# Patient Record
Sex: Female | Born: 1940
Health system: Southern US, Community
[De-identification: ages and names within clinical notes are randomized; demographics above are authoritative.]

## PROBLEM LIST (undated history)

## (undated) DIAGNOSIS — C801 Malignant (primary) neoplasm, unspecified: Secondary | ICD-10-CM

## (undated) DIAGNOSIS — I8390 Asymptomatic varicose veins of unspecified lower extremity: Secondary | ICD-10-CM

## (undated) DIAGNOSIS — F419 Anxiety disorder, unspecified: Secondary | ICD-10-CM

## (undated) DIAGNOSIS — R519 Headache, unspecified: Secondary | ICD-10-CM

## (undated) DIAGNOSIS — D1720 Benign lipomatous neoplasm of skin and subcutaneous tissue of unspecified limb: Secondary | ICD-10-CM

## (undated) DIAGNOSIS — Z789 Other specified health status: Secondary | ICD-10-CM

## (undated) DIAGNOSIS — F32A Depression, unspecified: Secondary | ICD-10-CM

## (undated) HISTORY — PX: WISDOM TOOTH EXTRACTION: SHX21

## (undated) HISTORY — PX: GANGLION CYST EXCISION: SHX1691

## (undated) HISTORY — DX: Asymptomatic varicose veins of unspecified lower extremity: I83.90

## (undated) HISTORY — DX: Benign lipomatous neoplasm of skin and subcutaneous tissue of unspecified limb: D17.20

## (undated) HISTORY — PX: COLONOSCOPY: SHX174

---
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
## 1986-02-07 HISTORY — PX: CHOLECYSTECTOMY: SHX55

## 1997-11-14 ENCOUNTER — Ambulatory Visit (HOSPITAL_COMMUNITY): Admission: RE | Admit: 1997-11-14 | Discharge: 1997-11-14 | Payer: Self-pay | Admitting: Obstetrics & Gynecology

## 2003-08-07 ENCOUNTER — Other Ambulatory Visit: Admission: RE | Admit: 2003-08-07 | Discharge: 2003-08-07 | Payer: Self-pay | Admitting: Internal Medicine

## 2004-07-28 ENCOUNTER — Ambulatory Visit: Payer: Self-pay | Admitting: Internal Medicine

## 2004-07-30 ENCOUNTER — Ambulatory Visit (HOSPITAL_COMMUNITY): Admission: RE | Admit: 2004-07-30 | Discharge: 2004-07-30 | Payer: Self-pay | Admitting: Internal Medicine

## 2004-07-30 ENCOUNTER — Encounter: Payer: Self-pay | Admitting: Cardiology

## 2004-08-09 ENCOUNTER — Ambulatory Visit: Payer: Self-pay | Admitting: Internal Medicine

## 2004-08-12 ENCOUNTER — Ambulatory Visit (HOSPITAL_COMMUNITY): Admission: RE | Admit: 2004-08-12 | Discharge: 2004-08-12 | Payer: Self-pay | Admitting: Internal Medicine

## 2005-01-05 ENCOUNTER — Ambulatory Visit: Payer: Self-pay | Admitting: Internal Medicine

## 2005-08-15 ENCOUNTER — Ambulatory Visit: Payer: Self-pay | Admitting: Internal Medicine

## 2005-12-22 ENCOUNTER — Ambulatory Visit: Payer: Self-pay | Admitting: Internal Medicine

## 2006-03-07 ENCOUNTER — Ambulatory Visit: Payer: Self-pay | Admitting: Internal Medicine

## 2006-08-18 ENCOUNTER — Ambulatory Visit: Payer: Self-pay | Admitting: Internal Medicine

## 2006-08-18 LAB — CONVERTED CEMR LAB
Calcium, Total (PTH): 9.7 mg/dL (ref 8.4–10.5)
PTH: 31.7 pg/mL (ref 14.0–72.0)
Vit D, 1,25-Dihydroxy: 59 — ABNORMAL HIGH (ref 20–57)

## 2006-09-01 DIAGNOSIS — I868 Varicose veins of other specified sites: Secondary | ICD-10-CM | POA: Insufficient documentation

## 2006-09-01 DIAGNOSIS — M810 Age-related osteoporosis without current pathological fracture: Secondary | ICD-10-CM | POA: Insufficient documentation

## 2007-01-09 ENCOUNTER — Ambulatory Visit: Payer: Self-pay | Admitting: Internal Medicine

## 2007-02-14 ENCOUNTER — Ambulatory Visit: Payer: Self-pay | Admitting: Internal Medicine

## 2007-02-14 DIAGNOSIS — E559 Vitamin D deficiency, unspecified: Secondary | ICD-10-CM

## 2007-03-05 ENCOUNTER — Telehealth: Payer: Self-pay | Admitting: Internal Medicine

## 2007-06-21 ENCOUNTER — Encounter: Payer: Self-pay | Admitting: Internal Medicine

## 2007-08-24 ENCOUNTER — Ambulatory Visit: Payer: Self-pay | Admitting: Internal Medicine

## 2007-08-24 DIAGNOSIS — R634 Abnormal weight loss: Secondary | ICD-10-CM | POA: Insufficient documentation

## 2007-11-22 ENCOUNTER — Ambulatory Visit: Payer: Self-pay | Admitting: Internal Medicine

## 2007-11-22 DIAGNOSIS — J0100 Acute maxillary sinusitis, unspecified: Secondary | ICD-10-CM | POA: Insufficient documentation

## 2007-12-10 ENCOUNTER — Ambulatory Visit: Payer: Self-pay | Admitting: Internal Medicine

## 2008-06-12 ENCOUNTER — Ambulatory Visit: Payer: Self-pay | Admitting: Internal Medicine

## 2008-06-12 LAB — CONVERTED CEMR LAB
Basophils Absolute: 0 10*3/uL (ref 0.0–0.1)
Chloride: 105 meq/L (ref 96–112)
Eosinophils Absolute: 0.2 10*3/uL (ref 0.0–0.7)
Eosinophils Relative: 3 % (ref 0.0–5.0)
HCT: 42.2 % (ref 36.0–46.0)
MCHC: 34.3 g/dL (ref 30.0–36.0)
Monocytes Relative: 11.6 % (ref 3.0–12.0)
Neutro Abs: 3 10*3/uL (ref 1.4–7.7)
Neutrophils Relative %: 55.3 % (ref 43.0–77.0)
Platelets: 156 10*3/uL (ref 150.0–400.0)
Potassium: 4.2 meq/L (ref 3.5–5.1)
RDW: 12.2 % (ref 11.5–14.6)
WBC: 5.4 10*3/uL (ref 4.5–10.5)

## 2008-06-19 ENCOUNTER — Ambulatory Visit: Payer: Self-pay | Admitting: Internal Medicine

## 2008-06-27 ENCOUNTER — Encounter: Payer: Self-pay | Admitting: Internal Medicine

## 2008-11-05 ENCOUNTER — Ambulatory Visit: Payer: Self-pay | Admitting: Internal Medicine

## 2008-12-26 ENCOUNTER — Ambulatory Visit: Payer: Self-pay | Admitting: Internal Medicine

## 2008-12-26 DIAGNOSIS — R209 Unspecified disturbances of skin sensation: Secondary | ICD-10-CM | POA: Insufficient documentation

## 2009-01-05 ENCOUNTER — Telehealth: Payer: Self-pay | Admitting: Internal Medicine

## 2009-01-05 ENCOUNTER — Ambulatory Visit: Payer: Self-pay | Admitting: Internal Medicine

## 2009-01-05 ENCOUNTER — Encounter: Payer: Self-pay | Admitting: Internal Medicine

## 2009-01-21 ENCOUNTER — Ambulatory Visit: Payer: Self-pay | Admitting: Internal Medicine

## 2009-02-16 ENCOUNTER — Telehealth: Payer: Self-pay | Admitting: Internal Medicine

## 2009-04-30 ENCOUNTER — Ambulatory Visit: Payer: Self-pay | Admitting: Internal Medicine

## 2009-06-29 ENCOUNTER — Encounter: Payer: Self-pay | Admitting: Internal Medicine

## 2009-10-28 ENCOUNTER — Ambulatory Visit: Payer: Self-pay | Admitting: Internal Medicine

## 2009-10-28 LAB — CONVERTED CEMR LAB
BUN: 19 mg/dL (ref 6–23)
Bilirubin, Direct: 0.1 mg/dL (ref 0.0–0.3)
Calcium: 9.1 mg/dL (ref 8.4–10.5)
Creatinine, Ser: 0.7 mg/dL (ref 0.4–1.2)
Eosinophils Relative: 1.4 % (ref 0.0–5.0)
Glucose, Bld: 76 mg/dL (ref 70–99)
HDL: 55.4 mg/dL (ref >39.00)
Hemoglobin, Urine: NEGATIVE
Ketones, ur: NEGATIVE mg/dL
Lymphs Abs: 1.7 10*3/uL (ref 0.7–4.0)
MCHC: 34 g/dL (ref 30.0–36.0)
MCV: 94.1 fL (ref 78.0–100.0)
Monocytes Absolute: 0.6 10*3/uL (ref 0.1–1.0)
Neutrophils Relative %: 63 % (ref 43.0–77.0)
Nitrite: NEGATIVE
Platelets: 156 10*3/uL (ref 150.0–400.0)
Potassium: 4.1 meq/L (ref 3.5–5.1)
RBC: 4.35 M/uL (ref 3.87–5.11)
Specific Gravity, Urine: 1.02 (ref 1.000–1.030)
TSH: 1.69 microintl units/mL (ref 0.35–5.50)
Total CHOL/HDL Ratio: 4
Triglycerides: 58 mg/dL (ref 0.0–149.0)
Urine Glucose: NEGATIVE mg/dL
Urobilinogen, UA: 0.2 (ref 0.0–1.0)

## 2009-11-04 ENCOUNTER — Ambulatory Visit: Payer: Self-pay | Admitting: Internal Medicine

## 2009-11-04 DIAGNOSIS — D179 Benign lipomatous neoplasm, unspecified: Secondary | ICD-10-CM | POA: Insufficient documentation

## 2009-12-15 ENCOUNTER — Encounter: Payer: Self-pay | Admitting: Internal Medicine

## 2010-03-07 LAB — CONVERTED CEMR LAB
AST: 21 units/L (ref 0–37)
Alkaline Phosphatase: 52 units/L (ref 39–117)
Basophils Relative: 0.8 % (ref 0.0–3.0)
Bilirubin, Direct: 0.1 mg/dL (ref 0.0–0.3)
Chloride: 105 meq/L (ref 96–112)
Eosinophils Absolute: 0.1 10*3/uL (ref 0.0–0.7)
Eosinophils Relative: 0.9 % (ref 0.0–5.0)
GFR calc non Af Amer: 87.9 mL/min (ref >60)
Glucose, Bld: 84 mg/dL (ref 70–99)
HCT: 42.3 % (ref 36.0–46.0)
HDL: 58.5 mg/dL (ref >39.00)
Lymphs Abs: 1.9 10*3/uL (ref 0.7–4.0)
Monocytes Absolute: 0.6 10*3/uL (ref 0.1–1.0)
Monocytes Relative: 10 % (ref 3.0–12.0)
Neutrophils Relative %: 58.3 % (ref 43.0–77.0)
Nitrite: NEGATIVE
Sodium: 142 meq/L (ref 135–145)
Specific Gravity, Urine: 1.025 (ref 1.000–1.030)
Total Protein, Urine: NEGATIVE mg/dL
Total Protein: 7.3 g/dL (ref 6.0–8.3)
Urine Glucose: NEGATIVE mg/dL
Urobilinogen, UA: 0.2 (ref 0.0–1.0)
VLDL: 10.8 mg/dL (ref 0.0–40.0)

## 2010-03-11 NOTE — Progress Notes (Signed)
Summary: ACTONEL  Phone Note Call from Patient   Summary of Call: Patient is requesting to continue actonel 35mg  once wkly and then start actonel 150mg  once mthly.  Initial call taken by: Lamar Sprinkles, CMA,  February 16, 2009 11:50 AM  Follow-up for Phone Call        OK Follow-up by: Tresa Garter MD,  February 16, 2009 12:08 PM    New/Updated Medications: ACTONEL 35 MG TABS (RISEDRONATE SODIUM) 1 qwk x 2 wks then start actonel 150 Prescriptions: ACTONEL 35 MG TABS (RISEDRONATE SODIUM) 1 qwk x 2 wks then start actonel 150  #2 x 0   Entered by:   Lamar Sprinkles, CMA   Authorized by:   Tresa Garter MD   Signed by:   Lamar Sprinkles, CMA on 02/16/2009   Method used:   Electronically to        [facility]. [phone]* (retail)       [phone]       [place], [place]  [address]       Ph: [phone]       Fax: [phone]   RxID:   [id number] ACTONEL 150 MG TABS (RISEDRONATE SODIUM) 1 by mouth q 1 month for osteoporosis  #3 x 3   Entered by:   Lamar Sprinkles, CMA   Authorized by:   Tresa Garter MD   Signed by:   Lamar Sprinkles, CMA on 02/16/2009   Method used:   Electronically to        [facility]. [phone]* (retail)       [phone]       [place], [place]  [address]       Ph: [phone]       Fax: [phone]   RxID:   [id number]

## 2010-03-11 NOTE — Letter (Signed)
Summary: Consuela Mimes MD  Consuela Mimes MD   Imported By: Sherian Rein 01/25/2010 12:38:32  _____________________________________________________________________  External Attachment:    Type:   Image     Comment:   External Document

## 2010-03-11 NOTE — Assessment & Plan Note (Signed)
Summary: RS BUMP--PT RET FROM [facility] 04/23/09  D/T  STC   Vital Signs:  Patient profile:   70 year old female Weight:      114 pounds BMI:     19.64 Temp:     97 degrees F oral Pulse rate:   82 / minute BP sitting:   118 / 86  (left arm)  Vitals Entered By: Tora Perches (April 30, 2009 8:03 AM) CC: f/u Is Patient Diabetic? No   CC:  f/u.  History of Present Illness: F/u osteoporosis, Vit D def  Preventive Screening-Counseling & Management  Alcohol-Tobacco     Smoking Status: never  Current Medications (verified): 1)  Vitamin D3 1000 Unit  Tabs (Cholecalciferol) .... 2 By Mouth Daily 2)  Actonel 150 Mg Tabs (Risedronate Sodium) .Marland Kitchen.. 1 By Mouth Q 1 Month For Osteoporosis 3)  Prascion Ra 10-5 % Crea (Sulfacetamide-Sulfur-Sunscreen)  Allergies: 1)  ! Fosamax (Alendronate Sodium) 2)  ! Actonel (Risedronate Sodium)  Past History:  Past Medical History: Last updated: 12/26/2008 Osteoporosis Vit D def Varic. veins [facility] in [place] for colonoscopy  Social History: Last updated: 11/22/2007 Married Never Smoked Alcohol use-no Drug use-no Regular exercise-yes  Physical Exam  General:  Well-developed,well-nourished,in no acute distress; alert,appropriate and cooperative throughout examination Mouth:  Oral mucosa and oropharynx without lesions or exudates.  Teeth in good repair. Lungs:  Normal respiratory effort, chest expands symmetrically. Lungs are clear to auscultation, no crackles or wheezes. Heart:  Normal rate and regular rhythm. S1 and S2 normal without gallop, murmur, click, rub or other extra sounds. Extremities:  No clubbing, cyanosis, edema, or deformity noted with normal full range of motion of all joints.   Neurologic:  No cranial nerve deficits noted. Station and gait are normal. Plantar reflexes are down-going bilaterally. DTRs are symmetrical throughout. Sensory, motor and coordinative functions appear intact. Skin:  Intact without suspicious  lesions or rashes Psych:  Cognition and judgment appear intact. Alert and cooperative with normal attention span and concentration. No apparent delusions, illusions, hallucinations   Impression & Recommendations:  Problem # 1:  OSTEOPOROSIS (ICD-733.00) Assessment Comment Only  Her updated medication list for this problem includes:    Actonel 150 Mg Tabs (Risedronate sodium) .Marland Kitchen... 1 by mouth q 1 month for osteoporosis  Problem # 2:  VITAMIN D DEFICIENCY (ICD-268.9) Assessment: Comment Only On prescription therapy   Complete Medication List: 1)  Vitamin D3 1000 Unit Tabs (Cholecalciferol) .... 2 by mouth daily 2)  Actonel 150 Mg Tabs (Risedronate sodium) .Marland Kitchen.. 1 by mouth q 1 month for osteoporosis 3)  Prascion Ra 10-5 % Crea (Sulfacetamide-sulfur-sunscreen)  Patient Instructions: 1)  Please schedule a follow-up appointment in 6 months well w/labs. Prescriptions: ACTONEL 150 MG TABS (RISEDRONATE SODIUM) 1 by mouth q 1 month for osteoporosis  #1 x 12   Entered and Authorized by:   Tresa Garter MD   Signed by:   Tresa Garter MD on 04/30/2009   Method used:   Electronically to        [facility]. [phone]* (retail)       [phone]       [place], [place]  [address]       Ph: [phone]       Fax: [phone]   RxID:   [id number]

## 2010-03-11 NOTE — Assessment & Plan Note (Signed)
Summary: 6 MO ROV /NWS  #--PER PT FLU VAC ALSO--STC   Vital Signs:  Patient profile:   70 year old female Height:      64 inches Weight:      114 pounds BMI:     19.64 Temp:     98.4 degrees F oral Pulse rate:   84 / minute Pulse rhythm:   regular Resp:     16 per minute BP sitting:   110 / 62  (left arm) Cuff size:   regular  Vitals Entered By: Lanier Prude, CMA(AAMA) (November 04, 2009 9:01 AM) CC: 6 mo f/u Is Patient Diabetic? No   CC:  6 mo f/u.  History of Present Illness: The patient presents for a follow up of osteoporosis The patient presents for a preventive health examination  Patient past medical history, social history, and family history reviewed in detail no significant changes.  Patient is physically active. Depression is negative and mood is good. Hearing is normal, and able to perform activities of daily living. Risk of falling is negligible and home safety has been reviewed and is appropriate. Patient has normal height, weight, and visual acuity. Patient has been counseled on age-appropriate routine health concerns for screening and prevention. Education, counseling done.   Preventive Screening-Counseling & Management  Alcohol-Tobacco     Alcohol drinks/day: 0     Smoking Status: never     Smoking Cessation Counseling: no  Caffeine-Diet-Exercise     Caffeine Counseling: not indicated; caffeine use is not excessive or problematic     Does Patient Exercise: no  Hep-HIV-STD-Contraception     Hepatitis Risk: no risk noted     Sun Exposure-Excessive: no  Safety-Violence-Falls     Seat Belt Use: no     Fall Risk Counseling: not indicated; no significant falls noted      Sexual History:  currently monogamous.    Current Medications (verified): 1)  Vitamin D3 1000 Unit  Tabs (Cholecalciferol) .... 2 By Mouth Daily 2)  Actonel 150 Mg Tabs (Risedronate Sodium) .Marland Kitchen.. 1 By Mouth Q 1 Month For Osteoporosis 3)  Prascion Ra 10-5 % Crea  (Sulfacetamide-Sulfur-Sunscreen) 4)  Ocuvite-Lutein  Caps (Multiple Vitamins-Minerals) .Marland Kitchen.. 1 Every Morning 5)  Caltrate 600+d 600-400 Mg-Unit Tabs (Calcium Carbonate-Vitamin D) .Marland Kitchen.. 1 Once Daily  Allergies (verified): 1)  ! Fosamax (Alendronate Sodium) 2)  Actonel (Risedronate Sodium)  Past History:  Family History: Last updated: 11/04/2009 Varic veins M is 39  Social History: Last updated: 11/04/2009 Married Never Smoked Alcohol use-no Drug use-no Regular exercise-no  Past Medical History: Osteoporosis Vit D def Varic. veins [facility] in [place] for colonoscopy R arm lipoma  Past Surgical History: Reviewed history from 11/22/2007 and no changes required. Denies surgical history  Family History: Varic veins M is 95  Social History: Reviewed history from 11/22/2007 and no changes required. Married Never Smoked Alcohol use-no Drug use-no Regular exercise-no Does Patient Exercise:  no Hepatitis Risk:  no risk noted Sun Exposure-Excessive:  no Seat Belt Use:  no Sexual History:  currently monogamous  Review of Systems  The patient denies fever, chest pain, and dyspnea on exertion.    Physical Exam  Msk:  R mid arm lat 4 cm subcutaneouse mass   Impression & Recommendations:  Problem # 1:  PHYSICAL EXAMINATION (ICD-V70.0) Assessment New  The labs were reviewed with the patient.  Overall doing well, age appropriate education and counseling updated and referral for appropriate preventive services done unless declined, immunizations up to  date or declined, diet counseling done if overweight, urged to quit smoking if smokes, most recent labs reviewed and current ordered if appropriate, ecg reviewed or declined (interpretation per ECG scanned in the EMR if done); information regarding Medicare Preventation requirements given if appropriate.  Colon in [place] is pending  dT is up to date per pt GYN, Opht exam in [place] is pending   Orders: Medicare -1st Annual  Wellness Visit [phone])  Problem # 2:  OSTEOPOROSIS (ICD-733.00) Assessment: Unchanged  The following medications were removed from the medication list:    Actonel 150 Mg Tabs (Risedronate sodium) .Marland Kitchen... 1 by mouth q 1 month for osteoporosis Her updated medication list for this problem includes:    Atelvia 35 Mg Tbec (Risedronate sodium) .Marland Kitchen... 1 by mouth q1wk as dirr  Problem # 3:  LIPOMA (ICD-214.9) R arm Assessment: New Portable US done and c/w lipoma  Complete Medication List: 1)  Vitamin D3 1000 Unit Tabs (Cholecalciferol) .... 2 by mouth daily 2)  Prascion Ra 10-5 % Crea (Sulfacetamide-sulfur-sunscreen) 3)  Ocuvite-lutein Caps (Multiple vitamins-minerals) .Marland Kitchen.. 1 every morning 4)  Caltrate 600+d 600-400 Mg-unit Tabs (Calcium carbonate-vitamin d) .Marland Kitchen.. 1 once daily 5)  Atelvia 35 Mg Tbec (Risedronate sodium) .Marland Kitchen.. 1 by mouth q1wk as dirr  Other Orders: Flu Vaccine 20yrs + MEDICARE PATIENTS (U0454) Administration Flu vaccine - MCR (U9811)  Patient Instructions: 1)  Please schedule a follow-up appointment in 6 months  Prescriptions: ATELVIA 35 MG TBEC (RISEDRONATE SODIUM) 1 by mouth q1wk as dirr  #4 x 11   Entered and Authorized by:   Tresa Garter MD   Signed by:   Tresa Garter MD on 11/04/2009   Method used:   Print then Give to Patient   RxID:   [id number]  [email or web address]   Flu Vaccine Consent Questions     Do you have a history of severe allergic reactions to this vaccine? no    Any prior history of allergic reactions to egg and/or gelatin? no    Do you have a sensitivity to the preservative Thimersol? no    Do you have a past history of Guillan-Barre Syndrome? no    Do you currently have an acute febrile illness? no    Have you ever had a severe reaction to latex? no    Vaccine information given and explained to patient? yes    Are you currently pregnant? no    Lot Number:[id number]   Exp Date:08/07/2010   Site Given  Left Deltoid IM Lanier Prude,  [facility])  November 04, 2009 9:08 AM

## 2010-03-11 NOTE — Consult Note (Signed)
Summary: [facility]  [facility]   Imported By: Sherian Rein 07/09/2009 12:33:03  _____________________________________________________________________  External Attachment:    Type:   Image     Comment:   External Document

## 2010-03-11 NOTE — Letter (Signed)
Summary: Generic Letter  [facility]  [address] [place], [place] [address]   Phone: [phone]  Fax: [phone]    04/30/2009  RE: Tanya Walker [address] [place], [place]  [address]  Tanya Walker is healthy for weight lifting and other activities.           Sincerely,   Jacinta Shoe MD

## 2010-05-06 ENCOUNTER — Ambulatory Visit: Payer: Self-pay | Admitting: Internal Medicine

## 2010-06-25 NOTE — Assessment & Plan Note (Signed)
[facility]                             PRIMARY CARE OFFICE NOTE   Tanya Walker, Tanya Walker                         MRN:          [id number]  DATE:08/16/2005                            DOB:          [birth year]    CHIEF COMPLAINT:  The patient is a 70 year old female who presents for a  wellness examination.   PAST MEDICAL HISTORY/SOCIAL HISTORY/FAMILY HISTORY:  As per the August 07, 2003, note.   ALLERGIES:  No known drug allergies.   MEDICINES:  Reviewed.   REVIEW OF SYSTEMS:  No chest pain or shortness of breath.  Occasional  headaches, infrequent.  No blood in the stool.  The rest is negative.   PHYSICAL EXAMINATION:  VITAL SIGNS:  Blood pressure 117/70, pulse 71,  temperature 99.3 degrees, weight 116 pounds.  GENERAL:  Looks well.  HEENT:  Moist mucosa.  NECK:  Supple, no thyromegaly or bruit.  LUNGS:  Clear, no wheezes or rales.  HEART:  Normal sounds x2,  no murmur or gallop.  ABDOMEN:  Soft, nontender, no organomegaly, no mass.  EXTREMITIES:  Lower extremities without edema.  NEUROLOGIC:  She is alert, oriented and cooperative.  Denies being  depressed.  Head is without deformities.  No pulsating vessels.  SKIN:  No rash.   LABORATORY DATA:  Electrocardiogram today with a normal sinus rhythm.   A pelvic ultrasound was done in July 2006.   Colonoscopy done in 2004.   She had a chest x-ray done in 2006.   ASSESSMENT/PLAN:  1.  Normal wellness examination, Age/health related issues discussed,      healthy lifestyle  discussed:  I do not see any report of a      Papanicolaou's smear.  Will may need to obtain a pelvic ultrasound in      about one year.  The electrocardiogram looks fine.  Given Pneumovax.      Information about Zostavax provided.  She was advised to take calcium      and vitamin D.  She is not interested in more substantial therapy for      osteoporosis or a bone density scan.  2.  Headaches, likely migraines:  Obtain  blood work including sedimentation      rate.  She will use Advil or Excedrin p.r.n.                                   Sonda Primes, MD   AP/MedQ  DD:  08/16/2005  DT:  08/16/2005  Job #:  [id number]

## 2010-07-08 ENCOUNTER — Encounter: Payer: Self-pay | Admitting: Internal Medicine

## 2011-03-08 DIAGNOSIS — Z23 Encounter for immunization: Secondary | ICD-10-CM | POA: Diagnosis not present

## 2011-04-19 DIAGNOSIS — L719 Rosacea, unspecified: Secondary | ICD-10-CM | POA: Diagnosis not present

## 2011-04-19 DIAGNOSIS — L821 Other seborrheic keratosis: Secondary | ICD-10-CM | POA: Diagnosis not present

## 2011-04-19 DIAGNOSIS — D239 Other benign neoplasm of skin, unspecified: Secondary | ICD-10-CM | POA: Diagnosis not present

## 2011-04-19 DIAGNOSIS — I789 Disease of capillaries, unspecified: Secondary | ICD-10-CM | POA: Diagnosis not present

## 2011-04-19 DIAGNOSIS — Z85828 Personal history of other malignant neoplasm of skin: Secondary | ICD-10-CM | POA: Diagnosis not present

## 2011-04-19 DIAGNOSIS — L219 Seborrheic dermatitis, unspecified: Secondary | ICD-10-CM | POA: Diagnosis not present

## 2011-07-05 DIAGNOSIS — L259 Unspecified contact dermatitis, unspecified cause: Secondary | ICD-10-CM | POA: Diagnosis not present

## 2011-07-05 DIAGNOSIS — Z411 Encounter for cosmetic surgery: Secondary | ICD-10-CM | POA: Diagnosis not present

## 2011-07-08 DIAGNOSIS — Z1231 Encounter for screening mammogram for malignant neoplasm of breast: Secondary | ICD-10-CM | POA: Diagnosis not present

## 2011-11-16 DIAGNOSIS — Z23 Encounter for immunization: Secondary | ICD-10-CM | POA: Diagnosis not present

## 2011-11-23 ENCOUNTER — Ambulatory Visit: Payer: Self-pay

## 2011-12-30 DIAGNOSIS — H5200 Hypermetropia, unspecified eye: Secondary | ICD-10-CM | POA: Diagnosis not present

## 2011-12-30 DIAGNOSIS — H3550 Unspecified hereditary retinal dystrophy: Secondary | ICD-10-CM | POA: Diagnosis not present

## 2011-12-30 DIAGNOSIS — H2510 Age-related nuclear cataract, unspecified eye: Secondary | ICD-10-CM | POA: Diagnosis not present

## 2011-12-30 DIAGNOSIS — G43119 Migraine with aura, intractable, without status migrainosus: Secondary | ICD-10-CM | POA: Diagnosis not present

## 2012-03-08 ENCOUNTER — Ambulatory Visit (INDEPENDENT_AMBULATORY_CARE_PROVIDER_SITE_OTHER): Payer: Medicare Other | Admitting: Internal Medicine

## 2012-03-08 ENCOUNTER — Encounter: Payer: Self-pay | Admitting: Internal Medicine

## 2012-03-08 VITALS — BP 120/70 | HR 76 | Temp 99.0°F | Resp 16 | Wt 113.0 lb

## 2012-03-08 DIAGNOSIS — J069 Acute upper respiratory infection, unspecified: Secondary | ICD-10-CM | POA: Insufficient documentation

## 2012-03-08 MED ORDER — AMOXICILLIN 500 MG PO CAPS: 1000.0000 mg | ORAL_CAPSULE | Freq: Two times a day (BID) | ORAL | Status: DC

## 2012-03-08 MED ORDER — PROMETHAZINE-CODEINE 6.25-10 MG/5ML PO SYRP: 5.0000 mL | ORAL_SOLUTION | ORAL | Status: AC | PRN

## 2012-03-08 NOTE — Assessment & Plan Note (Signed)
1/14 sinusitis and bronchitis Amoxicillin 1g bid x 10d Prom-cod syr prn

## 2012-03-08 NOTE — Progress Notes (Signed)
  Subjective:    Patient ID: Tanya Walker, female    DOB: 1940/08/25, 72 y.o.   MRN: [id number]  HPI  C/o URI sx's x 1 mo; cough, nasal d/c  Review of Systems  Constitutional: Negative for chills.  HENT: Positive for congestion and rhinorrhea.   Eyes: Negative for itching.  Respiratory: Positive for cough.   Genitourinary: Negative for dysuria and flank pain.  Neurological: Negative for headaches.  Psychiatric/Behavioral: Negative for dysphoric mood.       Objective:   Physical Exam  Constitutional: She appears well-developed. No distress.  HENT:  Head: Normocephalic.  Right Ear: External ear normal.  Left Ear: External ear normal.  Nose: Nose normal.  Mouth/Throat: No oropharyngeal exudate.       eryth nasal lining  Eyes: Conjunctivae normal are normal. Pupils are equal, round, and reactive to light. Right eye exhibits no discharge. Left eye exhibits no discharge.  Neck: Normal range of motion. Neck supple. No JVD present. No tracheal deviation present. No thyromegaly present.  Cardiovascular: Normal rate, regular rhythm and normal heart sounds.   Pulmonary/Chest: No stridor. No respiratory distress. She has no wheezes.  Abdominal: Soft. Bowel sounds are normal. She exhibits no distension and no mass. There is no tenderness. There is no rebound and no guarding.  Musculoskeletal: She exhibits no edema and no tenderness.  Lymphadenopathy:    She has no cervical adenopathy.  Neurological: She displays normal reflexes. No cranial nerve deficit. She exhibits normal muscle tone. Coordination normal.  Skin: No rash noted. No erythema.  Psychiatric: She has a normal mood and affect. Her behavior is normal. Judgment and thought content normal.          Assessment & Plan:

## 2012-03-08 NOTE — Progress Notes (Signed)
Patient ID: Tanya Walker, female   DOB: 1940/08/04, 72 y.o.   MRN: [id number]

## 2012-04-19 DIAGNOSIS — L719 Rosacea, unspecified: Secondary | ICD-10-CM | POA: Diagnosis not present

## 2012-04-19 DIAGNOSIS — L570 Actinic keratosis: Secondary | ICD-10-CM | POA: Diagnosis not present

## 2012-07-09 DIAGNOSIS — Z1231 Encounter for screening mammogram for malignant neoplasm of breast: Secondary | ICD-10-CM | POA: Diagnosis not present

## 2012-09-19 DIAGNOSIS — H3530 Unspecified macular degeneration: Secondary | ICD-10-CM | POA: Diagnosis not present

## 2012-09-19 DIAGNOSIS — H5200 Hypermetropia, unspecified eye: Secondary | ICD-10-CM | POA: Diagnosis not present

## 2012-09-19 DIAGNOSIS — H40059 Ocular hypertension, unspecified eye: Secondary | ICD-10-CM | POA: Diagnosis not present

## 2012-09-19 DIAGNOSIS — H2510 Age-related nuclear cataract, unspecified eye: Secondary | ICD-10-CM | POA: Diagnosis not present

## 2012-10-04 DIAGNOSIS — D126 Benign neoplasm of colon, unspecified: Secondary | ICD-10-CM | POA: Diagnosis not present

## 2012-10-04 DIAGNOSIS — K5730 Diverticulosis of large intestine without perforation or abscess without bleeding: Secondary | ICD-10-CM | POA: Diagnosis not present

## 2012-10-04 DIAGNOSIS — Z1211 Encounter for screening for malignant neoplasm of colon: Secondary | ICD-10-CM | POA: Diagnosis not present

## 2012-10-30 DIAGNOSIS — Z23 Encounter for immunization: Secondary | ICD-10-CM | POA: Diagnosis not present

## 2012-12-24 ENCOUNTER — Ambulatory Visit (INDEPENDENT_AMBULATORY_CARE_PROVIDER_SITE_OTHER): Payer: Medicare Other

## 2012-12-24 DIAGNOSIS — Z23 Encounter for immunization: Secondary | ICD-10-CM

## 2013-01-08 DIAGNOSIS — Z124 Encounter for screening for malignant neoplasm of cervix: Secondary | ICD-10-CM | POA: Diagnosis not present

## 2013-01-08 DIAGNOSIS — N950 Postmenopausal bleeding: Secondary | ICD-10-CM | POA: Diagnosis not present

## 2013-01-11 DIAGNOSIS — N950 Postmenopausal bleeding: Secondary | ICD-10-CM | POA: Diagnosis not present

## 2013-01-14 DIAGNOSIS — N882 Stricture and stenosis of cervix uteri: Secondary | ICD-10-CM | POA: Diagnosis not present

## 2013-01-14 DIAGNOSIS — N950 Postmenopausal bleeding: Secondary | ICD-10-CM | POA: Diagnosis not present

## 2013-01-15 DIAGNOSIS — N950 Postmenopausal bleeding: Secondary | ICD-10-CM | POA: Diagnosis not present

## 2013-02-26 ENCOUNTER — Encounter: Payer: Self-pay | Admitting: Internal Medicine

## 2013-02-26 ENCOUNTER — Ambulatory Visit (INDEPENDENT_AMBULATORY_CARE_PROVIDER_SITE_OTHER): Payer: Medicare Other | Admitting: Internal Medicine

## 2013-02-26 VITALS — BP 120/72 | HR 72 | Temp 98.4°F | Resp 16 | Wt 113.0 lb

## 2013-02-26 DIAGNOSIS — M810 Age-related osteoporosis without current pathological fracture: Secondary | ICD-10-CM | POA: Diagnosis not present

## 2013-02-26 DIAGNOSIS — J069 Acute upper respiratory infection, unspecified: Secondary | ICD-10-CM | POA: Diagnosis not present

## 2013-02-26 MED ORDER — AZITHROMYCIN 250 MG PO TABS: ORAL_TABLET | ORAL | Status: DC

## 2013-02-26 NOTE — Assessment & Plan Note (Signed)
BDS Ca and Vit D

## 2013-02-26 NOTE — Progress Notes (Signed)
Pre visit review using our clinic review tool, if applicable. No additional management support is needed unless otherwise documented below in the visit note. 

## 2013-02-26 NOTE — Assessment & Plan Note (Signed)
Continue with current prescription therapy as reflected on the Med list.  

## 2013-02-26 NOTE — Progress Notes (Signed)
   Subjective:    HPI  C/o URI sx's x 10 d; cough, nasal d/c - better  Review of Systems  Constitutional: Negative for chills.  HENT: Positive for congestion and rhinorrhea.   Eyes: Negative for itching.  Respiratory: Positive for cough.   Genitourinary: Negative for dysuria and flank pain.  Neurological: Negative for headaches.  Psychiatric/Behavioral: Negative for dysphoric mood.       Objective:   Physical Exam  Constitutional: She appears well-developed. No distress.  HENT:  Head: Normocephalic.  Right Ear: External ear normal.  Left Ear: External ear normal.  Nose: Nose normal.  Mouth/Throat: No oropharyngeal exudate.  eryth nasal lining  Eyes: Conjunctivae are normal. Pupils are equal, round, and reactive to light. Right eye exhibits no discharge. Left eye exhibits no discharge.  Neck: Normal range of motion. Neck supple. No JVD present. No tracheal deviation present. No thyromegaly present.  Cardiovascular: Normal rate, regular rhythm and normal heart sounds.   Pulmonary/Chest: No stridor. No respiratory distress. She has no wheezes.  Abdominal: Soft. Bowel sounds are normal. She exhibits no distension and no mass. There is no tenderness. There is no rebound and no guarding.  Musculoskeletal: She exhibits no edema and no tenderness.  Lymphadenopathy:    She has no cervical adenopathy.  Neurological: She displays normal reflexes. No cranial nerve deficit. She exhibits normal muscle tone. Coordination normal.  Skin: No rash noted. No erythema.  Psychiatric: She has a normal mood and affect. Her behavior is normal. Judgment and thought content normal.          Assessment & Plan:

## 2013-03-01 DIAGNOSIS — H5200 Hypermetropia, unspecified eye: Secondary | ICD-10-CM | POA: Diagnosis not present

## 2013-03-01 DIAGNOSIS — H3550 Unspecified hereditary retinal dystrophy: Secondary | ICD-10-CM | POA: Diagnosis not present

## 2013-03-01 DIAGNOSIS — H2510 Age-related nuclear cataract, unspecified eye: Secondary | ICD-10-CM | POA: Diagnosis not present

## 2013-03-15 ENCOUNTER — Ambulatory Visit (INDEPENDENT_AMBULATORY_CARE_PROVIDER_SITE_OTHER)
Admission: RE | Admit: 2013-03-15 | Discharge: 2013-03-15 | Disposition: A | Discharge status: Home / Self Care | Payer: Medicare Other | Source: Ambulatory Visit | Attending: Internal Medicine | Admitting: Internal Medicine

## 2013-03-15 DIAGNOSIS — M810 Age-related osteoporosis without current pathological fracture: Secondary | ICD-10-CM

## 2013-04-01 ENCOUNTER — Telehealth: Payer: Self-pay | Admitting: *Deleted

## 2013-04-01 NOTE — Telephone Encounter (Signed)
Patient phoned stating she had received a dvd of recent bone density scan & needed to ask if she needs to schedule a f/u appt with PCP.  Please advise.  CB# [phone]

## 2013-04-03 NOTE — Telephone Encounter (Signed)
Yes pls Thx 

## 2013-04-05 NOTE — Telephone Encounter (Signed)
Notified patient of MD response and she is going to bring DVD to next appt which she is going to schedule.

## 2013-04-24 ENCOUNTER — Encounter: Payer: Self-pay | Admitting: Internal Medicine

## 2013-04-24 ENCOUNTER — Ambulatory Visit (INDEPENDENT_AMBULATORY_CARE_PROVIDER_SITE_OTHER): Payer: Medicare Other | Admitting: Internal Medicine

## 2013-04-24 VITALS — BP 118/80 | HR 80 | Temp 98.2°F | Resp 16 | Wt 109.0 lb

## 2013-04-24 DIAGNOSIS — M810 Age-related osteoporosis without current pathological fracture: Secondary | ICD-10-CM | POA: Diagnosis not present

## 2013-04-24 NOTE — Assessment & Plan Note (Signed)
Cont w/Vit D Options discussed - Prolia and Reclast

## 2013-04-24 NOTE — Progress Notes (Signed)
   Subjective:    HPI  F/u osteoporosis, discuss BDS  Review of Systems  Constitutional: Negative for chills.  HENT: Positive for congestion and rhinorrhea.   Eyes: Negative for itching.  Respiratory: Positive for cough.   Genitourinary: Negative for dysuria and flank pain.  Neurological: Negative for headaches.  Psychiatric/Behavioral: Negative for dysphoric mood.       Objective:   Physical Exam  Constitutional: She appears well-developed. No distress.  HENT:  Head: Normocephalic.  Right Ear: External ear normal.  Left Ear: External ear normal.  Nose: Nose normal.  Mouth/Throat: No oropharyngeal exudate.  eryth nasal lining  Eyes: Conjunctivae are normal. Pupils are equal, round, and reactive to light. Right eye exhibits no discharge. Left eye exhibits no discharge.  Neck: Normal range of motion. Neck supple. No JVD present. No tracheal deviation present. No thyromegaly present.  Cardiovascular: Normal rate, regular rhythm and normal heart sounds.   Pulmonary/Chest: No stridor. No respiratory distress. She has no wheezes.  Abdominal: Soft. Bowel sounds are normal. She exhibits no distension and no mass. There is no tenderness. There is no rebound and no guarding.  Musculoskeletal: She exhibits no edema and no tenderness.  Lymphadenopathy:    She has no cervical adenopathy.  Neurological: She displays normal reflexes. No cranial nerve deficit. She exhibits normal muscle tone. Coordination normal.  Skin: No rash noted. No erythema.  Psychiatric: She has a normal mood and affect. Her behavior is normal. Judgment and thought content normal.    BDS  >20 min discussion    Assessment & Plan:

## 2013-04-24 NOTE — Patient Instructions (Signed)
Yoga is good for you!

## 2013-04-24 NOTE — Progress Notes (Signed)
Pre visit review using our clinic review tool, if applicable. No additional management support is needed unless otherwise documented below in the visit note. 

## 2013-05-28 ENCOUNTER — Ambulatory Visit: Payer: Medicare Other

## 2013-06-10 ENCOUNTER — Encounter: Payer: Self-pay | Admitting: Internal Medicine

## 2013-06-10 ENCOUNTER — Ambulatory Visit (INDEPENDENT_AMBULATORY_CARE_PROVIDER_SITE_OTHER): Payer: Medicare Other | Admitting: Internal Medicine

## 2013-06-10 VITALS — BP 130/80 | HR 68 | Temp 99.2°F | Resp 16 | Wt 108.0 lb

## 2013-06-10 DIAGNOSIS — R21 Rash and other nonspecific skin eruption: Secondary | ICD-10-CM | POA: Diagnosis not present

## 2013-06-10 MED ORDER — TRIAMCINOLONE ACETONIDE 0.5 % EX CREA: 1.0000 "application " | TOPICAL_CREAM | Freq: Four times a day (QID) | CUTANEOUS | Status: DC

## 2013-06-10 NOTE — Progress Notes (Signed)
Pre visit review using our clinic review tool, if applicable. No additional management support is needed unless otherwise documented below in the visit note. 

## 2013-06-10 NOTE — Progress Notes (Signed)
   Subjective:    Patient ID: Tanya Walker, female    DOB: 1940-06-21, 73 y.o.   MRN: [id number]  HPI  C/o reaction due to H zoster shot last wk at the drug store  Review of Systems  Constitutional: Negative for fever, chills and diaphoresis.  Musculoskeletal: Negative for arthralgias.  Skin: Positive for rash.       Objective:   Physical Exam  Constitutional: She appears well-developed and well-nourished.  Cardiovascular: Exam reveals no gallop.   Pulmonary/Chest: She has no wheezes.  Skin: Rash noted. There is erythema.  Psychiatric: She has a normal mood and affect.     R dist post arm 4 cm eryth cool to touch papule     Assessment & Plan:

## 2013-06-10 NOTE — Assessment & Plan Note (Signed)
R dist post arm 4 cm eryth cool to touch papule Triamc cream tid rx Better

## 2013-06-11 ENCOUNTER — Encounter: Payer: Self-pay | Admitting: Internal Medicine

## 2013-06-17 DIAGNOSIS — N950 Postmenopausal bleeding: Secondary | ICD-10-CM | POA: Diagnosis not present

## 2013-06-17 DIAGNOSIS — N952 Postmenopausal atrophic vaginitis: Secondary | ICD-10-CM | POA: Diagnosis not present

## 2013-07-02 ENCOUNTER — Other Ambulatory Visit: Payer: Self-pay | Admitting: Obstetrics and Gynecology

## 2013-07-05 ENCOUNTER — Encounter (HOSPITAL_COMMUNITY): Payer: Self-pay | Admitting: Pharmacist

## 2013-07-11 DIAGNOSIS — Z1231 Encounter for screening mammogram for malignant neoplasm of breast: Secondary | ICD-10-CM | POA: Diagnosis not present

## 2013-07-12 ENCOUNTER — Other Ambulatory Visit: Payer: Self-pay

## 2013-07-12 ENCOUNTER — Encounter (HOSPITAL_COMMUNITY): Payer: Self-pay

## 2013-07-12 ENCOUNTER — Encounter (HOSPITAL_COMMUNITY)
Admission: RE | Admit: 2013-07-12 | Discharge: 2013-07-12 | Disposition: A | Discharge status: Home / Self Care | Payer: Medicare Other | Source: Ambulatory Visit | Attending: Obstetrics and Gynecology | Admitting: Obstetrics and Gynecology

## 2013-07-12 DIAGNOSIS — R9389 Abnormal findings on diagnostic imaging of other specified body structures: Secondary | ICD-10-CM | POA: Diagnosis not present

## 2013-07-12 DIAGNOSIS — N950 Postmenopausal bleeding: Secondary | ICD-10-CM | POA: Diagnosis not present

## 2013-07-12 HISTORY — DX: Other specified health status: Z78.9

## 2013-07-12 HISTORY — DX: Malignant (primary) neoplasm, unspecified: C80.1

## 2013-07-12 LAB — BASIC METABOLIC PANEL
BUN: 14 mg/dL (ref 6–23)
CHLORIDE: 101 meq/L (ref 96–112)
CO2: 30 meq/L (ref 19–32)
CREATININE: 0.75 mg/dL (ref 0.50–1.10)
Calcium: 9.4 mg/dL (ref 8.4–10.5)
GFR, EST NON AFRICAN AMERICAN: 81 mL/min — AB (ref >90)
GLUCOSE: 98 mg/dL (ref 70–99)
POTASSIUM: 4.1 meq/L (ref 3.7–5.3)
SODIUM: 140 meq/L (ref 137–147)

## 2013-07-12 LAB — CBC
HCT: 41.9 % (ref 36.0–46.0)
Hemoglobin: 14 g/dL (ref 12.0–15.0)
MCH: 31.3 pg (ref 26.0–34.0)
MCHC: 33.4 g/dL (ref 30.0–36.0)
MCV: 93.7 fL (ref 78.0–100.0)
Platelets: 154 10*3/uL (ref 150–400)
RBC: 4.47 MIL/uL (ref 3.87–5.11)
RDW: 13.2 % (ref 11.5–15.5)
WBC: 6.9 10*3/uL (ref 4.0–10.5)

## 2013-07-12 NOTE — Patient Instructions (Signed)
Your procedure is scheduled on:07/15/13  Enter through the Main Entrance at :0930 am Pick up desk phone and dial [phone] and inform us of your arrival.  Please call [phone] if you have any problems the morning of surgery.  Remember: Do not eat food or drink liquids, including water, after midnight:Sunday   You may brush your teeth the morning of surgery.  Take these meds the morning of surgery with a sip of water: none  DO NOT wear jewelry, eye make-up, lipstick,body lotion, or dark fingernail polish.  (Polished toes are ok) You may wear deodorant.   Patients discharged on the day of surgery will not be allowed to drive home. Wear loose fitting, comfortable clothes for your ride home.

## 2013-07-15 ENCOUNTER — Encounter (HOSPITAL_COMMUNITY): Payer: Self-pay | Admitting: *Deleted

## 2013-07-15 ENCOUNTER — Ambulatory Visit (HOSPITAL_COMMUNITY): Payer: Medicare Other | Admitting: Anesthesiology

## 2013-07-15 ENCOUNTER — Ambulatory Visit (HOSPITAL_COMMUNITY)
Admission: RE | Admit: 2013-07-15 | Discharge: 2013-07-15 | Disposition: A | Discharge status: Home / Self Care | Payer: Medicare Other | Source: Ambulatory Visit | Attending: Obstetrics and Gynecology | Admitting: Obstetrics and Gynecology

## 2013-07-15 ENCOUNTER — Encounter (HOSPITAL_COMMUNITY)
Admission: RE | Disposition: A | Discharge status: Home / Self Care | Payer: Self-pay | Source: Ambulatory Visit | Attending: Obstetrics and Gynecology

## 2013-07-15 DIAGNOSIS — N950 Postmenopausal bleeding: Secondary | ICD-10-CM | POA: Insufficient documentation

## 2013-07-15 DIAGNOSIS — R9389 Abnormal findings on diagnostic imaging of other specified body structures: Secondary | ICD-10-CM | POA: Diagnosis not present

## 2013-07-15 HISTORY — PX: HYSTEROSCOPY WITH D & C: SHX1775

## 2013-07-15 HISTORY — DX: Anxiety disorder, unspecified: F41.9

## 2013-07-15 SURGERY — DILATATION AND CURETTAGE /HYSTEROSCOPY
Anesthesia: General

## 2013-07-15 MED ORDER — ONDANSETRON HCL 4 MG/2ML IJ SOLN
INTRAMUSCULAR | Status: AC
  Filled 2013-07-15: qty 2

## 2013-07-15 MED ORDER — TRAMADOL HCL 50 MG PO TABS: 50.0000 mg | ORAL_TABLET | Freq: Four times a day (QID) | ORAL | Status: DC | PRN

## 2013-07-15 MED ORDER — PROPOFOL 10 MG/ML IV EMUL
INTRAVENOUS | Status: AC
  Filled 2013-07-15: qty 20

## 2013-07-15 MED ORDER — CEFAZOLIN SODIUM-DEXTROSE 2-3 GM-% IV SOLR
INTRAVENOUS | Status: AC
  Administered 2013-07-15: 2 g via INTRAVENOUS
  Filled 2013-07-15: qty 50

## 2013-07-15 MED ORDER — SODIUM CHLORIDE 0.9 % IJ SOLN
INTRAMUSCULAR | Status: DC | PRN
  Administered 2013-07-15: 50 mL

## 2013-07-15 MED ORDER — FENTANYL CITRATE 0.05 MG/ML IJ SOLN
INTRAMUSCULAR | Status: DC | PRN
  Administered 2013-07-15: 50 ug via INTRAVENOUS

## 2013-07-15 MED ORDER — PROPOFOL 10 MG/ML IV BOLUS
INTRAVENOUS | Status: DC | PRN
  Administered 2013-07-15: 60 mg via INTRAVENOUS
  Administered 2013-07-15: 140 mg via INTRAVENOUS

## 2013-07-15 MED ORDER — SODIUM CHLORIDE 0.9 % IJ SOLN
INTRAMUSCULAR | Status: AC
  Filled 2013-07-15: qty 50

## 2013-07-15 MED ORDER — LACTATED RINGERS IV SOLN
INTRAVENOUS | Status: DC
  Administered 2013-07-15 (×2): via INTRAVENOUS

## 2013-07-15 MED ORDER — GLYCINE 1.5 % IR SOLN
Status: DC | PRN
  Administered 2013-07-15: 3000 mL

## 2013-07-15 MED ORDER — LIDOCAINE HCL (CARDIAC) 20 MG/ML IV SOLN
INTRAVENOUS | Status: DC | PRN
  Administered 2013-07-15: 50 mg via INTRAVENOUS

## 2013-07-15 MED ORDER — ACETAMINOPHEN 160 MG/5ML PO SOLN
ORAL | Status: AC
  Filled 2013-07-15: qty 40.6

## 2013-07-15 MED ORDER — ONDANSETRON HCL 4 MG/2ML IJ SOLN
INTRAMUSCULAR | Status: DC | PRN
  Administered 2013-07-15: 4 mg via INTRAVENOUS

## 2013-07-15 MED ORDER — VASOPRESSIN 20 UNIT/ML IJ SOLN
INTRAMUSCULAR | Status: DC | PRN
  Administered 2013-07-15: 20 [IU]

## 2013-07-15 MED ORDER — FENTANYL CITRATE 0.05 MG/ML IJ SOLN
INTRAMUSCULAR | Status: AC
  Filled 2013-07-15: qty 5

## 2013-07-15 MED ORDER — VASOPRESSIN 20 UNIT/ML IJ SOLN
INTRAMUSCULAR | Status: AC
  Filled 2013-07-15: qty 1

## 2013-07-15 MED ORDER — BUPIVACAINE HCL (PF) 0.25 % IJ SOLN
INTRAMUSCULAR | Status: AC
  Filled 2013-07-15: qty 30

## 2013-07-15 MED ORDER — BUPIVACAINE HCL (PF) 0.25 % IJ SOLN
INTRAMUSCULAR | Status: DC | PRN
  Administered 2013-07-15: 20 mL

## 2013-07-15 MED ORDER — CEFAZOLIN SODIUM-DEXTROSE 2-3 GM-% IV SOLR: 2.0000 g | INTRAVENOUS | Status: DC

## 2013-07-15 MED ORDER — LIDOCAINE HCL (CARDIAC) 20 MG/ML IV SOLN
INTRAVENOUS | Status: AC
  Filled 2013-07-15: qty 5

## 2013-07-15 MED ORDER — ACETAMINOPHEN 160 MG/5ML PO SOLN
1000.0000 mg | Freq: Once | ORAL | Status: AC
  Administered 2013-07-15: 1000 mg via ORAL

## 2013-07-15 MED ORDER — PHENYLEPHRINE HCL 10 MG/ML IJ SOLN
INTRAMUSCULAR | Status: DC | PRN
  Administered 2013-07-15 (×2): 80 ug via INTRAVENOUS

## 2013-07-15 SURGICAL SUPPLY — 19 items
CANISTER SUCT 3000ML (MISCELLANEOUS) ×3 IMPLANT
CATH ROBINSON RED A/P 16FR (CATHETERS) ×3 IMPLANT
CLOTH BEACON ORANGE TIMEOUT ST (SAFETY) ×3 IMPLANT
CONTAINER PREFILL 10% NBF 60ML (FORM) ×6 IMPLANT
DRAPE HYSTEROSCOPY (DRAPE) ×3 IMPLANT
DRSG TELFA 3X8 NADH (GAUZE/BANDAGES/DRESSINGS) ×3 IMPLANT
GLOVE BIO SURGEON STRL SZ7.5 (GLOVE) ×3 IMPLANT
GOWN STRL REUS W/TWL LRG LVL3 (GOWN DISPOSABLE) ×9 IMPLANT
NDL SPNL 22GX3.5 QUINCKE BK (NEEDLE) ×1 IMPLANT
NEEDLE SPNL 22GX3.5 QUINCKE BK (NEEDLE) ×3 IMPLANT
PACK VAGINAL MINOR WOMEN LF (CUSTOM PROCEDURE TRAY) ×3 IMPLANT
PAD DRESSING TELFA 3X8 NADH (GAUZE/BANDAGES/DRESSINGS) ×1 IMPLANT
PAD OB MATERNITY 4.3X12.25 (PERSONAL CARE ITEMS) ×3 IMPLANT
SET TUBING HYSTEROSCOPY 2 NDL (TUBING) ×2 IMPLANT
SYR CONTROL 10ML LL (SYRINGE) ×3 IMPLANT
SYR TB 1ML 25GX5/8 (SYRINGE) ×3 IMPLANT
TOWEL OR 17X24 6PK STRL BLUE (TOWEL DISPOSABLE) ×6 IMPLANT
TUBE HYSTEROSCOPY W Y-CONNECT (TUBING) ×2 IMPLANT
WATER STERILE IRR 1000ML POUR (IV SOLUTION) ×3 IMPLANT

## 2013-07-15 NOTE — Anesthesia Postprocedure Evaluation (Signed)
  Anesthesia Post-op Note  Patient: Tanya Walker  Procedure(s) Performed: Procedure(s): DILATATION AND CURETTAGE /HYSTEROSCOPY (N/A) Last Vitals:  Filed Vitals:   07/15/13 0954  BP: 150/85  Pulse:   Temp:   Resp:     Patient is awake and responsive. Pain and nausea are reasonably well controlled. Vital signs are stable and clinically acceptable. Oxygen saturation is clinically acceptable. There are no apparent anesthetic complications at this time. Patient is ready for discharge.

## 2013-07-15 NOTE — H&P (Signed)
NAME:  KHRYSTYNE, ARPIN NO.:  MEDICAL RECORD NO.:  [id number]  LOCATION:                                 FACILITY:  PHYSICIAN:  Lovenia Kim, M.D.DATE OF BIRTH:  07/23/1940  DATE OF ADMISSION: DATE OF DISCHARGE:                             HISTORY & PHYSICAL   CHIEF COMPLAINT:  Postmenopausal bleeding with a thickened fluid-filled endometrium.  HISTORY OF PRESENT ILLNESS:  The patient is a 73 year old white female, G2, P2, who has a history of postmenopausal bleeding, had an endometrial biopsy in December 2014 for an initial episode, which revealed a hypo cellular sample with rare fragmented strips of benign surface of mucosa. She then proceeded with further postmenopausal bleeding.  She had a thickened endometrium of 6 mm with a fluid-filled endometrium.  Decision was made to proceed with definitive evaluation in the form of hysteroscopy with D and C.  ALLERGIES: Fosamax, and Actonel.  MEDICATIONS:  Include Estrace cream p.r.n., calcium and vitamin D.  FAMILY HISTORY:  Heart disease, chronic hypertension, prostate cancer.  PAST MEDICAL HISTORY:  She has a history of 2 vaginal deliveries.  PAST SURGICAL HISTORY:  Remarkable for cholecystectomy, appendectomy, umbilical pain treatment.  PHYSICAL EXAMINATION:  GENERAL:  The patient is a well-developed, well- nourished, white female, in no acute distress. HEENT:  Normal. NECK:  Supple.  Full range of motion. LUNGS:  Clear. HEART:  Regular rate and rhythm. ABDOMEN:  Soft, nontender. PELVIC:  Normal size uterus and no adnexal masses. EXTREMITIES:  There are no cords. NEUROLOGIC:  Nonfocal. SKIN:  Intact.  IMPRESSION:  Postmenopausal bleeding with a history of a normal endometrial biopsy followed by recurrent endometrial bleeding.  PLAN:  To proceed with diagnostic hysteroscopy, D and C.  Risks of anesthesia, infection, bleeding, injury to surrounding organs, possible need for repair was  discussed.  Delayed versus immediate complications to include bowel and bladder injury are noted.  The patient acknowledges and wishes to proceed.     Lovenia Kim, M.D.     RJT/MEDQ  D:  07/14/2013  T:  07/14/2013  Job:  [id number]

## 2013-07-15 NOTE — Progress Notes (Signed)
Patient ID: Tanya Walker, female   DOB: 1941-01-27, 73 y.o.   MRN: [id number] Patient seen and examined. Consent witnessed and signed. No changes noted. Update completed. CBC    Component Value Date/Time   WBC 6.9 07/12/2013 0920   RBC 4.47 07/12/2013 0920   HGB 14.0 07/12/2013 0920   HCT 41.9 07/12/2013 0920   PLT 154 07/12/2013 0920   MCV 93.7 07/12/2013 0920   MCH 31.3 07/12/2013 0920   MCHC 33.4 07/12/2013 0920   RDW 13.2 07/12/2013 0920   LYMPHSABS 1.7 10/28/2009 1043   MONOABS 0.6 10/28/2009 1043   EOSABS 0.1 10/28/2009 1043   BASOSABS 0.0 10/28/2009 1043

## 2013-07-15 NOTE — Discharge Instructions (Signed)
Dilation and Curettage or Vacuum Curettage, Care After  Refer to this sheet in the next few weeks. These instructions provide you with information on caring for yourself after your procedure. Your health care provider may also give you more specific instructions. Your treatment has been planned according to current medical practices, but problems sometimes occur. Call your health care provider if you have any problems or questions after your procedure.  WHAT TO EXPECT AFTER THE PROCEDURE After your procedure, it is typical to have light cramping and bleeding. This may last for 2 days to 2 weeks after the procedure.  HOME CARE INSTRUCTIONS   Do not drive for 24 hours.  Wait 1 week before returning to strenuous activities.  Take your temperature 2 times a day for 4 days and write it down. Provide these temperatures to your health care provider if you develop a fever.  Avoid long periods of standing.  Avoid heavy lifting, pushing, or pulling. Do not lift anything heavier than 10 pounds (4.5 kg).  Limit stair climbing to once or twice a day.  Take rest periods often.  You may resume your usual diet.  Drink enough fluids to keep your urine clear or pale yellow.  Your usual bowel function should return. If you have constipation, you may:  Take a mild laxative with permission from your health care provider.  Add fruit and bran to your diet.  Drink more fluids.  Take showers instead of baths until your health care provider gives you permission to take baths.  Do not go swimming or use a hot tub until your health care provider approves.  Try to have someone with you or available to you the first 24 48 hours, especially if you were given a general anesthetic.  Do not douche, use tampons, or have intercourse for 2 weeks after the procedure.  Only take over-the-counter or prescription medicines as directed by your health care provider. Do not take aspirin. It can cause  bleeding.  Follow up with your health care provider as directed.  SEEK MEDICAL CARE IF:   You have increasing cramps or pain that is not relieved with medicine.  You have abdominal pain that does not seem to be related to the same area of earlier cramping and pain.  You have bad smelling vaginal discharge.  You have a rash.  You are having problems with any medicine. SEEK IMMEDIATE MEDICAL CARE IF:   You have bleeding that is heavier than a normal menstrual period.  You have a fever.  You have chest pain.  You have shortness of breath.  You feel dizzy or feel like fainting.  You pass out.  You have pain in your shoulder strap area.  You have heavy vaginal bleeding with or without blood clots.  Document Released: 01/22/2000 Document Revised: 11/14/2012 Document Reviewed: 08/23/2012

## 2013-07-15 NOTE — Anesthesia Preprocedure Evaluation (Signed)
Anesthesia Evaluation  Patient identified by MRN, date of birth, ID band Patient awake    Reviewed: Allergy & Precautions, H&P , Patient's Chart, lab work & pertinent test results, reviewed documented beta blocker date and time   Airway Mallampati: II  TM Distance: >3 FB Neck ROM: full    Dental no notable dental hx.    Pulmonary  breath sounds clear to auscultation  Pulmonary exam normal       Cardiovascular Rhythm:regular Rate:Normal     Neuro/Psych    GI/Hepatic   Endo/Other    Renal/GU      Musculoskeletal   Abdominal   Peds  Hematology   Anesthesia Other Findings   Reproductive/Obstetrics                             Anesthesia Physical Anesthesia Plan  ASA: II  Anesthesia Plan:    Post-op Pain Management:    Induction: Intravenous  Airway Management Planned: LMA  Additional Equipment:   Intra-op Plan:   Post-operative Plan:   Informed Consent: I have reviewed the patients History and Physical, chart, labs and discussed the procedure including the risks, benefits and alternatives for the proposed anesthesia with the patient or authorized representative who has indicated his/her understanding and acceptance.   Dental Advisory Given and Dental advisory given  Plan Discussed with: CRNA and Surgeon  Anesthesia Plan Comments: (Discussed GA with LMA, possible sore throat, potential need to switch to ETT, N/V, pulmonary aspiration. Questions answered. )        Anesthesia Quick Evaluation  

## 2013-07-15 NOTE — Op Note (Signed)
07/15/2013  11:15 AM  PATIENT:  Tanya Walker  73 y.o. female  PRE-OPERATIVE DIAGNOSIS:  Postmenopausal Bleeding Thickened fluid filled endometrium  POST-OPERATIVE DIAGNOSIS:  Postmenopausal Bleeding Same and likely atrophic endometrium  PROCEDURE:  Procedure(s): DILATATION AND CURETTAGE /HYSTEROSCOPY  SURGEON:  Surgeon(s): Lovenia Kim, MD  ASSISTANTS: none   ANESTHESIA:   local and general  ESTIMATED BLOOD LOSS: minimal and Fluid deficit 54ml  DRAINS: none   LOCAL MEDICATIONS USED:  MARCAINE    and Amount: 20 ml  SPECIMEN:  Source of Specimen:  EMC  DISPOSITION OF SPECIMEN:  PATHOLOGY  COUNTS:  YES  DICTATION #: [id number]  PLAN OF CARE: dc home today  PATIENT DISPOSITION:  PACU - hemodynamically stable.

## 2013-07-15 NOTE — Transfer of Care (Signed)
Immediate Anesthesia Transfer of Care Note  Patient: Tanya Walker  Procedure(s) Performed: Procedure(s): DILATATION AND CURETTAGE /HYSTEROSCOPY (N/A)  Patient Location: PACU  Anesthesia Type:General  Level of Consciousness: awake, alert  and oriented  Airway & Oxygen Therapy: Patient Spontanous Breathing nasal canula  Post-op Assessment: Report given to PACU RN and Post -op Vital signs reviewed and stable  Post vital signs: Reviewed and stable  Complications: No apparent anesthesia complications

## 2013-07-16 ENCOUNTER — Encounter (HOSPITAL_COMMUNITY): Payer: Self-pay | Admitting: Obstetrics and Gynecology

## 2013-07-16 NOTE — Op Note (Signed)
NAME:  Tanya Walker, Tanya Walker NO.:  [id number]  MEDICAL RECORD NO.:  [id number]  LOCATION:  [facility]                          FACILITY:  [facility]  PHYSICIAN:  Lovenia Kim, M.D.DATE OF BIRTH:  07/06/1940  DATE OF PROCEDURE: DATE OF DISCHARGE:  07/15/2013                              OPERATIVE REPORT   PREOPERATIVE DIAGNOSIS:  Postmenopausal bleeding with thickened endometrium and fluid-filled endometrium by ultrasound.  POSTOPERATIVE DIAGNOSIS:  Likely atrophic endometrium.  PROCEDURE:  Diagnostic hysteroscopy with D and C.  SURGEON:  Lovenia Kim, M.D.  ASSISTANT:  None.  ANESTHESIA:  General and local.  ESTIMATED BLOOD LOSS:  Less than 50 mL.  FLUID DEFICIT:  35 mL.  COMPLICATIONS:  None.  DRAINS:  None.  COUNTS:  Correct.  DISPOSITION:  The patient to recovery in good condition.  BRIEF OPERATIVE NOTE:  After being apprised of risks of anesthesia, infection, bleeding, and injury to surrounding organs; possible need for repair;  delayed versus immediate complications to include bowel and bladder injury, possible need for repair.  The patient was brought to the operating room where she was administered general anesthetic without complications, prepped and draped in usual sterile fashion, catheterized until the bladder was empty.  Exam under anesthesia reveals a small anteflexed uterus with no adnexal masses appreciated.  At this time, a dilute Marcaine solution placed, standard paracervical block 20 mL total.  Dilute  Pitressin solution placed at 3 and 9 o'clock, 18 mL total.  Cervix was then easily dilated up to a #23 Pratt dilator. Hysteroscope placed.  Visualization reveals a thin atrophic endometrium with no evidence of masses or thickening.  At this time, bilateral normal tubal ostia are noted and no structural lesions were seen.  D and C was performed using sharp curettage in a 4-quadrant method.  Revisualization afterwards reveals an otherwise  normal endometrium and no evidence of uterine perforation.  The patient tolerated the procedure well, was awakened and transferred to recovery room in good condition.     Lovenia Kim, M.D.     RJT/MEDQ  D:  07/15/2013  T:  07/16/2013  Job:  [id number]

## 2013-08-26 ENCOUNTER — Encounter: Payer: Self-pay | Admitting: Internal Medicine

## 2013-10-11 DIAGNOSIS — L570 Actinic keratosis: Secondary | ICD-10-CM | POA: Diagnosis not present

## 2013-10-11 DIAGNOSIS — D485 Neoplasm of uncertain behavior of skin: Secondary | ICD-10-CM | POA: Diagnosis not present

## 2013-10-11 DIAGNOSIS — Z85828 Personal history of other malignant neoplasm of skin: Secondary | ICD-10-CM | POA: Diagnosis not present

## 2013-10-11 DIAGNOSIS — L608 Other nail disorders: Secondary | ICD-10-CM | POA: Diagnosis not present

## 2013-10-15 DIAGNOSIS — D2370 Other benign neoplasm of skin of unspecified lower limb, including hip: Secondary | ICD-10-CM | POA: Diagnosis not present

## 2013-11-29 DIAGNOSIS — Z23 Encounter for immunization: Secondary | ICD-10-CM | POA: Diagnosis not present

## 2013-12-30 ENCOUNTER — Telehealth: Payer: Self-pay | Admitting: Internal Medicine

## 2013-12-30 NOTE — Telephone Encounter (Signed)
Patient would like to have pneumonia vac. Please advise.

## 2013-12-31 NOTE — Telephone Encounter (Signed)
Spoke with patient.  She will call back to schedule.

## 2013-12-31 NOTE — Telephone Encounter (Signed)
Last pneumonia done back in 2007. Ok for pneumonia vac...Tanya Walker

## 2014-01-13 DIAGNOSIS — Z124 Encounter for screening for malignant neoplasm of cervix: Secondary | ICD-10-CM | POA: Diagnosis not present

## 2014-01-13 DIAGNOSIS — Z01419 Encounter for gynecological examination (general) (routine) without abnormal findings: Secondary | ICD-10-CM | POA: Diagnosis not present

## 2014-01-15 ENCOUNTER — Ambulatory Visit: Payer: Medicare Other

## 2014-02-11 DIAGNOSIS — Z86018 Personal history of other benign neoplasm: Secondary | ICD-10-CM | POA: Diagnosis not present

## 2014-02-11 DIAGNOSIS — Z85828 Personal history of other malignant neoplasm of skin: Secondary | ICD-10-CM | POA: Diagnosis not present

## 2014-02-11 DIAGNOSIS — D239 Other benign neoplasm of skin, unspecified: Secondary | ICD-10-CM | POA: Diagnosis not present

## 2014-02-11 DIAGNOSIS — L821 Other seborrheic keratosis: Secondary | ICD-10-CM | POA: Diagnosis not present

## 2014-02-11 DIAGNOSIS — D1800 Hemangioma unspecified site: Secondary | ICD-10-CM | POA: Diagnosis not present

## 2014-02-24 DIAGNOSIS — R310 Gross hematuria: Secondary | ICD-10-CM | POA: Diagnosis not present

## 2014-02-24 DIAGNOSIS — R351 Nocturia: Secondary | ICD-10-CM | POA: Diagnosis not present

## 2014-03-06 DIAGNOSIS — H2513 Age-related nuclear cataract, bilateral: Secondary | ICD-10-CM | POA: Diagnosis not present

## 2014-03-06 DIAGNOSIS — H3550 Unspecified hereditary retinal dystrophy: Secondary | ICD-10-CM | POA: Diagnosis not present

## 2014-03-06 DIAGNOSIS — H25012 Cortical age-related cataract, left eye: Secondary | ICD-10-CM | POA: Diagnosis not present

## 2014-05-29 ENCOUNTER — Ambulatory Visit (INDEPENDENT_AMBULATORY_CARE_PROVIDER_SITE_OTHER): Payer: Medicare Other | Admitting: Internal Medicine

## 2014-05-29 ENCOUNTER — Encounter: Payer: Self-pay | Admitting: Internal Medicine

## 2014-05-29 VITALS — BP 130/80 | HR 67 | Wt 109.0 lb

## 2014-05-29 DIAGNOSIS — J309 Allergic rhinitis, unspecified: Secondary | ICD-10-CM | POA: Insufficient documentation

## 2014-05-29 DIAGNOSIS — J069 Acute upper respiratory infection, unspecified: Secondary | ICD-10-CM

## 2014-05-29 MED ORDER — LORATADINE 10 MG PO TABS: 10.0000 mg | ORAL_TABLET | Freq: Every day | ORAL | Status: DC

## 2014-05-29 MED ORDER — FLUTICASONE PROPIONATE 50 MCG/ACT NA SUSP: 2.0000 | Freq: Every day | NASAL | Status: DC

## 2014-05-29 MED ORDER — AZITHROMYCIN 250 MG PO TABS: ORAL_TABLET | ORAL | Status: DC

## 2014-05-29 NOTE — Assessment & Plan Note (Signed)
Zpac 

## 2014-05-29 NOTE — Progress Notes (Signed)
   Subjective:    HPI  C/o allergies x weeks, nasal d/c, sneezing  Review of Systems  Constitutional: Negative for chills.  HENT: Positive for congestion and rhinorrhea.   Eyes: Negative for itching.  Respiratory: Positive for cough.   Genitourinary: Negative for dysuria and flank pain.  Neurological: Negative for headaches.  Psychiatric/Behavioral: Negative for dysphoric mood.       Objective:   Physical Exam  Constitutional: She appears well-developed. No distress.  HENT:  Head: Normocephalic.  Right Ear: External ear normal.  Left Ear: External ear normal.  Nose: Nose normal.  Mouth/Throat: No oropharyngeal exudate.  eryth nasal lining  Eyes: Conjunctivae are normal. Pupils are equal, round, and reactive to light. Right eye exhibits no discharge. Left eye exhibits no discharge.  Neck: Normal range of motion. Neck supple. No JVD present. No tracheal deviation present. No thyromegaly present.  Cardiovascular: Normal rate, regular rhythm and normal heart sounds.   Pulmonary/Chest: No stridor. No respiratory distress. She has no wheezes.  Abdominal: Soft. Bowel sounds are normal. She exhibits no distension and no mass. There is no tenderness. There is no rebound and no guarding.  Musculoskeletal: She exhibits no edema or tenderness.  Lymphadenopathy:    She has no cervical adenopathy.  Neurological: She displays normal reflexes. No cranial nerve deficit. She exhibits normal muscle tone. Coordination normal.  Skin: No rash noted. No erythema.  Psychiatric: She has a normal mood and affect. Her behavior is normal. Judgment and thought content normal.  Swollen nasal mucosa, blood streaks B        Assessment & Plan:

## 2014-05-29 NOTE — Assessment & Plan Note (Signed)
Flonase Loratidin Afrin prn

## 2014-05-29 NOTE — Progress Notes (Signed)
Pre visit review using our clinic review tool, if applicable. No additional management support is needed unless otherwise documented below in the visit note. 

## 2014-07-03 ENCOUNTER — Ambulatory Visit: Payer: Medicare Other | Admitting: Internal Medicine

## 2014-07-15 ENCOUNTER — Encounter: Payer: Self-pay | Admitting: Internal Medicine

## 2014-07-15 DIAGNOSIS — Z1231 Encounter for screening mammogram for malignant neoplasm of breast: Secondary | ICD-10-CM | POA: Diagnosis not present

## 2014-07-15 LAB — HM MAMMOGRAPHY

## 2014-07-17 ENCOUNTER — Ambulatory Visit (INDEPENDENT_AMBULATORY_CARE_PROVIDER_SITE_OTHER): Payer: Medicare Other | Admitting: Internal Medicine

## 2014-07-17 ENCOUNTER — Encounter: Payer: Self-pay | Admitting: Internal Medicine

## 2014-07-17 VITALS — BP 136/88 | HR 74 | Wt 109.0 lb

## 2014-07-17 DIAGNOSIS — Z23 Encounter for immunization: Secondary | ICD-10-CM | POA: Diagnosis not present

## 2014-07-17 DIAGNOSIS — J301 Allergic rhinitis due to pollen: Secondary | ICD-10-CM

## 2014-07-17 DIAGNOSIS — E559 Vitamin D deficiency, unspecified: Secondary | ICD-10-CM | POA: Diagnosis not present

## 2014-07-17 NOTE — Assessment & Plan Note (Signed)
On Vit D 

## 2014-07-17 NOTE — Progress Notes (Signed)
   Subjective:    HPI  F/u allergies x weeks, nasal d/c, sneezing - better. No cough  Review of Systems  Constitutional: Negative for chills.  HENT: Positive for congestion. Negative for rhinorrhea.   Eyes: Negative for itching.  Respiratory: Negative for cough.   Genitourinary: Negative for dysuria and flank pain.  Neurological: Negative for headaches.  Psychiatric/Behavioral: Negative for dysphoric mood.   Wt Readings from Last 3 Encounters:  07/17/14 109 lb (49.442 kg)  05/29/14 109 lb (49.442 kg)  07/15/13 110 lb (49.896 kg)   BP Readings from Last 3 Encounters:  07/17/14 136/88  05/29/14 130/80  07/15/13 127/61        Objective:   Physical Exam  Constitutional: She appears well-developed. No distress.  HENT:  Head: Normocephalic.  Right Ear: External ear normal.  Left Ear: External ear normal.  Nose: Nose normal.  Mouth/Throat: No oropharyngeal exudate.  No eryth nasal lining  Eyes: Conjunctivae are normal. Pupils are equal, round, and reactive to light. Right eye exhibits no discharge. Left eye exhibits no discharge.  Neck: Normal range of motion. Neck supple. No JVD present. No tracheal deviation present. No thyromegaly present.  Cardiovascular: Normal rate, regular rhythm and normal heart sounds.   Pulmonary/Chest: No stridor. No respiratory distress. She has no wheezes.  Abdominal: Soft. Bowel sounds are normal. She exhibits no distension and no mass. There is no tenderness. There is no rebound and no guarding.  Musculoskeletal: She exhibits no edema or tenderness.  Lymphadenopathy:    She has no cervical adenopathy.  Neurological: She displays normal reflexes. No cranial nerve deficit. She exhibits normal muscle tone. Coordination normal.  Skin: No rash noted. No erythema.  Psychiatric: She has a normal mood and affect. Her behavior is normal. Judgment and thought content normal.     Lab Results  Component Value Date   WBC 6.9 07/12/2013   HGB 14.0  07/12/2013   HCT 41.9 07/12/2013   PLT 154 07/12/2013   GLUCOSE 98 07/12/2013   CHOL 200 10/28/2009   TRIG 58.0 10/28/2009   HDL 55.40 10/28/2009   LDLDIRECT 135.4 12/26/2008   LDLCALC 133* 10/28/2009   ALT 15 10/28/2009   AST 17 10/28/2009   NA 140 07/12/2013   K 4.1 07/12/2013   CL 101 07/12/2013   CREATININE 0.75 07/12/2013   BUN 14 07/12/2013   CO2 30 07/12/2013   TSH 1.69 10/28/2009        Assessment & Plan:

## 2014-07-17 NOTE — Assessment & Plan Note (Signed)
Chronic On Claritin

## 2014-07-17 NOTE — Progress Notes (Signed)
Pre visit review using our clinic review tool, if applicable. No additional management support is needed unless otherwise documented below in the visit note. 

## 2014-08-13 DIAGNOSIS — D485 Neoplasm of uncertain behavior of skin: Secondary | ICD-10-CM | POA: Diagnosis not present

## 2014-08-13 DIAGNOSIS — D2371 Other benign neoplasm of skin of right lower limb, including hip: Secondary | ICD-10-CM | POA: Diagnosis not present

## 2014-08-26 DIAGNOSIS — Z4889 Encounter for other specified surgical aftercare: Secondary | ICD-10-CM | POA: Diagnosis not present

## 2014-12-02 DIAGNOSIS — Z23 Encounter for immunization: Secondary | ICD-10-CM | POA: Diagnosis not present

## 2015-01-05 DIAGNOSIS — Z7689 Persons encountering health services in other specified circumstances: Secondary | ICD-10-CM

## 2015-02-03 ENCOUNTER — Encounter: Payer: Self-pay | Admitting: Internal Medicine

## 2015-02-03 ENCOUNTER — Ambulatory Visit (INDEPENDENT_AMBULATORY_CARE_PROVIDER_SITE_OTHER): Payer: Medicare Other | Admitting: Internal Medicine

## 2015-02-03 VITALS — BP 138/82 | HR 75 | Temp 97.9°F | Resp 20 | Ht 64.0 in | Wt 114.0 lb

## 2015-02-03 DIAGNOSIS — F4323 Adjustment disorder with mixed anxiety and depressed mood: Secondary | ICD-10-CM | POA: Diagnosis not present

## 2015-02-03 DIAGNOSIS — E559 Vitamin D deficiency, unspecified: Secondary | ICD-10-CM | POA: Diagnosis not present

## 2015-02-03 MED ORDER — ESCITALOPRAM OXALATE 5 MG PO TABS: 5.0000 mg | ORAL_TABLET | Freq: Every day | ORAL | Status: DC

## 2015-02-03 NOTE — Progress Notes (Signed)
Pre visit review using our clinic review tool, if applicable. No additional management support is needed unless otherwise documented below in the visit note. 

## 2015-02-03 NOTE — Assessment & Plan Note (Signed)
Family stress 12/16 Lexapro

## 2015-02-03 NOTE — Assessment & Plan Note (Signed)
On Vit D 

## 2015-02-03 NOTE — Progress Notes (Signed)
Subjective:  Patient ID: Tanya Walker, female    DOB: March 12, 1940  Age: 74 y.o. MRN: [id number]  CC: No chief complaint on file.   HPI Tanya Walker presents for  Elevated BP at dentist's - normalized.  Pt is stressed w/her grandson  Outpatient Prescriptions Prior to Visit  Medication Sig Dispense Refill  . loratadine (CLARITIN) 10 MG tablet Take 1 tablet (10 mg total) by mouth daily. 100 tablet 3  . Multiple Vitamins-Minerals (PRESERVISION/LUTEIN PO) Take 1 tablet by mouth daily.    Marland Kitchen OVER THE COUNTER MEDICATION Solgar Calcium 600, Vit D 80IU, Mag 300 mg  Take 3 by mouth daily.    Marland Kitchen OVER THE COUNTER MEDICATION Nature's Bounty D3 2000 IU daily.    Marland Kitchen estradiol (ESTRACE) 0.1 MG/GM vaginal cream Place 0.5 Applicatorfuls vaginally at bedtime.    . fluticasone (FLONASE) 50 MCG/ACT nasal spray Place 2 sprays into both nostrils daily. (Patient not taking: Reported on 07/17/2014) 16 g 3   No facility-administered medications prior to visit.    ROS Review of Systems  Constitutional: Negative for chills, activity change, appetite change, fatigue and unexpected weight change.  HENT: Negative for congestion, mouth sores and sinus pressure.   Eyes: Negative for visual disturbance.  Respiratory: Negative for cough and chest tightness.   Gastrointestinal: Negative for nausea and abdominal pain.  Genitourinary: Negative for frequency, difficulty urinating and vaginal pain.  Musculoskeletal: Negative for back pain and gait problem.  Skin: Negative for pallor and rash.  Neurological: Negative for dizziness, tremors, weakness, numbness and headaches.  Psychiatric/Behavioral: Negative for suicidal ideas, confusion and sleep disturbance.    Objective:  BP 138/82 mmHg  Pulse 75  Temp(Src) 97.9 F (36.6 C) (Oral)  Resp 20  Ht 5\' 4"  (1.626 m)  Wt 114 lb (51.71 kg)  BMI 19.56 kg/m2  SpO2 99%  BP Readings from Last 3 Encounters:  02/03/15 138/82  07/17/14 136/88  05/29/14 130/80    Wt  Readings from Last 3 Encounters:  02/03/15 114 lb (51.71 kg)  07/17/14 109 lb (49.442 kg)  05/29/14 109 lb (49.442 kg)    Physical Exam  Constitutional: She appears well-developed. No distress.  HENT:  Head: Normocephalic.  Right Ear: External ear normal.  Left Ear: External ear normal.  Nose: Nose normal.  Mouth/Throat: Oropharynx is clear and moist.  Eyes: Conjunctivae are normal. Pupils are equal, round, and reactive to light. Right eye exhibits no discharge. Left eye exhibits no discharge.  Neck: Normal range of motion. Neck supple. No JVD present. No tracheal deviation present. No thyromegaly present.  Cardiovascular: Normal rate, regular rhythm and normal heart sounds.   Pulmonary/Chest: No stridor. No respiratory distress. She has no wheezes.  Abdominal: Soft. Bowel sounds are normal. She exhibits no distension and no mass. There is no tenderness. There is no rebound and no guarding.  Musculoskeletal: She exhibits no edema or tenderness.  Lymphadenopathy:    She has no cervical adenopathy.  Neurological: She displays normal reflexes. No cranial nerve deficit. She exhibits normal muscle tone. Coordination normal.  Skin: No rash noted. No erythema.  Psychiatric: Judgment and thought content normal.    Lab Results  Component Value Date   WBC 6.9 07/12/2013   HGB 14.0 07/12/2013   HCT 41.9 07/12/2013   PLT 154 07/12/2013   GLUCOSE 98 07/12/2013   CHOL 200 10/28/2009   TRIG 58.0 10/28/2009   HDL 55.40 10/28/2009   LDLDIRECT 135.4 12/26/2008   LDLCALC 133* 10/28/2009  ALT 15 10/28/2009   AST 17 10/28/2009   NA 140 07/12/2013   K 4.1 07/12/2013   CL 101 07/12/2013   CREATININE 0.75 07/12/2013   BUN 14 07/12/2013   CO2 30 07/12/2013   TSH 1.69 10/28/2009    No results found.  Assessment & Plan:   Diagnoses and all orders for this visit:  Adjustment disorder with mixed anxiety and depressed mood  Vitamin D deficiency  Other orders -     escitalopram  (LEXAPRO) 5 MG tablet; Take 1 tablet (5 mg total) by mouth daily.  I have discontinued Ms. Fry estradiol and fluticasone. I am also having her start on escitalopram. Additionally, I am having her maintain her Multiple Vitamins-Minerals (PRESERVISION/LUTEIN PO), OVER THE COUNTER MEDICATION, OVER THE COUNTER MEDICATION, and loratadine.  Meds ordered this encounter  Medications  . escitalopram (LEXAPRO) 5 MG tablet    Sig: Take 1 tablet (5 mg total) by mouth daily.    Dispense:  30 tablet    Refill:  5     Follow-up: Return in about 2 months (around 04/06/2015) for a follow-up visit.  Walker Kehr, MD

## 2015-02-13 DIAGNOSIS — Z85828 Personal history of other malignant neoplasm of skin: Secondary | ICD-10-CM | POA: Diagnosis not present

## 2015-02-13 DIAGNOSIS — Z86018 Personal history of other benign neoplasm: Secondary | ICD-10-CM | POA: Diagnosis not present

## 2015-02-13 DIAGNOSIS — Z23 Encounter for immunization: Secondary | ICD-10-CM | POA: Diagnosis not present

## 2015-02-13 DIAGNOSIS — D225 Melanocytic nevi of trunk: Secondary | ICD-10-CM | POA: Diagnosis not present

## 2015-02-13 DIAGNOSIS — D2271 Melanocytic nevi of right lower limb, including hip: Secondary | ICD-10-CM | POA: Diagnosis not present

## 2015-02-13 DIAGNOSIS — L821 Other seborrheic keratosis: Secondary | ICD-10-CM | POA: Diagnosis not present

## 2015-02-18 ENCOUNTER — Telehealth: Payer: Self-pay | Admitting: Internal Medicine

## 2015-02-18 NOTE — Telephone Encounter (Signed)
Patient picked up papers today. Copy sent to be scanned

## 2015-02-18 NOTE — Telephone Encounter (Signed)
Pt has returned your call regarding some paperwork that was finished for the [facility]. She wanted to let you know that she will pick those papers up later this week or next.

## 2015-03-12 DIAGNOSIS — H3550 Unspecified hereditary retinal dystrophy: Secondary | ICD-10-CM | POA: Diagnosis not present

## 2015-03-12 DIAGNOSIS — H2513 Age-related nuclear cataract, bilateral: Secondary | ICD-10-CM | POA: Diagnosis not present

## 2015-03-12 DIAGNOSIS — H25012 Cortical age-related cataract, left eye: Secondary | ICD-10-CM | POA: Diagnosis not present

## 2015-04-06 ENCOUNTER — Ambulatory Visit: Payer: Medicare Other | Admitting: Internal Medicine

## 2015-05-07 DIAGNOSIS — Z23 Encounter for immunization: Secondary | ICD-10-CM | POA: Diagnosis not present

## 2015-05-07 DIAGNOSIS — D485 Neoplasm of uncertain behavior of skin: Secondary | ICD-10-CM | POA: Diagnosis not present

## 2015-05-07 DIAGNOSIS — D0462 Carcinoma in situ of skin of left upper limb, including shoulder: Secondary | ICD-10-CM | POA: Diagnosis not present

## 2015-06-18 DIAGNOSIS — D0462 Carcinoma in situ of skin of left upper limb, including shoulder: Secondary | ICD-10-CM | POA: Diagnosis not present

## 2015-07-17 DIAGNOSIS — Z1231 Encounter for screening mammogram for malignant neoplasm of breast: Secondary | ICD-10-CM | POA: Diagnosis not present

## 2015-09-25 ENCOUNTER — Encounter: Payer: Self-pay | Admitting: Internal Medicine

## 2015-09-25 ENCOUNTER — Ambulatory Visit (INDEPENDENT_AMBULATORY_CARE_PROVIDER_SITE_OTHER): Payer: Medicare Other | Admitting: Internal Medicine

## 2015-09-25 VITALS — BP 138/70 | HR 72 | Temp 98.0°F | Ht 64.0 in | Wt 107.5 lb

## 2015-09-25 DIAGNOSIS — Z23 Encounter for immunization: Secondary | ICD-10-CM | POA: Diagnosis not present

## 2015-09-25 DIAGNOSIS — E559 Vitamin D deficiency, unspecified: Secondary | ICD-10-CM

## 2015-09-25 DIAGNOSIS — E785 Hyperlipidemia, unspecified: Secondary | ICD-10-CM | POA: Diagnosis not present

## 2015-09-25 DIAGNOSIS — M810 Age-related osteoporosis without current pathological fracture: Secondary | ICD-10-CM

## 2015-09-25 DIAGNOSIS — Z0000 Encounter for general adult medical examination without abnormal findings: Secondary | ICD-10-CM | POA: Insufficient documentation

## 2015-09-25 DIAGNOSIS — R634 Abnormal weight loss: Secondary | ICD-10-CM

## 2015-09-25 NOTE — Progress Notes (Signed)
Pre visit review using our clinic review tool, if applicable. No additional management support is needed unless otherwise documented below in the visit note. 

## 2015-09-25 NOTE — Assessment & Plan Note (Signed)
[facility] [employer] 

## 2015-09-25 NOTE — Patient Instructions (Signed)
Preventive Care for Adults, Female A healthy lifestyle and preventive care can promote health and wellness. Preventive health guidelines for women include the following key practices.  A routine yearly physical is a good way to check with your health care provider about your health and preventive screening. It is a chance to share any concerns and updates on your health and to receive a thorough exam.  Visit your dentist for a routine exam and preventive care every 6 months. Brush your teeth twice a day and floss once a day. Good oral hygiene prevents tooth decay and gum disease.  The frequency of eye exams is based on your age, health, family medical history, use of contact lenses, and other factors. Follow your health care provider's recommendations for frequency of eye exams.  Eat a healthy diet. Foods like vegetables, fruits, whole grains, low-fat dairy products, and lean protein foods contain the nutrients you need without too many calories. Decrease your intake of foods high in solid fats, added sugars, and salt. Eat the right amount of calories for you.Get information about a proper diet from your health care provider, if necessary.  Regular physical exercise is one of the most important things you can do for your health. Most adults should get at least 150 minutes of moderate-intensity exercise (any activity that increases your heart rate and causes you to sweat) each week. In addition, most adults need muscle-strengthening exercises on 2 or more days a week.  Maintain a healthy weight. The body mass index (BMI) is a screening tool to identify possible weight problems. It provides an estimate of body fat based on height and weight. Your health care provider can find your BMI and can help you achieve or maintain a healthy weight.For adults 20 years and older:  A BMI below 18.5 is considered underweight.  A BMI of 18.5 to 24.9 is normal.  A BMI of 25 to 29.9 is considered overweight.  A  BMI of 30 and above is considered obese.  Maintain normal blood lipids and cholesterol levels by exercising and minimizing your intake of saturated fat. Eat a balanced diet with plenty of fruit and vegetables. Blood tests for lipids and cholesterol should begin at age 45 and be repeated every 5 years. If your lipid or cholesterol levels are high, you are over 50, or you are at high risk for heart disease, you may need your cholesterol levels checked more frequently.Ongoing high lipid and cholesterol levels should be treated with medicines if diet and exercise are not working.  If you smoke, find out from your health care provider how to quit. If you do not use tobacco, do not start.  Lung cancer screening is recommended for adults aged 45-80 years who are at high risk for developing lung cancer because of a history of smoking. A yearly low-dose CT scan of the lungs is recommended for people who have at least a 30-pack-year history of smoking and are a current smoker or have quit within the past 15 years. A pack year of smoking is smoking an average of 1 pack of cigarettes a day for 1 year (for example: 1 pack a day for 30 years or 2 packs a day for 15 years). Yearly screening should continue until the smoker has stopped smoking for at least 15 years. Yearly screening should be stopped for people who develop a health problem that would prevent them from having lung cancer treatment.  If you are pregnant, do not drink alcohol. If you are  breastfeeding, be very cautious about drinking alcohol. If you are not pregnant and choose to drink alcohol, do not have more than 1 drink per day. One drink is considered to be 12 ounces (355 mL) of beer, 5 ounces (148 mL) of wine, or 1.5 ounces (44 mL) of liquor.  Avoid use of street drugs. Do not share needles with anyone. Ask for help if you need support or instructions about stopping the use of drugs.  High blood pressure causes heart disease and increases the risk  of stroke. Your blood pressure should be checked at least every 1 to 2 years. Ongoing high blood pressure should be treated with medicines if weight loss and exercise do not work.  If you are 55-79 years old, ask your health care provider if you should take aspirin to prevent strokes.  Diabetes screening is done by taking a blood sample to check your blood glucose level after you have not eaten for a certain period of time (fasting). If you are not overweight and you do not have risk factors for diabetes, you should be screened once every 3 years starting at age 45. If you are overweight or obese and you are 40-70 years of age, you should be screened for diabetes every year as part of your cardiovascular risk assessment.  Breast cancer screening is essential preventive care for women. You should practice "breast self-awareness." This means understanding the normal appearance and feel of your breasts and may include breast self-examination. Any changes detected, no matter how small, should be reported to a health care provider. Women in their 20s and 30s should have a clinical breast exam (CBE) by a health care provider as part of a regular health exam every 1 to 3 years. After age 40, women should have a CBE every year. Starting at age 40, women should consider having a mammogram (breast X-ray test) every year. Women who have a family history of breast cancer should talk to their health care provider about genetic screening. Women at a high risk of breast cancer should talk to their health care providers about having an MRI and a mammogram every year.  Breast cancer gene (BRCA)-related cancer risk assessment is recommended for women who have family members with BRCA-related cancers. BRCA-related cancers include breast, ovarian, tubal, and peritoneal cancers. Having family members with these cancers may be associated with an increased risk for harmful changes (mutations) in the breast cancer genes BRCA1 and  BRCA2. Results of the assessment will determine the need for genetic counseling and BRCA1 and BRCA2 testing.  Your health care provider may recommend that you be screened regularly for cancer of the pelvic organs (ovaries, uterus, and vagina). This screening involves a pelvic examination, including checking for microscopic changes to the surface of your cervix (Pap test). You may be encouraged to have this screening done every 3 years, beginning at age 21.  For women ages 30-65, health care providers may recommend pelvic exams and Pap testing every 3 years, or they may recommend the Pap and pelvic exam, combined with testing for human papilloma virus (HPV), every 5 years. Some types of HPV increase your risk of cervical cancer. Testing for HPV may also be done on women of any age with unclear Pap test results.  Other health care providers may not recommend any screening for nonpregnant women who are considered low risk for pelvic cancer and who do not have symptoms. Ask your health care provider if a screening pelvic exam is right for   you.  If you have had past treatment for cervical cancer or a condition that could lead to cancer, you need Pap tests and screening for cancer for at least 20 years after your treatment. If Pap tests have been discontinued, your risk factors (such as having a new sexual partner) need to be reassessed to determine if screening should resume. Some women have medical problems that increase the chance of getting cervical cancer. In these cases, your health care provider may recommend more frequent screening and Pap tests.  Colorectal cancer can be detected and often prevented. Most routine colorectal cancer screening begins at the age of 50 years and continues through age 75 years. However, your health care provider may recommend screening at an earlier age if you have risk factors for colon cancer. On a yearly basis, your health care provider may provide home test kits to check  for hidden blood in the stool. Use of a small camera at the end of a tube, to directly examine the colon (sigmoidoscopy or colonoscopy), can detect the earliest forms of colorectal cancer. Talk to your health care provider about this at age 50, when routine screening begins. Direct exam of the colon should be repeated every 5-10 years through age 75 years, unless early forms of precancerous polyps or small growths are found.  People who are at an increased risk for hepatitis B should be screened for this virus. You are considered at high risk for hepatitis B if:  You were born in a country where hepatitis B occurs often. Talk with your health care provider about which countries are considered high risk.  Your parents were born in a high-risk country and you have not received a shot to protect against hepatitis B (hepatitis B vaccine).  You have HIV or AIDS.  You use needles to inject street drugs.  You live with, or have sex with, someone who has hepatitis B.  You get hemodialysis treatment.  You take certain medicines for conditions like cancer, organ transplantation, and autoimmune conditions.  Hepatitis C blood testing is recommended for all people born from 1945 through 1965 and any individual with known risks for hepatitis C.  Practice safe sex. Use condoms and avoid high-risk sexual practices to reduce the spread of sexually transmitted infections (STIs). STIs include gonorrhea, chlamydia, syphilis, trichomonas, herpes, HPV, and human immunodeficiency virus (HIV). Herpes, HIV, and HPV are viral illnesses that have no cure. They can result in disability, cancer, and death.  You should be screened for sexually transmitted illnesses (STIs) including gonorrhea and chlamydia if:  You are sexually active and are younger than 24 years.  You are older than 24 years and your health care provider tells you that you are at risk for this type of infection.  Your sexual activity has changed  since you were last screened and you are at an increased risk for chlamydia or gonorrhea. Ask your health care provider if you are at risk.  If you are at risk of being infected with HIV, it is recommended that you take a prescription medicine daily to prevent HIV infection. This is called preexposure prophylaxis (PrEP). You are considered at risk if:  You are sexually active and do not regularly use condoms or know the HIV status of your partner(s).  You take drugs by injection.  You are sexually active with a partner who has HIV.  Talk with your health care provider about whether you are at high risk of being infected with HIV. If   you choose to begin PrEP, you should first be tested for HIV. You should then be tested every 3 months for as long as you are taking PrEP.  Osteoporosis is a disease in which the bones lose minerals and strength with aging. This can result in serious bone fractures or breaks. The risk of osteoporosis can be identified using a bone density scan. Women ages 67 years and over and women at risk for fractures or osteoporosis should discuss screening with their health care providers. Ask your health care provider whether you should take a calcium supplement or vitamin D to reduce the rate of osteoporosis.  Menopause can be associated with physical symptoms and risks. Hormone replacement therapy is available to decrease symptoms and risks. You should talk to your health care provider about whether hormone replacement therapy is right for you.  Use sunscreen. Apply sunscreen liberally and repeatedly throughout the day. You should seek shade when your shadow is shorter than you. Protect yourself by wearing long sleeves, pants, a wide-brimmed hat, and sunglasses year round, whenever you are outdoors.  Once a month, do a whole body skin exam, using a mirror to look at the skin on your back. Tell your health care provider of new moles, moles that have irregular borders, moles that  are larger than a pencil eraser, or moles that have changed in shape or color.  Stay current with required vaccines (immunizations).  Influenza vaccine. All adults should be immunized every year.  Tetanus, diphtheria, and acellular pertussis (Td, Tdap) vaccine. Pregnant women should receive 1 dose of Tdap vaccine during each pregnancy. The dose should be obtained regardless of the length of time since the last dose. Immunization is preferred during the 27th-36th week of gestation. An adult who has not previously received Tdap or who does not know her vaccine status should receive 1 dose of Tdap. This initial dose should be followed by tetanus and diphtheria toxoids (Td) booster doses every 10 years. Adults with an unknown or incomplete history of completing a 3-dose immunization series with Td-containing vaccines should begin or complete a primary immunization series including a Tdap dose. Adults should receive a Td booster every 10 years.  Varicella vaccine. An adult without evidence of immunity to varicella should receive 2 doses or a second dose if she has previously received 1 dose. Pregnant females who do not have evidence of immunity should receive the first dose after pregnancy. This first dose should be obtained before leaving the health care facility. The second dose should be obtained 4-8 weeks after the first dose.  Human papillomavirus (HPV) vaccine. Females aged 13-26 years who have not received the vaccine previously should obtain the 3-dose series. The vaccine is not recommended for use in pregnant females. However, pregnancy testing is not needed before receiving a dose. If a female is found to be pregnant after receiving a dose, no treatment is needed. In that case, the remaining doses should be delayed until after the pregnancy. Immunization is recommended for any person with an immunocompromised condition through the age of 61 years if she did not get any or all doses earlier. During the  3-dose series, the second dose should be obtained 4-8 weeks after the first dose. The third dose should be obtained 24 weeks after the first dose and 16 weeks after the second dose.  Zoster vaccine. One dose is recommended for adults aged 30 years or older unless certain conditions are present.  Measles, mumps, and rubella (MMR) vaccine. Adults born  before 1957 generally are considered immune to measles and mumps. Adults born in 1957 or later should have 1 or more doses of MMR vaccine unless there is a contraindication to the vaccine or there is laboratory evidence of immunity to each of the three diseases. A routine second dose of MMR vaccine should be obtained at least 28 days after the first dose for students attending postsecondary schools, health care workers, or international travelers. People who received inactivated measles vaccine or an unknown type of measles vaccine during 1963-1967 should receive 2 doses of MMR vaccine. People who received inactivated mumps vaccine or an unknown type of mumps vaccine before 1979 and are at high risk for mumps infection should consider immunization with 2 doses of MMR vaccine. For females of childbearing age, rubella immunity should be determined. If there is no evidence of immunity, females who are not pregnant should be vaccinated. If there is no evidence of immunity, females who are pregnant should delay immunization until after pregnancy. Unvaccinated health care workers born before 1957 who lack laboratory evidence of measles, mumps, or rubella immunity or laboratory confirmation of disease should consider measles and mumps immunization with 2 doses of MMR vaccine or rubella immunization with 1 dose of MMR vaccine.  Pneumococcal 13-valent conjugate (PCV13) vaccine. When indicated, a person who is uncertain of his immunization history and has no record of immunization should receive the PCV13 vaccine. All adults 65 years of age and older should receive this  vaccine. An adult aged 19 years or older who has certain medical conditions and has not been previously immunized should receive 1 dose of PCV13 vaccine. This PCV13 should be followed with a dose of pneumococcal polysaccharide (PPSV23) vaccine. Adults who are at high risk for pneumococcal disease should obtain the PPSV23 vaccine at least 8 weeks after the dose of PCV13 vaccine. Adults older than 77 years of age who have normal immune system function should obtain the PPSV23 vaccine dose at least 1 year after the dose of PCV13 vaccine.  Pneumococcal polysaccharide (PPSV23) vaccine. When PCV13 is also indicated, PCV13 should be obtained first. All adults aged 65 years and older should be immunized. An adult younger than age 65 years who has certain medical conditions should be immunized. Any person who resides in a nursing home or long-term care facility should be immunized. An adult smoker should be immunized. People with an immunocompromised condition and certain other conditions should receive both PCV13 and PPSV23 vaccines. People with human immunodeficiency virus (HIV) infection should be immunized as soon as possible after diagnosis. Immunization during chemotherapy or radiation therapy should be avoided. Routine use of PPSV23 vaccine is not recommended for American Indians, [place] Natives, or people younger than 65 years unless there are medical conditions that require PPSV23 vaccine. When indicated, people who have unknown immunization and have no record of immunization should receive PPSV23 vaccine. One-time revaccination 5 years after the first dose of PPSV23 is recommended for people aged 19-64 years who have chronic kidney failure, nephrotic syndrome, asplenia, or immunocompromised conditions. People who received 1-2 doses of PPSV23 before age 65 years should receive another dose of PPSV23 vaccine at age 65 years or later if at least 5 years have passed since the previous dose. Doses of PPSV23 are not  needed for people immunized with PPSV23 at or after age 65 years.  Meningococcal vaccine. Adults with asplenia or persistent complement component deficiencies should receive 2 doses of quadrivalent meningococcal conjugate (MenACWY-D) vaccine. The doses should be obtained   at least 2 months apart. Microbiologists working with certain meningococcal bacteria, [place] recruits, people at risk during an outbreak, and people who travel to or live in countries with a high rate of meningitis should be immunized. A first-year college student up through age 34 years who is living in a residence hall should receive a dose if she did not receive a dose on or after her 16th birthday. Adults who have certain high-risk conditions should receive one or more doses of vaccine.  Hepatitis A vaccine. Adults who wish to be protected from this disease, have certain high-risk conditions, work with hepatitis A-infected animals, work in hepatitis A research labs, or travel to or work in countries with a high rate of hepatitis A should be immunized. Adults who were previously unvaccinated and who anticipate close contact with an international adoptee during the first 60 days after arrival in the [place] from a country with a high rate of hepatitis A should be immunized.  Hepatitis B vaccine. Adults who wish to be protected from this disease, have certain high-risk conditions, may be exposed to blood or other infectious body fluids, are household contacts or sex partners of hepatitis B positive people, are clients or workers in certain care facilities, or travel to or work in countries with a high rate of hepatitis B should be immunized.  Haemophilus influenzae type b (Hib) vaccine. A previously unvaccinated person with asplenia or sickle cell disease or having a scheduled splenectomy should receive 1 dose of Hib vaccine. Regardless of previous immunization, a recipient of a hematopoietic stem cell transplant should receive a  3-dose series 6-12 months after her successful transplant. Hib vaccine is not recommended for adults with HIV infection. Preventive Services / Frequency Ages 35 to 4 years  Blood pressure check.** / Every 3-5 years.  Lipid and cholesterol check.** / Every 5 years beginning at age 60.  Clinical breast exam.** / Every 3 years for women in their 71s and 10s.  BRCA-related cancer risk assessment.** / For women who have family members with a BRCA-related cancer (breast, ovarian, tubal, or peritoneal cancers).  Pap test.** / Every 2 years from ages 76 through 26. Every 3 years starting at age 61 through age 76 or 93 with a history of 3 consecutive normal Pap tests.  HPV screening.** / Every 3 years from ages 37 through ages 60 to 51 with a history of 3 consecutive normal Pap tests.  Hepatitis C blood test.** / For any individual with known risks for hepatitis C.  Skin self-exam. / Monthly.  Influenza vaccine. / Every year.  Tetanus, diphtheria, and acellular pertussis (Tdap, Td) vaccine.** / Consult your health care provider. Pregnant women should receive 1 dose of Tdap vaccine during each pregnancy. 1 dose of Td every 10 years.  Varicella vaccine.** / Consult your health care provider. Pregnant females who do not have evidence of immunity should receive the first dose after pregnancy.  HPV vaccine. / 3 doses over 6 months, if 93 and younger. The vaccine is not recommended for use in pregnant females. However, pregnancy testing is not needed before receiving a dose.  Measles, mumps, rubella (MMR) vaccine.** / You need at least 1 dose of MMR if you were born in 1957 or later. You may also need a 2nd dose. For females of childbearing age, rubella immunity should be determined. If there is no evidence of immunity, females who are not pregnant should be vaccinated. If there is no evidence of immunity, females who are  pregnant should delay immunization until after pregnancy.  Pneumococcal  13-valent conjugate (PCV13) vaccine.** / Consult your health care provider.  Pneumococcal polysaccharide (PPSV23) vaccine.** / 1 to 2 doses if you smoke cigarettes or if you have certain conditions.  Meningococcal vaccine.** / 1 dose if you are age 68 to 8 years and a Market researcher living in a residence hall, or have one of several medical conditions, you need to get vaccinated against meningococcal disease. You may also need additional booster doses.  Hepatitis A vaccine.** / Consult your health care provider.  Hepatitis B vaccine.** / Consult your health care provider.  Haemophilus influenzae type b (Hib) vaccine.** / Consult your health care provider. Ages 7 to 53 years  Blood pressure check.** / Every year.  Lipid and cholesterol check.** / Every 5 years beginning at age 25 years.  Lung cancer screening. / Every year if you are aged 11-80 years and have a 30-pack-year history of smoking and currently smoke or have quit within the past 15 years. Yearly screening is stopped once you have quit smoking for at least 15 years or develop a health problem that would prevent you from having lung cancer treatment.  Clinical breast exam.** / Every year after age 48 years.  BRCA-related cancer risk assessment.** / For women who have family members with a BRCA-related cancer (breast, ovarian, tubal, or peritoneal cancers).  Mammogram.** / Every year beginning at age 41 years and continuing for as long as you are in good health. Consult with your health care provider.  Pap test.** / Every 3 years starting at age 65 years through age 37 or 70 years with a history of 3 consecutive normal Pap tests.  HPV screening.** / Every 3 years from ages 72 years through ages 60 to 40 years with a history of 3 consecutive normal Pap tests.  Fecal occult blood test (FOBT) of stool. / Every year beginning at age 21 years and continuing until age 5 years. You may not need to do this test if you get  a colonoscopy every 10 years.  Flexible sigmoidoscopy or colonoscopy.** / Every 5 years for a flexible sigmoidoscopy or every 10 years for a colonoscopy beginning at age 35 years and continuing until age 48 years.  Hepatitis C blood test.** / For all people born from [birth year] through 1965 and any individual with known risks for hepatitis C.  Skin self-exam. / Monthly.  Influenza vaccine. / Every year.  Tetanus, diphtheria, and acellular pertussis (Tdap/Td) vaccine.** / Consult your health care provider. Pregnant women should receive 1 dose of Tdap vaccine during each pregnancy. 1 dose of Td every 10 years.  Varicella vaccine.** / Consult your health care provider. Pregnant females who do not have evidence of immunity should receive the first dose after pregnancy.  Zoster vaccine.** / 1 dose for adults aged 30 years or older.  Measles, mumps, rubella (MMR) vaccine.** / You need at least 1 dose of MMR if you were born in 1957 or later. You may also need a second dose. For females of childbearing age, rubella immunity should be determined. If there is no evidence of immunity, females who are not pregnant should be vaccinated. If there is no evidence of immunity, females who are pregnant should delay immunization until after pregnancy.  Pneumococcal 13-valent conjugate (PCV13) vaccine.** / Consult your health care provider.  Pneumococcal polysaccharide (PPSV23) vaccine.** / 1 to 2 doses if you smoke cigarettes or if you have certain conditions.  Meningococcal vaccine.** /  Consult your health care provider.  Hepatitis A vaccine.** / Consult your health care provider.  Hepatitis B vaccine.** / Consult your health care provider.  Haemophilus influenzae type b (Hib) vaccine.** / Consult your health care provider. Ages 64 years and over  Blood pressure check.** / Every year.  Lipid and cholesterol check.** / Every 5 years beginning at age 23 years.  Lung cancer screening. / Every year if you  are aged 16-80 years and have a 30-pack-year history of smoking and currently smoke or have quit within the past 15 years. Yearly screening is stopped once you have quit smoking for at least 15 years or develop a health problem that would prevent you from having lung cancer treatment.  Clinical breast exam.** / Every year after age 74 years.  BRCA-related cancer risk assessment.** / For women who have family members with a BRCA-related cancer (breast, ovarian, tubal, or peritoneal cancers).  Mammogram.** / Every year beginning at age 44 years and continuing for as long as you are in good health. Consult with your health care provider.  Pap test.** / Every 3 years starting at age 58 years through age 22 or 39 years with 3 consecutive normal Pap tests. Testing can be stopped between 65 and 70 years with 3 consecutive normal Pap tests and no abnormal Pap or HPV tests in the past 10 years.  HPV screening.** / Every 3 years from ages 64 years through ages 70 or 61 years with a history of 3 consecutive normal Pap tests. Testing can be stopped between 65 and 70 years with 3 consecutive normal Pap tests and no abnormal Pap or HPV tests in the past 10 years.  Fecal occult blood test (FOBT) of stool. / Every year beginning at age 40 years and continuing until age 27 years. You may not need to do this test if you get a colonoscopy every 10 years.  Flexible sigmoidoscopy or colonoscopy.** / Every 5 years for a flexible sigmoidoscopy or every 10 years for a colonoscopy beginning at age 7 years and continuing until age 32 years.  Hepatitis C blood test.** / For all people born from [birth year] through 1967 and any individual with known risks for hepatitis C.  Osteoporosis screening.** / A one-time screening for women ages 30 years and over and women at risk for fractures or osteoporosis.  Skin self-exam. / Monthly.  Influenza vaccine. / Every year.  Tetanus, diphtheria, and acellular pertussis (Tdap/Td)  vaccine.** / 1 dose of Td every 10 years.  Varicella vaccine.** / Consult your health care provider.  Zoster vaccine.** / 1 dose for adults aged 35 years or older.  Pneumococcal 13-valent conjugate (PCV13) vaccine.** / Consult your health care provider.  Pneumococcal polysaccharide (PPSV23) vaccine.** / 1 dose for all adults aged 46 years and older.  Meningococcal vaccine.** / Consult your health care provider.  Hepatitis A vaccine.** / Consult your health care provider.  Hepatitis B vaccine.** / Consult your health care provider.  Haemophilus influenzae type b (Hib) vaccine.** / Consult your health care provider. ** Family history and personal history of risk and conditions may change your health care provider's recommendations.   This information is not intended to replace advice given to you by your health care provider. Make sure you discuss any questions you have with your health care provider.   Document Released: 03/22/2001 Document Revised: 02/14/2014 Document Reviewed: 06/21/2010 Elsevier Interactive Patient Education [employer].

## 2015-09-25 NOTE — Assessment & Plan Note (Signed)
Here for medicare wellness/physical  Diet: heart healthy  Physical activity: not sedentary  Depression/mood screen: negative  Hearing: intact to whispered voice  Visual acuity: grossly normal, performs annual eye exam  ADLs: capable  Fall risk: low to none  Home safety: good  Cognitive evaluation: intact to orientation, naming, recall and repetition  EOL planning: adv directives, full code/ I agree  I have personally reviewed and have noted  1. The patient's medical, surgical and social history  2. Their use of alcohol, tobacco or illicit drugs  3. Their current medications and supplements  4. The patient's functional ability including ADL's, fall risks, home safety risks and hearing or visual impairment.  5. Diet and physical activities  6. Evidence for depression or mood disorders 7. The roster of all physicians providing medical care to patient - is listed in the Snapshot section of the chart and reviewed today.    Today patient counseled on age appropriate routine health concerns for screening and prevention, each reviewed and up to date or declined. Immunizations reviewed and up to date or declined. Labs ordered and reviewed. Risk factors for depression reviewed and negative. Hearing function and visual acuity are intact. ADLs screened and addressed as needed. Functional ability and level of safety reviewed and appropriate. Education, counseling and referrals performed based on assessed risks today. Patient provided with a copy of personalized plan for preventive services.  GYN - Dr Ronita Hipps Mammo q 12 mo Colon up to date -- Dr Ivin Booty in [facility]

## 2015-09-25 NOTE — Progress Notes (Signed)
Subjective:  Patient ID: Tanya Walker, female    DOB: 02/16/1940  Age: 75 y.o. MRN: [id number]  CC: No chief complaint on file.   HPI Tanya Walker presents for a well exam  Outpatient Medications Prior to Visit  Medication Sig Dispense Refill  . loratadine (CLARITIN) 10 MG tablet Take 1 tablet (10 mg total) by mouth daily. 100 tablet 3  . Multiple Vitamins-Minerals (PRESERVISION/LUTEIN PO) Take 1 tablet by mouth daily.    Marland Kitchen OVER THE COUNTER MEDICATION Solgar Calcium 600, Vit D 80IU, Mag 300 mg  Take 3 by mouth daily.    Marland Kitchen OVER THE COUNTER MEDICATION Nature's Bounty D3 2000 IU daily.    Marland Kitchen escitalopram (LEXAPRO) 5 MG tablet Take 1 tablet (5 mg total) by mouth daily. 30 tablet 5   No facility-administered medications prior to visit.     ROS Review of Systems  Constitutional: Negative for activity change, appetite change, chills, fatigue and unexpected weight change.  HENT: Negative for congestion, mouth sores and sinus pressure.   Eyes: Negative for visual disturbance.  Respiratory: Negative for cough and chest tightness.   Gastrointestinal: Negative for abdominal pain and nausea.  Genitourinary: Negative for difficulty urinating, frequency and vaginal pain.  Musculoskeletal: Negative for back pain and gait problem.  Skin: Negative for pallor and rash.  Neurological: Negative for dizziness, tremors, weakness, numbness and headaches.  Psychiatric/Behavioral: Negative for confusion and sleep disturbance.    Objective:  BP 140/70 (BP Location: Right Arm, Patient Position: Sitting, Cuff Size: Normal)   Pulse 72   Temp 98 F (36.7 C) (Oral)   Ht 5\' 4"  (1.626 m)   Wt 107 lb 8 oz (48.8 kg)   SpO2 96%   BMI 18.45 kg/m   BP Readings from Last 3 Encounters:  09/25/15 140/70  02/03/15 138/82  07/17/14 136/88    Wt Readings from Last 3 Encounters:  09/25/15 107 lb 8 oz (48.8 kg)  02/03/15 114 lb (51.7 kg)  07/17/14 109 lb (49.4 kg)    Physical Exam  Constitutional: She  appears well-developed. No distress.  HENT:  Head: Normocephalic.  Right Ear: External ear normal.  Left Ear: External ear normal.  Nose: Nose normal.  Mouth/Throat: Oropharynx is clear and moist.  Eyes: Conjunctivae are normal. Pupils are equal, round, and reactive to light. Right eye exhibits no discharge. Left eye exhibits no discharge.  Neck: Normal range of motion. Neck supple. No JVD present. No tracheal deviation present. No thyromegaly present.  Cardiovascular: Normal rate, regular rhythm and normal heart sounds.   Pulmonary/Chest: No stridor. No respiratory distress. She has no wheezes.  Abdominal: Soft. Bowel sounds are normal. She exhibits no distension and no mass. There is no tenderness. There is no rebound and no guarding.  Musculoskeletal: She exhibits no edema or tenderness.  Lymphadenopathy:    She has no cervical adenopathy.  Neurological: She displays normal reflexes. No cranial nerve deficit. She exhibits normal muscle tone. Coordination normal.  Skin: No rash noted. No erythema.  Psychiatric: She has a normal mood and affect. Her behavior is normal. Judgment and thought content normal.    Lab Results  Component Value Date   WBC 6.9 07/12/2013   HGB 14.0 07/12/2013   HCT 41.9 07/12/2013   PLT 154 07/12/2013   GLUCOSE 98 07/12/2013   CHOL 200 10/28/2009   TRIG 58.0 10/28/2009   HDL 55.40 10/28/2009   LDLDIRECT 135.4 12/26/2008   LDLCALC 133 (H) 10/28/2009   ALT 15 10/28/2009  AST 17 10/28/2009   NA 140 07/12/2013   K 4.1 07/12/2013   CL 101 07/12/2013   CREATININE 0.75 07/12/2013   BUN 14 07/12/2013   CO2 30 07/12/2013   TSH 1.69 10/28/2009    No results found.  Assessment & Plan:   There are no diagnoses linked to this encounter. I have discontinued Ms. Baffa escitalopram. I am also having her maintain her Multiple Vitamins-Minerals (PRESERVISION/LUTEIN PO), OVER THE COUNTER MEDICATION, OVER THE COUNTER MEDICATION, and loratadine.  No orders of  the defined types were placed in this encounter.    Follow-up: No Follow-up on file.  Walker Kehr, MD

## 2015-09-30 DIAGNOSIS — I8312 Varicose veins of left lower extremity with inflammation: Secondary | ICD-10-CM | POA: Diagnosis not present

## 2015-09-30 DIAGNOSIS — I8311 Varicose veins of right lower extremity with inflammation: Secondary | ICD-10-CM | POA: Diagnosis not present

## 2015-10-01 ENCOUNTER — Other Ambulatory Visit (INDEPENDENT_AMBULATORY_CARE_PROVIDER_SITE_OTHER): Payer: Medicare Other

## 2015-10-01 ENCOUNTER — Ambulatory Visit (INDEPENDENT_AMBULATORY_CARE_PROVIDER_SITE_OTHER)
Admission: RE | Admit: 2015-10-01 | Discharge: 2015-10-01 | Disposition: A | Discharge status: Home / Self Care | Payer: Medicare Other | Source: Ambulatory Visit | Attending: Internal Medicine | Admitting: Internal Medicine

## 2015-10-01 DIAGNOSIS — Z0000 Encounter for general adult medical examination without abnormal findings: Secondary | ICD-10-CM

## 2015-10-01 DIAGNOSIS — E785 Hyperlipidemia, unspecified: Secondary | ICD-10-CM | POA: Diagnosis not present

## 2015-10-01 DIAGNOSIS — E559 Vitamin D deficiency, unspecified: Secondary | ICD-10-CM | POA: Diagnosis not present

## 2015-10-01 DIAGNOSIS — R634 Abnormal weight loss: Secondary | ICD-10-CM

## 2015-10-01 DIAGNOSIS — M810 Age-related osteoporosis without current pathological fracture: Secondary | ICD-10-CM

## 2015-10-01 LAB — URINALYSIS, ROUTINE W REFLEX MICROSCOPIC
Bilirubin Urine: NEGATIVE
Ketones, ur: NEGATIVE
Nitrite: NEGATIVE
SPECIFIC GRAVITY, URINE: 1.015 (ref 1.000–1.030)
Total Protein, Urine: NEGATIVE
Urine Glucose: NEGATIVE
Urobilinogen, UA: 0.2 (ref 0.0–1.0)
pH: 6 (ref 5.0–8.0)

## 2015-10-01 LAB — BASIC METABOLIC PANEL
BUN: 15 mg/dL (ref 6–23)
CHLORIDE: 106 meq/L (ref 96–112)
CO2: 30 meq/L (ref 19–32)
CREATININE: 0.73 mg/dL (ref 0.40–1.20)
Calcium: 8.9 mg/dL (ref 8.4–10.5)
GFR: 82.19 mL/min (ref >60.00)
Glucose, Bld: 84 mg/dL (ref 70–99)
Potassium: 4.2 mEq/L (ref 3.5–5.1)
Sodium: 142 mEq/L (ref 135–145)

## 2015-10-01 LAB — CBC WITH DIFFERENTIAL/PLATELET
BASOS PCT: 0.6 % (ref 0.0–3.0)
Basophils Absolute: 0 10*3/uL (ref 0.0–0.1)
Eosinophils Absolute: 0.1 10*3/uL (ref 0.0–0.7)
Eosinophils Relative: 1.6 % (ref 0.0–5.0)
HCT: 43 % (ref 36.0–46.0)
Hemoglobin: 14.4 g/dL (ref 12.0–15.0)
Lymphocytes Relative: 31.4 % (ref 12.0–46.0)
Lymphs Abs: 1.8 10*3/uL (ref 0.7–4.0)
MCHC: 33.5 g/dL (ref 30.0–36.0)
MCV: 91.9 fl (ref 78.0–100.0)
MONO ABS: 0.6 10*3/uL (ref 0.1–1.0)
Monocytes Relative: 9.4 % (ref 3.0–12.0)
NEUTROS ABS: 3.3 10*3/uL (ref 1.4–7.7)
Neutrophils Relative %: 57 % (ref 43.0–77.0)
PLATELETS: 161 10*3/uL (ref 150.0–400.0)
RBC: 4.68 Mil/uL (ref 3.87–5.11)
RDW: 13.3 % (ref 11.5–15.5)
WBC: 5.9 10*3/uL (ref 4.0–10.5)

## 2015-10-01 LAB — LIPID PANEL
CHOL/HDL RATIO: 3
Cholesterol: 186 mg/dL (ref 0–200)
HDL: 62.4 mg/dL (ref >39.00)
LDL Cholesterol: 112 mg/dL — ABNORMAL HIGH (ref 0–99)
NONHDL: 123.6
Triglycerides: 59 mg/dL (ref 0.0–149.0)
VLDL: 11.8 mg/dL (ref 0.0–40.0)

## 2015-10-01 LAB — HEPATIC FUNCTION PANEL
ALT: 11 U/L (ref 0–35)
AST: 16 U/L (ref 0–37)
Albumin: 4.1 g/dL (ref 3.5–5.2)
Alkaline Phosphatase: 50 U/L (ref 39–117)
Bilirubin, Direct: 0.1 mg/dL (ref 0.0–0.3)
Total Bilirubin: 0.6 mg/dL (ref 0.2–1.2)
Total Protein: 6.9 g/dL (ref 6.0–8.3)

## 2015-10-01 LAB — VITAMIN D 25 HYDROXY (VIT D DEFICIENCY, FRACTURES): VITD: 34.13 ng/mL (ref 30.00–100.00)

## 2015-10-01 LAB — TSH: TSH: 2.13 u[IU]/mL (ref 0.35–4.50)

## 2015-10-08 ENCOUNTER — Other Ambulatory Visit: Payer: Self-pay

## 2015-10-08 DIAGNOSIS — I8311 Varicose veins of right lower extremity with inflammation: Secondary | ICD-10-CM | POA: Diagnosis not present

## 2015-10-08 DIAGNOSIS — I8312 Varicose veins of left lower extremity with inflammation: Secondary | ICD-10-CM | POA: Diagnosis not present

## 2015-10-16 DIAGNOSIS — I8312 Varicose veins of left lower extremity with inflammation: Secondary | ICD-10-CM | POA: Diagnosis not present

## 2015-10-16 DIAGNOSIS — I8311 Varicose veins of right lower extremity with inflammation: Secondary | ICD-10-CM | POA: Diagnosis not present

## 2015-11-13 DIAGNOSIS — I8312 Varicose veins of left lower extremity with inflammation: Secondary | ICD-10-CM | POA: Diagnosis not present

## 2015-11-13 DIAGNOSIS — I8311 Varicose veins of right lower extremity with inflammation: Secondary | ICD-10-CM | POA: Diagnosis not present

## 2015-11-16 DIAGNOSIS — I8311 Varicose veins of right lower extremity with inflammation: Secondary | ICD-10-CM | POA: Diagnosis not present

## 2015-11-16 DIAGNOSIS — I8312 Varicose veins of left lower extremity with inflammation: Secondary | ICD-10-CM | POA: Diagnosis not present

## 2015-11-17 DIAGNOSIS — I8311 Varicose veins of right lower extremity with inflammation: Secondary | ICD-10-CM | POA: Diagnosis not present

## 2015-11-17 DIAGNOSIS — I8312 Varicose veins of left lower extremity with inflammation: Secondary | ICD-10-CM | POA: Diagnosis not present

## 2015-11-19 DIAGNOSIS — I8312 Varicose veins of left lower extremity with inflammation: Secondary | ICD-10-CM | POA: Diagnosis not present

## 2015-11-19 DIAGNOSIS — I8311 Varicose veins of right lower extremity with inflammation: Secondary | ICD-10-CM | POA: Diagnosis not present

## 2015-12-02 DIAGNOSIS — I8312 Varicose veins of left lower extremity with inflammation: Secondary | ICD-10-CM | POA: Diagnosis not present

## 2015-12-03 DIAGNOSIS — N898 Other specified noninflammatory disorders of vagina: Secondary | ICD-10-CM | POA: Diagnosis not present

## 2015-12-07 ENCOUNTER — Ambulatory Visit (INDEPENDENT_AMBULATORY_CARE_PROVIDER_SITE_OTHER): Payer: Medicare Other

## 2015-12-07 DIAGNOSIS — Z23 Encounter for immunization: Secondary | ICD-10-CM

## 2015-12-11 DIAGNOSIS — Z23 Encounter for immunization: Secondary | ICD-10-CM | POA: Diagnosis not present

## 2015-12-11 DIAGNOSIS — L821 Other seborrheic keratosis: Secondary | ICD-10-CM | POA: Diagnosis not present

## 2015-12-14 ENCOUNTER — Ambulatory Visit: Payer: Medicare Other

## 2015-12-17 ENCOUNTER — Ambulatory Visit: Payer: Medicare Other

## 2015-12-22 ENCOUNTER — Ambulatory Visit (INDEPENDENT_AMBULATORY_CARE_PROVIDER_SITE_OTHER): Payer: Medicare Other | Admitting: General Practice

## 2015-12-22 DIAGNOSIS — Z23 Encounter for immunization: Secondary | ICD-10-CM | POA: Diagnosis not present

## 2015-12-24 ENCOUNTER — Encounter: Payer: Self-pay | Admitting: General Practice

## 2015-12-24 LAB — TB SKIN TEST
Induration: 0 mm
TB Skin Test: NEGATIVE

## 2015-12-25 DIAGNOSIS — I8312 Varicose veins of left lower extremity with inflammation: Secondary | ICD-10-CM | POA: Diagnosis not present

## 2016-01-05 ENCOUNTER — Ambulatory Visit (INDEPENDENT_AMBULATORY_CARE_PROVIDER_SITE_OTHER): Payer: Medicare Other | Admitting: General Practice

## 2016-01-05 DIAGNOSIS — Z23 Encounter for immunization: Secondary | ICD-10-CM | POA: Diagnosis not present

## 2016-01-07 ENCOUNTER — Encounter: Payer: Self-pay | Admitting: General Practice

## 2016-01-07 LAB — TB SKIN TEST
Induration: 0 mm
TB Skin Test: NEGATIVE

## 2016-01-12 DIAGNOSIS — Z681 Body mass index (BMI) 19 or less, adult: Secondary | ICD-10-CM | POA: Diagnosis not present

## 2016-01-12 DIAGNOSIS — Z01419 Encounter for gynecological examination (general) (routine) without abnormal findings: Secondary | ICD-10-CM | POA: Diagnosis not present

## 2016-01-12 DIAGNOSIS — Z124 Encounter for screening for malignant neoplasm of cervix: Secondary | ICD-10-CM | POA: Diagnosis not present

## 2016-01-13 DIAGNOSIS — I8311 Varicose veins of right lower extremity with inflammation: Secondary | ICD-10-CM | POA: Diagnosis not present

## 2016-01-19 DIAGNOSIS — Z0289 Encounter for other administrative examinations: Secondary | ICD-10-CM

## 2016-02-10 DIAGNOSIS — I8311 Varicose veins of right lower extremity with inflammation: Secondary | ICD-10-CM | POA: Diagnosis not present

## 2016-02-19 DIAGNOSIS — L720 Epidermal cyst: Secondary | ICD-10-CM | POA: Diagnosis not present

## 2016-02-19 DIAGNOSIS — Z23 Encounter for immunization: Secondary | ICD-10-CM | POA: Diagnosis not present

## 2016-02-19 DIAGNOSIS — D225 Melanocytic nevi of trunk: Secondary | ICD-10-CM | POA: Diagnosis not present

## 2016-02-19 DIAGNOSIS — D2271 Melanocytic nevi of right lower limb, including hip: Secondary | ICD-10-CM | POA: Diagnosis not present

## 2016-02-19 DIAGNOSIS — Z86018 Personal history of other benign neoplasm: Secondary | ICD-10-CM | POA: Diagnosis not present

## 2016-02-19 DIAGNOSIS — L821 Other seborrheic keratosis: Secondary | ICD-10-CM | POA: Diagnosis not present

## 2016-02-19 DIAGNOSIS — Z85828 Personal history of other malignant neoplasm of skin: Secondary | ICD-10-CM | POA: Diagnosis not present

## 2016-03-31 ENCOUNTER — Encounter: Payer: Self-pay | Admitting: Internal Medicine

## 2016-03-31 ENCOUNTER — Ambulatory Visit (INDEPENDENT_AMBULATORY_CARE_PROVIDER_SITE_OTHER): Payer: Medicare Other | Admitting: Internal Medicine

## 2016-03-31 ENCOUNTER — Ambulatory Visit (INDEPENDENT_AMBULATORY_CARE_PROVIDER_SITE_OTHER)
Admission: RE | Admit: 2016-03-31 | Discharge: 2016-03-31 | Disposition: A | Discharge status: Home / Self Care | Payer: Medicare Other | Source: Ambulatory Visit | Attending: Internal Medicine | Admitting: Internal Medicine

## 2016-03-31 VITALS — BP 90/60 | HR 73 | Temp 98.5°F | Resp 16 | Ht 64.0 in | Wt 108.0 lb

## 2016-03-31 DIAGNOSIS — J3489 Other specified disorders of nose and nasal sinuses: Secondary | ICD-10-CM | POA: Diagnosis not present

## 2016-03-31 DIAGNOSIS — G8929 Other chronic pain: Secondary | ICD-10-CM | POA: Diagnosis not present

## 2016-03-31 DIAGNOSIS — M25572 Pain in left ankle and joints of left foot: Secondary | ICD-10-CM | POA: Insufficient documentation

## 2016-03-31 DIAGNOSIS — I739 Peripheral vascular disease, unspecified: Secondary | ICD-10-CM | POA: Diagnosis not present

## 2016-03-31 DIAGNOSIS — M7989 Other specified soft tissue disorders: Secondary | ICD-10-CM | POA: Diagnosis not present

## 2016-03-31 MED ORDER — MUPIROCIN 2 % EX OINT: TOPICAL_OINTMENT | CUTANEOUS | 0 refills | Status: DC

## 2016-03-31 NOTE — Progress Notes (Signed)
Pre-visit discussion using our clinic review tool. No additional management support is needed unless otherwise documented below in the visit note.  

## 2016-03-31 NOTE — Progress Notes (Signed)
Subjective:  Patient ID: Tanya Walker, female    DOB: May 26, 1940  Age: 76 y.o. MRN: [id number]  CC: Nose Problem (bleeding while blowing nose, has tried nasal spray saline doen't seem to be working) and Leg Swelling (left leg and ankle, numbness has been occurring for about a year )   HPI SWAY MANGAR presents for nose sores C/o leg swelling L; L ankle pain and numbness  Outpatient Medications Prior to Visit  Medication Sig Dispense Refill  . Multiple Vitamins-Minerals (PRESERVISION/LUTEIN PO) Take 1 tablet by mouth daily.    Marland Kitchen OVER THE COUNTER MEDICATION Solgar Calcium 600, Vit D 80IU, Mag 300 mg  Take 3 by mouth daily.    Marland Kitchen OVER THE COUNTER MEDICATION Nature's Bounty D3 2000 IU daily.    Marland Kitchen loratadine (CLARITIN) 10 MG tablet Take 1 tablet (10 mg total) by mouth daily. 100 tablet 3   No facility-administered medications prior to visit.     ROS Review of Systems  Constitutional: Negative for activity change, appetite change, chills, fatigue and unexpected weight change.  HENT: Positive for nosebleeds. Negative for congestion, mouth sores and sinus pressure.   Eyes: Negative for visual disturbance.  Respiratory: Negative for cough and chest tightness.   Cardiovascular: Positive for leg swelling. Negative for chest pain.  Gastrointestinal: Negative for abdominal pain and nausea.  Genitourinary: Negative for difficulty urinating, frequency and vaginal pain.  Musculoskeletal: Positive for arthralgias. Negative for back pain and gait problem.  Skin: Negative for pallor and rash.  Neurological: Positive for numbness. Negative for dizziness, tremors, weakness and headaches.  Psychiatric/Behavioral: Negative for confusion and sleep disturbance.    Objective:  BP 90/60   Pulse 73   Temp 98.5 F (36.9 C) (Oral)   Resp 16   Ht 5\' 4"  (1.626 m)   Wt 108 lb (49 kg)   SpO2 97%   BMI 18.54 kg/m   BP Readings from Last 3 Encounters:  03/31/16 90/60  09/25/15 138/70  02/03/15  138/82    Wt Readings from Last 3 Encounters:  03/31/16 108 lb (49 kg)  09/25/15 107 lb 8 oz (48.8 kg)  02/03/15 114 lb (51.7 kg)    Physical Exam  Constitutional: She appears well-developed. No distress.  HENT:  Head: Normocephalic.  Right Ear: External ear normal.  Left Ear: External ear normal.  Nose: Nose normal.  Mouth/Throat: Oropharynx is clear and moist.  Eyes: Conjunctivae are normal. Pupils are equal, round, and reactive to light. Right eye exhibits no discharge. Left eye exhibits no discharge.  Neck: Normal range of motion. Neck supple. No JVD present. No tracheal deviation present. No thyromegaly present.  Cardiovascular: Normal rate, regular rhythm and normal heart sounds.   Pulmonary/Chest: No stridor. No respiratory distress. She has no wheezes.  Abdominal: Soft. Bowel sounds are normal. She exhibits no distension and no mass. There is no tenderness. There is no rebound and no guarding.  Musculoskeletal: She exhibits edema and tenderness.  Lymphadenopathy:    She has no cervical adenopathy.  Neurological: She displays normal reflexes. No cranial nerve deficit. She exhibits normal muscle tone.  Skin: No rash noted. No erythema.  Psychiatric: She has a normal mood and affect. Her behavior is normal. Judgment and thought content normal.  L ankle slight edema Sore w/crust in the L nostril  Lab Results  Component Value Date   WBC 5.9 10/01/2015   HGB 14.4 10/01/2015   HCT 43.0 10/01/2015   PLT 161.0 10/01/2015   GLUCOSE 84  10/01/2015   CHOL 186 10/01/2015   TRIG 59.0 10/01/2015   HDL 62.40 10/01/2015   LDLDIRECT 135.4 12/26/2008   LDLCALC 112 (H) 10/01/2015   ALT 11 10/01/2015   AST 16 10/01/2015   NA 142 10/01/2015   K 4.2 10/01/2015   CL 106 10/01/2015   CREATININE 0.73 10/01/2015   BUN 15 10/01/2015   CO2 30 10/01/2015   TSH 2.13 10/01/2015    Dg Bone Density  Result Date: 10/03/2015 Date of study: 10/01/15 Exam: DUAL X-RAY ABSORPTIOMETRY (DXA) FOR  BONE MINERAL DENSITY (BMD) Instrument: [employer] Provider: PCP Indication: screening for osteoporosis Comparison: none (please note that it is not possible to compare data from different instruments) Clinical data: Pt is a 76 y.o. female without history of fracture. Results:  Lumbar spine (L1-L4) Femoral neck (FN) 33% distal radius T-score - 3.6 RFN: - 3.3 LFN: - 3.5 n/a Change in BMD from previous DXA test (%) Down 5.3% Down 7.9% n/a (*) statistically significant Assessment: Patient has OSTEOPOROSIS according to the [facility] classification for osteoporosis (see below). Fracture risk: high Comments: the technical quality of the study is good Evaluation for secondary causes should be considered if clinically indicated. Recommend optimizing calcium (1200 mg/day) and vitamin D (800 IU/day). Followup: Repeat BMD is appropriate after 2 years or after 1-2 years if starting treatment. WHO criteria for diagnosis of osteoporosis in postmenopausal women and in men 13 y/o or older: - normal: T-score -1.0 to + 1.0 - osteopenia/low bone density: T-score between -2.5 and -1.0 - osteoporosis: T-score below -2.5 - severe osteoporosis: T-score below -2.5 with history of fragility fracture Note: although not part of the WHO classification, the presence of a fragility fracture, regardless of the T-score, should be considered diagnostic of osteoporosis, provided other causes for the fracture have been excluded. Treatment: The National Osteoporosis Foundation recommends that treatment be considered in postmenopausal women and men age 61 or older with: 1. Hip or vertebral (clinical or morphometric) fracture 2. T-score of - 2.5 or lower at the spine or hip 3. 10-year fracture probability by FRAX of at least 20% for a major osteoporotic fracture and 3% for a hip fracture Loura Pardon MD    Assessment & Plan:   There are no diagnoses linked to this encounter. I have discontinued Ms. Moore loratadine. I am also having  her maintain her Multiple Vitamins-Minerals (PRESERVISION/LUTEIN PO), OVER THE COUNTER MEDICATION, OVER THE COUNTER MEDICATION, and Omega-3 Fatty Acids (OMEGA-3 FISH OIL PO).  Meds ordered this encounter  Medications  . Omega-3 Fatty Acids (OMEGA-3 FISH OIL PO)    Sig: Take 1 capsule by mouth 3 (three) times a week.     Follow-up: No Follow-up on file.  Walker Kehr, MD

## 2016-03-31 NOTE — Patient Instructions (Signed)
Afrin nasal spray 5 days Aleve daily 1 week Ankle brace

## 2016-03-31 NOTE — Assessment & Plan Note (Signed)
venous insuff and OA Aleve X ray Sleeve

## 2016-03-31 NOTE — Assessment & Plan Note (Signed)
Bactroban, Afrin

## 2016-04-03 ENCOUNTER — Encounter: Payer: Self-pay | Admitting: Internal Medicine

## 2016-04-03 DIAGNOSIS — I739 Peripheral vascular disease, unspecified: Secondary | ICD-10-CM | POA: Insufficient documentation

## 2016-04-03 NOTE — Assessment & Plan Note (Signed)
Calcifications  present on the L ankle X ray Art doppler

## 2016-04-04 ENCOUNTER — Telehealth: Payer: Self-pay

## 2016-04-04 DIAGNOSIS — M79605 Pain in left leg: Principal | ICD-10-CM

## 2016-04-04 DIAGNOSIS — M79604 Pain in right leg: Secondary | ICD-10-CM

## 2016-04-04 NOTE — Telephone Encounter (Signed)
Vein and vascular specialist called stated the [place] for Lower Extremity  Arterial Duplex needs an ABI ordered with it. Please send a new order for this and include the ABI. They wont be able to schedule until sometime late March 2018.... Please advise

## 2016-04-05 NOTE — Telephone Encounter (Signed)
Pt called back and she is aware that she needs an ultra sound. If you have anything else to add, please give her a call. She did state she wanted an earlier appt. I will see if I can get her an earlier appt elsewhere.

## 2016-04-06 NOTE — Addendum Note (Signed)
Addended by: Karle Barr on: 04/06/2016 12:21 PM   Modules accepted: Orders

## 2016-04-07 NOTE — Telephone Encounter (Signed)
Ordered U/S

## 2016-04-07 NOTE — Addendum Note (Signed)
Addended by: Cassandria Anger on: 04/07/2016 08:25 AM   Modules accepted: Orders

## 2016-04-15 DIAGNOSIS — R600 Localized edema: Secondary | ICD-10-CM | POA: Diagnosis not present

## 2016-04-15 DIAGNOSIS — I87323 Chronic venous hypertension (idiopathic) with inflammation of bilateral lower extremity: Secondary | ICD-10-CM | POA: Diagnosis not present

## 2016-04-20 ENCOUNTER — Encounter (HOSPITAL_COMMUNITY): Payer: Medicare Other

## 2016-04-22 ENCOUNTER — Ambulatory Visit: Payer: Medicare Other | Admitting: Internal Medicine

## 2016-04-22 ENCOUNTER — Ambulatory Visit (HOSPITAL_COMMUNITY)
Admission: RE | Admit: 2016-04-22 | Discharge: 2016-04-22 | Disposition: A | Discharge status: Home / Self Care | Payer: Medicare Other | Source: Ambulatory Visit | Attending: Cardiovascular Disease | Admitting: Cardiovascular Disease

## 2016-04-22 DIAGNOSIS — I739 Peripheral vascular disease, unspecified: Secondary | ICD-10-CM | POA: Insufficient documentation

## 2016-04-22 DIAGNOSIS — M25572 Pain in left ankle and joints of left foot: Secondary | ICD-10-CM | POA: Diagnosis not present

## 2016-04-22 DIAGNOSIS — G8929 Other chronic pain: Secondary | ICD-10-CM | POA: Insufficient documentation

## 2016-04-22 DIAGNOSIS — R200 Anesthesia of skin: Secondary | ICD-10-CM | POA: Diagnosis not present

## 2016-05-02 ENCOUNTER — Ambulatory Visit (INDEPENDENT_AMBULATORY_CARE_PROVIDER_SITE_OTHER): Payer: Medicare Other | Admitting: Internal Medicine

## 2016-05-02 ENCOUNTER — Encounter: Payer: Self-pay | Admitting: Internal Medicine

## 2016-05-02 DIAGNOSIS — F4323 Adjustment disorder with mixed anxiety and depressed mood: Secondary | ICD-10-CM

## 2016-05-02 DIAGNOSIS — I739 Peripheral vascular disease, unspecified: Secondary | ICD-10-CM

## 2016-05-02 NOTE — Assessment & Plan Note (Addendum)
doppler US was normal Age related arterial calciifications

## 2016-05-02 NOTE — Assessment & Plan Note (Signed)
Doing fair 

## 2016-05-02 NOTE — Progress Notes (Signed)
Pre-visit discussion using our clinic review tool. No additional management support is needed unless otherwise documented below in the visit note.  

## 2016-05-02 NOTE — Progress Notes (Signed)
Subjective:  Patient ID: Tanya Walker, female    DOB: May 31, 1940  Age: 76 y.o. MRN: [id number]  CC: Follow-up (nose sorness stable, no bleeding in nose )   HPI CLEDA IMEL presents for leg numbness- swelling and nose bleeding - resolved. F/u on test results  Outpatient Medications Prior to Visit  Medication Sig Dispense Refill  . Multiple Vitamins-Minerals (PRESERVISION/LUTEIN PO) Take 1 tablet by mouth daily.    . Omega-3 Fatty Acids (OMEGA-3 FISH OIL PO) Take 1 capsule by mouth 3 (three) times a week.    Marland Kitchen OVER THE COUNTER MEDICATION Solgar Calcium 600, Vit D 80IU, Mag 300 mg  Take 3 by mouth daily.    Marland Kitchen OVER THE COUNTER MEDICATION Nature's Bounty D3 2000 IU daily.    . mupirocin ointment (BACTROBAN) 2 % Use qid in nostrils x 7 days 22 g 0   No facility-administered medications prior to visit.     ROS Review of Systems  Constitutional: Negative for activity change, appetite change, chills, fatigue and unexpected weight change.  HENT: Negative for congestion, mouth sores and sinus pressure.   Eyes: Negative for visual disturbance.  Respiratory: Negative for cough and chest tightness.   Gastrointestinal: Negative for abdominal pain and nausea.  Genitourinary: Negative for difficulty urinating, frequency and vaginal pain.  Musculoskeletal: Negative for back pain and gait problem.  Skin: Negative for pallor and rash.  Neurological: Negative for dizziness, tremors, weakness, numbness and headaches.  Psychiatric/Behavioral: Negative for confusion, sleep disturbance and suicidal ideas. The patient is nervous/anxious.     Objective:  BP 128/70   Pulse 65   Temp 97.9 F (36.6 C) (Oral)   Resp 16   Ht 5\' 4"  (1.626 m)   Wt 112 lb (50.8 kg)   SpO2 98%   BMI 19.22 kg/m   BP Readings from Last 3 Encounters:  05/02/16 128/70  03/31/16 90/60  09/25/15 138/70    Wt Readings from Last 3 Encounters:  05/02/16 112 lb (50.8 kg)  03/31/16 108 lb (49 kg)  09/25/15 107 lb 8 oz  (48.8 kg)    Physical Exam  Constitutional: She appears well-developed. No distress.  HENT:  Head: Normocephalic.  Right Ear: External ear normal.  Left Ear: External ear normal.  Nose: Nose normal.  Mouth/Throat: Oropharynx is clear and moist.  Eyes: Conjunctivae are normal. Pupils are equal, round, and reactive to light. Right eye exhibits no discharge. Left eye exhibits no discharge.  Neck: Normal range of motion. Neck supple. No JVD present. No tracheal deviation present. No thyromegaly present.  Cardiovascular: Normal rate, regular rhythm and normal heart sounds.   Pulmonary/Chest: No stridor. No respiratory distress. She has no wheezes.  Abdominal: Soft. Bowel sounds are normal. She exhibits no distension and no mass. There is no tenderness. There is no rebound and no guarding.  Musculoskeletal: She exhibits no edema or tenderness.  Lymphadenopathy:    She has no cervical adenopathy.  Neurological: She displays normal reflexes. No cranial nerve deficit. She exhibits normal muscle tone. Coordination normal.  Skin: No rash noted. No erythema.  Psychiatric: She has a normal mood and affect. Her behavior is normal. Judgment and thought content normal.   FTF>20 min discussing age related arterial calciifications  Lab Results  Component Value Date   WBC 5.9 10/01/2015   HGB 14.4 10/01/2015   HCT 43.0 10/01/2015   PLT 161.0 10/01/2015   GLUCOSE 84 10/01/2015   CHOL 186 10/01/2015   TRIG 59.0 10/01/2015  HDL 62.40 10/01/2015   LDLDIRECT 135.4 12/26/2008   LDLCALC 112 (H) 10/01/2015   ALT 11 10/01/2015   AST 16 10/01/2015   NA 142 10/01/2015   K 4.2 10/01/2015   CL 106 10/01/2015   CREATININE 0.73 10/01/2015   BUN 15 10/01/2015   CO2 30 10/01/2015   TSH 2.13 10/01/2015    No results found.  Assessment & Plan:   There are no diagnoses linked to this encounter. I have discontinued Ms. Folmer mupirocin ointment. I am also having her maintain her Multiple  Vitamins-Minerals (PRESERVISION/LUTEIN PO), OVER THE COUNTER MEDICATION, OVER THE COUNTER MEDICATION, and Omega-3 Fatty Acids (OMEGA-3 FISH OIL PO).  No orders of the defined types were placed in this encounter.    Follow-up: No Follow-up on file.  Walker Kehr, MD

## 2016-06-30 ENCOUNTER — Ambulatory Visit (INDEPENDENT_AMBULATORY_CARE_PROVIDER_SITE_OTHER)
Admission: RE | Admit: 2016-06-30 | Discharge: 2016-06-30 | Disposition: A | Discharge status: Home / Self Care | Payer: Medicare Other | Source: Ambulatory Visit | Attending: Internal Medicine | Admitting: Internal Medicine

## 2016-06-30 ENCOUNTER — Ambulatory Visit (INDEPENDENT_AMBULATORY_CARE_PROVIDER_SITE_OTHER): Payer: Medicare Other | Admitting: Internal Medicine

## 2016-06-30 ENCOUNTER — Encounter: Payer: Self-pay | Admitting: Internal Medicine

## 2016-06-30 DIAGNOSIS — M25571 Pain in right ankle and joints of right foot: Secondary | ICD-10-CM | POA: Diagnosis not present

## 2016-06-30 DIAGNOSIS — S93401A Sprain of unspecified ligament of right ankle, initial encounter: Secondary | ICD-10-CM | POA: Diagnosis not present

## 2016-06-30 DIAGNOSIS — M8589 Other specified disorders of bone density and structure, multiple sites: Secondary | ICD-10-CM | POA: Diagnosis not present

## 2016-06-30 DIAGNOSIS — S93409A Sprain of unspecified ligament of unspecified ankle, initial encounter: Secondary | ICD-10-CM | POA: Insufficient documentation

## 2016-06-30 NOTE — Assessment & Plan Note (Signed)
X ray ACE wrap Ibuprofen prn

## 2016-06-30 NOTE — Progress Notes (Signed)
   Subjective:  Patient ID: Tanya Walker, female    DOB: January 03, 1941  Age: 76 y.o. MRN: [id number]  CC: No chief complaint on file.   HPI Tanya Walker presents for R ankle twist 10 d ago  Outpatient Medications Prior to Visit  Medication Sig Dispense Refill  . Multiple Vitamins-Minerals (PRESERVISION/LUTEIN PO) Take 1 tablet by mouth daily.    . Omega-3 Fatty Acids (OMEGA-3 FISH OIL PO) Take 1 capsule by mouth 3 (three) times a week.    Marland Kitchen OVER THE COUNTER MEDICATION Solgar Calcium 600, Vit D 80IU, Mag 300 mg  Take 3 by mouth daily.    Marland Kitchen OVER THE COUNTER MEDICATION Nature's Bounty D3 2000 IU daily.     No facility-administered medications prior to visit.     ROS Review of Systems  Constitutional: Negative for fatigue.  Musculoskeletal: Positive for gait problem and joint swelling.    Objective:  BP 130/78 (BP Location: Left Arm, Patient Position: Sitting, Cuff Size: Normal)   Pulse 65   Temp 98.1 F (36.7 C) (Oral)   Ht 5\' 4"  (1.626 m)   Wt 109 lb (49.4 kg)   SpO2 98%   BMI 18.71 kg/m   BP Readings from Last 3 Encounters:  06/30/16 130/78  05/02/16 128/70  03/31/16 90/60    Wt Readings from Last 3 Encounters:  06/30/16 109 lb (49.4 kg)  05/02/16 112 lb (50.8 kg)  03/31/16 108 lb (49 kg)    Physical Exam  Constitutional: No distress.  Cardiovascular: Normal rate and normal heart sounds.   Pulmonary/Chest: She has no rales.  Abdominal: There is no tenderness.  Musculoskeletal: She exhibits edema and tenderness.  Neurological: Coordination normal.   R ankle and prox foot is swollen and tender  Lab Results  Component Value Date   WBC 5.9 10/01/2015   HGB 14.4 10/01/2015   HCT 43.0 10/01/2015   PLT 161.0 10/01/2015   GLUCOSE 84 10/01/2015   CHOL 186 10/01/2015   TRIG 59.0 10/01/2015   HDL 62.40 10/01/2015   LDLDIRECT 135.4 12/26/2008   LDLCALC 112 (H) 10/01/2015   ALT 11 10/01/2015   AST 16 10/01/2015   NA 142 10/01/2015   K 4.2 10/01/2015   CL 106  10/01/2015   CREATININE 0.73 10/01/2015   BUN 15 10/01/2015   CO2 30 10/01/2015   TSH 2.13 10/01/2015    No results found.  Assessment & Plan:   There are no diagnoses linked to this encounter. I am having Ms. Goertzen maintain her Multiple Vitamins-Minerals (PRESERVISION/LUTEIN PO), OVER THE COUNTER MEDICATION, OVER THE COUNTER MEDICATION, and Omega-3 Fatty Acids (OMEGA-3 FISH OIL PO).  No orders of the defined types were placed in this encounter.    Follow-up: No Follow-up on file.  Walker Kehr, MD

## 2016-06-30 NOTE — Patient Instructions (Signed)
Ankle Sprain An ankle sprain is a stretch or tear in one of the tough tissues (ligaments) in your ankle. Follow these instructions at home:  Rest your ankle.  Take over-the-counter and prescription medicines only as told by your doctor.  For 2-3 days, keep your ankle higher than the level of your heart (elevated) as much as possible.  If directed, put ice on the area:  Put ice in a plastic bag.  Place a towel between your skin and the bag.  Leave the ice on for 20 minutes, 2-3 times a day.  If you were given a brace:  Wear it as told.  Take it off to shower or bathe.  Try not to move your ankle much, but wiggle your toes from time to time. This helps to prevent swelling.  If you were given an elastic bandage (dressing):  Take it off when you shower or bathe.  Try not to move your ankle much, but wiggle your toes from time to time. This helps to prevent swelling.  Adjust the bandage to make it more comfortable if it feels too tight.  Loosen the bandage if you lose feeling in your foot, your foot tingles, or your foot gets cold and blue.  If you have crutches, use them as told by your doctor. Continue to use them until you can walk without feeling pain in your ankle. Contact a doctor if:  Your bruises or swelling are quickly getting worse.  Your pain does not get better after you take medicine. Get help right away if:  You cannot feel your toes or foot.  Your toes or your foot looks blue.  You have very bad pain that gets worse. This information is not intended to replace advice given to you by your health care provider. Make sure you discuss any questions you have with your health care provider. Document Released: 07/13/2007 Document Revised: 07/02/2015 Document Reviewed: 08/26/2014 Elsevier Interactive Patient Education  2017 [employer].

## 2016-06-30 NOTE — Addendum Note (Signed)
Addended by: Cassandria Anger on: 06/30/2016 12:41 PM   Modules accepted: Orders

## 2016-07-04 ENCOUNTER — Other Ambulatory Visit: Payer: Self-pay | Admitting: Internal Medicine

## 2016-07-04 DIAGNOSIS — S82891D Other fracture of right lower leg, subsequent encounter for closed fracture with routine healing: Secondary | ICD-10-CM

## 2016-07-07 NOTE — Progress Notes (Signed)
[name] [place], [place] [address] Phone: [phone] Subjective:    I'm seeing this patient by the request  of:    CC: Right ankle pain and injury  [id number]  Tanya Walker is a 76 y.o. female coming in with complaint of right ankle Pain. This has been multiple weeks ago. Patient states that unfortunately she tripped and fallen. Significant bruising as well as swelling. Patient states that she was unable to walk for one day. Was able to then start bearing weight. Over the course of time and has improved a little bit. Patient did see another provider. Patient did have x-rays. X-rays showed a potential for an avulsion fracture. Patient states it is improving slowly   patient did have x-rays of the right ankle and Independently visualized by me. Right ankle showed what appeared to be an avulsion fracture possibly of the medial inferior aspect of the talus Left ankle x-rays have been unremarkable from February 2018.  Past Medical History:  Diagnosis Date  . Anxiety    no meds  . Cancer ([place])    skin  . Lipoma of arm    Right  . Medical history non-contributory   . Osteoporosis   . SVD (spontaneous vaginal delivery)    x 2  . Varicose vein   . Vitamin D deficiency    Past Surgical History:  Procedure Laterality Date  . APPENDECTOMY  1947  . CHOLECYSTECTOMY  1988  . GANGLION CYST EXCISION     x2 left and right arm  . HYSTEROSCOPY W/D&C N/A 07/15/2013   Procedure: DILATATION AND CURETTAGE /HYSTEROSCOPY;  Surgeon: Lovenia Kim, MD;  Location: [facility];  Service: Gynecology;  Laterality: N/A;  . [place]  . WISDOM TOOTH EXTRACTION     Social History   Social History  . Marital status: Married    Spouse name: N/A  . Number of children: N/A  . Years of education: N/A   Social History Main Topics  . Smoking status: Never Smoker  . Smokeless tobacco: Never Used  . Alcohol use No  . Drug use: No  . Sexual  activity: Yes    Birth control/ protection: Post-menopausal   Other Topics Concern  . None   Social History Narrative   GI in [place] for colonoscopy   Dr Ronita Hipps - GYN      Denies Surgical history      Family history of varicose veins   Mother is 58      Regular exercise - NO   Allergies  Allergen Reactions  . Alendronate Sodium Other (See Comments)    Leg cramps  . Risedronate Sodium     REACTION: ?? achy   No family history on file.  Past medical history, social, surgical and family history all reviewed in electronic medical record.  No pertanent information unless stated regarding to the chief complaint.   Review of Systems:Review of systems updated and as accurate as of 07/08/16  No headache, visual changes, nausea, vomiting, diarrhea, constipation, dizziness, abdominal pain, skin rash, fevers, chills, night sweats, weight loss, swollen lymph nodes, body aches, joint swelling, muscle aches, chest pain, shortness of breath, mood changes.   Objective  Blood pressure 126/80, pulse 66, height 5\' 4"  (1.626 m), weight 108 lb (49 kg), SpO2 97 %. Systems examined below as of 07/08/16   General: No apparent distress alert and oriented x3 mood and affect normal, dressed appropriately.  HEENT: Pupils  equal, extraocular movements intact  Respiratory: Patient's speak in full sentences and does not appear short of breath  Cardiovascular: No lower extremity edema, non tender, no erythema  Skin: Warm dry intact with no signs of infection or rash on extremities or on axial skeleton.  Abdomen: Soft nontender  Neuro: Cranial nerves II through XII are intact, neurovascularly intact in all extremities with 2+ DTRs and 2+ pulses.  Lymph: No lymphadenopathy of posterior or anterior cervical chain or axillae bilaterally.  Gait Mild antalgic gait MSK:  Non tender with full range of motion and good stability and symmetric strength and tone of shoulders, elbows, wrist, hip, knees bilaterally. Mild  arthritic changes of multiple joints Moderate pes planus  Ankle:right  No visible erythema or swelling. Range of motion is full in all directions. Strength is 5/5 in all directions. Stable lateral and medial ligaments; squeeze test and kleiger test unremarkable; Talar dome minimal tenderness; No pain at base of 5th MT; No tenderness over cuboid; No tenderness over N spot or navicular prominence No tenderness on posterior aspects of lateral and medial malleolus No sign of peroneal tendon subluxations or tenderness to palpation Negative tarsal tunnel tinel's Able to walk 4 steps.  MSK US performed of: Right ankle This study was ordered, performed, and interpreted by Charlann Boxer D.O.  Foot/Ankle:   Talar dome does have area of callus on the medial aspect. No full vision noted at this point. No joint effusion. Patient does have reactive posterior tibialis tendon sheath swelling Ankle mortise without effusion. Peroneus longus and brevis tendons unremarkable on long and transverse views without sheath effusions.  Impression: Healing fracture, talar dome reactive posterior tibialis tendinitis  Procedure note 27078; 15 minutes spent for Therapeutic exercises as stated in above notes.  This included exercises focusing on stretching, strengthening, with significant focus on eccentric aspects.  Ankle strengthening that included:  Basic range of motion exercises to allow proper full motion at ankle Stretching of the lower leg and hamstrings  Theraband exercises for the lower leg - inversion, eversion, dorsiflexion and plantarflexion each to be completed with a theraband Balance exercises to increase proprioception Weight bearing exercises to increase strength and balance  Proper technique shown and discussed handout in great detail with ATC.  All questions were discussed and answered.     Impression and Recommendations:     This case required medical decision making of moderate  complexity.      Note: This dictation was prepared with Dragon dictation along with smaller phrase technology. Any transcriptional errors that result from this process are unintentional.

## 2016-07-08 ENCOUNTER — Ambulatory Visit: Payer: Self-pay

## 2016-07-08 ENCOUNTER — Encounter: Payer: Self-pay | Admitting: Family Medicine

## 2016-07-08 ENCOUNTER — Ambulatory Visit (INDEPENDENT_AMBULATORY_CARE_PROVIDER_SITE_OTHER): Payer: Medicare Other | Admitting: Family Medicine

## 2016-07-08 VITALS — BP 126/80 | HR 66 | Ht 64.0 in | Wt 108.0 lb

## 2016-07-08 DIAGNOSIS — M25571 Pain in right ankle and joints of right foot: Secondary | ICD-10-CM | POA: Diagnosis not present

## 2016-07-08 DIAGNOSIS — M76821 Posterior tibial tendinitis, right leg: Secondary | ICD-10-CM | POA: Diagnosis not present

## 2016-07-08 NOTE — Assessment & Plan Note (Signed)
Patient does have what appears to be more of a posterior tibialis tendinitis. Think this reactive patient's previous injury and increasing activity. We discussed icing regimen and home exercises. We discussed objective recently do a which was to avoid. Patient was given a home exercise program. In addition of this patient was also given stability brace. Follow-up again in 4 weeks

## 2016-07-08 NOTE — Patient Instructions (Signed)
Good to see you  Ice 20 minutes 2 times daily. Usually after activity and before bed. Air cast daily for 2 weeks when out of the house Exercises 3 times a week.  pennsaid pinkie amount topically 2 times daily as needed.  Once weekly vitamin D for 12 weeks.  Stay active.  [employer] orthotics "total support" online would be great  Good shoes with rigid bottom.  Jalene Mullet, [place] or New balance greater then 700 or [place] - your daughter was right on the shoes ;) See me again in 4 weeks.

## 2016-07-11 ENCOUNTER — Telehealth: Payer: Self-pay | Admitting: Family Medicine

## 2016-07-11 NOTE — Telephone Encounter (Signed)
Spoke with patient. She isn't sure that she is putting the brace on correctly. She is going to come in on 6/5 at 9:00am for another fitting.

## 2016-07-11 NOTE — Telephone Encounter (Signed)
Patient was given an air cast last week.  Patient does not believe she is putting this on correctly.  Patient did have additional swelling over the weekend to cause her to believe this.  Patient would like to know if she can come in to be shown have to wear this again?

## 2016-07-12 ENCOUNTER — Other Ambulatory Visit: Payer: Self-pay

## 2016-07-12 MED ORDER — VITAMIN D (ERGOCALCIFEROL) 1.25 MG (50000 UNIT) PO CAPS: 50000.0000 [IU] | ORAL_CAPSULE | ORAL | 0 refills | Status: DC

## 2016-07-12 NOTE — Progress Notes (Unsigned)
Vitamin

## 2016-07-12 NOTE — Telephone Encounter (Signed)
Patient came into the office this morning to have her brace fitted. She was having some irritation on the superior aspect of brace. Recommended that she loosen the top proximal strap but keep the distal strap tighter. She said that the irritation goes away after she removes the brace. She also pointed out on her check out sheet that she was suppose to receive an rx for once weekly vitamin d so I sent it into her pharmacy per the last clinical note.

## 2016-08-04 ENCOUNTER — Ambulatory Visit: Payer: Medicare Other | Admitting: Family Medicine

## 2016-08-08 DIAGNOSIS — Z1231 Encounter for screening mammogram for malignant neoplasm of breast: Secondary | ICD-10-CM | POA: Diagnosis not present

## 2016-08-11 ENCOUNTER — Ambulatory Visit (INDEPENDENT_AMBULATORY_CARE_PROVIDER_SITE_OTHER): Payer: Medicare Other | Admitting: Family Medicine

## 2016-08-11 ENCOUNTER — Encounter: Payer: Self-pay | Admitting: Family Medicine

## 2016-08-11 DIAGNOSIS — M76821 Posterior tibial tendinitis, right leg: Secondary | ICD-10-CM

## 2016-08-11 NOTE — Assessment & Plan Note (Signed)
Results at this time. No significant changes in management. Discussed if worsening symptoms come back for further evaluation and treatment.

## 2016-08-11 NOTE — Progress Notes (Signed)
[facility] [place], [place] [address] Phone: [phone] Subjective:    I'm seeing this patient by the request  of:    CC: Right ankle pain and injury  [id number]  VERBLE STYRON is a 76 y.o. female coming in with complaint of right ankle Pain. This has been multiple weeks ago. Patient states that unfortunately she tripped and fallen. Significant bruising as well as swelling. Patient states that she was unable to walk for one day. Was able to then start bearing weight. Over the course of time and has improved a little bit. Patient did see another provider. Patient did have x-rays. X-rays showed a potential for an avulsion fracture.  Patient is doing much better. Has been doing the exercises. It is increasing activity slowly. Denies any numbness or tingling. Patient states that the left foot may be hurting more than the right foot. Patient states though that it'll do daily activities and this does not stop her.   patient did have x-rays of the right ankle and Independently visualized by me. Right ankle showed what appeared to be an avulsion fracture possibly of the medial inferior aspect of the talus Left ankle x-rays have been unremarkable from February 2018.  Past Medical History:  Diagnosis Date  . Anxiety    no meds  . Cancer ([place])    skin  . Lipoma of arm    Right  . Medical history non-contributory   . Osteoporosis   . SVD (spontaneous vaginal delivery)    x 2  . Varicose vein   . Vitamin D deficiency    Past Surgical History:  Procedure Laterality Date  . APPENDECTOMY  1947  . CHOLECYSTECTOMY  1988  . GANGLION CYST EXCISION     x2 left and right arm  . HYSTEROSCOPY W/D&C N/A 07/15/2013   Procedure: DILATATION AND CURETTAGE /HYSTEROSCOPY;  Surgeon: Lovenia Kim, MD;  Location: [facility];  Service: Gynecology;  Laterality: N/A;  . [place]  . WISDOM TOOTH EXTRACTION     Social History   Social History  .  Marital status: Married    Spouse name: N/A  . Number of children: N/A  . Years of education: N/A   Social History Main Topics  . Smoking status: Never Smoker  . Smokeless tobacco: Never Used  . Alcohol use No  . Drug use: No  . Sexual activity: Yes    Birth control/ protection: Post-menopausal   Other Topics Concern  . None   Social History Narrative   GI in [place] for colonoscopy   Dr Ronita Hipps - GYN      Denies Surgical history      Family history of varicose veins   Mother is 70      Regular exercise - NO   Allergies  Allergen Reactions  . Alendronate Sodium Other (See Comments)    Leg cramps  . Risedronate Sodium     REACTION: ?? achy   No family history on file.  Past medical history, social, surgical and family history all reviewed in electronic medical record.  No pertanent information unless stated regarding to the chief complaint.   Review of Systems: No headache, visual changes, nausea, vomiting, diarrhea, constipation, dizziness, abdominal pain, skin rash, fevers, chills, night sweats, weight loss, swollen lymph nodes, body aches, joint swelling, muscle aches, chest pain, shortness of breath, mood changes.    Objective  Blood pressure 114/78, pulse 70, height 5\' 4"  (1.626  m), weight 107 lb (48.5 kg), SpO2 97 %.   Systems examined below as of 08/11/16 General: NAD A&O x3 mood, affect normal  HEENT: Pupils equal, extraocular movements intact no nystagmus Respiratory: not short of breath at rest or with speaking Cardiovascular: No lower extremity edema, non tender Skin: Warm dry intact with no signs of infection or rash on extremities or on axial skeleton. Abdomen: Soft nontender, no masses Neuro: Cranial nerves  intact, neurovascularly intact in all extremities with 2+ DTRs and 2+ pulses. Lymph: No lymphadenopathy appreciated today  Gait normal with good balance and coordination.  MSK: Non tender with full range of motion and good stability and symmetric  strength and tone of shoulders, elbows, wrist,  knee hips and bilaterally.  Mild arthritic changes of multiple joints  Ankle:right  No visible erythema or swelling. Arthritic changes noted Range of motion is full in all directions. Strength is 5/5 in all directions. Stable lateral and medial ligaments; squeeze test and kleiger test unremarkable; Talar dome nontender; No pain at base of 5th MT; No tenderness over cuboid; No tenderness over N spot or navicular prominence No tenderness on posterior aspects of lateral and medial malleolus No sign of peroneal tendon subluxations or tenderness to palpation Negative tarsal tunnel tinel's Able to walk 4 steps. 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      Note: This dictation was prepared with Dragon dictation along with smaller phrase technology. Any transcriptional errors that result from this process are unintentional.

## 2016-08-11 NOTE — Patient Instructions (Signed)
Good to see you  I am sorry about your worser half.  HOKA shoes would be great  pennsaid pinkie amount topically 2 times daily as needed.   Stay active See me again when you need me.

## 2016-09-12 DIAGNOSIS — H2513 Age-related nuclear cataract, bilateral: Secondary | ICD-10-CM | POA: Diagnosis not present

## 2016-09-12 DIAGNOSIS — H5203 Hypermetropia, bilateral: Secondary | ICD-10-CM | POA: Diagnosis not present

## 2016-09-12 DIAGNOSIS — H353132 Nonexudative age-related macular degeneration, bilateral, intermediate dry stage: Secondary | ICD-10-CM | POA: Diagnosis not present

## 2016-09-28 NOTE — Progress Notes (Signed)
Pre visit review using our clinic review tool, if applicable. No additional management support is needed unless otherwise documented below in the visit note. 

## 2016-09-28 NOTE — Progress Notes (Signed)
Subjective:   Tanya Walker is a 76 y.o. female who presents for Medicare Annual (Subsequent) preventive examination.  Review of Systems:  No ROS.  Medicare Wellness Visit. Additional risk factors are reflected in the social history.  Cardiac Risk Factors include: advanced age (>72men, >55 women) Sleep patterns: feels rested on waking, gets up 1 times nightly to void and sleeps 6-7 hours nightly.    Home Safety/Smoke Alarms: Feels safe in home. Smoke alarms in place.  Living environment; residence and Firearm Safety: 1-story house/ trailer, no firearms. Lives at [facility], Lives with husbanc, no needs for DME, good support system Seat Belt Safety/Bike Helmet: Wears seat belt.     Objective:     Vitals: BP 128/64   Pulse 68   Resp 20   Ht 5\' 4"  (1.626 m)   Wt 106 lb (48.1 kg)   SpO2 97%   BMI 18.19 kg/m   Body mass index is 18.19 kg/m.   Tobacco History  Smoking Status  . Never Smoker  Smokeless Tobacco  . Never Used     Counseling given: Not Answered   Past Medical History:  Diagnosis Date  . Anxiety    no meds  . Cancer ([place])    skin  . Lipoma of arm    Right  . Medical history non-contributory   . Osteoporosis   . SVD (spontaneous vaginal delivery)    x 2  . Varicose vein   . Vitamin D deficiency    Past Surgical History:  Procedure Laterality Date  . APPENDECTOMY  1947  . CHOLECYSTECTOMY  1988  . GANGLION CYST EXCISION     x2 left and right arm  . HYSTEROSCOPY W/D&C N/A 07/15/2013   Procedure: DILATATION AND CURETTAGE /HYSTEROSCOPY;  Surgeon: Lovenia Kim, MD;  Location: [facility];  Service: Gynecology;  Laterality: N/A;  . [place]  . WISDOM TOOTH EXTRACTION     History reviewed. No pertinent family history. History  Sexual Activity  . Sexual activity: Yes  . Birth control/ protection: Post-menopausal    Outpatient Encounter Prescriptions as of 09/29/2016  Medication Sig  . OVER THE COUNTER MEDICATION Solgar Calcium 600,  Vit D 80IU, Mag 300 mg  Take 3 by mouth daily.  Marland Kitchen OVER THE COUNTER MEDICATION Nature's Bounty D3 2000 IU daily.  . Vitamin D, Ergocalciferol, (DRISDOL) 50000 units CAPS capsule Take 1 capsule (50,000 Units total) by mouth every 7 (seven) days.   No facility-administered encounter medications on file as of 09/29/2016.     Activities of Daily Living In your present state of health, do you have any difficulty performing the following activities: 09/29/2016  Hearing? N  Vision? N  Difficulty concentrating or making decisions? N  Walking or climbing stairs? N  Dressing or bathing? N  Doing errands, shopping? N  Preparing Food and eating ? N  Using the Toilet? N  In the past six months, have you accidently leaked urine? N  Do you have problems with loss of bowel control? N  Managing your Medications? N  Managing your Finances? N  Housekeeping or managing your Housekeeping? N  Some recent data might be hidden    Patient Care Team: Plotnikov, [name], MD as PCP - General    Assessment:    Physical assessment deferred to PCP.  Exercise Activities and Dietary recommendations Current Exercise Habits: Home exercise routine, Type of exercise: walking, Time (Minutes): 30, Frequency (Times/Week): 4, Weekly Exercise (Minutes/Week): 120, Intensity: Mild,  Exercise limited by: orthopedic condition(s)  Diet (meal preparation, eat out, water intake, caffeinated beverages, dairy products, fruits and vegetables): in general, a "healthy" diet  , well balanced eats a variety of fruits and vegetables daily, limits salt, fat/cholesterol, sugar, caffeine, drinks 2-3 glasses of water daily.  Encouraged patient to increase daily water intake.    Goals    . Increase exercise          Called the gym facility at [facility] to be shown proper technique and how to use equipment. Start to work out 2 times weekly.       Fall Risk Fall Risk  09/29/2016 10/08/2015 07/17/2014  Falls in the past year? No No  No  Comment - Emmi Telephone Survey: data to providers prior to load -   Depression Screen PHQ 2/9 Scores 09/29/2016 07/17/2014  PHQ - 2 Score 0 0  PHQ- 9 Score 1 -     Cognitive Function       Ad8 score reviewed for issues:  Issues making decisions: no  Less interest in hobbies / activities: no  Repeats questions, stories (family complaining): no  Trouble using ordinary gadgets (microwave, computer, phone):no  Forgets the month or year: no  Mismanaging finances: no  Remembering appts: no  Daily problems with thinking and/or memory: no Ad8 score is= 0    Immunization History  Administered Date(s) Administered  . DT 04/10/2002  . Influenza Split 03/08/2011, 11/17/2011  . Influenza Whole 01/09/2007, 11/05/2008, 11/04/2009  . Influenza, High Dose Seasonal PF 12/07/2015  . Influenza-Unspecified 10/30/2012, 11/29/2013  . PPD Test 12/22/2015, 01/05/2016  . Pneumococcal Conjugate-13 07/17/2014  . Pneumococcal Polysaccharide-23 08/15/2005, 09/25/2015  . Tetanus 12/24/2012   Screening Tests Health Maintenance  Topic Date Due  . INFLUENZA VACCINE  09/07/2016  . TETANUS/TDAP  12/25/2022  . DEXA SCAN  Completed  . PNA vac Low Risk Adult  Completed      Plan:     Continue doing brain stimulating activities (puzzles, reading, adult coloring books, staying active) to keep memory sharp.   Continue to eat heart healthy diet (full of fruits, vegetables, whole grains, lean protein, water--limit salt, fat, and sugar intake) and increase physical activity as tolerated.   I have personally reviewed and noted the following in the patient's chart:   . Medical and social history . Use of alcohol, tobacco or illicit drugs  . Current medications and supplements . Functional ability and status . Nutritional status . Physical activity . Advanced directives . List of other physicians . Vitals . Screenings to include cognitive, depression, and falls . Referrals and  appointments  In addition, I have reviewed and discussed with patient certain preventive protocols, quality metrics, and best practice recommendations. A written personalized care plan for preventive services as well as general preventive health recommendations were provided to patient.     Michiel Cowboy, RN  09/29/2016

## 2016-09-29 ENCOUNTER — Ambulatory Visit (INDEPENDENT_AMBULATORY_CARE_PROVIDER_SITE_OTHER): Payer: Medicare Other | Admitting: *Deleted

## 2016-09-29 VITALS — BP 128/64 | HR 68 | Resp 20 | Ht 64.0 in | Wt 106.0 lb

## 2016-09-29 DIAGNOSIS — Z23 Encounter for immunization: Secondary | ICD-10-CM

## 2016-09-29 DIAGNOSIS — Z0000 Encounter for general adult medical examination without abnormal findings: Secondary | ICD-10-CM

## 2016-09-29 NOTE — Patient Instructions (Addendum)
Continue doing brain stimulating activities (puzzles, reading, adult coloring books, staying active) to keep memory sharp.   Continue to eat heart healthy diet (full of fruits, vegetables, whole grains, lean protein, water--limit salt, fat, and sugar intake) and increase physical activity as tolerated.   Tanya Walker , Thank you for taking time to come for your Medicare Wellness Visit. I appreciate your ongoing commitment to your health goals. Please review the following plan we discussed and let me know if I can assist you in the future.   These are the goals we discussed: Goals    . Increase exercise          Called the gym facility at [facility] to be shown proper technique and how to use equipment. Start to work out 2 times weekly.        This is a list of the screening recommended for you and due dates:  Health Maintenance  Topic Date Due  . Flu Shot  09/07/2016  . Tetanus Vaccine  12/25/2022  . DEXA scan (bone density measurement)  Completed  . Pneumonia vaccines  Completed    Influenza Virus Vaccine (Flucelvax) What is this medicine? INFLUENZA VIRUS VACCINE (in floo EN zuh VAHY ruhs vak SEEN) helps to reduce the risk of getting influenza also known as the flu. The vaccine only helps protect you against some strains of the flu. This medicine may be used for other purposes; ask your health care provider or pharmacist if you have questions. COMMON BRAND NAME(S): FLUCELVAX What should I tell my health care provider before I take this medicine? They need to know if you have any of these conditions: -bleeding disorder like hemophilia -fever or infection -Guillain-Barre syndrome or other neurological problems -immune system problems -infection with the human immunodeficiency virus (HIV) or AIDS -low blood platelet counts -multiple sclerosis -an unusual or allergic reaction to influenza virus vaccine, other medicines, foods, dyes or preservatives -pregnant or trying to get  pregnant -breast-feeding How should I use this medicine? This vaccine is for injection into a muscle. It is given by a health care professional. A copy of Vaccine Information Statements will be given before each vaccination. Read this sheet carefully each time. The sheet may change frequently. Talk to your pediatrician regarding the use of this medicine in children. Special care may be needed. Overdosage: If you think you've taken too much of this medicine contact a poison control center or emergency room at once. Overdosage: If you think you have taken too much of this medicine contact a poison control center or emergency room at once. NOTE: This medicine is only for you. Do not share this medicine with others. What if I miss a dose? This does not apply. What may interact with this medicine? -chemotherapy or radiation therapy -medicines that lower your immune system like etanercept, anakinra, infliximab, and adalimumab -medicines that treat or prevent blood clots like warfarin -phenytoin -steroid medicines like prednisone or cortisone -theophylline -vaccines This list may not describe all possible interactions. Give your health care provider a list of all the medicines, herbs, non-prescription drugs, or dietary supplements you use. Also tell them if you smoke, drink alcohol, or use illegal drugs. Some items may interact with your medicine. What should I watch for while using this medicine? Report any side effects that do not go away within 3 days to your doctor or health care professional. Call your health care provider if any unusual symptoms occur within 6 weeks of receiving this vaccine. You  may still catch the flu, but the illness is not usually as bad. You cannot get the flu from the vaccine. The vaccine will not protect against colds or other illnesses that may cause fever. The vaccine is needed every year. What side effects may I notice from receiving this medicine? Side effects that  you should report to your doctor or health care professional as soon as possible: -allergic reactions like skin rash, itching or hives, swelling of the face, lips, or tongue Side effects that usually do not require medical attention (Report these to your doctor or health care professional if they continue or are bothersome.): -fever -headache -muscle aches and pains -pain, tenderness, redness, or swelling at the injection site -tiredness This list may not describe all possible side effects. Call your doctor for medical advice about side effects. You may report side effects to FDA at 1-800-FDA-1088. Where should I keep my medicine? The vaccine will be given by a health care professional in a clinic, pharmacy, doctor's office, or other health care setting. You will not be given vaccine doses to store at home. NOTE: This sheet is a summary. It may not cover all possible information. If you have questions about this medicine, talk to your doctor, pharmacist, or health care provider.  2018 Elsevier/Gold Standard (2011-01-05 14:06:47)

## 2016-11-04 ENCOUNTER — Ambulatory Visit: Payer: Medicare Other | Admitting: Internal Medicine

## 2016-12-05 ENCOUNTER — Ambulatory Visit (INDEPENDENT_AMBULATORY_CARE_PROVIDER_SITE_OTHER): Payer: Medicare Other | Admitting: Internal Medicine

## 2016-12-05 ENCOUNTER — Encounter: Payer: Self-pay | Admitting: Internal Medicine

## 2016-12-05 ENCOUNTER — Other Ambulatory Visit (INDEPENDENT_AMBULATORY_CARE_PROVIDER_SITE_OTHER): Payer: Medicare Other

## 2016-12-05 VITALS — BP 122/78 | HR 77 | Temp 97.7°F | Ht 64.0 in | Wt 107.0 lb

## 2016-12-05 DIAGNOSIS — I739 Peripheral vascular disease, unspecified: Secondary | ICD-10-CM

## 2016-12-05 DIAGNOSIS — Z0000 Encounter for general adult medical examination without abnormal findings: Secondary | ICD-10-CM | POA: Diagnosis not present

## 2016-12-05 DIAGNOSIS — E559 Vitamin D deficiency, unspecified: Secondary | ICD-10-CM

## 2016-12-05 DIAGNOSIS — M810 Age-related osteoporosis without current pathological fracture: Secondary | ICD-10-CM | POA: Diagnosis not present

## 2016-12-05 LAB — CBC WITH DIFFERENTIAL/PLATELET
Basophils Absolute: 0 10*3/uL (ref 0.0–0.1)
Basophils Relative: 0.3 % (ref 0.0–3.0)
Eosinophils Absolute: 0 10*3/uL (ref 0.0–0.7)
Eosinophils Relative: 0 % (ref 0.0–5.0)
HCT: 43.2 % (ref 36.0–46.0)
Hemoglobin: 14.3 g/dL (ref 12.0–15.0)
Lymphocytes Relative: 9.9 % — ABNORMAL LOW (ref 12.0–46.0)
Lymphs Abs: 0.8 10*3/uL (ref 0.7–4.0)
MCHC: 33 g/dL (ref 30.0–36.0)
MCV: 94 fl (ref 78.0–100.0)
Monocytes Absolute: 0.4 10*3/uL (ref 0.1–1.0)
Monocytes Relative: 5 % (ref 3.0–12.0)
Neutro Abs: 6.6 10*3/uL (ref 1.4–7.7)
Neutrophils Relative %: 84.8 % — ABNORMAL HIGH (ref 43.0–77.0)
Platelets: 159 10*3/uL (ref 150.0–400.0)
RBC: 4.59 Mil/uL (ref 3.87–5.11)
RDW: 13.3 % (ref 11.5–15.5)
WBC: 7.8 10*3/uL (ref 4.0–10.5)

## 2016-12-05 LAB — BASIC METABOLIC PANEL
BUN: 15 mg/dL (ref 6–23)
CO2: 27 mEq/L (ref 19–32)
Calcium: 9.5 mg/dL (ref 8.4–10.5)
Chloride: 101 mEq/L (ref 96–112)
Creatinine, Ser: 0.65 mg/dL (ref 0.40–1.20)
GFR: 93.68 mL/min (ref >60.00)
Glucose, Bld: 113 mg/dL — ABNORMAL HIGH (ref 70–99)
Potassium: 4.1 mEq/L (ref 3.5–5.1)
Sodium: 138 mEq/L (ref 135–145)

## 2016-12-05 LAB — LIPID PANEL
Cholesterol: 182 mg/dL (ref 0–200)
HDL: 63.5 mg/dL (ref >39.00)
LDL Cholesterol: 107 mg/dL — ABNORMAL HIGH (ref 0–99)
NonHDL: 118.12
Total CHOL/HDL Ratio: 3
Triglycerides: 56 mg/dL (ref 0.0–149.0)
VLDL: 11.2 mg/dL (ref 0.0–40.0)

## 2016-12-05 LAB — URINALYSIS
Bilirubin Urine: NEGATIVE
Ketones, ur: 15 — AB
Leukocytes, UA: NEGATIVE
Nitrite: NEGATIVE
Specific Gravity, Urine: >=1.03 — AB (ref 1.000–1.030)
Total Protein, Urine: NEGATIVE
Urine Glucose: NEGATIVE
Urobilinogen, UA: 0.2 (ref 0.0–1.0)
pH: 6 (ref 5.0–8.0)

## 2016-12-05 LAB — HEPATIC FUNCTION PANEL
ALT: 14 U/L (ref 0–35)
AST: 19 U/L (ref 0–37)
Albumin: 4.3 g/dL (ref 3.5–5.2)
Alkaline Phosphatase: 54 U/L (ref 39–117)
Bilirubin, Direct: 0.1 mg/dL (ref 0.0–0.3)
Total Bilirubin: 0.5 mg/dL (ref 0.2–1.2)
Total Protein: 7.3 g/dL (ref 6.0–8.3)

## 2016-12-05 LAB — TSH: TSH: 0.88 u[IU]/mL (ref 0.35–4.50)

## 2016-12-05 MED ORDER — VITAMIN D3 50 MCG (2000 UT) PO CAPS: 2000.0000 [IU] | ORAL_CAPSULE | Freq: Every day | ORAL | 3 refills | Status: AC

## 2016-12-05 NOTE — Assessment & Plan Note (Signed)
Doing well 

## 2016-12-05 NOTE — Progress Notes (Signed)
Subjective:  Patient ID: Tanya Walker, female    DOB: [birth year]  Age: 76 y.o. MRN: [id number]  CC: No chief complaint on file.   HPI Tanya Walker presents for a well exam. F/u tendonitis - better, Vit D def - took a Rx Vit D2 weekly and   Outpatient Medications Prior to Visit  Medication Sig Dispense Refill  . OVER THE COUNTER MEDICATION Solgar Calcium 600, Vit D 80IU, Mag 300 mg  Take 3 by mouth daily.    Marland Kitchen OVER THE COUNTER MEDICATION Nature's Bounty D3 2000 IU daily.    . Vitamin D, Ergocalciferol, (DRISDOL) 50000 units CAPS capsule Take 1 capsule (50,000 Units total) by mouth every 7 (seven) days. 12 capsule 0   No facility-administered medications prior to visit.     ROS Review of Systems  Constitutional: Negative for activity change, appetite change, chills, fatigue and unexpected weight change.  HENT: Negative for congestion, mouth sores and sinus pressure.   Eyes: Negative for visual disturbance.  Respiratory: Negative for cough and chest tightness.   Gastrointestinal: Negative for abdominal pain and nausea.  Genitourinary: Negative for difficulty urinating, frequency and vaginal pain.  Musculoskeletal: Negative for back pain and gait problem.  Skin: Negative for pallor and rash.  Neurological: Negative for dizziness, tremors, weakness, numbness and headaches.  Psychiatric/Behavioral: Negative for confusion and sleep disturbance.    Objective:  BP 122/78 (BP Location: Left Arm, Patient Position: Sitting, Cuff Size: Normal)   Pulse 77   Temp 97.7 F (36.5 C) (Oral)   Ht 5\' 4"  (1.626 m)   Wt 107 lb (48.5 kg)   SpO2 99%   BMI 18.37 kg/m   BP Readings from Last 3 Encounters:  12/05/16 122/78  09/29/16 128/64  08/11/16 114/78    Wt Readings from Last 3 Encounters:  12/05/16 107 lb (48.5 kg)  09/29/16 106 lb (48.1 kg)  08/11/16 107 lb (48.5 kg)    Physical Exam  Constitutional: She appears well-developed. No distress.  HENT:  Head: Normocephalic.    Right Ear: External ear normal.  Left Ear: External ear normal.  Nose: Nose normal.  Mouth/Throat: Oropharynx is clear and moist.  Eyes: Pupils are equal, round, and reactive to light. Conjunctivae are normal. Right eye exhibits no discharge. Left eye exhibits no discharge.  Neck: Normal range of motion. Neck supple. No JVD present. No tracheal deviation present. No thyromegaly present.  Cardiovascular: Normal rate, regular rhythm and normal heart sounds.   Pulmonary/Chest: No stridor. No respiratory distress. She has no wheezes.  Abdominal: Soft. Bowel sounds are normal. She exhibits no distension and no mass. There is no tenderness. There is no rebound and no guarding.  Musculoskeletal: She exhibits no edema or tenderness.  Lymphadenopathy:    She has no cervical adenopathy.  Neurological: She displays normal reflexes. No cranial nerve deficit. She exhibits normal muscle tone. Coordination normal.  Skin: No rash noted. No erythema.  Psychiatric: She has a normal mood and affect. Her behavior is normal. Judgment and thought content normal.    Lab Results  Component Value Date   WBC 5.9 10/01/2015   HGB 14.4 10/01/2015   HCT 43.0 10/01/2015   PLT 161.0 10/01/2015   GLUCOSE 84 10/01/2015   CHOL 186 10/01/2015   TRIG 59.0 10/01/2015   HDL 62.40 10/01/2015   LDLDIRECT 135.4 12/26/2008   LDLCALC 112 (H) 10/01/2015   ALT 11 10/01/2015   AST 16 10/01/2015   NA 142 10/01/2015   K 4.2  10/01/2015   CL 106 10/01/2015   CREATININE 0.73 10/01/2015   BUN 15 10/01/2015   CO2 30 10/01/2015   TSH 2.13 10/01/2015    Dg Ankle Complete Right  Result Date: 06/30/2016 CLINICAL DATA:  Twisting injury to the right ankle with swelling and pain EXAM: RIGHT ANKLE - COMPLETE 3+ VIEW COMPARISON:  None. FINDINGS: Vascular soft tissue calcifications. Possible cortical avulsion off the dorsal, anterior aspect of the talus. Moderate plantar calcaneal spur. Questionable small avulsion along the medial  aspect of the talus bone. Generalized soft tissue swelling. IMPRESSION: Possible small avulsion injury along the medial inferior aspect of talus, possibly from medial malleolus. Possible cortical avulsion off of the dorsal, anterior talus. Moderate plantar calcaneal spur. Electronically Signed   By: Donavan Foil M.D.   On: 06/30/2016 14:12    Assessment & Plan:   There are no diagnoses linked to this encounter. I am having Tanya Walker maintain her [place], OVER THE COUNTER MEDICATION, and Vitamin D (Ergocalciferol).  No orders of the defined types were placed in this encounter.    Follow-up: No Follow-up on file.  Walker Kehr, MD

## 2016-12-05 NOTE — Assessment & Plan Note (Signed)
On Vit D 

## 2016-12-05 NOTE — Assessment & Plan Note (Signed)
Vit D, Ca Start exercising

## 2016-12-05 NOTE — Assessment & Plan Note (Addendum)
Here for medicare wellness/physical  Diet: heart healthy  Physical activity: not sedentary  Depression/mood screen: negative  Hearing: intact to whispered voice  Visual acuity: grossly normal w/glasses, performs annual eye exam  ADLs: capable  Fall risk: low to none  Home safety: good  Cognitive evaluation: intact to orientation, naming, recall and repetition  EOL planning: adv directives, full code/ I agree  I have personally reviewed and have noted  1. The patient's medical, surgical and social history  2. Their use of alcohol, tobacco or illicit drugs  3. Their current medications and supplements  4. The patient's functional ability including ADL's, fall risks, home safety risks and hearing or visual impairment.  5. Diet and physical activities  6. Evidence for depression or mood disorders 7. The roster of all physicians providing medical care to patient - is listed in the Snapshot section of the chart and reviewed today.    Today patient counseled on age appropriate routine health concerns for screening and prevention, each reviewed and up to date or declined. Immunizations reviewed and up to date or declined. Labs ordered and reviewed. Risk factors for depression reviewed and negative. Hearing function and visual acuity are intact. ADLs screened and addressed as needed. Functional ability and level of safety reviewed and appropriate. Education, counseling and referrals performed based on assessed risks today. Patient provided with a copy of personalized plan for preventive services.   GYN - Dr Ronita Hipps Mammo q 12 mo Colon up to date -- Dr Ivin Booty in [place] shingrix discussed

## 2016-12-22 ENCOUNTER — Telehealth: Payer: Self-pay | Admitting: *Deleted

## 2016-12-22 MED ORDER — ZOSTER VAC RECOMB ADJUVANTED 50 MCG/0.5ML IM SUSR: 0.5000 mL | Freq: Once | INTRAMUSCULAR | 1 refills | Status: AC

## 2016-12-22 NOTE — Telephone Encounter (Signed)
Received a verbal order to send Shingrix vaccine prescription to patient's pharmacy.

## 2017-02-14 DIAGNOSIS — Z124 Encounter for screening for malignant neoplasm of cervix: Secondary | ICD-10-CM | POA: Diagnosis not present

## 2017-02-22 DIAGNOSIS — I8312 Varicose veins of left lower extremity with inflammation: Secondary | ICD-10-CM | POA: Diagnosis not present

## 2017-02-22 DIAGNOSIS — M79605 Pain in left leg: Secondary | ICD-10-CM | POA: Diagnosis not present

## 2017-02-28 DIAGNOSIS — I8312 Varicose veins of left lower extremity with inflammation: Secondary | ICD-10-CM | POA: Diagnosis not present

## 2017-03-09 DIAGNOSIS — L821 Other seborrheic keratosis: Secondary | ICD-10-CM | POA: Diagnosis not present

## 2017-03-09 DIAGNOSIS — Z23 Encounter for immunization: Secondary | ICD-10-CM | POA: Diagnosis not present

## 2017-03-09 DIAGNOSIS — D225 Melanocytic nevi of trunk: Secondary | ICD-10-CM | POA: Diagnosis not present

## 2017-03-09 DIAGNOSIS — Z85828 Personal history of other malignant neoplasm of skin: Secondary | ICD-10-CM | POA: Diagnosis not present

## 2017-03-09 DIAGNOSIS — D2271 Melanocytic nevi of right lower limb, including hip: Secondary | ICD-10-CM | POA: Diagnosis not present

## 2017-03-09 DIAGNOSIS — Z86018 Personal history of other benign neoplasm: Secondary | ICD-10-CM | POA: Diagnosis not present

## 2017-03-22 DIAGNOSIS — I8312 Varicose veins of left lower extremity with inflammation: Secondary | ICD-10-CM | POA: Diagnosis not present

## 2017-03-28 DIAGNOSIS — L820 Inflamed seborrheic keratosis: Secondary | ICD-10-CM | POA: Diagnosis not present

## 2017-04-28 DIAGNOSIS — I8312 Varicose veins of left lower extremity with inflammation: Secondary | ICD-10-CM | POA: Diagnosis not present

## 2017-06-27 DIAGNOSIS — M7981 Nontraumatic hematoma of soft tissue: Secondary | ICD-10-CM | POA: Diagnosis not present

## 2017-08-11 DIAGNOSIS — Z1231 Encounter for screening mammogram for malignant neoplasm of breast: Secondary | ICD-10-CM | POA: Diagnosis not present

## 2017-08-11 LAB — HM MAMMOGRAPHY

## 2017-08-17 ENCOUNTER — Encounter: Payer: Self-pay | Admitting: Internal Medicine

## 2017-09-29 NOTE — Progress Notes (Addendum)
Subjective:   Tanya Walker is a 77 y.o. female who presents for Medicare Annual (Subsequent) preventive examination.  Review of Systems:  No ROS.  Medicare Wellness Visit. Additional risk factors are reflected in the social history.  Cardiac Risk Factors include: advanced age (>79men, >30 women) Sleep patterns: gets up 1 times nightly to void and sleeps 7 hours nightly.    Home Safety/Smoke Alarms: Feels safe in home. Smoke alarms in place.  Living environment; residence and Firearm Safety: apartment, no firearms. Resides at [facility]. Lives with husband, no needs for DME, good support systemSeat Belt Safety/Bike Helmet: Wears seat belt.     Objective:     Vitals: BP 121/64   Pulse 77   Resp 18   Ht 5\' 4"  (1.626 m)   Wt 108 lb (49 kg)   SpO2 98%   BMI 18.54 kg/m   Body mass index is 18.54 kg/m.  Advanced Directives 10/02/2017 09/29/2016 07/12/2013  Does Patient Have a Medical Advance Directive? Yes Yes Patient would not like information  Type of Advance Directive [place];Living will [place];Living will -  Copy of [place] in Chart? No - copy requested No - copy requested -    Tobacco Social History   Tobacco Use  Smoking Status Never Smoker  Smokeless Tobacco Never Used     Counseling given: Not Answered  Past Medical History:  Diagnosis Date  . Anxiety    no meds  . Cancer ([place])    skin  . Lipoma of arm    Right  . Medical history non-contributory   . Osteoporosis   . SVD (spontaneous vaginal delivery)    x 2  . Varicose vein   . Vitamin D deficiency    Past Surgical History:  Procedure Laterality Date  . APPENDECTOMY  1947  . CHOLECYSTECTOMY  1988  . GANGLION CYST EXCISION     x2 left and right arm  . HYSTEROSCOPY W/D&C N/A 07/15/2013   Procedure: DILATATION AND CURETTAGE /HYSTEROSCOPY;  Surgeon: Lovenia Kim, MD;  Location: [facility];  Service: Gynecology;  Laterality: N/A;  . [place]  . WISDOM TOOTH EXTRACTION     History reviewed. No pertinent family history. Social History   Socioeconomic History  . Marital status: Married    Spouse name: Not on file  . Number of children: Not on file  . Years of education: Not on file  . Highest education level: Not on file  Occupational History  . Not on file  Social Needs  . Financial resource strain: Not hard at all  . Food insecurity:    Worry: Never true    Inability: Never true  . Transportation needs:    Medical: No    Non-medical: No  Tobacco Use  . Smoking status: Never Smoker  . Smokeless tobacco: Never Used  Substance and Sexual Activity  . Alcohol use: No  . Drug use: No  . Sexual activity: Yes    Birth control/protection: Post-menopausal  Lifestyle  . Physical activity:    Days per week: 3 days    Minutes per session: 40 min  . Stress: Only a little  Relationships  . Social connections:    Talks on phone: More than three times a week    Gets together: More than three times a week    Attends religious service: 1 to 4 times per year    Active member of club  or organization: Yes    Attends meetings of clubs or organizations: More than 4 times per year    Relationship status: Married  Other Topics Concern  . Not on file  Social History Narrative   GI in [place] for colonoscopy   Dr Ronita Hipps - GYN      Denies Surgical history      Family history of varicose veins   Mother is 20      Regular exercise - NO    Outpatient Encounter Medications as of 10/02/2017  Medication Sig  . Cholecalciferol (VITAMIN D3) 2000 units capsule Take 1 capsule (2,000 Units total) by mouth daily.  . Multiple Vitamins-Minerals (PRESERVISION AREDS PO) Take 1 tablet by mouth.  Marland Kitchen OVER THE COUNTER MEDICATION Solgar Calcium 600, Vit D 80IU, Mag 300 mg  Take 3 by mouth daily.  Marland Kitchen OVER THE COUNTER MEDICATION Nature's Bounty D3 2000 IU daily.  Marland Kitchen Zoster Vaccine Adjuvanted [facility]) injection Inject 0.5 mLs into the  muscle once for 1 dose.   No facility-administered encounter medications on file as of 10/02/2017.     Activities of Daily Living In your present state of health, do you have any difficulty performing the following activities: 10/02/2017  Hearing? N  Vision? N  Difficulty concentrating or making decisions? N  Walking or climbing stairs? N  Dressing or bathing? N  Doing errands, shopping? N  Preparing Food and eating ? N  Using the Toilet? N  In the past six months, have you accidently leaked urine? N  Do you have problems with loss of bowel control? N  Managing your Medications? N  Managing your Finances? N  Housekeeping or managing your Housekeeping? N  Some recent data might be hidden    Patient Care Team: Plotnikov, [name], MD as PCP - General    Assessment:   This is a routine wellness examination for Tanya Walker. Physical assessment deferred to PCP.   Exercise Activities and Dietary recommendations Current Exercise Habits: Home exercise routine, Type of exercise: walking;stretching, Time (Minutes): 40, Frequency (Times/Week): 3, Weekly Exercise (Minutes/Week): 120, Intensity: Mild, Exercise limited by: None identified  Diet (meal preparation, eat out, water intake, caffeinated beverages, dairy products, fruits and vegetables): in general, a "healthy" diet  , well balanced   Encouraged patient to increase daily water and healthy fluid intake.  Goals    . Increase exercise     Called the gym facility at [facility] to be shown proper technique and how to use equipment. Start to work out 2 times weekly.     . Patient Stated     Stay as healthy and as independent as possible. Start to use some light weights during my workouts.         Fall Risk Fall Risk  10/02/2017 09/29/2016 10/08/2015 07/17/2014  Falls in the past year? No No No No  Comment - - Emmi Telephone Survey: data to providers prior to load -    Depression Screen PHQ 2/9 Scores 10/02/2017 09/29/2016 07/17/2014    PHQ - 2 Score 1 0 0  PHQ- 9 Score 2 1 -     Cognitive Function MMSE - Mini Mental State Exam 10/02/2017  Not completed: Refused       Ad8 score reviewed for issues:  Issues making decisions: no  Less interest in hobbies / activities: no  Repeats questions, stories (family complaining): no  Trouble using ordinary gadgets (microwave, computer, phone):no  Forgets the month or year: no  Mismanaging finances: no  Remembering appts: no  Daily problems with thinking and/or memory: no Ad8 score is= 0  Immunization History  Administered Date(s) Administered  . DT 04/10/2002  . Influenza Split 03/08/2011, 11/17/2011  . Influenza Whole 01/09/2007, 11/05/2008, 11/04/2009  . Influenza, High Dose Seasonal PF 12/07/2015, 09/29/2016  . Influenza-Unspecified 10/30/2012, 11/29/2013  . PPD Test 12/22/2015, 01/05/2016  . Pneumococcal Conjugate-13 07/17/2014  . Pneumococcal Polysaccharide-23 08/15/2005, 09/25/2015  . Tetanus 12/24/2012  . Zoster 12/05/2013    Screening Tests Health Maintenance  Topic Date Due  . INFLUENZA VACCINE  09/07/2017  . TETANUS/TDAP  12/25/2022  . DEXA SCAN  Completed  . PNA vac Low Risk Adult  Completed      Plan:      Continue doing brain stimulating activities (puzzles, reading, adult coloring books, staying active) to keep memory sharp.   Continue to eat heart healthy diet (full of fruits, vegetables, whole grains, lean protein, water--limit salt, fat, and sugar intake) and increase physical activity as tolerated.  I have personally reviewed and noted the following in the patient's chart:   . Medical and social history . Use of alcohol, tobacco or illicit drugs  . Current medications and supplements . Functional ability and status . Nutritional status . Physical activity . Advanced directives . List of other physicians . Vitals . Screenings to include cognitive, depression, and falls . Referrals and appointments  In addition, I have  reviewed and discussed with patient certain preventive protocols, quality metrics, and best practice recommendations. A written personalized care plan for preventive services as well as general preventive health recommendations were provided to patient.     Michiel Cowboy, RN  10/02/2017   Medical screening examination/treatment/procedure(s) were performed by non-physician practitioner and as supervising physician I was immediately available for consultation/collaboration. I agree with above. Lew Dawes, MD

## 2017-10-02 ENCOUNTER — Ambulatory Visit (INDEPENDENT_AMBULATORY_CARE_PROVIDER_SITE_OTHER): Payer: Medicare Other | Admitting: *Deleted

## 2017-10-02 VITALS — BP 121/64 | HR 77 | Resp 18 | Ht 64.0 in | Wt 108.0 lb

## 2017-10-02 DIAGNOSIS — Z0000 Encounter for general adult medical examination without abnormal findings: Secondary | ICD-10-CM

## 2017-10-02 MED ORDER — ZOSTER VAC RECOMB ADJUVANTED 50 MCG/0.5ML IM SUSR: 0.5000 mL | Freq: Once | INTRAMUSCULAR | 1 refills | Status: AC

## 2017-10-02 NOTE — Patient Instructions (Addendum)
Continue doing brain stimulating activities (puzzles, reading, adult coloring books, staying active) to keep memory sharp.   Continue to eat heart healthy diet (full of fruits, vegetables, whole grains, lean protein, water--limit salt, fat, and sugar intake) and increase physical activity as tolerated.   Tanya Walker , Thank you for taking time to come for your Medicare Wellness Visit. I appreciate your ongoing commitment to your health goals. Please review the following plan we discussed and let me know if I can assist you in the future.   These are the goals we discussed: Goals    . Increase exercise     Called the gym facility at [facility] to be shown proper technique and how to use equipment. Start to work out 2 times weekly.     . Patient Stated     Stay as healthy and as independent as possible. Start to use some light weights during my workouts.         This is a list of the screening recommended for you and due dates:  Health Maintenance  Topic Date Due  . Flu Shot  09/07/2017  . Tetanus Vaccine  12/25/2022  . DEXA scan (bone density measurement)  Completed  . Pneumonia vaccines  Completed   Health Maintenance, Female Adopting a healthy lifestyle and getting preventive care can go a long way to promote health and wellness. Talk with your health care provider about what schedule of regular examinations is right for you. This is a good chance for you to check in with your provider about disease prevention and staying healthy. In between checkups, there are plenty of things you can do on your own. Experts have done a lot of research about which lifestyle changes and preventive measures are most likely to keep you healthy. Ask your health care provider for more information. Weight and diet Eat a healthy diet  Be sure to include plenty of vegetables, fruits, low-fat dairy products, and lean protein.  Do not eat a lot of foods high in solid fats, added sugars, or salt.  Get  regular exercise. This is one of the most important things you can do for your health. ? Most adults should exercise for at least 150 minutes each week. The exercise should increase your heart rate and make you sweat (moderate-intensity exercise). ? Most adults should also do strengthening exercises at least twice a week. This is in addition to the moderate-intensity exercise.  Maintain a healthy weight  Body mass index (BMI) is a measurement that can be used to identify possible weight problems. It estimates body fat based on height and weight. Your health care provider can help determine your BMI and help you achieve or maintain a healthy weight.  For females 33 years of age and older: ? A BMI below 18.5 is considered underweight. ? A BMI of 18.5 to 24.9 is normal. ? A BMI of 25 to 29.9 is considered overweight. ? A BMI of 30 and above is considered obese.  Watch levels of cholesterol and blood lipids  You should start having your blood tested for lipids and cholesterol at 79 years of age, then have this test every 5 years.  You may need to have your cholesterol levels checked more often if: ? Your lipid or cholesterol levels are high. ? You are older than 79 years of age. ? You are at high risk for heart disease.  Cancer screening Lung Cancer  Lung cancer screening is recommended for adults 55-80 years  old who are at high risk for lung cancer because of a history of smoking.  A yearly low-dose CT scan of the lungs is recommended for people who: ? Currently smoke. ? Have quit within the past 15 years. ? Have at least a 30-pack-year history of smoking. A pack year is smoking an average of one pack of cigarettes a day for 1 year.  Yearly screening should continue until it has been 15 years since you quit.  Yearly screening should stop if you develop a health problem that would prevent you from having lung cancer treatment.  Breast Cancer  Practice breast self-awareness. This  means understanding how your breasts normally appear and feel.  It also means doing regular breast self-exams. Let your health care provider know about any changes, no matter how small.  If you are in your 20s or 30s, you should have a clinical breast exam (CBE) by a health care provider every 1-3 years as part of a regular health exam.  If you are 4 or older, have a CBE every year. Also consider having a breast X-ray (mammogram) every year.  If you have a family history of breast cancer, talk to your health care provider about genetic screening.  If you are at high risk for breast cancer, talk to your health care provider about having an MRI and a mammogram every year.  Breast cancer gene (BRCA) assessment is recommended for women who have family members with BRCA-related cancers. BRCA-related cancers include: ? Breast. ? Ovarian. ? Tubal. ? Peritoneal cancers.  Results of the assessment will determine the need for genetic counseling and BRCA1 and BRCA2 testing.  Cervical Cancer Your health care provider may recommend that you be screened regularly for cancer of the pelvic organs (ovaries, uterus, and vagina). This screening involves a pelvic examination, including checking for microscopic changes to the surface of your cervix (Pap test). You may be encouraged to have this screening done every 3 years, beginning at age 66.  For women ages 67-65, health care providers may recommend pelvic exams and Pap testing every 3 years, or they may recommend the Pap and pelvic exam, combined with testing for human papilloma virus (HPV), every 5 years. Some types of HPV increase your risk of cervical cancer. Testing for HPV may also be done on women of any age with unclear Pap test results.  Other health care providers may not recommend any screening for nonpregnant women who are considered low risk for pelvic cancer and who do not have symptoms. Ask your health care provider if a screening pelvic exam  is right for you.  If you have had past treatment for cervical cancer or a condition that could lead to cancer, you need Pap tests and screening for cancer for at least 20 years after your treatment. If Pap tests have been discontinued, your risk factors (such as having a new sexual partner) need to be reassessed to determine if screening should resume. Some women have medical problems that increase the chance of getting cervical cancer. In these cases, your health care provider may recommend more frequent screening and Pap tests.  Colorectal Cancer  This type of cancer can be detected and often prevented.  Routine colorectal cancer screening usually begins at 79 years of age and continues through 79 years of age.  Your health care provider may recommend screening at an earlier age if you have risk factors for colon cancer.  Your health care provider may also recommend using home test  kits to check for hidden blood in the stool.  A small camera at the end of a tube can be used to examine your colon directly (sigmoidoscopy or colonoscopy). This is done to check for the earliest forms of colorectal cancer.  Routine screening usually begins at age 14.  Direct examination of the colon should be repeated every 5-10 years through 78 years of age. However, you may need to be screened more often if early forms of precancerous polyps or small growths are found.  Skin Cancer  Check your skin from head to toe regularly.  Tell your health care provider about any new moles or changes in moles, especially if there is a change in a mole's shape or color.  Also tell your health care provider if you have a mole that is larger than the size of a pencil eraser.  Always use sunscreen. Apply sunscreen liberally and repeatedly throughout the day.  Protect yourself by wearing long sleeves, pants, a wide-brimmed hat, and sunglasses whenever you are outside.  Heart disease, diabetes, and high blood  pressure  High blood pressure causes heart disease and increases the risk of stroke. High blood pressure is more likely to develop in: ? People who have blood pressure in the high end of the normal range (130-139/85-89 mm Hg). ? People who are overweight or obese. ? People who are African American.  If you are 67-88 years of age, have your blood pressure checked every 3-5 years. If you are 25 years of age or older, have your blood pressure checked every year. You should have your blood pressure measured twice-once when you are at a hospital or clinic, and once when you are not at a hospital or clinic. Record the average of the two measurements. To check your blood pressure when you are not at a hospital or clinic, you can use: ? An automated blood pressure machine at a pharmacy. ? A home blood pressure monitor.  If you are between 65 years and 22 years old, ask your health care provider if you should take aspirin to prevent strokes.  Have regular diabetes screenings. This involves taking a blood sample to check your fasting blood sugar level. ? If you are at a normal weight and have a low risk for diabetes, have this test once every three years after 79 years of age. ? If you are overweight and have a high risk for diabetes, consider being tested at a younger age or more often. Preventing infection Hepatitis B  If you have a higher risk for hepatitis B, you should be screened for this virus. You are considered at high risk for hepatitis B if: ? You were born in a country where hepatitis B is common. Ask your health care provider which countries are considered high risk. ? Your parents were born in a high-risk country, and you have not been immunized against hepatitis B (hepatitis B vaccine). ? You have HIV or AIDS. ? You use needles to inject street drugs. ? You live with someone who has hepatitis B. ? You have had sex with someone who has hepatitis B. ? You get hemodialysis  treatment. ? You take certain medicines for conditions, including cancer, organ transplantation, and autoimmune conditions.  Hepatitis C  Blood testing is recommended for: ? Everyone born from [birth year] through 1967. ? Anyone with known risk factors for hepatitis C.  Sexually transmitted infections (STIs)  You should be screened for sexually transmitted infections (STIs) including gonorrhea and chlamydia if: ?  You are sexually active and are younger than 79 years of age. ? You are older than 79 years of age and your health care provider tells you that you are at risk for this type of infection. ? Your sexual activity has changed since you were last screened and you are at an increased risk for chlamydia or gonorrhea. Ask your health care provider if you are at risk.  If you do not have HIV, but are at risk, it may be recommended that you take a prescription medicine daily to prevent HIV infection. This is called pre-exposure prophylaxis (PrEP). You are considered at risk if: ? You are sexually active and do not regularly use condoms or know the HIV status of your partner(s). ? You take drugs by injection. ? You are sexually active with a partner who has HIV.  Talk with your health care provider about whether you are at high risk of being infected with HIV. If you choose to begin PrEP, you should first be tested for HIV. You should then be tested every 3 months for as long as you are taking PrEP. Pregnancy  If you are premenopausal and you may become pregnant, ask your health care provider about preconception counseling.  If you may become pregnant, take 400 to 800 micrograms (mcg) of folic acid every day.  If you want to prevent pregnancy, talk to your health care provider about birth control (contraception). Osteoporosis and menopause  Osteoporosis is a disease in which the bones lose minerals and strength with aging. This can result in serious bone fractures. Your risk for osteoporosis  can be identified using a bone density scan.  If you are 68 years of age or older, or if you are at risk for osteoporosis and fractures, ask your health care provider if you should be screened.  Ask your health care provider whether you should take a calcium or vitamin D supplement to lower your risk for osteoporosis.  Menopause may have certain physical symptoms and risks.  Hormone replacement therapy may reduce some of these symptoms and risks. Talk to your health care provider about whether hormone replacement therapy is right for you. Follow these instructions at home:  Schedule regular health, dental, and eye exams.  Stay current with your immunizations.  Do not use any tobacco products including cigarettes, chewing tobacco, or electronic cigarettes.  If you are pregnant, do not drink alcohol.  If you are breastfeeding, limit how much and how often you drink alcohol.  Limit alcohol intake to no more than 1 drink per day for nonpregnant women. One drink equals 12 ounces of beer, 5 ounces of Harlyn Rathmann, or 1 ounces of hard liquor.  Do not use street drugs.  Do not share needles.  Ask your health care provider for help if you need support or information about quitting drugs.  Tell your health care provider if you often feel depressed.  Tell your health care provider if you have ever been abused or do not feel safe at home. This information is not intended to replace advice given to you by your health care provider. Make sure you discuss any questions you have with your health care provider. Document Released: 08/09/2010 Document Revised: 07/02/2015 Document Reviewed: 10/28/2014 Elsevier Interactive Patient Education  [employer].

## 2017-10-23 DIAGNOSIS — H5203 Hypermetropia, bilateral: Secondary | ICD-10-CM | POA: Diagnosis not present

## 2017-10-23 DIAGNOSIS — H353132 Nonexudative age-related macular degeneration, bilateral, intermediate dry stage: Secondary | ICD-10-CM | POA: Diagnosis not present

## 2017-10-23 DIAGNOSIS — H2513 Age-related nuclear cataract, bilateral: Secondary | ICD-10-CM | POA: Diagnosis not present

## 2017-10-25 DIAGNOSIS — L570 Actinic keratosis: Secondary | ICD-10-CM | POA: Diagnosis not present

## 2017-10-25 DIAGNOSIS — L719 Rosacea, unspecified: Secondary | ICD-10-CM | POA: Diagnosis not present

## 2017-11-01 DIAGNOSIS — Z86010 Personal history of colonic polyps: Secondary | ICD-10-CM | POA: Diagnosis not present

## 2017-11-01 DIAGNOSIS — D120 Benign neoplasm of cecum: Secondary | ICD-10-CM | POA: Diagnosis not present

## 2017-11-01 DIAGNOSIS — K5730 Diverticulosis of large intestine without perforation or abscess without bleeding: Secondary | ICD-10-CM | POA: Diagnosis not present

## 2017-11-01 DIAGNOSIS — K648 Other hemorrhoids: Secondary | ICD-10-CM | POA: Diagnosis not present

## 2017-11-01 DIAGNOSIS — D126 Benign neoplasm of colon, unspecified: Secondary | ICD-10-CM | POA: Diagnosis not present

## 2017-11-01 DIAGNOSIS — Z1211 Encounter for screening for malignant neoplasm of colon: Secondary | ICD-10-CM | POA: Diagnosis not present

## 2017-11-01 LAB — HM COLONOSCOPY

## 2017-11-09 ENCOUNTER — Ambulatory Visit (INDEPENDENT_AMBULATORY_CARE_PROVIDER_SITE_OTHER): Payer: Medicare Other

## 2017-11-09 DIAGNOSIS — Z23 Encounter for immunization: Secondary | ICD-10-CM | POA: Diagnosis not present

## 2017-11-28 DIAGNOSIS — L719 Rosacea, unspecified: Secondary | ICD-10-CM | POA: Diagnosis not present

## 2017-11-28 DIAGNOSIS — L309 Dermatitis, unspecified: Secondary | ICD-10-CM | POA: Diagnosis not present

## 2018-03-29 ENCOUNTER — Ambulatory Visit (INDEPENDENT_AMBULATORY_CARE_PROVIDER_SITE_OTHER): Payer: Medicare Other | Admitting: Family Medicine

## 2018-03-29 ENCOUNTER — Encounter: Payer: Self-pay | Admitting: Family Medicine

## 2018-03-29 VITALS — BP 108/78 | HR 65 | Temp 97.8°F | Ht 64.0 in | Wt 105.1 lb

## 2018-03-29 DIAGNOSIS — K5900 Constipation, unspecified: Secondary | ICD-10-CM | POA: Diagnosis not present

## 2018-03-29 NOTE — Patient Instructions (Addendum)
Increase water intake, start Miralax today, and increase fiber intake. If symptoms do not improve, worsen, or new symptoms develop, follow up for further evaluation.   Constipation, Adult Constipation is when a person:  Poops (has a bowel movement) fewer times in a week than normal.  Has a hard time pooping.  Has poop that is dry, hard, or bigger than normal. Follow these instructions at home: Eating and drinking   Eat foods that have a lot of fiber, such as: ? Fresh fruits and vegetables. ? Whole grains. ? Beans.  Eat less of foods that are high in fat, low in fiber, or overly processed, such as: ? [place] fries. ? Hamburgers. ? Cookies. ? Candy. ? Soda.  Drink enough fluid to keep your pee (urine) clear or pale yellow. General instructions  Exercise regularly or as told by your doctor.  Go to the restroom when you feel like you need to poop. Do not hold it in.  Take over-the-counter and prescription medicines only as told by your doctor. These include any fiber supplements.  Do pelvic floor retraining exercises, such as: ? Doing deep breathing while relaxing your lower belly (abdomen). ? Relaxing your pelvic floor while pooping.  Watch your condition for any changes.  Keep all follow-up visits as told by your doctor. This is important. Contact a doctor if:  You have pain that gets worse.  You have a fever.  You have not pooped for 4 days.  You throw up (vomit).  You are not hungry.  You lose weight.  You are bleeding from the anus.  You have thin, pencil-like poop (stool). Get help right away if:  You have a fever, and your symptoms suddenly get worse.  You leak poop or have blood in your poop.  Your belly feels hard or bigger than normal (is bloated).  You have very bad belly pain.  You feel dizzy or you faint. This information is not intended to replace advice given to you by your health care provider. Make sure you discuss any questions you  have with your health care provider. Document Released: 07/13/2007 Document Revised: 08/14/2015 Document Reviewed: 07/15/2015 Elsevier Interactive Patient Education  2019 [employer].

## 2018-03-29 NOTE — Progress Notes (Signed)
Subjective:    Patient ID: Tanya Walker, female    DOB: 1940-07-03, 78 y.o.   MRN: [id number]  HPI  [name] is a 78 year old female who presents today constipation that started about one week ago. She has been having "small BMs" that have been  Treatment with one day of Miralax did not provided benefit. She has tried colace sporadically that provided some benefit. She describes BM this morning as softer today. Abdominal Pain: No Loss of appetite: No Vomiting: No Diarrhea: No Rectal bleeding: No Fever: No Weight loss:No Hematochezia: No Melena: No Thin caliber stools:No Family History of colon cancer or IBS: No Stress/Depression: Recent increase in stress has been present. She reports that she does not living where she lives now due to poor food and history of bed bugs that were treated.  Last colonoscopy completed 11/02/2018. She states that she had a polyp removed that was benign with follow up screening in 5 years.     Review of Systems  Constitutional: Negative for chills, fatigue and fever.  Respiratory: Negative for cough, shortness of breath and wheezing.   Cardiovascular: Negative for chest pain and palpitations.  Gastrointestinal: Positive for constipation. Negative for abdominal distention, abdominal pain, blood in stool, diarrhea, nausea and vomiting.  Genitourinary: Negative for dysuria.  Skin: Negative for rash.  Psychiatric/Behavioral:       Increased stress, denies depressed mood   Past Medical History:  Diagnosis Date  . Anxiety    no meds  . Cancer ([place])    skin  . Lipoma of arm    Right  . Medical history non-contributory   . Osteoporosis   . SVD (spontaneous vaginal delivery)    x 2  . Varicose vein   . Vitamin D deficiency      Social History   Socioeconomic History  . Marital status: Married    Spouse name: Not on file  . Number of children: Not on file  . Years of education: Not on file  . Highest education level: Not on file    Occupational History  . Not on file  Social Needs  . Financial resource strain: Not hard at all  . Food insecurity:    Worry: Never true    Inability: Never true  . Transportation needs:    Medical: No    Non-medical: No  Tobacco Use  . Smoking status: Never Smoker  . Smokeless tobacco: Never Used  Substance and Sexual Activity  . Alcohol use: No  . Drug use: No  . Sexual activity: Yes    Birth control/protection: Post-menopausal  Lifestyle  . Physical activity:    Days per week: 3 days    Minutes per session: 40 min  . Stress: Only a little  Relationships  . Social connections:    Talks on phone: More than three times a week    Gets together: More than three times a week    Attends religious service: 1 to 4 times per year    Active member of club or organization: Yes    Attends meetings of clubs or organizations: More than 4 times per year    Relationship status: Married  . Intimate partner violence:    Fear of current or ex partner: No    Emotionally abused: No    Physically abused: No    Forced sexual activity: No  Other Topics Concern  . Not on file  Social History Narrative   GI in [place] for  colonoscopy   Dr Ronita Hipps - GYN      Denies Surgical history      Family history of varicose veins   Mother is 36      Regular exercise - NO    Past Surgical History:  Procedure Laterality Date  . APPENDECTOMY  1947  . CHOLECYSTECTOMY  1988  . GANGLION CYST EXCISION     x2 left and right arm  . HYSTEROSCOPY W/D&C N/A 07/15/2013   Procedure: DILATATION AND CURETTAGE /HYSTEROSCOPY;  Surgeon: Lovenia Kim, MD;  Location: [facility];  Service: Gynecology;  Laterality: N/A;  . [place]  . WISDOM TOOTH EXTRACTION      History reviewed. No pertinent family history.  Allergies  Allergen Reactions  . Keflex [Cephalexin]   . Alendronate Sodium Other (See Comments)    Leg cramps  . Risedronate Sodium     REACTION: ?? achy    Current Outpatient  Medications on File Prior to Visit  Medication Sig Dispense Refill  . Cholecalciferol (VITAMIN D3) 2000 units capsule Take 1 capsule (2,000 Units total) by mouth daily. 100 capsule 3  . metroNIDAZOLE (METROCREAM) 0.75 % cream APP EXT AA BID  3  . Multiple Vitamins-Minerals (PRESERVISION AREDS PO) Take 1 tablet by mouth.    Marland Kitchen OVER THE COUNTER MEDICATION Solgar Calcium 600, Vit D 80IU, Mag 300 mg  Take 3 by mouth daily.    Marland Kitchen OVER THE COUNTER MEDICATION Nature's Bounty D3 2000 IU daily.     No current facility-administered medications on file prior to visit.     BP 108/78 (BP Location: Left Arm, Patient Position: Sitting, Cuff Size: Normal)   Pulse 65   Temp 97.8 F (36.6 C) (Oral)   Ht 5\' 4"  (1.626 m)   Wt 105 lb 1.3 oz (47.7 kg)   SpO2 97%   BMI 18.04 kg/m       Objective:   Physical Exam Constitutional:      Appearance: Normal appearance.  HENT:     Mouth/Throat:     Mouth: Mucous membranes are moist.     Pharynx: Oropharynx is clear.  Eyes:     General: No scleral icterus.    Pupils: Pupils are equal, round, and reactive to light.  Neck:     Musculoskeletal: Neck supple.  Cardiovascular:     Rate and Rhythm: Normal rate and regular rhythm.     Pulses: Normal pulses.     Heart sounds: Normal heart sounds.  Pulmonary:     Effort: Pulmonary effort is normal.     Breath sounds: Normal breath sounds.  Abdominal:     General: Abdomen is flat. Bowel sounds are normal. There is no distension.     Palpations: Abdomen is soft.     Tenderness: There is no abdominal tenderness. There is no right CVA tenderness, left CVA tenderness, guarding or rebound.  Skin:    General: Skin is warm and dry.     Capillary Refill: Capillary refill takes less than 2 seconds.  Neurological:     Mental Status: She is alert.  Psychiatric:        Mood and Affect: Mood normal.        Behavior: Behavior normal.        Thought Content: Thought content normal.        Judgment: Judgment normal.         Assessment & Plan:  1. Constipation, unspecified constipation type BMs are occurring, smaller in volume, unremarkable  abdominal exam. Symptoms are most likely due to increased stress, lack of fiber and water in diet. Advised increasing water intake, Miralax daily, and she will address source of stress where she is living at this time as she and her husband are considering a move.   She will have colonoscopy results sent to this office.  She will follow up if no improvement.  Delano Metz, FNP-C

## 2018-04-05 ENCOUNTER — Encounter: Payer: Self-pay | Admitting: Internal Medicine

## 2018-04-05 NOTE — Progress Notes (Signed)
Outside notes received. Information abstracted. Notes sent to scan.  

## 2018-04-16 DIAGNOSIS — Z01419 Encounter for gynecological examination (general) (routine) without abnormal findings: Secondary | ICD-10-CM | POA: Diagnosis not present

## 2018-04-16 DIAGNOSIS — Z124 Encounter for screening for malignant neoplasm of cervix: Secondary | ICD-10-CM | POA: Diagnosis not present

## 2018-06-28 DIAGNOSIS — L821 Other seborrheic keratosis: Secondary | ICD-10-CM | POA: Diagnosis not present

## 2018-06-28 DIAGNOSIS — D225 Melanocytic nevi of trunk: Secondary | ICD-10-CM | POA: Diagnosis not present

## 2018-06-28 DIAGNOSIS — D2271 Melanocytic nevi of right lower limb, including hip: Secondary | ICD-10-CM | POA: Diagnosis not present

## 2018-06-28 DIAGNOSIS — Z85828 Personal history of other malignant neoplasm of skin: Secondary | ICD-10-CM | POA: Diagnosis not present

## 2018-06-28 DIAGNOSIS — Z86018 Personal history of other benign neoplasm: Secondary | ICD-10-CM | POA: Diagnosis not present

## 2018-06-28 DIAGNOSIS — L719 Rosacea, unspecified: Secondary | ICD-10-CM | POA: Diagnosis not present

## 2018-08-14 DIAGNOSIS — Z1231 Encounter for screening mammogram for malignant neoplasm of breast: Secondary | ICD-10-CM | POA: Diagnosis not present

## 2018-08-14 LAB — HM MAMMOGRAPHY

## 2018-08-17 ENCOUNTER — Encounter: Payer: Self-pay | Admitting: Internal Medicine

## 2018-10-02 DIAGNOSIS — Z23 Encounter for immunization: Secondary | ICD-10-CM | POA: Diagnosis not present

## 2018-10-05 NOTE — Progress Notes (Addendum)
Subjective:   Tanya Walker is a 78 y.o. female who presents for Medicare Annual (Subsequent) preventive examination. I connected with patient by a telephone and verified that I am speaking with the correct person using two identifiers. Patient stated full name and DOB. Patient gave permission to continue with telephonic visit. Patient's location was at home and Nurse's location was at [place] office.   Review of Systems:   Cardiac Risk Factors include: advanced age (>77men, >72 women) Sleep patterns: feels rested on waking and sleeps 6-7 hours nightly.    Home Safety/Smoke Alarms: Feels safe in home. Smoke alarms in place.  Living environment; residence and Firearm Safety: apartment. Resides at [facility] independent living. Lives with husband, no needs for DME, good support system Seat Belt Safety/Bike Helmet: Wears seat belt.     Objective:     Vitals: There were no vitals taken for this visit.  There is no height or weight on file to calculate BMI.  Advanced Directives 10/08/2018 10/02/2017 09/29/2016 07/12/2013  Does Patient Have a Medical Advance Directive? Yes Yes Yes Patient would not like information  Type of Advance Directive [place];Living will [place];Living will [place];Living will -  Copy of [place] in Chart? No - copy requested No - copy requested No - copy requested -    Tobacco Social History   Tobacco Use  Smoking Status Never Smoker  Smokeless Tobacco Never Used     Counseling given: Not Answered  Past Medical History:  Diagnosis Date  . Anxiety    no meds  . Cancer ([place])    skin  . Lipoma of arm    Right  . Medical history non-contributory   . Osteoporosis   . SVD (spontaneous vaginal delivery)    x 2  . Varicose vein   . Vitamin D deficiency    Past Surgical History:  Procedure Laterality Date  . APPENDECTOMY  1947  . CHOLECYSTECTOMY  1988  . GANGLION CYST  EXCISION     x2 left and right arm  . HYSTEROSCOPY W/D&C N/A 07/15/2013   Procedure: DILATATION AND CURETTAGE /HYSTEROSCOPY;  Surgeon: Lovenia Kim, MD;  Location: [facility];  Service: Gynecology;  Laterality: N/A;  . [place]  . WISDOM TOOTH EXTRACTION     History reviewed. No pertinent family history. Social History   Socioeconomic History  . Marital status: Married    Spouse name: Not on file  . Number of children: 1  . Years of education: Not on file  . Highest education level: Not on file  Occupational History  . Occupation: retired  Scientific laboratory technician  . Financial resource strain: Not hard at all  . Food insecurity    Worry: Never true    Inability: Never true  . Transportation needs    Medical: No    Non-medical: No  Tobacco Use  . Smoking status: Never Smoker  . Smokeless tobacco: Never Used  Substance and Sexual Activity  . Alcohol use: No  . Drug use: No  . Sexual activity: Yes    Birth control/protection: Post-menopausal  Lifestyle  . Physical activity    Days per week: 5 days    Minutes per session: 30 min  . Stress: Only a little  Relationships  . Social connections    Talks on phone: More than three times a week    Gets together: More than three times a week  Attends religious service: 1 to 4 times per year    Active member of club or organization: Yes    Attends meetings of clubs or organizations: More than 4 times per year    Relationship status: Married  Other Topics Concern  . Not on file  Social History Narrative   GI in [place] for colonoscopy   Dr Ronita Hipps - GYN      Denies Surgical history      Family history of varicose veins   Mother is 42      Regular exercise - NO    Outpatient Encounter Medications as of 10/08/2018  Medication Sig  . Cholecalciferol (VITAMIN D3) 2000 units capsule Take 1 capsule (2,000 Units total) by mouth daily.  . Cyanocobalamin (VITAMIN B12 PO) Take 500 mg by mouth daily.  . metroNIDAZOLE (METROCREAM)  0.75 % cream APP EXT AA BID  . Multiple Vitamins-Minerals (PRESERVISION AREDS PO) Take 1 tablet by mouth.  . Omega-3 Fatty Acids (FISH OIL OMEGA-3 PO) Take 1 tablet by mouth daily.  Marland Kitchen OVER THE COUNTER MEDICATION Solgar Calcium 600, Vit D 80IU, Mag 300 mg  Take 3 by mouth daily.  Marland Kitchen OVER THE COUNTER MEDICATION Nature's Bounty D3 2000 IU daily.   No facility-administered encounter medications on file as of 10/08/2018.     Activities of Daily Living In your present state of health, do you have any difficulty performing the following activities: 10/08/2018  Hearing? N  Vision? N  Difficulty concentrating or making decisions? N  Walking or climbing stairs? N  Dressing or bathing? N  Doing errands, shopping? N  Preparing Food and eating ? N  Using the Toilet? N  In the past six months, have you accidently leaked urine? N  Do you have problems with loss of bowel control? N  Managing your Medications? N  Managing your Finances? N  Housekeeping or managing your Housekeeping? N  Some recent data might be hidden    Patient Care Team: Plotnikov, [name], MD as PCP - General    Assessment:   This is a routine wellness examination for Tanya Walker. Physical assessment deferred to PCP.  Exercise Activities and Dietary recommendations Current Exercise Habits: Home exercise routine, Type of exercise: walking, Time (Minutes): 30, Frequency (Times/Week): 6, Weekly Exercise (Minutes/Week): 180, Intensity: Mild, Exercise limited by: None identified  Diet (meal preparation, eat out, water intake, caffeinated beverages, dairy products, fruits and vegetables): in general, a "healthy" diet  , well balanced   Reviewed heart healthy diet. Encouraged patient to increase daily water and healthy fluid intake.  Goals    . Increase exercise     Called the gym facility at [facility] to be shown proper technique and how to use equipment. Start to work out 2 times weekly.     . Patient Stated     Stay as  healthy and as independent as possible. Start to use some light weights during my workouts.      . Patient Stated     I want to increase the amount weights I use when I work out to help with my osteoporosis.       Fall Risk Fall Risk  10/08/2018 10/02/2017 09/29/2016 10/08/2015 07/17/2014  Falls in the past year? 0 No No No No  Comment - - - Emmi Telephone Survey: data to providers prior to load -  Number falls in past yr: 0 - - - -  Injury with Fall? 0 - - - -  Risk for  fall due to : Impaired balance/gait - - - -   Depression Screen PHQ 2/9 Scores 10/08/2018 10/02/2017 09/29/2016 07/17/2014  PHQ - 2 Score 1 1 0 0  PHQ- 9 Score 2 2 1  -     Cognitive Function MMSE - Mini Mental State Exam 10/02/2017  Not completed: Refused     6CIT Screen 10/08/2018  What Year? 0 points  What month? 0 points  What time? 0 points  Count back from 20 0 points  Months in reverse 0 points  Repeat phrase 0 points  Total Score 0    Immunization History  Administered Date(s) Administered  . DT 04/10/2002  . Influenza Split 03/08/2011, 11/17/2011  . Influenza Whole 01/09/2007, 11/05/2008, 11/04/2009  . Influenza, High Dose Seasonal PF 12/07/2015, 09/29/2016, 11/09/2017, 09/27/2018  . Influenza-Unspecified 10/30/2012, 11/29/2013  . PPD Test 12/22/2015, 01/05/2016  . Pneumococcal Conjugate-13 07/17/2014  . Pneumococcal Polysaccharide-23 08/15/2005, 09/25/2015  . Tetanus 12/24/2012  . Zoster 12/05/2013   Screening Tests Health Maintenance  Topic Date Due  . TETANUS/TDAP  12/25/2022  . INFLUENZA VACCINE  Completed  . DEXA SCAN  Completed  . PNA vac Low Risk Adult  Completed      Plan:     Reviewed health maintenance screenings with patient today and relevant education, vaccines, and/or referrals were provided.   Continue to eat heart healthy diet (full of fruits, vegetables, whole grains, lean protein, water--limit salt, fat, and sugar intake) and increase physical activity as tolerated.   Continue doing brain stimulating activities (puzzles, reading, adult coloring books, staying active) to keep memory sharp.   I have personally reviewed and noted the following in the patient's chart:   . Medical and social history . Use of alcohol, tobacco or illicit drugs  . Current medications and supplements . Functional ability and status . Nutritional status . Physical activity . Advanced directives . List of other physicians . Screenings to include cognitive, depression, and falls . Referrals and appointments  In addition, I have reviewed and discussed with patient certain preventive protocols, quality metrics, and best practice recommendations. A written personalized care plan for preventive services as well as general preventive health recommendations were provided to patient.     Michiel Cowboy, RN  10/08/2018   Medical screening examination/treatment/procedure(s) were performed by non-physician practitioner and as supervising physician I was immediately available for consultation/collaboration. I agree with above. Lew Dawes, MD

## 2018-10-08 ENCOUNTER — Ambulatory Visit (INDEPENDENT_AMBULATORY_CARE_PROVIDER_SITE_OTHER): Payer: Medicare Other | Admitting: *Deleted

## 2018-10-08 DIAGNOSIS — Z780 Asymptomatic menopausal state: Secondary | ICD-10-CM | POA: Diagnosis not present

## 2018-10-08 DIAGNOSIS — Z0000 Encounter for general adult medical examination without abnormal findings: Secondary | ICD-10-CM

## 2018-10-26 DIAGNOSIS — H01001 Unspecified blepharitis right upper eyelid: Secondary | ICD-10-CM | POA: Diagnosis not present

## 2018-10-26 DIAGNOSIS — H01004 Unspecified blepharitis left upper eyelid: Secondary | ICD-10-CM | POA: Diagnosis not present

## 2018-10-26 DIAGNOSIS — H524 Presbyopia: Secondary | ICD-10-CM | POA: Diagnosis not present

## 2018-10-26 DIAGNOSIS — H353132 Nonexudative age-related macular degeneration, bilateral, intermediate dry stage: Secondary | ICD-10-CM | POA: Diagnosis not present

## 2019-02-11 DIAGNOSIS — Z23 Encounter for immunization: Secondary | ICD-10-CM | POA: Diagnosis not present

## 2019-02-14 DIAGNOSIS — M720 Palmar fascial fibromatosis [Dupuytren]: Secondary | ICD-10-CM | POA: Insufficient documentation

## 2019-02-14 DIAGNOSIS — M79642 Pain in left hand: Secondary | ICD-10-CM | POA: Diagnosis not present

## 2019-02-25 ENCOUNTER — Ambulatory Visit (INDEPENDENT_AMBULATORY_CARE_PROVIDER_SITE_OTHER): Payer: Medicare Other

## 2019-02-25 ENCOUNTER — Encounter: Payer: Self-pay | Admitting: Family

## 2019-02-25 ENCOUNTER — Ambulatory Visit (INDEPENDENT_AMBULATORY_CARE_PROVIDER_SITE_OTHER): Payer: Medicare Other | Admitting: Family

## 2019-02-25 ENCOUNTER — Other Ambulatory Visit: Payer: Self-pay

## 2019-02-25 VITALS — BP 114/72 | HR 68 | Temp 97.9°F | Ht 64.0 in | Wt 105.6 lb

## 2019-02-25 DIAGNOSIS — R2231 Localized swelling, mass and lump, right upper limb: Secondary | ICD-10-CM | POA: Diagnosis not present

## 2019-02-25 DIAGNOSIS — M7989 Other specified soft tissue disorders: Secondary | ICD-10-CM | POA: Diagnosis not present

## 2019-02-25 MED ORDER — SULFAMETHOXAZOLE-TRIMETHOPRIM 800-160 MG PO TABS: 1.0000 | ORAL_TABLET | Freq: Two times a day (BID) | ORAL | 0 refills | Status: DC

## 2019-02-25 MED ORDER — PREDNISONE 20 MG PO TABS: 20.0000 mg | ORAL_TABLET | Freq: Every day | ORAL | 0 refills | Status: DC

## 2019-02-25 NOTE — Progress Notes (Signed)
Tanya Walker is a 79 y.o. female with the following history as recorded in EpicCare:  Patient Active Problem List   Diagnosis Date Noted  . Tibialis posterior tendinitis, right 07/08/2016  . Ankle sprain 06/30/2016  . Peripheral vascular disease ([place]) 04/03/2016  . Ankle pain, left 03/31/2016  . Sore in nose 03/31/2016  . Well adult exam 09/25/2015  . Adjustment disorder with mixed anxiety and depressed mood 02/03/2015  . Allergic rhinitis 05/29/2014  . Rash 06/10/2013  . URI (upper respiratory infection) 03/08/2012  . LIPOMA 11/04/2009  . PARESTHESIA 12/26/2008  . ACUTE MAXILLARY SINUSITIS 11/22/2007  . WEIGHT LOSS, ABNORMAL 08/24/2007  . Vitamin D deficiency 02/14/2007  . VARICOSE VEIN 09/01/2006  . Osteoporosis 09/01/2006    Current Outpatient Medications  Medication Sig Dispense Refill  . Cholecalciferol (VITAMIN D3) 2000 units capsule Take 1 capsule (2,000 Units total) by mouth daily. 100 capsule 3  . Cyanocobalamin (VITAMIN B12 PO) Take 500 mg by mouth daily.    Marland Kitchen FLUZONE HIGH-DOSE QUADRIVALENT 0.7 ML SUSY ADM 0.7ML IM UTD    . metroNIDAZOLE (METROCREAM) 0.75 % cream APP EXT AA BID  3  . Multiple Vitamins-Minerals (PRESERVISION AREDS PO) Take 1 tablet by mouth.    . Omega-3 Fatty Acids (FISH OIL OMEGA-3 PO) Take 1 tablet by mouth daily.    Marland Kitchen OVER THE COUNTER MEDICATION Solgar Calcium 600, Vit D 80IU, Mag 300 mg  Take 3 by mouth daily.    Marland Kitchen OVER THE COUNTER MEDICATION Nature's Bounty D3 2000 IU daily.    . predniSONE (DELTASONE) 20 MG tablet Take 1 tablet (20 mg total) by mouth daily with breakfast. 5 tablet 0  . sulfamethoxazole-trimethoprim (BACTRIM DS) 800-160 MG tablet Take 1 tablet by mouth 2 (two) times daily. 10 tablet 0   No current facility-administered medications for this visit.    Allergies: Keflex [cephalexin], Alendronate sodium, and Risedronate sodium  Past Medical History:  Diagnosis Date  . Anxiety    no meds  . Cancer ([place])    skin  . Lipoma of  arm    Right  . Medical history non-contributory   . Osteoporosis   . SVD (spontaneous vaginal delivery)    x 2  . Varicose vein   . Vitamin D deficiency     Past Surgical History:  Procedure Laterality Date  . APPENDECTOMY  1947  . CHOLECYSTECTOMY  1988  . GANGLION CYST EXCISION     x2 left and right arm  . HYSTEROSCOPY WITH D & C N/A 07/15/2013   Procedure: DILATATION AND CURETTAGE /HYSTEROSCOPY;  Surgeon: Lovenia Kim, MD;  Location: [facility];  Service: Gynecology;  Laterality: N/A;  . [place]  . WISDOM TOOTH EXTRACTION      History reviewed. No pertinent family history.  Social History   Tobacco Use  . Smoking status: Never Smoker  . Smokeless tobacco: Never Used  Substance Use Topics  . Alcohol use: No    Subjective:  Right index finger swollen x 1 week; no specific injury or trauma; complaining that her finger itches; patient feels that she is having an allergic reaction as her 2nd and 3rd finger are starting to exhibit similar symptoms of redness/ itching; has been using new wipes to treat her blepharitis and applies with her right hand but does not have any itching or swelling around her eyes; also notes that prior to onset of symptoms, she used her bare fingers to clean bird poop/ dirt off her windshield;  has no history of gout; has full range of motion in her fingers;   Objective:  Vitals:   02/25/19 1016  BP: 114/72  Pulse: 68  Temp: 97.9 F (36.6 C)  TempSrc: Oral  SpO2: 97%  Weight: 105 lb 9.6 oz (47.9 kg)  Height: 5\' 4"  (1.626 m)    General: Well developed, well nourished, in no acute distress  Skin : Warm and dry. Localized areas of erythema noted on 1st, 2nd and 3rd fingers- eczema type lesions noted;  Head: Normocephalic and atraumatic  Eyes: Sclera and conjunctiva clear; pupils round and reactive to light; extraocular movements intact  Ears: External normal; canals clear; tympanic membranes normal  Oropharynx: Pink, supple. No  suspicious lesions  Neck: Supple without thyromegaly, adenopathy  Lungs: Respirations unlabored;  Musculoskeletal: No deformities; no active joint inflammation; swelling is noted on tip of 1st finger right hand; FROM on active and passive motion Extremities: No edema, cyanosis, clubbing  Vessels: Symmetric bilaterally  Neurologic: Alert and oriented; speech intact; face symmetrical; moves all extremities well; CNII-XII intact without focal deficit   Assessment:  1. Localized swelling of finger of right hand     Plan:  Symptoms appear c/w allergy; she will hold the eye wipes for now but suspect this is not the source as her eyes are fine; will update X-ray and treat with 5 days of prednisone and Bactrim bid x 5 days; follow-up to be determined.   This visit occurred during the SARS-CoV-2 public health emergency.  Safety protocols were in place, including screening questions prior to the visit, additional usage of staff PPE, and extensive cleaning of exam room while observing appropriate contact time as indicated for disinfecting solutions.     No follow-ups on file.  Orders Placed This Encounter  Procedures  . DG Hand Complete Right    Order Specific Question:   Reason for Exam (SYMPTOM  OR DIAGNOSIS REQUIRED)    Answer:   right hand xray    Order Specific Question:   Preferred imaging location?    Answer:   [facility]    Order Specific Question:   Radiology Contrast Protocol - do NOT remove file path    Answer:   [email or web address]    Requested Prescriptions   Signed Prescriptions Disp Refills  . predniSONE (DELTASONE) 20 MG tablet 5 tablet 0    Sig: Take 1 tablet (20 mg total) by mouth daily with breakfast.  . sulfamethoxazole-trimethoprim (BACTRIM DS) 800-160 MG tablet 10 tablet 0    Sig: Take 1 tablet by mouth 2 (two) times daily.

## 2019-03-11 DIAGNOSIS — Z23 Encounter for immunization: Secondary | ICD-10-CM | POA: Diagnosis not present

## 2019-03-20 ENCOUNTER — Other Ambulatory Visit: Payer: Self-pay

## 2019-03-20 ENCOUNTER — Encounter: Payer: Self-pay | Admitting: Family

## 2019-03-20 ENCOUNTER — Ambulatory Visit (INDEPENDENT_AMBULATORY_CARE_PROVIDER_SITE_OTHER): Payer: Medicare Other | Admitting: Family

## 2019-03-20 VITALS — BP 114/80 | HR 71 | Temp 98.0°F | Ht 64.0 in | Wt 106.0 lb

## 2019-03-20 DIAGNOSIS — R21 Rash and other nonspecific skin eruption: Secondary | ICD-10-CM

## 2019-03-20 MED ORDER — NYSTATIN-TRIAMCINOLONE 100000-0.1 UNIT/GM-% EX CREA: 1.0000 "application " | TOPICAL_CREAM | Freq: Two times a day (BID) | CUTANEOUS | 0 refills | Status: DC

## 2019-03-20 NOTE — Progress Notes (Signed)
Tanya Walker is a 79 y.o. female with the following history as recorded in EpicCare:  Patient Active Problem List   Diagnosis Date Noted  . Tibialis posterior tendinitis, right 07/08/2016  . Ankle sprain 06/30/2016  . Peripheral vascular disease ([place]) 04/03/2016  . Ankle pain, left 03/31/2016  . Sore in nose 03/31/2016  . Well adult exam 09/25/2015  . Adjustment disorder with mixed anxiety and depressed mood 02/03/2015  . Allergic rhinitis 05/29/2014  . Rash 06/10/2013  . URI (upper respiratory infection) 03/08/2012  . LIPOMA 11/04/2009  . PARESTHESIA 12/26/2008  . ACUTE MAXILLARY SINUSITIS 11/22/2007  . WEIGHT LOSS, ABNORMAL 08/24/2007  . Vitamin D deficiency 02/14/2007  . VARICOSE VEIN 09/01/2006  . Osteoporosis 09/01/2006    Current Outpatient Medications  Medication Sig Dispense Refill  . Cholecalciferol (VITAMIN D3) 2000 units capsule Take 1 capsule (2,000 Units total) by mouth daily. 100 capsule 3  . Cyanocobalamin (VITAMIN B12 PO) Take 500 mg by mouth daily.    Marland Kitchen FLUZONE HIGH-DOSE QUADRIVALENT 0.7 ML SUSY ADM 0.7ML IM UTD    . metroNIDAZOLE (METROCREAM) 0.75 % cream APP EXT AA BID  3  . Multiple Vitamins-Minerals (PRESERVISION AREDS PO) Take 1 tablet by mouth.    . Omega-3 Fatty Acids (FISH OIL OMEGA-3 PO) Take 1 tablet by mouth daily.    Marland Kitchen OVER THE COUNTER MEDICATION Solgar Calcium 600, Vit D 80IU, Mag 300 mg  Take 3 by mouth daily.    Marland Kitchen OVER THE COUNTER MEDICATION Nature's Bounty D3 2000 IU daily.    . predniSONE (DELTASONE) 20 MG tablet Take 1 tablet (20 mg total) by mouth daily with breakfast. 5 tablet 0  . sulfamethoxazole-trimethoprim (BACTRIM DS) 800-160 MG tablet Take 1 tablet by mouth 2 (two) times daily. 10 tablet 0  . nystatin-triamcinolone (MYCOLOG II) cream Apply 1 application topically 2 (two) times daily. 30 g 0   No current facility-administered medications for this visit.    Allergies: Keflex [cephalexin], Alendronate sodium, and Risedronate sodium   Past Medical History:  Diagnosis Date  . Anxiety    no meds  . Cancer ([place])    skin  . Lipoma of arm    Right  . Medical history non-contributory   . Osteoporosis   . SVD (spontaneous vaginal delivery)    x 2  . Varicose vein   . Vitamin D deficiency     Past Surgical History:  Procedure Laterality Date  . APPENDECTOMY  1947  . CHOLECYSTECTOMY  1988  . GANGLION CYST EXCISION     x2 left and right arm  . HYSTEROSCOPY WITH D & C N/A 07/15/2013   Procedure: DILATATION AND CURETTAGE /HYSTEROSCOPY;  Surgeon: Lovenia Kim, MD;  Location: [facility];  Service: Gynecology;  Laterality: N/A;  . [place]  . WISDOM TOOTH EXTRACTION      History reviewed. No pertinent family history.  Social History   Tobacco Use  . Smoking status: Never Smoker  . Smokeless tobacco: Never Used  Substance Use Topics  . Alcohol use: No    Subjective:  Was seen last month with suspected allergic reaction on her right hand- index finger most affected area; notes that symptoms have improved but not cleared completely; still having redness of her right index finger; denies any new soaps, foods, detergents or medications. Does mention concern that area of concern seemed to develop after being exposed to bird droppings while cleaning her windshield; Xray done last month was normal;  Objective:  Vitals:   03/20/19 1008  BP: 114/80  Pulse: 71  Temp: 98 F (36.7 C)  TempSrc: Oral  SpO2: 99%  Weight: 106 lb (48.1 kg)  Height: 5\' 4"  (1.626 m)    General: Well developed, well nourished, in no acute distress  Skin : Warm and dry. Erythema of right index finger noted Head: Normocephalic and atraumatic  Lungs: Respirations unlabored;  Neurologic: Alert and oriented; speech intact; face symmetrical; moves all extremities well; CNII-XII intact without focal deficit   Assessment:  1. Rash     Plan:  Still suspect eczema type reaction; however will also cover for possible fungal  exposure; Rx for Mycolog II cream to apply to her finger; if symptoms persist, she will discuss further with her dermatologist at that appointment next week.  This visit occurred during the SARS-CoV-2 public health emergency.  Safety protocols were in place, including screening questions prior to the visit, additional usage of staff PPE, and extensive cleaning of exam room while observing appropriate contact time as indicated for disinfecting solutions.     No follow-ups on file.  No orders of the defined types were placed in this encounter.   Requested Prescriptions   Signed Prescriptions Disp Refills  . nystatin-triamcinolone (MYCOLOG II) cream 30 g 0    Sig: Apply 1 application topically 2 (two) times daily.

## 2019-03-25 DIAGNOSIS — Z23 Encounter for immunization: Secondary | ICD-10-CM | POA: Diagnosis not present

## 2019-03-25 DIAGNOSIS — L821 Other seborrheic keratosis: Secondary | ICD-10-CM | POA: Diagnosis not present

## 2019-03-25 DIAGNOSIS — L309 Dermatitis, unspecified: Secondary | ICD-10-CM | POA: Diagnosis not present

## 2019-04-11 DIAGNOSIS — M7989 Other specified soft tissue disorders: Secondary | ICD-10-CM | POA: Diagnosis not present

## 2019-05-27 DIAGNOSIS — N368 Other specified disorders of urethra: Secondary | ICD-10-CM | POA: Diagnosis not present

## 2019-05-27 DIAGNOSIS — Z124 Encounter for screening for malignant neoplasm of cervix: Secondary | ICD-10-CM | POA: Diagnosis not present

## 2019-06-28 DIAGNOSIS — L578 Other skin changes due to chronic exposure to nonionizing radiation: Secondary | ICD-10-CM | POA: Diagnosis not present

## 2019-06-28 DIAGNOSIS — Z85828 Personal history of other malignant neoplasm of skin: Secondary | ICD-10-CM | POA: Diagnosis not present

## 2019-06-28 DIAGNOSIS — D225 Melanocytic nevi of trunk: Secondary | ICD-10-CM | POA: Diagnosis not present

## 2019-06-28 DIAGNOSIS — B353 Tinea pedis: Secondary | ICD-10-CM | POA: Diagnosis not present

## 2019-06-28 DIAGNOSIS — D2271 Melanocytic nevi of right lower limb, including hip: Secondary | ICD-10-CM | POA: Diagnosis not present

## 2019-06-28 DIAGNOSIS — Z86018 Personal history of other benign neoplasm: Secondary | ICD-10-CM | POA: Diagnosis not present

## 2019-06-28 DIAGNOSIS — L821 Other seborrheic keratosis: Secondary | ICD-10-CM | POA: Diagnosis not present

## 2019-08-16 LAB — HM MAMMOGRAPHY

## 2019-08-22 ENCOUNTER — Encounter: Payer: Self-pay | Admitting: Internal Medicine

## 2019-10-10 ENCOUNTER — Ambulatory Visit (INDEPENDENT_AMBULATORY_CARE_PROVIDER_SITE_OTHER): Payer: Medicare Other

## 2019-10-10 ENCOUNTER — Other Ambulatory Visit: Payer: Self-pay

## 2019-10-10 VITALS — BP 120/60 | HR 72 | Temp 98.3°F | Resp 16 | Ht 64.0 in | Wt 102.0 lb

## 2019-10-10 DIAGNOSIS — Z0000 Encounter for general adult medical examination without abnormal findings: Secondary | ICD-10-CM

## 2019-10-10 NOTE — Patient Instructions (Signed)
Tanya Walker , Thank you for taking time to come for your Medicare Wellness Visit. I appreciate your ongoing commitment to your health goals. Please review the following plan we discussed and let me know if I can assist you in the future.   Screening recommendations/referrals: Colonoscopy: no repeat due to age 79: 08/16/2019 Bone Density: 10/01/2015; due every 2-3 years Recommended yearly ophthalmology/optometry visit for glaucoma screening and checkup Recommended yearly dental visit for hygiene and checkup  Vaccinations: Influenza vaccine: 10/02/2018 Pneumococcal vaccine: completed Tdap vaccine: 12/24/2012; due 2024 Shingles vaccine: completed Covid-19: completed  Advanced directives: Please bring a copy of your health care power of attorney and living will to the office at your convenience.  Conditions/risks identified: Yes. Reviewed health maintenance screenings with patient today and relevant education, vaccines, and/or referrals were provided. Continue doing brain stimulating activities (puzzles, reading, adult coloring books, staying active) to keep memory sharp. Continue to eat heart healthy diet (full of fruits, vegetables, whole grains, lean protein, water--limit salt, fat, and sugar intake) and increase physical activity as tolerated.  Next appointment: Please schedule your next Medicare Wellness Visit with your Nurse Health Advisor in 1 year.  Preventive Care 27 Years and Older, Female Preventive care refers to lifestyle choices and visits with your health care provider that can promote health and wellness. What does preventive care include?  A yearly physical exam. This is also called an annual well check.  Dental exams once or twice a year.  Routine eye exams. Ask your health care provider how often you should have your eyes checked.  Personal lifestyle choices, including:  Daily care of your teeth and gums.  Regular physical activity.  Eating a healthy  diet.  Avoiding tobacco and drug use.  Limiting alcohol use.  Practicing safe sex.  Taking low-dose aspirin every day.  Taking vitamin and mineral supplements as recommended by your health care provider. What happens during an annual well check? The services and screenings done by your health care provider during your annual well check will depend on your age, overall health, lifestyle risk factors, and family history of disease. Counseling  Your health care provider may ask you questions about your:  Alcohol use.  Tobacco use.  Drug use.  Emotional well-being.  Home and relationship well-being.  Sexual activity.  Eating habits.  History of falls.  Memory and ability to understand (cognition).  Work and work Statistician.  Reproductive health. Screening  You may have the following tests or measurements:  Height, weight, and BMI.  Blood pressure.  Lipid and cholesterol levels. These may be checked every 5 years, or more frequently if you are over 47 years old.  Skin check.  Lung cancer screening. You may have this screening every year starting at age 36 if you have a 30-pack-year history of smoking and currently smoke or have quit within the past 15 years.  Fecal occult blood test (FOBT) of the stool. You may have this test every year starting at age 71.  Flexible sigmoidoscopy or colonoscopy. You may have a sigmoidoscopy every 5 years or a colonoscopy every 10 years starting at age 17.  Hepatitis C blood test.  Hepatitis B blood test.  Sexually transmitted disease (STD) testing.  Diabetes screening. This is done by checking your blood sugar (glucose) after you have not eaten for a while (fasting). You may have this done every 1-3 years.  Bone density scan. This is done to screen for osteoporosis. You may have this done starting at age  65.  Mammogram. This may be done every 1-2 years. Talk to your health care provider about how often you should have  regular mammograms. Talk with your health care provider about your test results, treatment options, and if necessary, the need for more tests. Vaccines  Your health care provider may recommend certain vaccines, such as:  Influenza vaccine. This is recommended every year.  Tetanus, diphtheria, and acellular pertussis (Tdap, Td) vaccine. You may need a Td booster every 10 years.  Zoster vaccine. You may need this after age 14.  Pneumococcal 13-valent conjugate (PCV13) vaccine. One dose is recommended after age 34.  Pneumococcal polysaccharide (PPSV23) vaccine. One dose is recommended after age 49. Talk to your health care provider about which screenings and vaccines you need and how often you need them. This information is not intended to replace advice given to you by your health care provider. Make sure you discuss any questions you have with your health care provider. Document Released: 02/20/2015 Document Revised: 10/14/2015 Document Reviewed: 11/25/2014 Elsevier Interactive Patient Education  2017 [place] Prevention in the Home Falls can cause injuries. They can happen to people of all ages. There are many things you can do to make your home safe and to help prevent falls. What can I do on the outside of my home?  Regularly fix the edges of walkways and driveways and fix any cracks.  Remove anything that might make you trip as you walk through a door, such as a raised step or threshold.  Trim any bushes or trees on the path to your home.  Use bright outdoor lighting.  Clear any walking paths of anything that might make someone trip, such as rocks or tools.  Regularly check to see if handrails are loose or broken. Make sure that both sides of any steps have handrails.  Any raised decks and porches should have guardrails on the edges.  Have any leaves, snow, or ice cleared regularly.  Use sand or salt on walking paths during winter.  Clean up any spills in your  garage right away. This includes oil or grease spills. What can I do in the bathroom?  Use night lights.  Install grab bars by the toilet and in the tub and shower. Do not use towel bars as grab bars.  Use non-skid mats or decals in the tub or shower.  If you need to sit down in the shower, use a plastic, non-slip stool.  Keep the floor dry. Clean up any water that spills on the floor as soon as it happens.  Remove soap buildup in the tub or shower regularly.  Attach bath mats securely with double-sided non-slip rug tape.  Do not have throw rugs and other things on the floor that can make you trip. What can I do in the bedroom?  Use night lights.  Make sure that you have a light by your bed that is easy to reach.  Do not use any sheets or blankets that are too big for your bed. They should not hang down onto the floor.  Have a firm chair that has side arms. You can use this for support while you get dressed.  Do not have throw rugs and other things on the floor that can make you trip. What can I do in the kitchen?  Clean up any spills right away.  Avoid walking on wet floors.  Keep items that you use a lot in easy-to-reach places.  If you need  to reach something above you, use a strong step stool that has a grab bar.  Keep electrical cords out of the way.  Do not use floor polish or wax that makes floors slippery. If you must use wax, use non-skid floor wax.  Do not have throw rugs and other things on the floor that can make you trip. What can I do with my stairs?  Do not leave any items on the stairs.  Make sure that there are handrails on both sides of the stairs and use them. Fix handrails that are broken or loose. Make sure that handrails are as long as the stairways.  Check any carpeting to make sure that it is firmly attached to the stairs. Fix any carpet that is loose or worn.  Avoid having throw rugs at the top or bottom of the stairs. If you do have throw  rugs, attach them to the floor with carpet tape.  Make sure that you have a light switch at the top of the stairs and the bottom of the stairs. If you do not have them, ask someone to add them for you. What else can I do to help prevent falls?  Wear shoes that:  Do not have high heels.  Have rubber bottoms.  Are comfortable and fit you well.  Are closed at the toe. Do not wear sandals.  If you use a stepladder:  Make sure that it is fully opened. Do not climb a closed stepladder.  Make sure that both sides of the stepladder are locked into place.  Ask someone to hold it for you, if possible.  Clearly mark and make sure that you can see:  Any grab bars or handrails.  First and last steps.  Where the edge of each step is.  Use tools that help you move around (mobility aids) if they are needed. These include:  Canes.  Walkers.  Scooters.  Crutches.  Turn on the lights when you go into a dark area. Replace any light bulbs as soon as they burn out.  Set up your furniture so you have a clear path. Avoid moving your furniture around.  If any of your floors are uneven, fix them.  If there are any pets around you, be aware of where they are.  Review your medicines with your doctor. Some medicines can make you feel dizzy. This can increase your chance of falling. Ask your doctor what other things that you can do to help prevent falls. This information is not intended to replace advice given to you by your health care provider. Make sure you discuss any questions you have with your health care provider. Document Released: 11/20/2008 Document Revised: 07/02/2015 Document Reviewed: 02/28/2014 Elsevier Interactive Patient Education  2017 [employer].

## 2019-10-10 NOTE — Progress Notes (Signed)
Subjective:   Tanya Walker is a 79 y.o. female who presents for Medicare Annual (Subsequent) preventive examination.  Review of Systems    No ROS. Medicare Wellness Visit. Cardiac Risk Factors include: advanced age (>63men, >37 women)     Objective:    Today's Vitals   10/10/19 1238  BP: 120/60  Pulse: 72  Resp: 16  Temp: 98.3 F (36.8 C)  SpO2: 97%  Weight: 102 lb (46.3 kg)  Height: 5\' 4"  (1.626 m)  PainSc: 0-No pain   Body mass index is 17.51 kg/m.  Advanced Directives 10/10/2019 10/08/2018 10/02/2017 09/29/2016 07/12/2013  Does Patient Have a Medical Advance Directive? Yes Yes Yes Yes Patient would not like information  Type of Advance Directive [place];Living will [place];Living will [place];Living will [place];Living will -  Does patient want to make changes to medical advance directive? No - Patient declined - - - -  Copy of [place] in Chart? No - copy requested No - copy requested No - copy requested No - copy requested -    Current Medications (verified) Outpatient Encounter Medications as of 10/10/2019  Medication Sig  . Cholecalciferol (VITAMIN D3) 2000 units capsule Take 1 capsule (2,000 Units total) by mouth daily.  . Cyanocobalamin (VITAMIN B12 PO) Take 500 mg by mouth daily.  Marland Kitchen FLUZONE HIGH-DOSE QUADRIVALENT 0.7 ML SUSY ADM 0.7ML IM UTD  . metroNIDAZOLE (METROCREAM) 0.75 % cream APP EXT AA BID  . Multiple Vitamins-Minerals (PRESERVISION AREDS PO) Take 1 tablet by mouth.  . nystatin-triamcinolone (MYCOLOG II) cream Apply 1 application topically 2 (two) times daily.  . Omega-3 Fatty Acids (FISH OIL OMEGA-3 PO) Take 1 tablet by mouth daily.  Marland Kitchen OVER THE COUNTER MEDICATION Solgar Calcium 600, Vit D 80IU, Mag 300 mg  Take 3 by mouth daily.  Marland Kitchen OVER THE COUNTER MEDICATION Nature's Bounty D3 2000 IU daily.  . predniSONE (DELTASONE) 20 MG tablet Take 1 tablet (20 mg  total) by mouth daily with breakfast.  . sulfamethoxazole-trimethoprim (BACTRIM DS) 800-160 MG tablet Take 1 tablet by mouth 2 (two) times daily.   No facility-administered encounter medications on file as of 10/10/2019.    Allergies (verified) Keflex [cephalexin], Alendronate sodium, and Risedronate sodium   History: Past Medical History:  Diagnosis Date  . Anxiety    no meds  . Cancer ([place])    skin  . Lipoma of arm    Right  . Medical history non-contributory   . Osteoporosis   . SVD (spontaneous vaginal delivery)    x 2  . Varicose vein   . Vitamin D deficiency    Past Surgical History:  Procedure Laterality Date  . APPENDECTOMY  1947  . CHOLECYSTECTOMY  1988  . GANGLION CYST EXCISION     x2 left and right arm  . HYSTEROSCOPY WITH D & C N/A 07/15/2013   Procedure: DILATATION AND CURETTAGE /HYSTEROSCOPY;  Surgeon: Lovenia Kim, MD;  Location: [facility];  Service: Gynecology;  Laterality: N/A;  . [place]  . WISDOM TOOTH EXTRACTION     History reviewed. No pertinent family history. Social History   Socioeconomic History  . Marital status: Married    Spouse name: Not on file  . Number of children: 1  . Years of education: Not on file  . Highest education level: Not on file  Occupational History  . Occupation: retired  Tobacco Use  . Smoking  status: Never Smoker  . Smokeless tobacco: Never Used  Vaping Use  . Vaping Use: Never used  Substance and Sexual Activity  . Alcohol use: No  . Drug use: No  . Sexual activity: Yes    Birth control/protection: Post-menopausal  Other Topics Concern  . Not on file  Social History Narrative   GI in [place] for colonoscopy   Dr Ronita Hipps - GYN      Denies Surgical history      Family history of varicose veins   Mother is 24      Regular exercise - NO   Social Determinants of Health   Financial Resource Strain: Low Risk   . Difficulty of Paying Living Expenses: Not hard at all  Food Insecurity: No Food  Insecurity  . Worried About Charity fundraiser in the Last Year: Never true  . Ran Out of Food in the Last Year: Never true  Transportation Needs: No Transportation Needs  . Lack of Transportation (Medical): No  . Lack of Transportation (Non-Medical): No  Physical Activity: Sufficiently Active  . Days of Exercise per Week: 5 days  . Minutes of Exercise per Session: 30 min  Stress: Stress Concern Present  . Feeling of Stress : Rather much  Social Connections: Moderately Integrated  . Frequency of Communication with Friends and Family: More than three times a week  . Frequency of Social Gatherings with Friends and Family: Never  . Attends Religious Services: Never  . Active Member of Clubs or Organizations: Yes  . Attends Archivist Meetings: Never  . Marital Status: Married    Tobacco Counseling Counseling given: Not Answered   Clinical Intake:  Pre-visit preparation completed: Yes  Pain : No/denies pain Pain Score: 0-No pain     BMI - recorded: 17.51 Nutritional Status: BMI <19  Underweight Nutritional Risks: None Diabetes: No  How often do you need to have someone help you when you read instructions, pamphlets, or other written materials from your doctor or pharmacy?: 1 - Never What is the last grade level you completed in school?: 1.5 years of college  Diabetic? no  Interpreter Needed?: No  Information entered by :: Maribeth Jiles N. Keone Kamer, LPN   Activities of Daily Living In your present state of health, do you have any difficulty performing the following activities: 10/10/2019  Hearing? Y  Comment complains of hearing loss in right ear  Vision? N  Difficulty concentrating or making decisions? Y  Comment names  Walking or climbing stairs? N  Dressing or bathing? N  Doing errands, shopping? N  Preparing Food and eating ? N  Using the Toilet? N  In the past six months, have you accidently leaked urine? N  Do you have problems with loss of bowel  control? N  Managing your Medications? N  Managing your Finances? N  Housekeeping or managing your Housekeeping? N  Some recent data might be hidden    Patient Care Team: Plotnikov, [name], MD as PCP - General  Indicate any recent Medical Services you may have received from other than [facility] providers in the past year (date may be approximate).     Assessment:   This is a routine wellness examination for Tanya Walker.  Hearing/Vision screen No exam data present  Dietary issues and exercise activities discussed: Current Exercise Habits: Home exercise routine, Type of exercise: walking (walks in neighborhood and it depends on the weather), Time (Minutes): 30, Frequency (Times/Week): 5, Weekly Exercise (Minutes/Week): 150, Intensity: Mild, Exercise  limited by: psychological condition(s)  Goals    . Increase exercise     Called the gym facility at [facility] to be shown proper technique and how to use equipment. Start to work out 2 times weekly.     . Patient Stated     Stay as healthy and as independent as possible. Start to use some light weights during my workouts.      . Patient Stated     I want to increase the amount weights I use when I work out to help with my osteoporosis.      Depression Screen PHQ 2/9 Scores 10/10/2019 10/08/2018 10/02/2017 09/29/2016 07/17/2014  PHQ - 2 Score 1 1 1  0 0  PHQ- 9 Score - 2 2 1  -    Fall Risk Fall Risk  10/10/2019 10/08/2018 10/02/2017 09/29/2016 10/08/2015  Falls in the past year? 0 0 No No No  Comment - - - - Emmi Telephone Survey: data to providers prior to load  Number falls in past yr: 0 0 - - -  Injury with Fall? 0 0 - - -  Risk for fall due to : No Fall Risks - - - -  Follow up Falls evaluation completed - - - -    Any stairs in or around the home? Yes  If so, are there any without handrails? No  Home free of loose throw rugs in walkways, pet beds, electrical cords, etc? Yes  Adequate lighting in your home to reduce risk of falls? Yes    ASSISTIVE DEVICES UTILIZED TO PREVENT FALLS:  Life alert? Yes  Use of a cane, walker or w/c? No  Grab bars in the bathroom? Yes  Shower chair or bench in shower? Yes  Elevated toilet seat or a handicapped toilet? Yes   TIMED UP AND GO:  Was the test performed? No .  Length of time to ambulate 10 feet: 0 sec.   Gait steady and fast without use of assistive device  Cognitive Function: MMSE - Mini Mental State Exam 10/02/2017  Not completed: Refused     6CIT Screen 10/10/2019 10/08/2018  What Year? 0 points 0 points  What month? 0 points 0 points  What time? 3 points 0 points  Count back from 20 0 points 0 points  Months in reverse 0 points 0 points  Repeat phrase 0 points 0 points  Total Score 3 0    Immunizations Immunization History  Administered Date(s) Administered  . DT (Pediatric) 04/10/2002  . Influenza Split 03/08/2011, 11/17/2011  . Influenza Whole 01/09/2007, 11/05/2008, 11/04/2009  . Influenza, High Dose Seasonal PF 12/07/2015, 09/29/2016, 11/09/2017, 09/27/2018  . Influenza-Unspecified 10/30/2012, 11/29/2013  . PPD Test 12/22/2015, 01/05/2016  . Pneumococcal Conjugate-13 07/17/2014  . Pneumococcal Polysaccharide-23 08/15/2005, 09/25/2015  . Tetanus 12/24/2012  . Zoster 12/05/2013    TDAP status: Up to date Flu Vaccine status: Up to date Pneumococcal vaccine status: Up to date Covid-19 vaccine status: Completed vaccines  Qualifies for Shingles Vaccine? Yes   Zostavax completed Yes   Shingrix Completed?: No.    Education has been provided regarding the importance of this vaccine. Patient has been advised to call insurance company to determine out of pocket expense if they have not yet received this vaccine. Advised may also receive vaccine at local pharmacy or Health Dept. Verbalized acceptance and understanding.  Screening Tests Health Maintenance  Topic Date Due  . COVID-19 Vaccine (1) Never done  . INFLUENZA VACCINE  09/08/2019  . TETANUS/TDAP  12/25/2022  . DEXA SCAN  Completed  . PNA vac Low Risk Adult  Completed    Health Maintenance  Health Maintenance Due  Topic Date Due  . COVID-19 Vaccine (1) Never done  . INFLUENZA VACCINE  09/08/2019    Colorectal cancer screening: No longer required.  Mammogram status: Completed 08/16/2019. Repeat every year Bone Density status: Completed 10/01/2015. Results reflect: Bone density results: OSTEOPOROSIS. Repeat every 2-3 years.  Lung Cancer Screening: (Low Dose CT Chest recommended if Age 16-80 years, 30 pack-year currently smoking OR have quit w/in 15years.) does not qualify.   Lung Cancer Screening Referral: no  Additional Screening:  Hepatitis C Screening: does not qualify; Completed no  Vision Screening: Recommended annual ophthalmology exams for early detection of glaucoma and other disorders of the eye. Is the patient up to date with their annual eye exam?  Yes  Who is the provider or what is the name of the office in which the patient attends annual eye exams? Katy Apo, MD If pt is not established with a provider, would they like to be referred to a provider to establish care? No .   Dental Screening: Recommended annual dental exams for proper oral hygiene  Community Resource Referral / Chronic Care Management: CRR required this visit?  No   CCM required this visit?  No      Plan:     I have personally reviewed and noted the following in the patient's chart:   . Medical and social history . Use of alcohol, tobacco or illicit drugs  . Current medications and supplements . Functional ability and status . Nutritional status . Physical activity . Advanced directives . List of other physicians . Hospitalizations, surgeries, and ER visits in previous 12 months . Vitals . Screenings to include cognitive, depression, and falls . Referrals and appointments  In addition, I have reviewed and discussed with patient certain preventive protocols, quality metrics,  and best practice recommendations. A written personalized care plan for preventive services as well as general preventive health recommendations were provided to patient.     Sheral Flow, LPN   06/15/9772   Nurse Notes: n/a

## 2019-10-11 ENCOUNTER — Ambulatory Visit: Payer: Self-pay

## 2019-10-17 DIAGNOSIS — B353 Tinea pedis: Secondary | ICD-10-CM | POA: Diagnosis not present

## 2019-10-17 DIAGNOSIS — B351 Tinea unguium: Secondary | ICD-10-CM | POA: Diagnosis not present

## 2019-10-28 ENCOUNTER — Encounter: Payer: Self-pay | Admitting: Internal Medicine

## 2019-10-28 ENCOUNTER — Other Ambulatory Visit: Payer: Self-pay

## 2019-10-28 ENCOUNTER — Ambulatory Visit (INDEPENDENT_AMBULATORY_CARE_PROVIDER_SITE_OTHER): Payer: Medicare Other | Admitting: Internal Medicine

## 2019-10-28 VITALS — BP 142/78 | HR 75 | Temp 98.3°F | Ht 64.0 in | Wt 106.0 lb

## 2019-10-28 DIAGNOSIS — E559 Vitamin D deficiency, unspecified: Secondary | ICD-10-CM | POA: Diagnosis not present

## 2019-10-28 DIAGNOSIS — J3089 Other allergic rhinitis: Secondary | ICD-10-CM | POA: Diagnosis not present

## 2019-10-28 DIAGNOSIS — I83813 Varicose veins of bilateral lower extremities with pain: Secondary | ICD-10-CM

## 2019-10-28 DIAGNOSIS — Z23 Encounter for immunization: Secondary | ICD-10-CM | POA: Diagnosis not present

## 2019-10-28 DIAGNOSIS — E785 Hyperlipidemia, unspecified: Secondary | ICD-10-CM

## 2019-10-28 MED ORDER — CETIRIZINE HCL 10 MG PO TABS: 10.0000 mg | ORAL_TABLET | Freq: Every day | ORAL | 11 refills | Status: AC

## 2019-10-28 MED ORDER — FLUTICASONE PROPIONATE 50 MCG/ACT NA SUSP: 2.0000 | Freq: Every day | NASAL | 6 refills | Status: DC

## 2019-10-28 MED ORDER — MONTELUKAST SODIUM 10 MG PO TABS: 10.0000 mg | ORAL_TABLET | Freq: Every day | ORAL | 5 refills | Status: DC

## 2019-10-28 NOTE — Progress Notes (Signed)
Subjective:  Patient ID: Tanya Walker, female    DOB: 1941/01/14  Age: 79 y.o. MRN: [id number]  CC: No chief complaint on file.   HPI ARSHI DUARTE presents for allergies, sneezing, cough - starts after breakfast  C/o constipation - Miralax helps  C/o varic veins - B ankles - worse   Outpatient Medications Prior to Visit  Medication Sig Dispense Refill  . Cholecalciferol (VITAMIN D3) 2000 units capsule Take 1 capsule (2,000 Units total) by mouth daily. 100 capsule 3  . ciclopirox (PENLAC) 8 % solution Apply topically daily.    . Cyanocobalamin (VITAMIN B12 PO) Take 500 mg by mouth daily.    Marland Kitchen estradiol (ESTRACE) 0.1 MG/GM vaginal cream USE 1 GRAM VAGINALLY 2 TIMES A WEEK    . FLUZONE HIGH-DOSE QUADRIVALENT 0.7 ML SUSY ADM 0.7ML IM UTD    . ketoconazole (NIZORAL) 2 % cream Apply topically 2 (two) times daily.    . metroNIDAZOLE (METROCREAM) 0.75 % cream APP EXT AA BID  3  . Multiple Vitamins-Minerals (PRESERVISION AREDS PO) Take 1 tablet by mouth.    . nystatin-triamcinolone (MYCOLOG II) cream Apply 1 application topically 2 (two) times daily. 30 g 0  . Omega-3 Fatty Acids (FISH OIL OMEGA-3 PO) Take 1 tablet by mouth daily.    Marland Kitchen OVER THE COUNTER MEDICATION Solgar Calcium 600, Vit D 80IU, Mag 300 mg  Take 3 by mouth daily.    Marland Kitchen OVER THE COUNTER MEDICATION Nature's Bounty D3 2000 IU daily.    . predniSONE (DELTASONE) 20 MG tablet Take 1 tablet (20 mg total) by mouth daily with breakfast. 5 tablet 0  . SHINGRIX injection     . sulfamethoxazole-trimethoprim (BACTRIM DS) 800-160 MG tablet Take 1 tablet by mouth 2 (two) times daily. 10 tablet 0   No facility-administered medications prior to visit.    ROS: Review of Systems  Constitutional: Negative for activity change, appetite change, chills, fatigue and unexpected weight change.  HENT: Positive for congestion, rhinorrhea and sneezing. Negative for mouth sores, sinus pressure and sinus pain.   Eyes: Negative for visual  disturbance.  Respiratory: Negative for cough and chest tightness.   Gastrointestinal: Negative for abdominal pain and nausea.  Genitourinary: Negative for difficulty urinating, frequency and vaginal pain.  Musculoskeletal: Negative for back pain and gait problem.  Skin: Negative for pallor and rash.  Neurological: Negative for dizziness, tremors, weakness, numbness and headaches.  Psychiatric/Behavioral: Negative for confusion, sleep disturbance and suicidal ideas.    Objective:  BP (!) 142/78 (BP Location: Left Arm, Patient Position: Sitting, Cuff Size: Normal)   Pulse 75   Temp 98.3 F (36.8 C) (Oral)   Ht 5\' 4"  (1.626 m)   Wt 106 lb (48.1 kg)   SpO2 97%   BMI 18.19 kg/m   BP Readings from Last 3 Encounters:  10/28/19 (!) 142/78  10/10/19 120/60  03/20/19 114/80    Wt Readings from Last 3 Encounters:  10/28/19 106 lb (48.1 kg)  10/10/19 102 lb (46.3 kg)  03/20/19 106 lb (48.1 kg)    Physical Exam Constitutional:      General: She is not in acute distress.    Appearance: She is well-developed.  HENT:     Head: Normocephalic.     Right Ear: External ear normal.     Left Ear: External ear normal.     Nose: Nose normal.  Eyes:     General:        Right eye: No discharge.  Left eye: No discharge.     Conjunctiva/sclera: Conjunctivae normal.     Pupils: Pupils are equal, round, and reactive to light.  Neck:     Thyroid: No thyromegaly.     Vascular: No JVD.     Trachea: No tracheal deviation.  Cardiovascular:     Rate and Rhythm: Normal rate and regular rhythm.     Heart sounds: Normal heart sounds.  Pulmonary:     Effort: No respiratory distress.     Breath sounds: No stridor. No wheezing.  Abdominal:     General: Bowel sounds are normal. There is no distension.     Palpations: Abdomen is soft. There is no mass.     Tenderness: There is no abdominal tenderness. There is no guarding or rebound.  Musculoskeletal:        General: No tenderness.      Cervical back: Normal range of motion and neck supple.  Lymphadenopathy:     Cervical: No cervical adenopathy.  Skin:    Findings: No erythema or rash.  Neurological:     Cranial Nerves: No cranial nerve deficit.     Motor: No abnormal muscle tone.     Coordination: Coordination normal.     Deep Tendon Reflexes: Reflexes normal.  Psychiatric:        Behavior: Behavior normal.        Thought Content: Thought content normal.        Judgment: Judgment normal.      varic veins - B ankles  Lab Results  Component Value Date   WBC 7.8 12/05/2016   HGB 14.3 12/05/2016   HCT 43.2 12/05/2016   PLT 159.0 12/05/2016   GLUCOSE 113 (H) 12/05/2016   CHOL 182 12/05/2016   TRIG 56.0 12/05/2016   HDL 63.50 12/05/2016   LDLDIRECT 135.4 12/26/2008   LDLCALC 107 (H) 12/05/2016   ALT 14 12/05/2016   AST 19 12/05/2016   NA 138 12/05/2016   K 4.1 12/05/2016   CL 101 12/05/2016   CREATININE 0.65 12/05/2016   BUN 15 12/05/2016   CO2 27 12/05/2016   TSH 0.88 12/05/2016    DG Ankle Complete Right  Result Date: 06/30/2016 CLINICAL DATA:  Twisting injury to the right ankle with swelling and pain EXAM: RIGHT ANKLE - COMPLETE 3+ VIEW COMPARISON:  None. FINDINGS: Vascular soft tissue calcifications. Possible cortical avulsion off the dorsal, anterior aspect of the talus. Moderate plantar calcaneal spur. Questionable small avulsion along the medial aspect of the talus bone. Generalized soft tissue swelling. IMPRESSION: Possible small avulsion injury along the medial inferior aspect of talus, possibly from medial malleolus. Possible cortical avulsion off of the dorsal, anterior talus. Moderate plantar calcaneal spur. Electronically Signed   By: Donavan Foil M.D.   On: 06/30/2016 14:12    Assessment & Plan:    Walker Kehr, MD

## 2019-10-28 NOTE — Assessment & Plan Note (Signed)
?   etiology 

## 2019-10-28 NOTE — Assessment & Plan Note (Addendum)
Worse B ankles Ref to vasc clinic

## 2019-10-28 NOTE — Assessment & Plan Note (Signed)
Vit D 

## 2019-10-28 NOTE — Patient Instructions (Signed)
[facility] started vaccine booster sign up. Please call [facility] at [phone]. You can also call the venue where you had your initial COVID 19 vaccination.   

## 2019-10-31 DIAGNOSIS — H353132 Nonexudative age-related macular degeneration, bilateral, intermediate dry stage: Secondary | ICD-10-CM | POA: Diagnosis not present

## 2019-10-31 DIAGNOSIS — H5203 Hypermetropia, bilateral: Secondary | ICD-10-CM | POA: Diagnosis not present

## 2019-10-31 DIAGNOSIS — H2513 Age-related nuclear cataract, bilateral: Secondary | ICD-10-CM | POA: Diagnosis not present

## 2019-11-01 ENCOUNTER — Other Ambulatory Visit: Payer: Medicare Other

## 2019-11-01 DIAGNOSIS — I83813 Varicose veins of bilateral lower extremities with pain: Secondary | ICD-10-CM

## 2019-11-01 DIAGNOSIS — E785 Hyperlipidemia, unspecified: Secondary | ICD-10-CM

## 2019-11-01 LAB — COMPLETE METABOLIC PANEL WITH GFR
AG Ratio: 1.5 (calc) (ref 1.0–2.5)
ALT: 14 U/L (ref 6–29)
AST: 20 U/L (ref 10–35)
Albumin: 4.3 g/dL (ref 3.6–5.1)
Alkaline phosphatase (APISO): 61 U/L (ref 37–153)
BUN: 15 mg/dL (ref 7–25)
CO2: 26 mmol/L (ref 20–32)
Calcium: 9.5 mg/dL (ref 8.6–10.4)
Chloride: 101 mmol/L (ref 98–110)
Creat: 0.69 mg/dL (ref 0.60–0.88)
GFR, Est African American: 95 mL/min/{1.73_m2} (ref >=60)
GFR, Est Non African American: 82 mL/min/{1.73_m2} (ref >=60)
Globulin: 2.9 g/dL (calc) (ref 1.9–3.7)
Glucose, Bld: 84 mg/dL (ref 65–99)
Potassium: 3.9 mmol/L (ref 3.5–5.3)
Sodium: 140 mmol/L (ref 135–146)
Total Bilirubin: 0.5 mg/dL (ref 0.2–1.2)
Total Protein: 7.2 g/dL (ref 6.1–8.1)

## 2019-11-02 LAB — URINALYSIS
Bilirubin Urine: NEGATIVE
Glucose, UA: NEGATIVE
Hgb urine dipstick: NEGATIVE
Ketones, ur: NEGATIVE
Leukocytes,Ua: NEGATIVE
Nitrite: NEGATIVE
Protein, ur: NEGATIVE
Specific Gravity, Urine: 1.014 (ref 1.001–1.03)
pH: 6 (ref 5.0–8.0)

## 2019-11-02 LAB — CBC WITH DIFFERENTIAL/PLATELET
Absolute Monocytes: 692 cells/uL (ref 200–950)
Basophils Absolute: 53 cells/uL (ref 0–200)
Basophils Relative: 0.7 %
Eosinophils Absolute: 106 cells/uL (ref 15–500)
Eosinophils Relative: 1.4 %
HCT: 46.1 % — ABNORMAL HIGH (ref 35.0–45.0)
Hemoglobin: 14.9 g/dL (ref 11.7–15.5)
Lymphs Abs: 1718 cells/uL (ref 850–3900)
MCH: 30.1 pg (ref 27.0–33.0)
MCHC: 32.3 g/dL (ref 32.0–36.0)
MCV: 93.1 fL (ref 80.0–100.0)
MPV: 12.5 fL (ref 7.5–12.5)
Monocytes Relative: 9.1 %
Neutro Abs: 5031 cells/uL (ref 1500–7800)
Neutrophils Relative %: 66.2 %
Platelets: 172 10*3/uL (ref 140–400)
RBC: 4.95 10*6/uL (ref 3.80–5.10)
RDW: 12.2 % (ref 11.0–15.0)
Total Lymphocyte: 22.6 %
WBC: 7.6 10*3/uL (ref 3.8–10.8)

## 2019-11-02 LAB — LIPID PANEL
Cholesterol: 201 mg/dL — ABNORMAL HIGH (ref <200)
HDL: 69 mg/dL (ref >=50)
LDL Cholesterol (Calc): 118 mg/dL (calc) — ABNORMAL HIGH
Non-HDL Cholesterol (Calc): 132 mg/dL (calc) — ABNORMAL HIGH (ref <130)
Total CHOL/HDL Ratio: 2.9 (calc) (ref <5.0)
Triglycerides: 52 mg/dL (ref <150)

## 2019-11-02 LAB — TSH: TSH: 1.86 mIU/L (ref 0.40–4.50)

## 2019-12-03 ENCOUNTER — Other Ambulatory Visit: Payer: Self-pay | Admitting: *Deleted

## 2019-12-03 DIAGNOSIS — I83893 Varicose veins of bilateral lower extremities with other complications: Secondary | ICD-10-CM

## 2019-12-17 ENCOUNTER — Ambulatory Visit (HOSPITAL_COMMUNITY)
Admission: RE | Admit: 2019-12-17 | Discharge: 2019-12-17 | Disposition: A | Discharge status: Home / Self Care | Payer: Medicare Other | Source: Ambulatory Visit | Attending: Vascular Surgery | Admitting: Vascular Surgery

## 2019-12-17 ENCOUNTER — Ambulatory Visit (INDEPENDENT_AMBULATORY_CARE_PROVIDER_SITE_OTHER): Payer: Medicare Other | Admitting: Vascular Surgery

## 2019-12-17 ENCOUNTER — Other Ambulatory Visit: Payer: Self-pay

## 2019-12-17 ENCOUNTER — Encounter: Payer: Self-pay | Admitting: Vascular Surgery

## 2019-12-17 VITALS — BP 152/81 | HR 63 | Temp 97.7°F | Resp 14 | Ht 64.0 in | Wt 106.9 lb

## 2019-12-17 DIAGNOSIS — I83813 Varicose veins of bilateral lower extremities with pain: Secondary | ICD-10-CM | POA: Diagnosis not present

## 2019-12-17 DIAGNOSIS — I83893 Varicose veins of bilateral lower extremities with other complications: Secondary | ICD-10-CM | POA: Insufficient documentation

## 2019-12-17 DIAGNOSIS — Z23 Encounter for immunization: Secondary | ICD-10-CM | POA: Diagnosis not present

## 2019-12-17 NOTE — Progress Notes (Signed)
Patient name: Tanya Walker MRN: [id number] DOB: 02-19-1940 Sex: female  REASON FOR CONSULT: Evaluate bilateral lower extremity varicose veins  HPI: Tanya Walker is a 79 y.o. female, that presents for evaluation of bilateral lower extremity varicose veins.  Patient has a long history of lower extremity venous interventions dating back to 1969 when she had strippings done of what sounds like bilateral great saphenous veins.  She then went on to have multiple additional interventions including endovenous laser ablation and sclerotherapy of her lower extremities bilaterally on multiple occasions over the years at different locations.  She does not recall any of this being done at our practice.  Describes bilateral lower extremity leg heaviness.  No history of DVTs.  Not currently wearing compression.  Does not like the appearance of suspected varicose veins on her feet.  Past Medical History:  Diagnosis Date  . Anxiety    no meds  . Cancer ([place])    skin  . Lipoma of arm    Right  . Medical history non-contributory   . Osteoporosis   . SVD (spontaneous vaginal delivery)    x 2  . Varicose vein   . Vitamin D deficiency     Past Surgical History:  Procedure Laterality Date  . APPENDECTOMY  1947  . CHOLECYSTECTOMY  1988  . GANGLION CYST EXCISION     x2 left and right arm  . HYSTEROSCOPY WITH D & C N/A 07/15/2013   Procedure: DILATATION AND CURETTAGE /HYSTEROSCOPY;  Surgeon: Lovenia Kim, MD;  Location: [facility];  Service: Gynecology;  Laterality: N/A;  . [place]  . WISDOM TOOTH EXTRACTION      Family History  Problem Relation Age of Onset  . Hypertension Mother   . Heart disease Father   . Cancer Father     SOCIAL HISTORY: Social History   Socioeconomic History  . Marital status: Married    Spouse name: Not on file  . Number of children: 1  . Years of education: Not on file  . Highest education level: Not on file  Occupational History  . Occupation:  retired  Tobacco Use  . Smoking status: Never Smoker  . Smokeless tobacco: Never Used  Vaping Use  . Vaping Use: Never used  Substance and Sexual Activity  . Alcohol use: No  . Drug use: No  . Sexual activity: Yes    Birth control/protection: Post-menopausal  Other Topics Concern  . Not on file  Social History Narrative   GI in [place] for colonoscopy   Dr Ronita Hipps - GYN      Denies Surgical history      Family history of varicose veins   Mother is 61      Regular exercise - NO   Social Determinants of Health   Financial Resource Strain: Low Risk   . Difficulty of Paying Living Expenses: Not hard at all  Food Insecurity: No Food Insecurity  . Worried About Charity fundraiser in the Last Year: Never true  . Ran Out of Food in the Last Year: Never true  Transportation Needs: No Transportation Needs  . Lack of Transportation (Medical): No  . Lack of Transportation (Non-Medical): No  Physical Activity: Sufficiently Active  . Days of Exercise per Week: 5 days  . Minutes of Exercise per Session: 30 min  Stress: Stress Concern Present  . Feeling of Stress : Rather much  Social Connections: Moderately Integrated  . Frequency of Communication with  Friends and Family: More than three times a week  . Frequency of Social Gatherings with Friends and Family: Never  . Attends Religious Services: Never  . Active Member of Clubs or Organizations: Yes  . Attends Archivist Meetings: Never  . Marital Status: Married  Human resources officer Violence: Not At Risk  . Fear of Current or Ex-Partner: No  . Emotionally Abused: No  . Physically Abused: No  . Sexually Abused: No    Allergies  Allergen Reactions  . Keflex [Cephalexin]   . Alendronate Sodium Other (See Comments)    Leg cramps  . Alendronate Sodium Other (See Comments)    Leg cramps  . Risedronate Sodium     REACTION: ?? achy    Current Outpatient Medications  Medication Sig Dispense Refill  . Cholecalciferol  (VITAMIN D3) 2000 units capsule Take 1 capsule (2,000 Units total) by mouth daily. 100 capsule 3  . Cyanocobalamin (VITAMIN B12 PO) Take 500 mg by mouth daily.    Marland Kitchen estradiol (ESTRACE) 0.1 MG/GM vaginal cream USE 1 GRAM VAGINALLY 2 TIMES A WEEK    . Multiple Vitamins-Minerals (PRESERVISION AREDS PO) Take 1 tablet by mouth.    . Omega-3 Fatty Acids (FISH OIL OMEGA-3 PO) Take 1 tablet by mouth daily.    Marland Kitchen OVER THE COUNTER MEDICATION Solgar Calcium 600, Vit D 80IU, Mag 300 mg  Take 3 by mouth daily.    Marland Kitchen OVER THE COUNTER MEDICATION Nature's Bounty D3 2000 IU daily.    . cetirizine (ZYRTEC ALLERGY) 10 MG tablet Take 1 tablet (10 mg total) by mouth daily. (Patient not taking: Reported on 12/17/2019) 30 tablet 11  . ciclopirox (PENLAC) 8 % solution Apply topically daily. (Patient not taking: Reported on 12/17/2019)    . fluticasone (FLONASE) 50 MCG/ACT nasal spray Place 2 sprays into both nostrils daily. (Patient not taking: Reported on 12/17/2019) 16 g 6  . ketoconazole (NIZORAL) 2 % cream Apply topically 2 (two) times daily. (Patient not taking: Reported on 12/17/2019)    . metroNIDAZOLE (METROCREAM) 0.75 % cream APP EXT AA BID (Patient not taking: Reported on 12/17/2019)  3  . montelukast (SINGULAIR) 10 MG tablet Take 1 tablet (10 mg total) by mouth daily. (Patient not taking: Reported on 12/17/2019) 30 tablet 5  . nystatin-triamcinolone (MYCOLOG II) cream Apply 1 application topically 2 (two) times daily. (Patient not taking: Reported on 12/17/2019) 30 g 0   No current facility-administered medications for this visit.    REVIEW OF SYSTEMS:  [X]  denotes positive finding, [ ]  denotes negative finding Cardiac  Comments:  Chest pain or chest pressure:    Shortness of breath upon exertion:    Short of breath when lying flat:    Irregular heart rhythm:        Vascular    Pain in calf, thigh, or hip brought on by ambulation:    Pain in feet at night that wakes you up from your sleep:     Blood clot in  your veins:    Leg swelling:         Pulmonary    Oxygen at home:    Productive cough:     Wheezing:         Neurologic    Sudden weakness in arms or legs:     Sudden numbness in arms or legs:     Sudden onset of difficulty speaking or slurred speech:    Temporary loss of vision in one eye:  Problems with dizziness:         Gastrointestinal    Blood in stool:     Vomited blood:         Genitourinary    Burning when urinating:     Blood in urine:        Psychiatric    Major depression:         Hematologic    Bleeding problems:    Problems with blood clotting too easily:        Skin    Rashes or ulcers:        Constitutional    Fever or chills:      PHYSICAL EXAM: Vitals:   12/17/19 1143  BP: (!) 152/81  Pulse: 63  Resp: 14  Temp: 97.7 F (36.5 C)  TempSrc: Temporal  SpO2: 96%  Weight: 106 lb 14.4 oz (48.5 kg)  Height: 5\' 4"  (1.626 m)    GENERAL: The patient is a well-nourished female, in no acute distress. The vital signs are documented above. CARDIAC: There is a regular rate and rhythm.  VASCULAR:  Palpable femoral pulses bilaterally Palpable PT pulses bilaterally A number of spider veins as pictures below but not really overt large varicosities PULMONARY: No respiratory distress. ABDOMEN: Soft and non-tender. MUSCULOSKELETAL: There are no major deformities or cyanosis. NEUROLOGIC: No focal weakness or paresthesias are detected. SKIN: There are no ulcers or rashes noted. PSYCHIATRIC: The patient has a normal affect.      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  On the left abnormal reflux in the common femoral vein with again prior ablation/stripping of the great saphenous vein and chronic thrombus in the small saphenous  vein  Assessment/Plan:  79 year old female presents for evaluation of bilateral lower extremity varicose veins.  Turns out she has had bilateral great saphenous vein strippings and/or ablation in the past and all the identifiable reflux on her study today shows abnormal deep reflux bilaterally.  I have recommended getting her back in thigh-high compression with 20 to 30 mmHg to see if this helps some of the leg heaviness that she has at the end of the day.  On exam she also has a number of spider veins that she would like to be treated.  Discussed getting her set up for sclerotherapy here in our office and I introduced her to [place] our sclerotherapy nurse.  Discussed I would not recommend any intervention on the dorsal surface of her feet given significant nerve innervation etc.  She is going to call and schedule sclerotherapy after she looks at her calendar.   Marty Heck, MD [facility] Office: [phone]

## 2019-12-23 DIAGNOSIS — I8393 Asymptomatic varicose veins of bilateral lower extremities: Secondary | ICD-10-CM

## 2019-12-31 ENCOUNTER — Ambulatory Visit (INDEPENDENT_AMBULATORY_CARE_PROVIDER_SITE_OTHER): Payer: Medicare Other | Admitting: Internal Medicine

## 2019-12-31 ENCOUNTER — Encounter: Payer: Self-pay | Admitting: Internal Medicine

## 2019-12-31 ENCOUNTER — Other Ambulatory Visit: Payer: Self-pay

## 2019-12-31 DIAGNOSIS — M810 Age-related osteoporosis without current pathological fracture: Secondary | ICD-10-CM

## 2019-12-31 DIAGNOSIS — J3089 Other allergic rhinitis: Secondary | ICD-10-CM | POA: Diagnosis not present

## 2019-12-31 DIAGNOSIS — I83813 Varicose veins of bilateral lower extremities with pain: Secondary | ICD-10-CM | POA: Diagnosis not present

## 2019-12-31 DIAGNOSIS — E559 Vitamin D deficiency, unspecified: Secondary | ICD-10-CM

## 2019-12-31 NOTE — Progress Notes (Signed)
Subjective:  Patient ID: Tanya Walker, female    DOB: [birth year]  Age: 79 y.o. MRN: [id number]  CC: Follow-up (2 month f/u)   HPI ZOEYA GRAMAJO presents for allergies - better  Outpatient Medications Prior to Visit  Medication Sig Dispense Refill  . cetirizine (ZYRTEC ALLERGY) 10 MG tablet Take 1 tablet (10 mg total) by mouth daily. 30 tablet 11  . Cholecalciferol (VITAMIN D3) 2000 units capsule Take 1 capsule (2,000 Units total) by mouth daily. 100 capsule 3  . estradiol (ESTRACE) 0.1 MG/GM vaginal cream USE 1 GRAM VAGINALLY 2 TIMES A WEEK    . Multiple Vitamins-Minerals (PRESERVISION AREDS 2 PO) Take 1 tablet by mouth 2 (two) times daily.    . Multiple Vitamins-Minerals (PRESERVISION AREDS PO) Take 1 tablet by mouth.    . nystatin-triamcinolone (MYCOLOG II) cream Apply 1 application topically 2 (two) times daily. 30 g 0  . Omega-3 Fatty Acids (FISH OIL OMEGA-3 PO) Take 1 tablet by mouth daily.    Marland Kitchen OVER THE COUNTER MEDICATION Solgar Calcium 600, Vit D 80IU, Mag 300 mg  Take 3 by mouth daily.    . montelukast (SINGULAIR) 10 MG tablet Take 1 tablet (10 mg total) by mouth daily. (Patient not taking: Reported on 12/17/2019) 30 tablet 5  . ciclopirox (PENLAC) 8 % solution Apply topically daily. (Patient not taking: Reported on 12/17/2019)    . Cyanocobalamin (VITAMIN B12 PO) Take 500 mg by mouth daily. (Patient not taking: Reported on 12/31/2019)    . fluticasone (FLONASE) 50 MCG/ACT nasal spray Place 2 sprays into both nostrils daily. (Patient not taking: Reported on 12/17/2019) 16 g 6  . ketoconazole (NIZORAL) 2 % cream Apply topically 2 (two) times daily. (Patient not taking: Reported on 12/17/2019)    . metroNIDAZOLE (METROCREAM) 0.75 % cream APP EXT AA BID (Patient not taking: Reported on 12/17/2019)  3  . OVER THE COUNTER MEDICATION Nature's Bounty D3 2000 IU daily.     No facility-administered medications prior to visit.    ROS: Review of Systems  Objective:  BP 130/70 (BP  Location: Left Arm)   Pulse 62   Temp 98.1 F (36.7 C) (Oral)   Wt 106 lb (48.1 kg)   SpO2 96%   BMI 18.19 kg/m   BP Readings from Last 3 Encounters:  12/31/19 130/70  12/17/19 (!) 152/81  10/28/19 (!) 142/78    Wt Readings from Last 3 Encounters:  12/31/19 106 lb (48.1 kg)  12/17/19 106 lb 14.4 oz (48.5 kg)  10/28/19 106 lb (48.1 kg)    Physical Exam  Lab Results  Component Value Date   WBC 7.6 11/01/2019   HGB 14.9 11/01/2019   HCT 46.1 (H) 11/01/2019   PLT 172 11/01/2019   GLUCOSE 84 11/01/2019   CHOL 201 (H) 11/01/2019   TRIG 52 11/01/2019   HDL 69 11/01/2019   LDLDIRECT 135.4 12/26/2008   LDLCALC 118 (H) 11/01/2019   ALT 14 11/01/2019   AST 20 11/01/2019   NA 140 11/01/2019   K 3.9 11/01/2019   CL 101 11/01/2019   CREATININE 0.69 11/01/2019   BUN 15 11/01/2019   CO2 26 11/01/2019   TSH 1.86 11/01/2019    VAS [place] LOWER EXTREMITY VENOUS REFLUX  Result Date: 12/17/2019  Lower Venous Reflux Study Indications: Varicosities.  Risk Factors: Surgery Bialteral vein stripping in the 1960s. Performing Technologist: Ralene Cork RVT  Examination Guidelines: A complete evaluation includes B-mode imaging, spectral Doppler, color Doppler, and power Doppler as  needed of all accessible portions of each vessel. Bilateral testing is considered an integral part of a complete examination. Limited examinations for reoccurring indications may be performed as noted. The reflux portion of the exam is performed with the patient in reverse Trendelenburg. Significant venous reflux is defined as >500 ms in the superficial venous system, and >1 second in the deep venous system.  Venous Reflux Times +--------------+--------+------+----------+------------+-----------------------+ RIGHT         Reflux  Reflux  Reflux  Diameter cmsComments                              No       Yes     Time                                        +--------------+--------+------+----------+------------+-----------------------+ CFV           no                                                          +--------------+--------+------+----------+------------+-----------------------+ FV mid        no                                                          +--------------+--------+------+----------+------------+-----------------------+ Popliteal              yes  >1 second                                     +--------------+--------+------+----------+------------+-----------------------+ GSV at [facility]    no                         0.361                            +--------------+--------+------+----------+------------+-----------------------+ GSV prox thighno                         0.271                            +--------------+--------+------+----------+------------+-----------------------+ GSV mid thigh                                     prior                                                                     ablation/stripping      +--------------+--------+------+----------+------------+-----------------------+ GSV dist thigh  prior                                                                     ablation/stripping      +--------------+--------+------+----------+------------+-----------------------+ GSV at knee                                       prior                                                                     ablation/stripping      +--------------+--------+------+----------+------------+-----------------------+ GSV prox calf                                     prior                                                                     ablation/stripping      +--------------+--------+------+----------+------------+-----------------------+ SSV Pop Fossa                                     chronic thrombus         +--------------+--------+------+----------+------------+-----------------------+ SSV prox calf                                     chronic thrombus        +--------------+--------+------+----------+------------+-----------------------+ SSV mid calf                                      chronic thrombus        +--------------+--------+------+----------+------------+-----------------------+  +--------------+--------+------+----------+------------+-----------------------+ LEFT          Reflux  Reflux  Reflux  Diameter cmsComments                              No       Yes     Time                                       +--------------+--------+------+----------+------------+-----------------------+ CFV                    yes  >1 second                                     +--------------+--------+------+----------+------------+-----------------------+  FV mid        no                                                          +--------------+--------+------+----------+------------+-----------------------+ Popliteal     no                                                          +--------------+--------+------+----------+------------+-----------------------+ GSV at [facility]                                        prior                                                                     ablation/stripping      +--------------+--------+------+----------+------------+-----------------------+ GSV prox thigh                                    prior                                                                     ablation/stripping      +--------------+--------+------+----------+------------+-----------------------+ GSV mid thigh                                     prior                                                                     ablation/stripping      +--------------+--------+------+----------+------------+-----------------------+ GSV  dist thigh                                    prior                                                                     ablation/stripping      +--------------+--------+------+----------+------------+-----------------------+ GSV at knee  prior                                                                     ablation/stripping      +--------------+--------+------+----------+------------+-----------------------+ GSV prox calf                                     prior                                                                     ablation/stripping      +--------------+--------+------+----------+------------+-----------------------+ GSV mid calf                                      prior                                                                     ablation/stripping      +--------------+--------+------+----------+------------+-----------------------+ SSV Pop Fossa                                     NWV                     +--------------+--------+------+----------+------------+-----------------------+ SSV prox calf                                     NWV                     +--------------+--------+------+----------+------------+-----------------------+ SSV mid calf                                      chronic thrombus        +--------------+--------+------+----------+------------+-----------------------+   Summary: Right: - No evidence of deep vein thrombosis seen in the right lower extremity, from the common femoral through the popliteal veins. 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 - Deep vein reflux is noted in the right popliteal vein. - Prior stripping of the great saphenous vein.  Left: - No evidence of deep vein thrombosis seen in the left lower extremity, from the common femoral through the popliteal veins.  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 - Deep vein reflux  is noted in the right common femoral vein. - Prior stripping of the great saphenous vein.  *See table(s) above for measurements and observations. Electronically signed by Monica Martinez MD on 12/17/2019  at 1:14:44 PM.    Final     Assessment & Plan:   There are no diagnoses linked to this encounter.   No orders of the defined types were placed in this encounter.    Follow-up: No follow-ups on file.  Walker Kehr, MD

## 2019-12-31 NOTE — Assessment & Plan Note (Signed)
Vit D 

## 2019-12-31 NOTE — Assessment & Plan Note (Signed)
Pt declined other meds Vit D, Calcium

## 2019-12-31 NOTE — Assessment & Plan Note (Signed)
Zyrtec, Flonase

## 2019-12-31 NOTE — Assessment & Plan Note (Signed)
Pt saw Dr Carlis Abbott

## 2020-03-24 ENCOUNTER — Other Ambulatory Visit: Payer: Self-pay

## 2020-03-27 ENCOUNTER — Encounter: Payer: Self-pay | Admitting: Internal Medicine

## 2020-03-27 ENCOUNTER — Other Ambulatory Visit: Payer: Self-pay

## 2020-03-27 ENCOUNTER — Ambulatory Visit (INDEPENDENT_AMBULATORY_CARE_PROVIDER_SITE_OTHER): Payer: Medicare Other

## 2020-03-27 ENCOUNTER — Ambulatory Visit (INDEPENDENT_AMBULATORY_CARE_PROVIDER_SITE_OTHER): Payer: Medicare Other | Admitting: Internal Medicine

## 2020-03-27 VITALS — BP 110/64 | HR 74 | Temp 98.1°F | Ht 64.0 in | Wt 106.0 lb

## 2020-03-27 DIAGNOSIS — M545 Low back pain, unspecified: Secondary | ICD-10-CM

## 2020-03-27 MED ORDER — TRAMADOL HCL 50 MG PO TABS
50.0000 mg | ORAL_TABLET | Freq: Four times a day (QID) | ORAL | 0 refills | Status: DC | PRN
Start: 2020-03-27 — End: 2020-05-04

## 2020-03-27 MED ORDER — TIZANIDINE HCL 2 MG PO TABS: 2.0000 mg | ORAL_TABLET | Freq: Four times a day (QID) | ORAL | 1 refills | Status: DC | PRN

## 2020-03-27 NOTE — Assessment & Plan Note (Signed)
Mild to mod, c/w msk spasm most likely, no recent falls but will check films r/o other, but for tramadol prn, tizanidine prn, consider further imaging for worsening s/s

## 2020-03-27 NOTE — Progress Notes (Signed)
Patient ID: Tanya Walker, female   DOB: [birth year], 80 y.o.   MRN: [id number]        Chief Complaint: low back pain       HPI:  Tanya Walker is a 80 y.o. female here with c/o 1 wk onset acute onset chronic recurring low back pain, this time moderate, constant, right > left, started with a spasm like pain with simply twisting at the waist, heat pad and advil has helped, ok to walk but bending forward makes worse.  No worsening speicfic GI or GU symptoms.  Pt denies bowel or bladder change, fever, wt loss,  worsening LE pain/numbness/weakness, gait change or falls. .        Wt Readings from Last 3 Encounters:  03/27/20 106 lb (48.1 kg)  12/31/19 106 lb (48.1 kg)  12/17/19 106 lb 14.4 oz (48.5 kg)   BP Readings from Last 3 Encounters:  03/27/20 110/64  12/31/19 130/70  12/17/19 (!) 152/81         Past Medical History:  Diagnosis Date  . Anxiety    no meds  . Cancer ([place])    skin  . Lipoma of arm    Right  . Medical history non-contributory   . Osteoporosis   . SVD (spontaneous vaginal delivery)    x 2  . Varicose vein   . Vitamin D deficiency    Past Surgical History:  Procedure Laterality Date  . APPENDECTOMY  1947  . CHOLECYSTECTOMY  1988  . GANGLION CYST EXCISION     x2 left and right arm  . HYSTEROSCOPY WITH D & C N/A 07/15/2013   Procedure: DILATATION AND CURETTAGE /HYSTEROSCOPY;  Surgeon: Lovenia Kim, MD;  Location: [facility];  Service: Gynecology;  Laterality: N/A;  . [place]  . WISDOM TOOTH EXTRACTION      reports that she has never smoked. She has never used smokeless tobacco. She reports that she does not drink alcohol and does not use drugs. family history includes Cancer in her father; Heart disease in her father; Hypertension in her mother. Allergies  Allergen Reactions  . Keflex [Cephalexin]   . Alendronate Sodium Other (See Comments)    Leg cramps  . Alendronate Sodium Other (See Comments)    Leg cramps  . Risedronate Sodium      REACTION: ?? achy   Current Outpatient Medications on File Prior to Visit  Medication Sig Dispense Refill  . cetirizine (ZYRTEC ALLERGY) 10 MG tablet Take 1 tablet (10 mg total) by mouth daily. 30 tablet 11  . Cholecalciferol (VITAMIN D3) 2000 units capsule Take 1 capsule (2,000 Units total) by mouth daily. 100 capsule 3  . estradiol (ESTRACE) 0.1 MG/GM vaginal cream USE 1 GRAM VAGINALLY 2 TIMES A WEEK    . Multiple Vitamins-Minerals (PRESERVISION AREDS 2 PO) Take 1 tablet by mouth 2 (two) times daily.    . Multiple Vitamins-Minerals (PRESERVISION AREDS PO) Take 1 tablet by mouth.    . Omega-3 Fatty Acids (FISH OIL OMEGA-3 PO) Take 1 tablet by mouth daily.    Marland Kitchen OVER THE COUNTER MEDICATION Solgar Calcium 600, Vit D 80IU, Mag 300 mg  Take 3 by mouth daily.    . cholecalciferol (VITAMIN D) 25 MCG (1000 UNIT) tablet Vitamin D3    . Multiple Vitamins-Minerals (PRESERVISION AREDS 2+MULTI VIT PO) PreserVision AREDS    . nystatin-triamcinolone (MYCOLOG II) cream Apply 1 application topically 2 (two) times daily. (Patient not taking: Reported on 03/27/2020) 30  g 0  . Omega-3 Fatty Acids (SUPER OMEGA-3) 1000 MG CAPS Super Omega-3     No current facility-administered medications on file prior to visit.        ROS:  All others reviewed and negative.  Objective        PE:  BP 110/64   Pulse 74   Temp 98.1 F (36.7 C) (Oral)   Ht 5\' 4"  (1.626 m)   Wt 106 lb (48.1 kg)   SpO2 97%   BMI 18.19 kg/m                 Constitutional: Pt appears in NAD               HENT: Head: NCAT.                Right Ear: External ear normal.                 Left Ear: External ear normal.                Eyes: . Pupils are equal, round, and reactive to light. Conjunctivae and EOM are normal               Nose: without d/c or deformity               Neck: Neck supple. Gross normal ROM               Cardiovascular: Normal rate and regular rhythm.                 Pulmonary/Chest: Effort normal and breath sounds  without rales or wheezing.                Abd:  Soft, NT, ND, + BS, no organomegaly               Spine with mild scoliosis and right > left bilateral tender spasm low lumbar paravertebral areas               Neurological: Pt is alert. At baseline orientation, motor grossly intact               Skin: Skin is warm. No rashes, no other new lesions, LE edema - none               Psychiatric: Pt behavior is normal without agitation   Micro: none  Cardiac tracings I have personally interpreted today:  none  Pertinent Radiological findings (summarize): none   Lab Results  Component Value Date   WBC 7.6 11/01/2019   HGB 14.9 11/01/2019   HCT 46.1 (H) 11/01/2019   PLT 172 11/01/2019   GLUCOSE 84 11/01/2019   CHOL 201 (H) 11/01/2019   TRIG 52 11/01/2019   HDL 69 11/01/2019   LDLDIRECT 135.4 12/26/2008   LDLCALC 118 (H) 11/01/2019   ALT 14 11/01/2019   AST 20 11/01/2019   NA 140 11/01/2019   K 3.9 11/01/2019   CL 101 11/01/2019   CREATININE 0.69 11/01/2019   BUN 15 11/01/2019   CO2 26 11/01/2019   TSH 1.86 11/01/2019   Assessment/Plan:  Tanya Walker is a 80 y.o. White or Caucasian [1] female with  has a past medical history of Anxiety, Cancer ([place]), Lipoma of arm, Medical history non-contributory, Osteoporosis, SVD (spontaneous vaginal delivery), Varicose vein, and Vitamin D deficiency.  Low back pain Mild to mod, c/w msk spasm most likely, no recent falls but will check films r/o  other, but for tramadol prn, tizanidine prn, consider further imaging for worsening s/s  Followup: Return if symptoms worsen or fail to improve.  [name], MD 03/27/2020 9:49 PM [facility]

## 2020-03-27 NOTE — Patient Instructions (Signed)
Please take all new medication as prescribed - the pain medication and muscle relaxer as needed  Please continue all other medications as before, and refills have been done if requested.  Please have the pharmacy call with any other refills you may need.  Please keep your appointments with your specialists as you may have planned  Please go to the XRAY Department in the first floor for the x-ray testing  You will be contacted by phone if any changes need to be made immediately.  Otherwise, you will receive a letter about your results with an explanation, but please check with MyChart first.  Please remember to sign up for MyChart if you have not done so, as this will be important to you in the future with finding out test results, communicating by private email, and scheduling acute appointments online when needed.  Please consider follow up with Dr [name] in 1 week if not improved

## 2020-03-30 ENCOUNTER — Telehealth: Payer: Self-pay | Admitting: Internal Medicine

## 2020-03-30 NOTE — Telephone Encounter (Signed)
Patient states since she started taking the traMADol (ULTRAM) 50 MG tablet she has been having extreme dry mouth and she has a small rash on her arm that's been itchy. She took it yesterday but has not taken any today. She is wondering if she can get switched to a different medication. [phone]

## 2020-03-31 ENCOUNTER — Telehealth: Payer: Self-pay | Admitting: Internal Medicine

## 2020-03-31 ENCOUNTER — Encounter: Payer: Self-pay | Admitting: Internal Medicine

## 2020-03-31 DIAGNOSIS — S22080D Wedge compression fracture of T11-T12 vertebra, subsequent encounter for fracture with routine healing: Secondary | ICD-10-CM

## 2020-03-31 NOTE — Telephone Encounter (Signed)
Please schedule an appointment to see me or one of my partners.  It may or may not be the tramadol.  She can try to take a half or quarter of a tablet.  Use Tylenol.  Thanks

## 2020-03-31 NOTE — Telephone Encounter (Signed)
Notified pt w/MD response. Pt states she will do half and see how that works if sxs continue will f/u w/MD..Tanya Walker

## 2020-04-01 NOTE — Telephone Encounter (Signed)
Patient called and is requesting a call back in regards to recent x-ray results and why she is needing an MRI. Please advise. She can be reached at [phone]

## 2020-04-02 NOTE — Telephone Encounter (Signed)
Patient is requesting a call back at [phone]

## 2020-04-02 NOTE — Telephone Encounter (Signed)
Per chart pt saw Dr. [name] Reichmann who ordered an MRI. Called pt verified if ahe received results from xray which she said she have not. Gave her results from xray, and inform that's why the MRI was order to look further into when fracture happen. Pt states she would like to see if the MRI can be done sooner.inform her I will forward the msg to the [facility] who make specialist appt and have them to give her a call.Marland KitchenJohny Chess

## 2020-04-02 NOTE — Telephone Encounter (Signed)
Patient called and said she can not get her MRI until 3.12.22.

## 2020-04-07 ENCOUNTER — Other Ambulatory Visit: Payer: Self-pay

## 2020-04-07 ENCOUNTER — Ambulatory Visit
Admission: RE | Admit: 2020-04-07 | Discharge: 2020-04-07 | Disposition: A | Discharge status: Home / Self Care | Payer: Medicare Other | Source: Ambulatory Visit | Attending: Internal Medicine | Admitting: Internal Medicine

## 2020-04-07 DIAGNOSIS — S22080D Wedge compression fracture of T11-T12 vertebra, subsequent encounter for fracture with routine healing: Secondary | ICD-10-CM

## 2020-04-07 DIAGNOSIS — S22080A Wedge compression fracture of T11-T12 vertebra, initial encounter for closed fracture: Secondary | ICD-10-CM | POA: Diagnosis not present

## 2020-04-07 DIAGNOSIS — Z8739 Personal history of other diseases of the musculoskeletal system and connective tissue: Secondary | ICD-10-CM | POA: Diagnosis not present

## 2020-04-08 ENCOUNTER — Other Ambulatory Visit: Payer: Self-pay | Admitting: Internal Medicine

## 2020-04-08 ENCOUNTER — Encounter: Payer: Self-pay | Admitting: Internal Medicine

## 2020-04-08 DIAGNOSIS — S22080D Wedge compression fracture of T11-T12 vertebra, subsequent encounter for fracture with routine healing: Secondary | ICD-10-CM

## 2020-04-13 ENCOUNTER — Other Ambulatory Visit: Payer: Self-pay | Admitting: Neurosurgery

## 2020-04-13 DIAGNOSIS — S22080A Wedge compression fracture of T11-T12 vertebra, initial encounter for closed fracture: Secondary | ICD-10-CM | POA: Diagnosis not present

## 2020-04-13 DIAGNOSIS — R030 Elevated blood-pressure reading, without diagnosis of hypertension: Secondary | ICD-10-CM | POA: Diagnosis not present

## 2020-04-15 ENCOUNTER — Other Ambulatory Visit: Payer: Self-pay | Admitting: Neurosurgery

## 2020-04-16 ENCOUNTER — Ambulatory Visit
Admission: RE | Admit: 2020-04-16 | Discharge: 2020-04-16 | Disposition: A | Discharge status: Home / Self Care | Payer: Medicare Other | Source: Ambulatory Visit | Attending: Neurosurgery | Admitting: Neurosurgery

## 2020-04-16 DIAGNOSIS — J984 Other disorders of lung: Secondary | ICD-10-CM | POA: Diagnosis not present

## 2020-04-16 DIAGNOSIS — S22080A Wedge compression fracture of T11-T12 vertebra, initial encounter for closed fracture: Secondary | ICD-10-CM

## 2020-04-16 DIAGNOSIS — S22081A Stable burst fracture of T11-T12 vertebra, initial encounter for closed fracture: Secondary | ICD-10-CM | POA: Diagnosis not present

## 2020-04-16 DIAGNOSIS — M47814 Spondylosis without myelopathy or radiculopathy, thoracic region: Secondary | ICD-10-CM | POA: Diagnosis not present

## 2020-04-16 DIAGNOSIS — I700 Atherosclerosis of aorta: Secondary | ICD-10-CM | POA: Diagnosis not present

## 2020-04-18 ENCOUNTER — Other Ambulatory Visit: Payer: Medicare Other

## 2020-04-21 ENCOUNTER — Other Ambulatory Visit: Payer: Self-pay | Admitting: Internal Medicine

## 2020-04-30 ENCOUNTER — Other Ambulatory Visit (HOSPITAL_COMMUNITY)
Admission: RE | Admit: 2020-04-30 | Discharge: 2020-04-30 | Disposition: A | Discharge status: Home / Self Care | Payer: Medicare Other | Source: Ambulatory Visit | Attending: Neurosurgery | Admitting: Neurosurgery

## 2020-04-30 DIAGNOSIS — Z20822 Contact with and (suspected) exposure to covid-19: Secondary | ICD-10-CM | POA: Insufficient documentation

## 2020-04-30 DIAGNOSIS — Z01812 Encounter for preprocedural laboratory examination: Secondary | ICD-10-CM | POA: Insufficient documentation

## 2020-04-30 LAB — SARS CORONAVIRUS 2 (TAT 6-24 HRS): SARS Coronavirus 2: NEGATIVE

## 2020-05-01 ENCOUNTER — Encounter (HOSPITAL_COMMUNITY): Payer: Self-pay | Admitting: *Deleted

## 2020-05-01 NOTE — Progress Notes (Signed)
Spoke with pt for pre-op call. Pt denies cardiac history, HTN or Diabetes. Pt's PCP is Dr. [name].  Covid test done 04/30/20 and it's negative. Pt states she's been in quarantine since the test was done and understands that she stays in quarantine until she comes to the hospital on Monday.  Patient will be arriving via [facility] transportation. Friend of patient will pick her up and husband will be with her once she gets home

## 2020-05-03 NOTE — Anesthesia Preprocedure Evaluation (Addendum)
Anesthesia Evaluation  Patient identified by MRN, date of birth, ID band Patient awake    Reviewed: Allergy & Precautions, NPO status , Patient's Chart, lab work & pertinent test results  Airway Mallampati: II  TM Distance: >3 FB Neck ROM: Full    Dental  (+) Dental Advisory Given   Pulmonary neg pulmonary ROS,    breath sounds clear to auscultation       Cardiovascular negative cardio ROS   Rhythm:Regular Rate:Normal     Neuro/Psych  Headaches,    GI/Hepatic negative GI ROS, Neg liver ROS,   Endo/Other  negative endocrine ROS  Renal/GU negative Renal ROS     Musculoskeletal   Abdominal   Peds  Hematology negative hematology ROS (+)   Anesthesia Other Findings   Reproductive/Obstetrics                            Lab Results  Component Value Date   WBC 6.5 05/04/2020   HGB 12.9 05/04/2020   HCT 39.5 05/04/2020   MCV 94.5 05/04/2020   PLT 227 05/04/2020   Lab Results  Component Value Date   CREATININE 0.69 11/01/2019   BUN 15 11/01/2019   NA 140 11/01/2019   K 3.9 11/01/2019   CL 101 11/01/2019   CO2 26 11/01/2019    Anesthesia Physical Anesthesia Plan  ASA: II  Anesthesia Plan: General   Post-op Pain Management:    Induction: Intravenous  PONV Risk Score and Plan: 3 and Ondansetron, Dexamethasone, Treatment may vary due to age or medical condition and Propofol infusion  Airway Management Planned: Oral ETT  Additional Equipment:   Intra-op Plan:   Post-operative Plan: Extubation in OR  Informed Consent: I have reviewed the patients History and Physical, chart, labs and discussed the procedure including the risks, benefits and alternatives for the proposed anesthesia with the patient or authorized representative who has indicated his/her understanding and acceptance.     Dental advisory given  Plan Discussed with: CRNA  Anesthesia Plan Comments:         Anesthesia Quick Evaluation

## 2020-05-04 ENCOUNTER — Encounter (HOSPITAL_COMMUNITY)
Admission: RE | Disposition: A | Discharge status: Home / Self Care | Payer: Self-pay | Source: Home / Self Care | Attending: Neurosurgery

## 2020-05-04 ENCOUNTER — Ambulatory Visit (HOSPITAL_COMMUNITY): Payer: Medicare Other | Admitting: Anesthesiology

## 2020-05-04 ENCOUNTER — Ambulatory Visit (HOSPITAL_COMMUNITY): Payer: Medicare Other

## 2020-05-04 ENCOUNTER — Ambulatory Visit (HOSPITAL_COMMUNITY)
Admission: RE | Admit: 2020-05-04 | Discharge: 2020-05-04 | Disposition: A | Discharge status: Home / Self Care | Payer: Medicare Other | Attending: Neurosurgery | Admitting: Neurosurgery

## 2020-05-04 ENCOUNTER — Encounter (HOSPITAL_COMMUNITY): Payer: Self-pay

## 2020-05-04 ENCOUNTER — Other Ambulatory Visit: Payer: Self-pay

## 2020-05-04 DIAGNOSIS — E559 Vitamin D deficiency, unspecified: Secondary | ICD-10-CM | POA: Diagnosis not present

## 2020-05-04 DIAGNOSIS — M8088XA Other osteoporosis with current pathological fracture, vertebra(e), initial encounter for fracture: Secondary | ICD-10-CM | POA: Insufficient documentation

## 2020-05-04 DIAGNOSIS — Z888 Allergy status to other drugs, medicaments and biological substances status: Secondary | ICD-10-CM | POA: Diagnosis not present

## 2020-05-04 DIAGNOSIS — Z809 Family history of malignant neoplasm, unspecified: Secondary | ICD-10-CM | POA: Diagnosis not present

## 2020-05-04 DIAGNOSIS — Z8249 Family history of ischemic heart disease and other diseases of the circulatory system: Secondary | ICD-10-CM | POA: Insufficient documentation

## 2020-05-04 DIAGNOSIS — Z79899 Other long term (current) drug therapy: Secondary | ICD-10-CM | POA: Insufficient documentation

## 2020-05-04 DIAGNOSIS — Z85828 Personal history of other malignant neoplasm of skin: Secondary | ICD-10-CM | POA: Insufficient documentation

## 2020-05-04 DIAGNOSIS — Z881 Allergy status to other antibiotic agents status: Secondary | ICD-10-CM | POA: Diagnosis not present

## 2020-05-04 DIAGNOSIS — J309 Allergic rhinitis, unspecified: Secondary | ICD-10-CM | POA: Diagnosis not present

## 2020-05-04 DIAGNOSIS — M8008XA Age-related osteoporosis with current pathological fracture, vertebra(e), initial encounter for fracture: Secondary | ICD-10-CM | POA: Diagnosis not present

## 2020-05-04 DIAGNOSIS — F418 Other specified anxiety disorders: Secondary | ICD-10-CM | POA: Diagnosis not present

## 2020-05-04 DIAGNOSIS — M40204 Unspecified kyphosis, thoracic region: Secondary | ICD-10-CM | POA: Insufficient documentation

## 2020-05-04 DIAGNOSIS — S22089A Unspecified fracture of T11-T12 vertebra, initial encounter for closed fracture: Secondary | ICD-10-CM | POA: Diagnosis not present

## 2020-05-04 DIAGNOSIS — Z419 Encounter for procedure for purposes other than remedying health state, unspecified: Secondary | ICD-10-CM

## 2020-05-04 HISTORY — PX: KYPHOPLASTY: SHX5884

## 2020-05-04 HISTORY — DX: Depression, unspecified: F32.A

## 2020-05-04 HISTORY — DX: Headache, unspecified: R51.9

## 2020-05-04 LAB — CBC
HCT: 39.5 % (ref 36.0–46.0)
Hemoglobin: 12.9 g/dL (ref 12.0–15.0)
MCH: 30.9 pg (ref 26.0–34.0)
MCHC: 32.7 g/dL (ref 30.0–36.0)
MCV: 94.5 fL (ref 80.0–100.0)
Platelets: 227 10*3/uL (ref 150–400)
RBC: 4.18 MIL/uL (ref 3.87–5.11)
RDW: 13.7 % (ref 11.5–15.5)
WBC: 6.5 10*3/uL (ref 4.0–10.5)
nRBC: 0 % (ref 0.0–0.2)

## 2020-05-04 LAB — SURGICAL PCR SCREEN
MRSA, PCR: NEGATIVE
Staphylococcus aureus: NEGATIVE

## 2020-05-04 SURGERY — KYPHOPLASTY
Anesthesia: General | Site: Spine Thoracic | Laterality: Bilateral

## 2020-05-04 MED ORDER — LIDOCAINE 2% (20 MG/ML) 5 ML SYRINGE
INTRAMUSCULAR | Status: DC | PRN
  Administered 2020-05-04: 40 mg via INTRAVENOUS

## 2020-05-04 MED ORDER — CHLORHEXIDINE GLUCONATE CLOTH 2 % EX PADS: 6.0000 | MEDICATED_PAD | Freq: Once | CUTANEOUS | Status: DC

## 2020-05-04 MED ORDER — SUGAMMADEX SODIUM 200 MG/2ML IV SOLN
INTRAVENOUS | Status: DC | PRN
  Administered 2020-05-04: 100 mg via INTRAVENOUS

## 2020-05-04 MED ORDER — PROPOFOL 10 MG/ML IV BOLUS
INTRAVENOUS | Status: DC | PRN
  Administered 2020-05-04: 60 mg via INTRAVENOUS

## 2020-05-04 MED ORDER — ACETAMINOPHEN 500 MG PO TABS
1000.0000 mg | ORAL_TABLET | Freq: Once | ORAL | Status: AC
  Administered 2020-05-04: 1000 mg via ORAL
  Filled 2020-05-04: qty 2

## 2020-05-04 MED ORDER — TRAMADOL HCL 50 MG PO TABS
50.0000 mg | ORAL_TABLET | Freq: Four times a day (QID) | ORAL | 0 refills | Status: DC | PRN
Start: 2020-05-04 — End: 2020-05-21

## 2020-05-04 MED ORDER — FENTANYL CITRATE (PF) 250 MCG/5ML IJ SOLN
INTRAMUSCULAR | Status: AC
  Filled 2020-05-04: qty 5

## 2020-05-04 MED ORDER — PHENYLEPHRINE HCL-NACL 10-0.9 MG/250ML-% IV SOLN
INTRAVENOUS | Status: DC | PRN
  Administered 2020-05-04: 40 ug/min via INTRAVENOUS

## 2020-05-04 MED ORDER — FENTANYL CITRATE (PF) 250 MCG/5ML IJ SOLN
INTRAMUSCULAR | Status: DC | PRN
  Administered 2020-05-04: 50 ug via INTRAVENOUS

## 2020-05-04 MED ORDER — PHENYLEPHRINE 40 MCG/ML (10ML) SYRINGE FOR IV PUSH (FOR BLOOD PRESSURE SUPPORT)
PREFILLED_SYRINGE | INTRAVENOUS | Status: DC | PRN
  Administered 2020-05-04: 80 ug via INTRAVENOUS

## 2020-05-04 MED ORDER — ROCURONIUM BROMIDE 10 MG/ML (PF) SYRINGE
PREFILLED_SYRINGE | INTRAVENOUS | Status: DC | PRN
  Administered 2020-05-04: 30 mg via INTRAVENOUS
  Administered 2020-05-04 (×2): 10 mg via INTRAVENOUS

## 2020-05-04 MED ORDER — 0.9 % SODIUM CHLORIDE (POUR BTL) OPTIME
TOPICAL | Status: DC | PRN
  Administered 2020-05-04: 1000 mL

## 2020-05-04 MED ORDER — EPHEDRINE SULFATE-NACL 50-0.9 MG/10ML-% IV SOSY
PREFILLED_SYRINGE | INTRAVENOUS | Status: DC | PRN
  Administered 2020-05-04: 5 mg via INTRAVENOUS

## 2020-05-04 MED ORDER — PROPOFOL 10 MG/ML IV BOLUS
INTRAVENOUS | Status: AC
  Filled 2020-05-04: qty 20

## 2020-05-04 MED ORDER — LACTATED RINGERS IV SOLN: INTRAVENOUS | Status: DC

## 2020-05-04 MED ORDER — VANCOMYCIN HCL IN DEXTROSE 1-5 GM/200ML-% IV SOLN
1000.0000 mg | INTRAVENOUS | Status: AC
  Administered 2020-05-04: 1000 mg via INTRAVENOUS
  Filled 2020-05-04: qty 200

## 2020-05-04 MED ORDER — LIDOCAINE-EPINEPHRINE 1 %-1:100000 IJ SOLN
INTRAMUSCULAR | Status: DC | PRN
  Administered 2020-05-04: 6 mL

## 2020-05-04 MED ORDER — LIDOCAINE-EPINEPHRINE 1 %-1:100000 IJ SOLN
INTRAMUSCULAR | Status: AC
  Filled 2020-05-04: qty 1

## 2020-05-04 MED ORDER — DEXAMETHASONE SODIUM PHOSPHATE 10 MG/ML IJ SOLN
INTRAMUSCULAR | Status: DC | PRN
  Administered 2020-05-04: 10 mg via INTRAVENOUS

## 2020-05-04 MED ORDER — ORAL CARE MOUTH RINSE: 15.0000 mL | Freq: Once | OROMUCOSAL | Status: AC

## 2020-05-04 MED ORDER — BUPIVACAINE HCL (PF) 0.5 % IJ SOLN
INTRAMUSCULAR | Status: AC
  Filled 2020-05-04: qty 30

## 2020-05-04 MED ORDER — ONDANSETRON HCL 4 MG/2ML IJ SOLN
INTRAMUSCULAR | Status: DC | PRN
  Administered 2020-05-04: 4 mg via INTRAVENOUS

## 2020-05-04 MED ORDER — PHENYLEPHRINE 40 MCG/ML (10ML) SYRINGE FOR IV PUSH (FOR BLOOD PRESSURE SUPPORT)
PREFILLED_SYRINGE | INTRAVENOUS | Status: AC
  Filled 2020-05-04: qty 10

## 2020-05-04 MED ORDER — FENTANYL CITRATE (PF) 100 MCG/2ML IJ SOLN: 25.0000 ug | INTRAMUSCULAR | Status: DC | PRN

## 2020-05-04 MED ORDER — DEXAMETHASONE SODIUM PHOSPHATE 10 MG/ML IJ SOLN
INTRAMUSCULAR | Status: AC
  Filled 2020-05-04: qty 1

## 2020-05-04 MED ORDER — ONDANSETRON HCL 4 MG/2ML IJ SOLN
INTRAMUSCULAR | Status: AC
  Filled 2020-05-04: qty 2

## 2020-05-04 MED ORDER — LIDOCAINE 2% (20 MG/ML) 5 ML SYRINGE
INTRAMUSCULAR | Status: AC
  Filled 2020-05-04: qty 5

## 2020-05-04 MED ORDER — ROCURONIUM BROMIDE 10 MG/ML (PF) SYRINGE
PREFILLED_SYRINGE | INTRAVENOUS | Status: AC
  Filled 2020-05-04: qty 10

## 2020-05-04 MED ORDER — CHLORHEXIDINE GLUCONATE 0.12 % MT SOLN
15.0000 mL | Freq: Once | OROMUCOSAL | Status: AC
  Administered 2020-05-04: 15 mL via OROMUCOSAL
  Filled 2020-05-04: qty 15

## 2020-05-04 MED ORDER — AMISULPRIDE (ANTIEMETIC) 5 MG/2ML IV SOLN: 5.0000 mg | Freq: Once | INTRAVENOUS | Status: DC | PRN

## 2020-05-04 MED ORDER — EPHEDRINE 5 MG/ML INJ
INTRAVENOUS | Status: AC
  Filled 2020-05-04: qty 10

## 2020-05-04 SURGICAL SUPPLY — 37 items
ADH SKN CLS APL DERMABOND .7 (GAUZE/BANDAGES/DRESSINGS) ×1
CANNULA 10G ACCESS (KITS) ×2 IMPLANT
CEMENT HV W/AUTOPLEX MIXER (Cement) ×2 IMPLANT
CEMENT VERTAPLEX W/MIXER 20G (Cement) ×2 IMPLANT
DERMABOND ADVANCED (GAUZE/BANDAGES/DRESSINGS) ×2
DERMABOND ADVANCED .7 DNX12 (GAUZE/BANDAGES/DRESSINGS) IMPLANT
DRAPE C-ARM 42X72 X-RAY (DRAPES) ×3 IMPLANT
DRAPE LAPAROTOMY 100X72X124 (DRAPES) ×3 IMPLANT
DRAPE WARM FLUID 44X44 (DRAPES) ×1 IMPLANT
DRSG OPSITE POSTOP 3X4 (GAUZE/BANDAGES/DRESSINGS) ×2 IMPLANT
DURAPREP 26ML APPLICATOR (WOUND CARE) ×3 IMPLANT
GAUZE 4X4 16PLY RFD (DISPOSABLE) ×2 IMPLANT
GLOVE BIOGEL PI IND STRL 7.5 (GLOVE) ×1 IMPLANT
GLOVE BIOGEL PI INDICATOR 7.5 (GLOVE) ×4
GLOVE ECLIPSE 7.5 STRL STRAW (GLOVE) ×5 IMPLANT
GLOVE SS BIOGEL STRL SZ 7.5 (GLOVE) IMPLANT
GLOVE SUPERSENSE BIOGEL SZ 7.5 (GLOVE) ×4
GOWN STRL REUS W/ TWL LRG LVL3 (GOWN DISPOSABLE) IMPLANT
GOWN STRL REUS W/ TWL XL LVL3 (GOWN DISPOSABLE) ×1 IMPLANT
GOWN STRL REUS W/TWL 2XL LVL3 (GOWN DISPOSABLE) IMPLANT
GOWN STRL REUS W/TWL LRG LVL3 (GOWN DISPOSABLE) ×6
GOWN STRL REUS W/TWL XL LVL3 (GOWN DISPOSABLE) ×6
KIT BASIN OR (CUSTOM PROCEDURE TRAY) ×3 IMPLANT
KIT SPINEJACK CASE 5.0 (KITS) ×2 IMPLANT
KIT SPINEJACK EXPANSION 5.0 (KITS) ×1 IMPLANT
KIT SPINEJACK PREPARATION 5.0 (KITS) ×1 IMPLANT
KIT TURNOVER KIT B (KITS) ×3 IMPLANT
MARKER SKIN DUAL TIP RULER LAB (MISCELLANEOUS) ×3 IMPLANT
NEEDLE HYPO 22GX1.5 SAFETY (NEEDLE) ×3 IMPLANT
NS IRRIG 1000ML POUR BTL (IV SOLUTION) ×3 IMPLANT
PACK LAMINECTOMY NEURO (CUSTOM PROCEDURE TRAY) ×3 IMPLANT
PUSHER CEMENT 5.0-5.8 (MISCELLANEOUS) ×4 IMPLANT
SUT VICRYL RAPIDE 4/0 PS 2 (SUTURE) ×4 IMPLANT
TOWEL GREEN STERILE (TOWEL DISPOSABLE) ×3 IMPLANT
TOWEL GREEN STERILE FF (TOWEL DISPOSABLE) ×3 IMPLANT
WATER STERILE IRR 1000ML POUR (IV SOLUTION) ×3 IMPLANT
autoplex system without needles IMPLANT

## 2020-05-04 NOTE — Transfer of Care (Signed)
Immediate Anesthesia Transfer of Care Note  Patient: Tanya Walker  Procedure(s) Performed: KYPHOPLASTY THORACIC TWELVE (Bilateral Spine Thoracic)  Patient Location: PACU  Anesthesia Type:General  Level of Consciousness: awake and alert   Airway & Oxygen Therapy: Patient Spontanous Breathing and Patient connected to face mask oxygen  Post-op Assessment: Report given to RN, Post -op Vital signs reviewed and stable, Patient moving all extremities X 4 and Patient able to stick tongue midline  Post vital signs: Reviewed and stable  Last Vitals:  Vitals Value Taken Time  BP 168/78 05/04/20 0924  Temp    Pulse 81 05/04/20 0928  Resp 25 05/04/20 0928  SpO2 100 % 05/04/20 0928  Vitals shown include unvalidated device data.  Last Pain:  Vitals:   05/04/20 0701  TempSrc:   PainSc: 5       Patients Stated Pain Goal: 3 (15/04/13 6438)  Complications: No complications documented.

## 2020-05-04 NOTE — Op Note (Signed)
PREOP DIAGNOSIS: T12 compression fx   POSTOP DIAGNOSIS: T12 compression fx   PROCEDURE: 1. T12 bilateral vertebral body stenting with SpineJack and vertebroplasty  SURGEON: Dr. Duffy Rhody, MD  ASSISTANT: None  ANESTHESIA: General  EBL: Minimal  SPECIMENS: None  DRAINS: None  COMPLICATIONS: None immediate  CONDITION: Hemodynamically stable to PCAU  HISTORY: Tanya Walker is a 80 y.o. yo female with osteoporosis who developed a T12 compression fracture in early February.  Her mechanical back pain persisted despite maximal nonsurgical therapies and medical treatment.  As such, I discussed with her the option of vertebral augmentation.  Given the severe loss of height and kyphosis, height restoration would be best accomplished with vertebral body stenting.  I discussed with her risks, benefits, alternatives, and expected convalescence.  Risks discussed with her included, but were not limited to, bleeding, pain, infection, cement extravasation, embolism, adjacent body fractures, recurrent fracture, neurologic deficit, and death.  Informed consent was obtained.   PROCEDURE IN DETAIL: After informed consent was obtained and witnessed, the patient was brought to the operating room. After induction of anesthesia, the patient was positioned on the operative table in the prone position. All pressure points were meticulously padded. Fluoroscopy was used to mark out the projection of the T12 pedicles on the skin. Skin incision was then marked out and prepped and draped in the usual sterile fashion.  After time out was conducted, bilateral stab incisions were made and Jamshidi needles were introduced. Under AP and lateral fluoroscopic guidance, bilateral T12pedicles were canulated.  A stiff stylette was then placed through the Jamshidi cannula and the Jamshidi's were removed.  Cannula and reamer were then used to drill the bilateral pedicles and body to create a space for the stenting device.   The resulting cavity was then tamped.  Medium sized spine jack devices were then placed through the cannulas and were in appropriate place by AP and lateral projection.  They were 6 sequentially expanded in the craniocaudal direction under AP and lateral fluoroscopic control.  There was good height restoration noted.  When there appeared to be fairly successful height reduction, cement was then placed into the cavities created by the vertebral stenting, with intercalation through the body across the midline.  No extravasation was seen and the cement remained in the fractured vertebral body.  The cannulas were then removed.  Stab incisions were then closed with 3-0 vicryl suture and standard skin glue. The patient was then transferred to the stretcher and taken to the PACU in stable hemodynamic condition.  At the end of the case all sponge, needle, and instrument counts were correct.

## 2020-05-04 NOTE — H&P (Signed)
CC: back pain  HPI:     Patient is a 80 y.o. female with osteoporosis who developed acute severe mid back pain.  She was found to have a severe compression fracture at T12.  PT and pain meds did not significantly improve her pain, which remained persistent after 6 weeks.  She is here for elective vertebroplasty/vertebral stenting.    Patient Active Problem List   Diagnosis Date Noted  . Low back pain 03/27/2020  . 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 Pain of left hand 02/14/2019  . Tibialis posterior tendinitis, right 07/08/2016  . Ankle sprain 06/30/2016  . Peripheral vascular disease ([place]) 04/03/2016  . Ankle pain, left 03/31/2016  . Sore in nose 03/31/2016  . Well adult exam 09/25/2015  . Adjustment disorder with mixed anxiety and depressed mood 02/03/2015  . Allergic rhinitis 05/29/2014  . Rash 06/10/2013  . URI (upper respiratory infection) 03/08/2012  . Lipoma 11/04/2009  . PARESTHESIA 12/26/2008  . Acute maxillary sinusitis 11/22/2007  . Weight loss, abnormal 08/24/2007  . Vitamin D deficiency 02/14/2007  . 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 Osteoporosis 09/01/2006   Past Medical History:  Diagnosis Date  . Anxiety    no meds  . 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 Depression    years ago  . Headache    sinus headaches  . Lipoma of arm    Right  . Medical history non-contributory   . Osteoporosis   . SVD (spontaneous vaginal delivery)    x 2  . Varicose vein   . Vitamin D deficiency     Past Surgical History:  Procedure Laterality Date  . APPENDECTOMY  1947  . CHOLECYSTECTOMY  1988  . COLONOSCOPY    . GANGLION CYST EXCISION     x2 left and right arm  . HYSTEROSCOPY WITH D & C N/A 07/15/2013   Procedure: DILATATION AND CURETTAGE /HYSTEROSCOPY;  Surgeon: Lovenia Kim, MD;  Location: [facility];  Service: Gynecology;  Laterality: N/A;  . [place]  . WISDOM TOOTH EXTRACTION      Medications Prior to Admission  Medication Sig  Dispense Refill Last Dose  . acetaminophen (TYLENOL) 500 MG tablet Take 1,000 mg by mouth every 6 (six) hours as needed for moderate pain.   05/03/2020 at Unknown time  . Cholecalciferol (VITAMIN D3) 2000 units capsule Take 1 capsule (2,000 Units total) by mouth daily. (Patient taking differently: Take 2,000 Units by mouth in the morning and at bedtime.) 100 capsule 3 Past Week at Unknown time  . Multiple Vitamins-Minerals (PRESERVISION AREDS 2 PO) Take 1 tablet by mouth 2 (two) times daily.   Past Week at Unknown time  . Omega-3 Fatty Acids (SUPER OMEGA-3) 1000 MG CAPS Super Omega-3   Past Week at Unknown time  . tiZANidine (ZANAFLEX) 2 MG tablet TAKE 1 TABLET(2 MG) BY MOUTH EVERY 6 HOURS AS NEEDED FOR MUSCLE SPASMS (Patient taking differently: Take 2 mg by mouth every 6 (six) hours as needed for muscle spasms.) 40 tablet 1 05/03/2020 at Unknown time  . vitamin C (ASCORBIC ACID) 500 MG tablet Take 500 mg by mouth 2 (two) times daily.   Past Week at Unknown time  . cetirizine (ZYRTEC ALLERGY) 10 MG tablet Take 1 tablet (10 mg total) by mouth daily. (Patient not taking: No sig reported) 30 tablet 11 Not Taking at Unknown time  . Multiple Vitamins-Minerals (PRESERVISION AREDS 2+MULTI VIT PO) PreserVision AREDS (  Patient not taking: No sig reported)   Not Taking at Unknown time  . Multiple Vitamins-Minerals (PRESERVISION AREDS PO) Take 1 tablet by mouth. (Patient not taking: Reported on 04/23/2020)   Not Taking at Unknown time  . nystatin-triamcinolone (MYCOLOG II) cream Apply 1 application topically 2 (two) times daily. 30 g 0   . traMADol (ULTRAM) 50 MG tablet Take 1 tablet (50 mg total) by mouth every 6 (six) hours as needed. (Patient not taking: No sig reported) 30 tablet 0 Not Taking at Unknown time   Allergies  Allergen Reactions  . Keflex [Cephalexin]   . Alendronate Sodium Other (See Comments)    Leg cramps  . Alendronate Sodium Other (See Comments)    Leg cramps  . Risedronate Sodium      REACTION: ?? achy    Social History   Tobacco Use  . Smoking status: Never Smoker  . Smokeless tobacco: Never Used  Substance Use Topics  . Alcohol use: No    Family History  Problem Relation Age of Onset  . Hypertension Mother   . Heart disease Father   . Cancer Father      Review of Systems Pertinent items noted in HPI and remainder of comprehensive ROS otherwise negative.  Objective:   Patient Vitals for the past 8 hrs:  BP Temp Temp src Pulse Resp SpO2 Height Weight  05/04/20 0614 (!) 167/87 98.1 F (36.7 C) Oral 75 17 99 % 5\' 4"  (1.626 m) 46.3 kg   I/O last 3 completed shifts: In: 200 [IV Piggyback:200] Out: -  No intake/output data recorded.      General : Alert, cooperative, no distress, appears stated age   Head:  Normocephalic/atraumatic    Eyes: PERRL, conjunctiva/corneas clear, EOM's intact. Fundi could not be visualized Neck: Supple Chest:  Respirations unlabored Chest wall: no tenderness or deformity Heart: Regular rate and rhythm Abdomen: Soft, nontender and nondistended Extremities: warm and well-perfused Skin: normal turgor, color and texture Neurologic:  Alert, oriented x 3.  Eyes open spontaneously. PERRL, EOMI, VFC, no facial droop. V1-3 intact.  No dysarthria, tongue protrusion symmetric.  CNII-XII intact. Normal strength, sensation and reflexes throughout.  No pronator drift, full strength in legs       Data Review  Imaging shows a T12 compression fracture with severe loss of height, focal kyphosis, and mild stenosis,  Assessment:   T12 compression fracture  Plan:   - spineJack at T12 today

## 2020-05-04 NOTE — Anesthesia Postprocedure Evaluation (Signed)
Anesthesia Post Note  Patient: Tanya Walker  Procedure(s) Performed: KYPHOPLASTY THORACIC TWELVE (Bilateral Spine Thoracic)     Patient location during evaluation: PACU Anesthesia Type: General Level of consciousness: awake and alert Pain management: pain level controlled Vital Signs Assessment: post-procedure vital signs reviewed and stable Respiratory status: spontaneous breathing, nonlabored ventilation, respiratory function stable and patient connected to nasal cannula oxygen Cardiovascular status: blood pressure returned to baseline and stable Postop Assessment: no apparent nausea or vomiting Anesthetic complications: no   No complications documented.  Last Vitals:  Vitals:   05/04/20 0940 05/04/20 0952  BP: (!) 164/91 (!) 149/79  Pulse: 84 77  Resp: 20 17  Temp:    SpO2: 98% 97%    Last Pain:  Vitals:   05/04/20 0940  TempSrc:   PainSc: 0-No pain                 Tiajuana Amass

## 2020-05-04 NOTE — Anesthesia Procedure Notes (Addendum)
Procedure Name: Intubation Date/Time: 05/04/2020 7:57 AM Performed by: Reece Agar, CRNA Pre-anesthesia Checklist: Patient identified, Emergency Drugs available, Suction available and Patient being monitored Patient Re-evaluated:Patient Re-evaluated prior to induction Oxygen Delivery Method: Circle system utilized Preoxygenation: Pre-oxygenation with 100% oxygen Induction Type: IV induction Ventilation: Mask ventilation without difficulty Laryngoscope Size: Mac and 3 Grade View: Grade I Tube type: Oral Tube size: 7.0 mm Number of attempts: 1 Airway Equipment and Method: Stylet Placement Confirmation: ETT inserted through vocal cords under direct vision,  positive ETCO2 and breath sounds checked- equal and bilateral Secured at: 21 cm Tube secured with: Tape Dental Injury: Teeth and Oropharynx as per pre-operative assessment

## 2020-05-08 ENCOUNTER — Encounter (HOSPITAL_COMMUNITY): Payer: Self-pay | Admitting: Neurosurgery

## 2020-05-13 ENCOUNTER — Encounter (HOSPITAL_COMMUNITY): Payer: Self-pay | Admitting: Neurosurgery

## 2020-05-21 ENCOUNTER — Encounter: Payer: Self-pay | Admitting: Internal Medicine

## 2020-05-21 ENCOUNTER — Ambulatory Visit (INDEPENDENT_AMBULATORY_CARE_PROVIDER_SITE_OTHER): Payer: Medicare Other | Admitting: Internal Medicine

## 2020-05-21 ENCOUNTER — Other Ambulatory Visit: Payer: Self-pay

## 2020-05-21 VITALS — BP 140/72 | HR 82 | Temp 98.6°F | Ht 64.0 in | Wt 94.0 lb

## 2020-05-21 DIAGNOSIS — E559 Vitamin D deficiency, unspecified: Secondary | ICD-10-CM | POA: Diagnosis not present

## 2020-05-21 DIAGNOSIS — M545 Low back pain, unspecified: Secondary | ICD-10-CM | POA: Diagnosis not present

## 2020-05-21 DIAGNOSIS — R634 Abnormal weight loss: Secondary | ICD-10-CM | POA: Diagnosis not present

## 2020-05-21 DIAGNOSIS — R202 Paresthesia of skin: Secondary | ICD-10-CM | POA: Diagnosis not present

## 2020-05-21 LAB — VITAMIN B12: Vitamin B-12: 499 pg/mL (ref 211–911)

## 2020-05-21 LAB — COMPREHENSIVE METABOLIC PANEL
ALT: 20 U/L (ref 0–35)
AST: 20 U/L (ref 0–37)
Albumin: 4.1 g/dL (ref 3.5–5.2)
Alkaline Phosphatase: 135 U/L — ABNORMAL HIGH (ref 39–117)
BUN: 17 mg/dL (ref 6–23)
CO2: 29 mEq/L (ref 19–32)
Calcium: 10.2 mg/dL (ref 8.4–10.5)
Chloride: 99 mEq/L (ref 96–112)
Creatinine, Ser: 0.78 mg/dL (ref 0.40–1.20)
GFR: 71.19 mL/min (ref >60.00)
Glucose, Bld: 94 mg/dL (ref 70–99)
Potassium: 4.8 mEq/L (ref 3.5–5.1)
Sodium: 137 mEq/L (ref 135–145)
Total Bilirubin: 0.7 mg/dL (ref 0.2–1.2)
Total Protein: 7.9 g/dL (ref 6.0–8.3)

## 2020-05-21 LAB — TSH: TSH: 2.12 u[IU]/mL (ref 0.35–4.50)

## 2020-05-21 LAB — VITAMIN D 25 HYDROXY (VIT D DEFICIENCY, FRACTURES): VITD: 52.41 ng/mL (ref 30.00–100.00)

## 2020-05-21 NOTE — Patient Instructions (Addendum)
Take Tylenolo 500 mg 3 times a day, Tramadol 1/4 tab 3 times a day Use Voltaren gel 1 inch 3-4 times a day Colace or Miralax as needed Use ice or heat

## 2020-05-21 NOTE — Progress Notes (Signed)
   Subjective:  Patient ID: Tanya Walker, female    DOB: [birth year]  Age: 80 y.o. MRN: [id number]  CC: Follow-up (6 month f/u)   HPI ARLENIS BLAYDES presents for back pain since Feb 8th  S/p Bilateral KYPHOPLASTY T12 3/28. Pain is not better - on Diclofenac po now. Tramadol was causing constipation  Outpatient Medications Prior to Visit  Medication Sig Dispense Refill  . acetaminophen (TYLENOL) 500 MG tablet Take 1,000 mg by mouth every 6 (six) hours as needed for moderate pain.    . cetirizine (ZYRTEC ALLERGY) 10 MG tablet Take 1 tablet (10 mg total) by mouth daily. 30 tablet 11  . Cholecalciferol (VITAMIN D3) 2000 units capsule Take 1 capsule (2,000 Units total) by mouth daily. (Patient taking differently: Take 2,000 Units by mouth in the morning and at bedtime.) 100 capsule 3  . diclofenac (CATAFLAM) 50 MG tablet Take 50 mg by mouth 2 (two) times daily as needed. Take 1 by mouth once a day as needed for pain    . Multiple Vitamins-Minerals (PRESERVISION AREDS 2 PO) Take 1 tablet by mouth 2 (two) times daily.    . Multiple Vitamins-Minerals (PRESERVISION AREDS PO) Take 1 tablet by mouth.    . Omega-3 Fatty Acids (SUPER OMEGA-3) 1000 MG CAPS Super Omega-3    . Multiple Vitamins-Minerals (PRESERVISION AREDS 2+MULTI VIT PO) PreserVision AREDS (Patient not taking: No sig reported)    . nystatin-triamcinolone (MYCOLOG II) cream Apply 1 application topically 2 (two) times daily. (Patient not taking: Reported on 05/21/2020) 30 g 0  . tiZANidine (ZANAFLEX) 2 MG tablet TAKE 1 TABLET(2 MG) BY MOUTH EVERY 6 HOURS AS NEEDED FOR MUSCLE SPASMS (Patient not taking: Reported on 05/21/2020) 40 tablet 1  . traMADol (ULTRAM) 50 MG tablet Take 1 tablet (50 mg total) by mouth every 6 (six) hours as needed. (Patient not taking: Reported on 05/21/2020) 30 tablet 0  . vitamin C (ASCORBIC ACID) 500 MG tablet Take 500 mg by mouth 2 (two) times daily. (Patient not taking: Reported on 05/21/2020)     No  facility-administered medications prior to visit.    ROS: Review of Systems  Objective:  BP 140/72 (BP Location: Left Arm)   Pulse 82   Temp 98.6 F (37 C) (Oral)   Ht 5\' 4"  (1.626 m)   Wt 94 lb (42.6 kg)   SpO2 95%   BMI 16.14 kg/m   BP Readings from Last 3 Encounters:  05/21/20 140/72  05/04/20 (!) 149/79  03/27/20 110/64    Wt Readings from Last 3 Encounters:  05/21/20 94 lb (42.6 kg)  05/04/20 102 lb (46.3 kg)  03/27/20 106 lb (48.1 kg)    Physical Exam  Lab Results  Component Value Date   WBC 6.5 05/04/2020   HGB 12.9 05/04/2020   HCT 39.5 05/04/2020   PLT 227 05/04/2020   GLUCOSE 84 11/01/2019   CHOL 201 (H) 11/01/2019   TRIG 52 11/01/2019   HDL 69 11/01/2019   LDLDIRECT 135.4 12/26/2008   LDLCALC 118 (H) 11/01/2019   ALT 14 11/01/2019   AST 20 11/01/2019   NA 140 11/01/2019   K 3.9 11/01/2019   CL 101 11/01/2019   CREATININE 0.69 11/01/2019   BUN 15 11/01/2019   CO2 26 11/01/2019   TSH 1.86 11/01/2019    No results found.  Assessment & Plan:    Walker Kehr, MD

## 2020-05-21 NOTE — Assessment & Plan Note (Signed)
On Vit D 

## 2020-05-25 ENCOUNTER — Encounter: Payer: Self-pay | Admitting: Internal Medicine

## 2020-05-25 NOTE — Assessment & Plan Note (Addendum)
  Take Tylenolo 500 mg 3 times a day, Tramadol 1/4 tab 3 times a day Use Voltaren gel 1 inch 3-4 times a day Colace or Miralax as needed Use ice or heat

## 2020-06-02 DIAGNOSIS — M545 Low back pain, unspecified: Secondary | ICD-10-CM | POA: Diagnosis not present

## 2020-06-02 DIAGNOSIS — M5416 Radiculopathy, lumbar region: Secondary | ICD-10-CM | POA: Diagnosis not present

## 2020-06-05 DIAGNOSIS — S22080A Wedge compression fracture of T11-T12 vertebra, initial encounter for closed fracture: Secondary | ICD-10-CM | POA: Diagnosis not present

## 2020-06-05 DIAGNOSIS — S22080K Wedge compression fracture of T11-T12 vertebra, subsequent encounter for fracture with nonunion: Secondary | ICD-10-CM | POA: Diagnosis not present

## 2020-06-08 ENCOUNTER — Other Ambulatory Visit: Payer: Self-pay

## 2020-06-08 ENCOUNTER — Ambulatory Visit (INDEPENDENT_AMBULATORY_CARE_PROVIDER_SITE_OTHER): Payer: Medicare Other | Admitting: Internal Medicine

## 2020-06-08 ENCOUNTER — Encounter: Payer: Self-pay | Admitting: Internal Medicine

## 2020-06-08 ENCOUNTER — Telehealth: Payer: Self-pay | Admitting: Internal Medicine

## 2020-06-08 DIAGNOSIS — S22000A Wedge compression fracture of unspecified thoracic vertebra, initial encounter for closed fracture: Secondary | ICD-10-CM | POA: Insufficient documentation

## 2020-06-08 DIAGNOSIS — M810 Age-related osteoporosis without current pathological fracture: Secondary | ICD-10-CM

## 2020-06-08 DIAGNOSIS — R634 Abnormal weight loss: Secondary | ICD-10-CM | POA: Diagnosis not present

## 2020-06-08 DIAGNOSIS — M545 Low back pain, unspecified: Secondary | ICD-10-CM

## 2020-06-08 MED ORDER — GABAPENTIN 100 MG PO CAPS: 100.0000 mg | ORAL_CAPSULE | Freq: Three times a day (TID) | ORAL | 3 refills | Status: AC

## 2020-06-08 MED ORDER — EVENITY 105 MG/1.17ML ~~LOC~~ SOSY: 210.0000 mg | PREFILLED_SYRINGE | SUBCUTANEOUS | 11 refills | Status: DC

## 2020-06-08 NOTE — Telephone Encounter (Signed)
Provider would like to get patient started on Evenity J3111  Entered benefits into [employer] Portal on 5.2.22

## 2020-06-08 NOTE — Assessment & Plan Note (Addendum)
Worse F/u w/NS Start Entevo inj Try Gabapentin CBD gummies Intolerant of opioids Will start Evenity if improved in the view of her compression fractures.

## 2020-06-08 NOTE — Assessment & Plan Note (Addendum)
Try Gabapentin CBD gummies Intolerant of opioids I am not able to see her latest MRI report

## 2020-06-08 NOTE — Patient Instructions (Addendum)
Romosozumab injection What is this medicine? ROMOSOZUMAB (roe moe SOZ ue mab) increases bone formation. It is used to treat osteoporosis in women. This medicine may be used for other purposes; ask your health care provider or pharmacist if you have questions. COMMON BRAND NAME(S): EVENITY What should I tell my health care provider before I take this medicine? They need to know if you have any of these conditions:  dental disease or wear dentures  heart disease  history of stroke  kidney disease  low levels of calcium in the blood  an unusual or allergic reaction to romosozumab, other medicines, foods, dyes or preservatives  pregnant or trying to get pregnant  breast-feeding How should I use this medicine? This medicine is for injection under the skin. It is given by a healthcare professional in a hospital or clinic setting. Talk to your pediatrician about the use of this medicine in children. Special care may be needed. Overdosage: If you think you have taken too much of this medicine contact a poison control center or emergency room at once. NOTE: This medicine is only for you. Do not share this medicine with others. What if I miss a dose? Keep appointments for follow-up doses. It is important not to miss your dose. Call your doctor or healthcare professional if you are unable to keep an appointment. What may interact with this medicine? Interactions are not expected. This list may not describe all possible interactions. Give your health care provider a list of all the medicines, herbs, non-prescription drugs, or dietary supplements you use. Also tell them if you smoke, drink alcohol, or use illegal drugs. Some items may interact with your medicine. What should I watch for while using this medicine? Your condition will be monitored carefully while you are receiving this medicine. You may need blood work done while you are taking this medicine. Some people who take this medicine  have severe bone, joint, or muscle pain. This medicine may also increase your risk for jaw problems or a broken thigh bone. Tell your healthcare professional right away if you have severe pain in your jaw, bones, joints, or muscles. Tell your healthcare professional if you have any pain that does not go away or that gets worse. You should make sure you get enough calcium and vitamin D while you are taking this medicine. Discuss the foods you eat and the vitamins you take with your healthcare professional. Tell your dentist and dental surgeon that you are taking this medicine. You should not have major dental surgery while on this medicine. See your dentist to have a dental exam and fix any dental problems before starting this medicine. Take good care of your teeth while on this medicine. Make sure you see your dentist for regular follow-up appointments. What side effects may I notice from receiving this medicine? Side effects that you should report to your doctor or health care professional as soon as possible:  allergic reactions like skin rash, itching or hives, swelling of the face, lips, or tongue  bone pain  chest pain or chest tightness  jaw pain, especially after dental work  signs and symptoms of a stroke like changes in vision; confusion; trouble speaking or understanding; severe headaches; sudden numbness or weakness of the face, arm or leg; trouble walking; dizziness; loss of balance or coordination  signs and symptoms of low calcium like fast heartbeat; muscle cramps; muscle pain; pain, tingling, numbness in the hands or feet; seizures Side effects that usually do not require  medical attention (report these to your doctor or health care professional if they continue or are bothersome):  headache  joint pain  pain, redness, or irritation at site where injected  pain, tingling, numbness in the hands or feet  swelling of ankles, feet, hands  trouble sleeping This list may not  describe all possible side effects. Call your doctor for medical advice about side effects. You may report side effects to FDA at 1-800-FDA-1088. Where should I keep my medicine? This medicine is given in a hospital or clinic and will not be stored at home. NOTE: This sheet is a summary. It may not cover all possible information. If you have questions about this medicine, talk to your doctor, pharmacist, or health care provider.  2021 Elsevier/Gold Standard (2017-05-18 01:15:32)    Try CBD gummies for pain

## 2020-06-08 NOTE — Assessment & Plan Note (Addendum)
Wt Readings from Last 3 Encounters:  06/08/20 95 lb 9.6 oz (43.4 kg)  05/21/20 94 lb (42.6 kg)  05/04/20 102 lb (46.3 kg)  She was asked to eat better.

## 2020-06-08 NOTE — Assessment & Plan Note (Addendum)
Evenety vs Prolia discussed.  Will start Evenity if improved in the view of her compression fractures.

## 2020-06-09 ENCOUNTER — Encounter: Payer: Self-pay | Admitting: Internal Medicine

## 2020-06-09 NOTE — Progress Notes (Signed)
Subjective:  Patient ID: Tanya Walker, female    DOB: 05/13/1940  Age: 80 y.o. MRN: [id number]  CC: Follow-up (2 week f/u)   HPI Tanya Walker presents for a severe low back pain.  Apparently she had another MRI and was detected to have 2 more compression fractures below T12.  She has not been able to take tramadol due to side effects, mostly constipation.  Outpatient Medications Prior to Visit  Medication Sig Dispense Refill  . acetaminophen (TYLENOL) 500 MG tablet Take 1,000 mg by mouth every 6 (six) hours as needed for moderate pain.    . cetirizine (ZYRTEC ALLERGY) 10 MG tablet Take 1 tablet (10 mg total) by mouth daily. 30 tablet 11  . Cholecalciferol (VITAMIN D3) 2000 units capsule Take 1 capsule (2,000 Units total) by mouth daily. (Patient taking differently: Take 2,000 Units by mouth in the morning and at bedtime.) 100 capsule 3  . diclofenac (CATAFLAM) 50 MG tablet Take 50 mg by mouth 2 (two) times daily as needed. Take 1 by mouth once a day as needed for pain    . Multiple Vitamins-Minerals (PRESERVISION AREDS 2 PO) Take 1 tablet by mouth 2 (two) times daily.    . Omega-3 Fatty Acids (SUPER OMEGA-3) 1000 MG CAPS Super Omega-3    . Multiple Vitamins-Minerals (PRESERVISION AREDS PO) Take 1 tablet by mouth.     No facility-administered medications prior to visit.    ROS: Review of Systems  Constitutional: Negative for activity change, appetite change, chills, fatigue and unexpected weight change.  HENT: Negative for congestion, mouth sores and sinus pressure.   Eyes: Negative for visual disturbance.  Respiratory: Negative for cough and chest tightness.   Gastrointestinal: Positive for constipation. Negative for abdominal pain and nausea.  Genitourinary: Negative for difficulty urinating, frequency and vaginal pain.  Musculoskeletal: Positive for back pain. Negative for gait problem.  Skin: Negative for pallor and rash.  Neurological: Negative for dizziness, tremors, weakness,  numbness and headaches.  Psychiatric/Behavioral: Negative for confusion and sleep disturbance.    Objective:  BP 130/68 (BP Location: Left Arm)   Pulse 98   Temp 98.2 F (36.8 C) (Oral)   Wt 95 lb 9.6 oz (43.4 kg)   SpO2 95%   BMI 16.41 kg/m   BP Readings from Last 3 Encounters:  06/08/20 130/68  05/21/20 140/72  05/04/20 (!) 149/79    Wt Readings from Last 3 Encounters:  06/08/20 95 lb 9.6 oz (43.4 kg)  05/21/20 94 lb (42.6 kg)  05/04/20 102 lb (46.3 kg)    Physical Exam  Lab Results  Component Value Date   WBC 6.5 05/04/2020   HGB 12.9 05/04/2020   HCT 39.5 05/04/2020   PLT 227 05/04/2020   GLUCOSE 94 05/21/2020   CHOL 201 (H) 11/01/2019   TRIG 52 11/01/2019   HDL 69 11/01/2019   LDLDIRECT 135.4 12/26/2008   LDLCALC 118 (H) 11/01/2019   ALT 20 05/21/2020   AST 20 05/21/2020   NA 137 05/21/2020   K 4.8 05/21/2020   CL 99 05/21/2020   CREATININE 0.78 05/21/2020   BUN 17 05/21/2020   CO2 29 05/21/2020   TSH 2.12 05/21/2020    No results found.  Assessment & Plan:   Shenique was seen today for follow-up.  Diagnoses and all orders for this visit:  Age-related osteoporosis without current pathological fracture  Acute bilateral low back pain without sciatica  Compression fracture of body of thoracic vertebra (HCC)  Weight loss, abnormal  Other orders -     Romosozumab-aqqg (EVENITY) 105 MG/1.17ML SOSY injection; Inject 210 mg into the skin every 30 (thirty) days. -     gabapentin (NEURONTIN) 100 MG capsule; Take 1 capsule (100 mg total) by mouth 3 (three) times daily.     Meds ordered this encounter  Medications  . Romosozumab-aqqg (EVENITY) 105 MG/1.17ML SOSY injection    Sig: Inject 210 mg into the skin every 30 (thirty) days.    Dispense:  2.4 mL    Refill:  11  . gabapentin (NEURONTIN) 100 MG capsule    Sig: Take 1 capsule (100 mg total) by mouth 3 (three) times daily.    Dispense:  90 capsule    Refill:  3     Follow-up: Return in  about 2 weeks (around 06/22/2020) for a follow-up visit.  Walker Kehr, MD

## 2020-06-10 NOTE — Telephone Encounter (Signed)
LVM with the patient to schedule evenity [id number] No auth required No oop, has met deductible at this time Can schedule at anytime, then after every 30 days

## 2020-06-11 NOTE — Telephone Encounter (Signed)
appt 06/22/20 will get injection at that time.Marland KitchenJohny Walker

## 2020-06-15 ENCOUNTER — Telehealth: Payer: Self-pay | Admitting: Internal Medicine

## 2020-06-15 NOTE — Telephone Encounter (Signed)
Yes She needs to start Evenity inj Thx

## 2020-06-15 NOTE — Telephone Encounter (Signed)
Patient called and was wondering if recent MRI results have been received. She said they are from [facility] from 06-02-20. Please advise.

## 2020-06-22 ENCOUNTER — Ambulatory Visit: Payer: Medicare Other | Admitting: Internal Medicine

## 2020-06-24 ENCOUNTER — Telehealth: Payer: Self-pay | Admitting: Internal Medicine

## 2020-06-24 NOTE — Telephone Encounter (Signed)
Notified pt w/MD response.../lmb 

## 2020-06-24 NOTE — Telephone Encounter (Signed)
Patients husband called and said that she has been constipated for a few days. He said that she recently had back surgery and thinks that the pain medication is causing this. He was wondering what to do to help her. He can be reached at [phone]. Please advise

## 2020-06-24 NOTE — Telephone Encounter (Signed)
[facility] FYI  Caller states that she had back surgery on the 28th of march. She is still having pain, she stopped taking pain medication. She is having problems with constipation and feels bloated. Last bowel movement was this morning but not very much. Last full bowel movement was 2-3 days ago.  Advised to SEE PCP WITHIN 24 HOURS: * IF OFFICE WILL BE OPEN: You need to be examined within the next 24 hours. Call your doctor (or NP/PA) when the office opens and make an appointment. GENERAL CONSTIPATION INSTRUCTIONS: * Eat a high fiber diet. * Drink adequate liquids. OSMOTIC LAXATIVES: * MIRALAX (POLYETHYLENE GLYCOL 3350): Miralax is an 'osmotic' agent which means that it binds water and causes water to be retained within the stool. You can use this laxative to treat occasional constipation. Do not use for more than 2 weeks without approval from your doctor. Generally, Miralax produces a bowel movement in 1 to 3 days. Side effects include diarrhea (especially at higher doses). If you are pregnant, discuss with your doctor before using. Available in the [place]. CALL BACK IF: * Severe or increasing abdomen pain * Constant abdomen pain lasting over 2 hours * Vomiting occurs * You become worse CARE ADVICE given per Constipation (Adult) guideline.

## 2020-06-24 NOTE — Telephone Encounter (Signed)
Fleet laxative 2 tab bid - Bisacodyl Fleet enema in 4 h Thx

## 2020-06-24 NOTE — Telephone Encounter (Signed)
Patient called and was wondering if she could take miralax in the morning and at night. She said that she is not comfortable driving after back surgery and her husband does not drive she said. Please advise

## 2020-06-25 NOTE — Telephone Encounter (Signed)
Yes.  Use 1 or 2 scoops of MiraLAX twice a day.  Thanks

## 2020-06-26 NOTE — Telephone Encounter (Signed)
Notified pt w/MD response.../lmb 

## 2020-06-29 ENCOUNTER — Ambulatory Visit (INDEPENDENT_AMBULATORY_CARE_PROVIDER_SITE_OTHER): Payer: Medicare Other | Admitting: Internal Medicine

## 2020-06-29 ENCOUNTER — Ambulatory Visit: Payer: Medicare Other | Admitting: Internal Medicine

## 2020-06-29 ENCOUNTER — Other Ambulatory Visit: Payer: Self-pay

## 2020-06-29 ENCOUNTER — Encounter: Payer: Self-pay | Admitting: Internal Medicine

## 2020-06-29 DIAGNOSIS — S22000A Wedge compression fracture of unspecified thoracic vertebra, initial encounter for closed fracture: Secondary | ICD-10-CM | POA: Diagnosis not present

## 2020-06-29 DIAGNOSIS — E559 Vitamin D deficiency, unspecified: Secondary | ICD-10-CM

## 2020-06-29 DIAGNOSIS — F4323 Adjustment disorder with mixed anxiety and depressed mood: Secondary | ICD-10-CM

## 2020-06-29 DIAGNOSIS — M545 Low back pain, unspecified: Secondary | ICD-10-CM | POA: Diagnosis not present

## 2020-06-29 DIAGNOSIS — R634 Abnormal weight loss: Secondary | ICD-10-CM

## 2020-06-29 MED ORDER — KETOROLAC TROMETHAMINE 30 MG/ML IJ SOLN
30.0000 mg | Freq: Once | INTRAMUSCULAR | Status: AC
  Administered 2020-06-29: 30 mg via INTRAMUSCULAR

## 2020-06-29 MED ORDER — BUPRENORPHINE 5 MCG/HR TD PTWK
4.0000 | MEDICATED_PATCH | TRANSDERMAL | 0 refills | Status: DC
Start: 2020-06-29 — End: 2020-08-19

## 2020-06-29 MED ORDER — ROMOSOZUMAB-AQQG 105 MG/1.17ML ~~LOC~~ SOSY
210.0000 mg | PREFILLED_SYRINGE | Freq: Once | SUBCUTANEOUS | Status: AC
  Administered 2020-06-29: 210 mg via SUBCUTANEOUS

## 2020-06-29 NOTE — Assessment & Plan Note (Addendum)
Wt Readings from Last 3 Encounters:  06/29/20 90 lb 6.4 oz (41 kg)  06/08/20 95 lb 9.6 oz (43.4 kg)  05/21/20 94 lb (42.6 kg)  Treat pain Eat better, hydrate better

## 2020-06-29 NOTE — Assessment & Plan Note (Addendum)
Start Evenity: it is approved - start today Pt needs a pediatric back brace

## 2020-06-29 NOTE — Progress Notes (Addendum)
Subjective:  Patient ID: Tanya Walker, female    DOB: 1940-03-03  Age: 80 y.o. MRN: [id number]  CC: Follow-up (2 week f/u) and Weight Loss   HPI Tanya Walker presents for a thoracic back pain - it has been severe.  It is better when she would lay down and stay still.  However, she still complaining of insomnia due to pain.  She is saying that the pain is driving her crazy.  She was refusing to take pain meds due to constipation.  She tried Lidoderm patch without relief. Pt refused pain meds, NSAIDs again C/o wt loss.  She has not been eating much.  She is thinking she is getting dehydrated "I'm loosing my mind, not sleeping" She was offered a back brace.  She declined it because it was too big They live at [facility], their family is out of town.  John has not been good physical health himself C/o constipation   Outpatient Medications Prior to Visit  Medication Sig Dispense Refill  . Multiple Vitamins-Minerals (PRESERVISION AREDS 2 PO) Take 1 tablet by mouth 2 (two) times daily.    . Omega-3 Fatty Acids (SUPER OMEGA-3) 1000 MG CAPS Super Omega-3    . acetaminophen (TYLENOL) 500 MG tablet Take 1,000 mg by mouth every 6 (six) hours as needed for moderate pain. (Patient not taking: Reported on 06/29/2020)    . cetirizine (ZYRTEC ALLERGY) 10 MG tablet Take 1 tablet (10 mg total) by mouth daily. (Patient not taking: Reported on 06/29/2020) 30 tablet 11  . Cholecalciferol (VITAMIN D3) 2000 units capsule Take 1 capsule (2,000 Units total) by mouth daily. (Patient not taking: Reported on 06/29/2020) 100 capsule 3  . gabapentin (NEURONTIN) 100 MG capsule Take 1 capsule (100 mg total) by mouth 3 (three) times daily. (Patient not taking: Reported on 06/29/2020) 90 capsule 3  . Romosozumab-aqqg (EVENITY) 105 MG/1.17ML SOSY injection Inject 210 mg into the skin every 30 (thirty) days. (Patient not taking: Reported on 06/29/2020) 2.4 mL 11  . diclofenac (CATAFLAM) 50 MG tablet Take 50 mg by mouth 2 (two)  times daily as needed. Take 1 by mouth once a day as needed for pain (Patient not taking: Reported on 06/29/2020)     No facility-administered medications prior to visit.    ROS: Review of Systems  Constitutional: Positive for fatigue and unexpected weight change. Negative for activity change, appetite change and chills.  HENT: Negative for congestion, mouth sores and sinus pressure.   Eyes: Negative for visual disturbance.  Respiratory: Negative for cough and chest tightness.   Gastrointestinal: Negative for abdominal pain and nausea.  Genitourinary: Negative for difficulty urinating, frequency and vaginal pain.  Musculoskeletal: Positive for back pain. Negative for gait problem.  Skin: Negative for pallor and rash.  Neurological: Positive for weakness. Negative for dizziness, tremors, numbness and headaches.  Psychiatric/Behavioral: Positive for decreased concentration, dysphoric mood and sleep disturbance. Negative for behavioral problems, confusion and suicidal ideas. The patient is nervous/anxious.     Objective:  BP 120/84 (BP Location: Left Arm)   Pulse 96   Temp 98.5 F (36.9 C) (Oral)   Ht 5\' 4"  (1.626 m)   Wt 90 lb 6.4 oz (41 kg)   SpO2 96%   BMI 15.52 kg/m   BP Readings from Last 3 Encounters:  06/29/20 120/84  06/08/20 130/68  05/21/20 140/72    Wt Readings from Last 3 Encounters:  06/29/20 90 lb 6.4 oz (41 kg)  06/08/20 95 lb 9.6 oz (  43.4 kg)  05/21/20 94 lb (42.6 kg)    Physical Exam Constitutional:      General: She is not in acute distress.    Appearance: She is well-developed.  HENT:     Head: Normocephalic.     Right Ear: External ear normal.     Left Ear: External ear normal.     Nose: Nose normal.  Eyes:     General:        Right eye: No discharge.        Left eye: No discharge.     Conjunctiva/sclera: Conjunctivae normal.     Pupils: Pupils are equal, round, and reactive to light.  Neck:     Thyroid: No thyromegaly.     Vascular: No JVD.      Trachea: No tracheal deviation.  Cardiovascular:     Rate and Rhythm: Normal rate and regular rhythm.     Heart sounds: Normal heart sounds.  Pulmonary:     Effort: No respiratory distress.     Breath sounds: No stridor. No wheezing.  Abdominal:     General: Bowel sounds are normal. There is no distension.     Palpations: Abdomen is soft. There is no mass.     Tenderness: There is no abdominal tenderness. There is no guarding or rebound.  Musculoskeletal:        General: Tenderness present.     Cervical back: Normal range of motion and neck supple.  Lymphadenopathy:     Cervical: No cervical adenopathy.  Skin:    Findings: No erythema or rash.  Neurological:     Cranial Nerves: No cranial nerve deficit.     Motor: No abnormal muscle tone.     Coordination: Coordination normal.     Gait: Gait abnormal.     Deep Tendon Reflexes: Reflexes normal.  Psychiatric:        Behavior: Behavior normal.        Thought Content: Thought content normal.   anxious Thoracic back pain w/ROM, palpation HEENT - not dry    A total time of 51 minutes was spent preparing to see the patient, reviewing tests, x-rays, operative reports and outside records.  Also, obtaining history and performing comprehensive physical exam.  Additionally, counseling the patient regarding the above listed issues: LBP, compression fx.   Finally, documenting clinical information in the health records, coordination of care, educating the patient. It is a complex case.   Lab Results  Component Value Date   WBC 6.5 05/04/2020   HGB 12.9 05/04/2020   HCT 39.5 05/04/2020   PLT 227 05/04/2020   GLUCOSE 94 05/21/2020   CHOL 201 (H) 11/01/2019   TRIG 52 11/01/2019   HDL 69 11/01/2019   LDLDIRECT 135.4 12/26/2008   LDLCALC 118 (H) 11/01/2019   ALT 20 05/21/2020   AST 20 05/21/2020   NA 137 05/21/2020   K 4.8 05/21/2020   CL 99 05/21/2020   CREATININE 0.78 05/21/2020   BUN 17 05/21/2020   CO2 29 05/21/2020   TSH  2.12 05/21/2020    No results found.  Assessment & Plan:     Follow-up: No follow-ups on file.  Walker Kehr, MD

## 2020-06-29 NOTE — Assessment & Plan Note (Addendum)
Pt is seeing Dr Rolena Infante tomorrow Start Evenity: it is approved - pt declined then agreed [employer] Toradol 30 mg IM given LOC

## 2020-06-29 NOTE — Assessment & Plan Note (Signed)
Discussed in detail Treat pain Eat better, hydrate better

## 2020-06-29 NOTE — Patient Instructions (Addendum)
Pt needs a pediatric back brace Drink 60 oz of water a day Start Evenity: it is approved  See Dr Rolena Infante tomorrow Start Butrans patch for pain

## 2020-06-29 NOTE — Assessment & Plan Note (Signed)
On Vit D 

## 2020-06-30 DIAGNOSIS — M4856XA Collapsed vertebra, not elsewhere classified, lumbar region, initial encounter for fracture: Secondary | ICD-10-CM | POA: Diagnosis not present

## 2020-06-30 DIAGNOSIS — M545 Low back pain, unspecified: Secondary | ICD-10-CM | POA: Diagnosis not present

## 2020-06-30 DIAGNOSIS — M4854XA Collapsed vertebra, not elsewhere classified, thoracic region, initial encounter for fracture: Secondary | ICD-10-CM | POA: Diagnosis not present

## 2020-07-01 ENCOUNTER — Other Ambulatory Visit: Payer: Self-pay | Admitting: *Deleted

## 2020-07-01 NOTE — Telephone Encounter (Signed)
This is what I have prescribed-Butrans patch Thanks

## 2020-07-01 NOTE — Telephone Encounter (Signed)
BUTRANS DIS 5MCG/HR

## 2020-07-01 NOTE — Telephone Encounter (Signed)
Rec'd fax stating Buprenorphine 5 mcg.HR patches are not under the patient preferred plan. The preferred alternative is BUTRANS...Johny Chess

## 2020-07-02 ENCOUNTER — Ambulatory Visit: Payer: Medicare Other | Admitting: Internal Medicine

## 2020-07-07 ENCOUNTER — Ambulatory Visit: Payer: Medicare Other | Admitting: Internal Medicine

## 2020-07-09 ENCOUNTER — Other Ambulatory Visit: Payer: Self-pay

## 2020-07-09 ENCOUNTER — Emergency Department (HOSPITAL_COMMUNITY)
Admission: EM | Admit: 2020-07-09 | Discharge: 2020-07-09 | Disposition: A | Discharge status: Home / Self Care | Payer: Medicare Other | Attending: Emergency Medicine | Admitting: Emergency Medicine

## 2020-07-09 DIAGNOSIS — I10 Essential (primary) hypertension: Secondary | ICD-10-CM | POA: Diagnosis not present

## 2020-07-09 DIAGNOSIS — K5900 Constipation, unspecified: Secondary | ICD-10-CM | POA: Insufficient documentation

## 2020-07-09 DIAGNOSIS — R634 Abnormal weight loss: Secondary | ICD-10-CM | POA: Insufficient documentation

## 2020-07-09 DIAGNOSIS — M549 Dorsalgia, unspecified: Secondary | ICD-10-CM | POA: Diagnosis not present

## 2020-07-09 DIAGNOSIS — Z85828 Personal history of other malignant neoplasm of skin: Secondary | ICD-10-CM | POA: Insufficient documentation

## 2020-07-09 DIAGNOSIS — I959 Hypotension, unspecified: Secondary | ICD-10-CM | POA: Diagnosis not present

## 2020-07-09 DIAGNOSIS — G8929 Other chronic pain: Secondary | ICD-10-CM | POA: Diagnosis not present

## 2020-07-09 LAB — COMPREHENSIVE METABOLIC PANEL
ALT: 15 U/L (ref 0–44)
AST: 18 U/L (ref 15–41)
Albumin: 3.1 g/dL — ABNORMAL LOW (ref 3.5–5.0)
Alkaline Phosphatase: 73 U/L (ref 38–126)
Anion gap: 6 (ref 5–15)
BUN: 18 mg/dL (ref 8–23)
CO2: 24 mmol/L (ref 22–32)
Calcium: 7.5 mg/dL — ABNORMAL LOW (ref 8.9–10.3)
Chloride: 109 mmol/L (ref 98–111)
Creatinine, Ser: 0.54 mg/dL (ref 0.44–1.00)
GFR, Estimated: >60 mL/min (ref >60)
Glucose, Bld: 81 mg/dL (ref 70–99)
Potassium: 3.4 mmol/L — ABNORMAL LOW (ref 3.5–5.1)
Sodium: 139 mmol/L (ref 135–145)
Total Bilirubin: 0.7 mg/dL (ref 0.3–1.2)
Total Protein: 5.5 g/dL — ABNORMAL LOW (ref 6.5–8.1)

## 2020-07-09 LAB — URINALYSIS, ROUTINE W REFLEX MICROSCOPIC
Bilirubin Urine: NEGATIVE
Glucose, UA: NEGATIVE mg/dL
Hgb urine dipstick: NEGATIVE
Ketones, ur: 20 mg/dL — AB
Leukocytes,Ua: NEGATIVE
Nitrite: NEGATIVE
Protein, ur: NEGATIVE mg/dL
Specific Gravity, Urine: 1.008 (ref 1.005–1.030)
pH: 7 (ref 5.0–8.0)

## 2020-07-09 LAB — CBC WITH DIFFERENTIAL/PLATELET
Abs Immature Granulocytes: 0 10*3/uL (ref 0.00–0.07)
Basophils Absolute: 0 10*3/uL (ref 0.0–0.1)
Basophils Relative: 1 %
Eosinophils Absolute: 0.1 10*3/uL (ref 0.0–0.5)
Eosinophils Relative: 3 %
HCT: 39.9 % (ref 36.0–46.0)
Hemoglobin: 12.8 g/dL (ref 12.0–15.0)
Immature Granulocytes: 0 %
Lymphocytes Relative: 24 %
Lymphs Abs: 1.3 10*3/uL (ref 0.7–4.0)
MCH: 30.7 pg (ref 26.0–34.0)
MCHC: 32.1 g/dL (ref 30.0–36.0)
MCV: 95.7 fL (ref 80.0–100.0)
Monocytes Absolute: 0.6 10*3/uL (ref 0.1–1.0)
Monocytes Relative: 11 %
Neutro Abs: 3.2 10*3/uL (ref 1.7–7.7)
Neutrophils Relative %: 61 %
Platelets: 127 10*3/uL — ABNORMAL LOW (ref 150–400)
RBC: 4.17 MIL/uL (ref 3.87–5.11)
RDW: 13.4 % (ref 11.5–15.5)
WBC: 5.3 10*3/uL (ref 4.0–10.5)
nRBC: 0 % (ref 0.0–0.2)

## 2020-07-09 MED ORDER — LACTATED RINGERS IV BOLUS
1000.0000 mL | Freq: Once | INTRAVENOUS | Status: AC
  Administered 2020-07-09: 1000 mL via INTRAVENOUS

## 2020-07-09 NOTE — Discharge Instructions (Addendum)
Use for large dose MiraLAX, 4-8 doses at one time and a bottle of Gatorade, shake well drink within 1 hour.  You can also try an over-the-counter enema to help with chronic constipation.  Follow-up with your primary care providers and the providers at your facility.

## 2020-07-09 NOTE — ED Notes (Signed)
Called [facility] for transport.  

## 2020-07-09 NOTE — ED Provider Notes (Signed)
[facility] DEPT Provider Note   CSN: [id number] Arrival date & time: 07/09/20  1728     History No chief complaint on file.   Tanya Walker is a 80 y.o. female.  PT had back surgery several months ago.  Has dealt with severe pain that is not well controlled.  She is also dealing with constipation secondary to the tramadol.  Does not want to eat.  Family is concerned she is losing weight.  She says there are resources to help with skilled nursing rehabilitation assisted living at her community center but she does not want to go through that because she does not it is "fun."  Her family is concerned with her weight loss.  They deny fevers she denies numbness weakness tingling.  She is able to ambulate at home.  No new trauma.        Past Medical History:  Diagnosis Date  . Anxiety    no meds  . 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 Depression    years ago  . Headache    sinus headaches  . Lipoma of arm    Right  . Medical history non-contributory   . Osteoporosis   . SVD (spontaneous vaginal delivery)    x 2  . Varicose vein   . Vitamin D deficiency     Patient Active Problem List   Diagnosis Date Noted  . Compression fracture of body of thoracic vertebra ([place]) 06/08/2020  . Low back pain 03/27/2020  . 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 Pain of left hand 02/14/2019  . Tibialis posterior tendinitis, right 07/08/2016  . Ankle sprain 06/30/2016  . Peripheral vascular disease ([place]) 04/03/2016  . Ankle pain, left 03/31/2016  . Sore in nose 03/31/2016  . Well adult exam 09/25/2015  . Adjustment disorder with mixed anxiety and depressed mood 02/03/2015  . Allergic rhinitis 05/29/2014  . Rash 06/10/2013  . URI (upper respiratory infection) 03/08/2012  . Lipoma 11/04/2009  . PARESTHESIA 12/26/2008  . Acute maxillary sinusitis 11/22/2007  . Weight loss, abnormal 08/24/2007  . Vitamin D deficiency 02/14/2007  . Varicose vein  of leg 09/01/2006  . Osteoporosis 09/01/2006    Past Surgical History:  Procedure Laterality Date  . APPENDECTOMY  1947  . CHOLECYSTECTOMY  1988  . COLONOSCOPY    . GANGLION CYST EXCISION     x2 left and right arm  . HYSTEROSCOPY WITH D & C N/A 07/15/2013   Procedure: DILATATION AND CURETTAGE /HYSTEROSCOPY;  Surgeon: Lovenia Kim, MD;  Location: [facility];  Service: Gynecology;  Laterality: N/A;  . KYPHOPLASTY Bilateral 05/04/2020   Procedure: KYPHOPLASTY THORACIC TWELVE;  Surgeon: Vallarie Mare, MD;  Location: [facility];  Service: Neurosurgery;  Laterality: Bilateral;  . [place]  . WISDOM TOOTH EXTRACTION       OB History   No obstetric history on file.     Family History  Problem Relation Age of Onset  . Hypertension Mother   . Heart disease Father   . Cancer Father     Social History   Tobacco Use  . Smoking status: Never Smoker  . Smokeless tobacco: Never Used  Vaping Use  . Vaping Use: Never used  Substance Use Topics  . Alcohol use: No  . Drug use: No    Home Medications Prior to Admission medications   Medication Sig Start Date End Date Taking? Authorizing Provider  acetaminophen (  TYLENOL) 500 MG tablet Take 1,000 mg by mouth every 6 (six) hours as needed for moderate pain. Patient not taking: Reported on 06/29/2020    [provider]  buprenorphine (BUTRANS) 5 MCG/HR [place] 4 patches onto the skin once a week. 06/29/20   Plotnikov, [name], MD  cetirizine (ZYRTEC ALLERGY) 10 MG tablet Take 1 tablet (10 mg total) by mouth daily. Patient not taking: Reported on 06/29/2020 10/28/19 10/27/20  Plotnikov, [name], MD  Cholecalciferol (VITAMIN D3) 2000 units capsule Take 1 capsule (2,000 Units total) by mouth daily. Patient not taking: Reported on 06/29/2020 12/05/16   Plotnikov, [name], MD  gabapentin (NEURONTIN) 100 MG capsule Take 1 capsule (100 mg total) by mouth 3 (three) times daily. Patient not taking: Reported on 06/29/2020  06/08/20   Plotnikov, [name], MD  Multiple Vitamins-Minerals (PRESERVISION AREDS 2 PO) Take 1 tablet by mouth 2 (two) times daily.    [provider]  Omega-3 Fatty Acids (SUPER OMEGA-3) 1000 MG CAPS Super Omega-3    [provider]  Romosozumab-aqqg (EVENITY) 105 MG/1.17ML SOSY injection Inject 210 mg into the skin every 30 (thirty) days. Patient not taking: Reported on 06/29/2020 06/08/20   Plotnikov, [name], MD    Allergies    Keflex [cephalexin], Alendronate sodium, Alendronate sodium, Risedronate sodium, and Tramadol  Review of Systems   Review of Systems  Constitutional: Positive for unexpected weight change. Negative for chills and fever.  HENT: Negative for congestion and rhinorrhea.   Respiratory: Negative for cough and shortness of breath.   Cardiovascular: Negative for chest pain and palpitations.  Gastrointestinal: Positive for constipation. Negative for diarrhea, nausea and vomiting.  Genitourinary: Negative for difficulty urinating and dysuria.  Musculoskeletal: Positive for back pain. Negative for arthralgias.  Skin: Negative for rash and wound.  Neurological: Negative for light-headedness and headaches.    Physical Exam Updated Vital Signs BP (!) 182/91   Pulse 78   Temp 97.7 F (36.5 C) (Oral)   Resp 18   SpO2 98%   Physical Exam Vitals and nursing note reviewed. Exam conducted with a chaperone present.  Constitutional:      General: She is not in acute distress.    Appearance: Normal appearance.  HENT:     Head: Normocephalic and atraumatic.     Nose: No rhinorrhea.  Eyes:     General:        Right eye: No discharge.        Left eye: No discharge.     Conjunctiva/sclera: Conjunctivae normal.  Cardiovascular:     Rate and Rhythm: Normal rate and regular rhythm.  Pulmonary:     Effort: Pulmonary effort is normal. No respiratory distress.     Breath sounds: No stridor.  Abdominal:     General: Abdomen is flat. There is no  distension.     Palpations: Abdomen is soft.  Musculoskeletal:        General: No tenderness or signs of injury.  Skin:    General: Skin is warm and dry.  Neurological:     General: No focal deficit present.     Mental Status: She is alert. Mental status is at baseline.     Motor: No weakness.  Psychiatric:        Mood and Affect: Mood normal.        Behavior: Behavior normal.     ED Results / Procedures / Treatments   Labs (all labs ordered are listed, but only abnormal results  are displayed) Labs Reviewed  CBC WITH DIFFERENTIAL/PLATELET - Abnormal; Notable for the following components:      Result Value   Platelets 127 (*)    All other components within normal limits  COMPREHENSIVE METABOLIC PANEL - Abnormal; Notable for the following components:   Potassium 3.4 (*)    Calcium 7.5 (*)    Total Protein 5.5 (*)    Albumin 3.1 (*)    All other components within normal limits  URINALYSIS, ROUTINE W REFLEX MICROSCOPIC - Abnormal; Notable for the following components:   Color, Urine STRAW (*)    Ketones, ur 20 (*)    All other components within normal limits    EKG None  Radiology No results found.  Procedures Procedures   Medications Ordered in ED Medications  lactated ringers bolus 1,000 mL (0 mLs Intravenous Stopped 07/09/20 1948)    ED Course  I have reviewed the triage vital signs and the nursing notes.  Pertinent labs & imaging results that were available during my care of the patient were reviewed by me and considered in my medical decision making (see chart for details).    MDM Rules/Calculators/A&P                          She with chronic pain secondary to recent back surgery and chronic back pathology.  History of constipation in the setting of narcotics.  Family is concerned with weight loss however weight loss is likely coming from the patient not eating and not supplementing diet.  She has facility resources to get evaluated for possible placement  rehabilitation or assisted living but she does not want to do these.  I counseled her that she may have to given her current condition.  We will get screening labs give IV fluids and attempt to disimpact any rectal impaction.  Patient's laboratory studies are fairly unremarkable.  No signs of endorgan dysfunction significant electrolyte derangement or infection.  No signs of bloodline abnormalities after my review.  Patient has issues with chronic constipation and chronic pain.  She may need further assistance at her living facility.  They have resources to evaluate this.  She feels comfortable discharging and going to follow-up with those providers.  She is offered additional pain control she declines wanting it.  She is offered bowel cleanout regimen and she agrees to try this at home.  Return precautions discussed   Final Clinical Impression(s) / ED Diagnoses Final diagnoses:  Weight loss    Rx / DC Orders ED Discharge Orders    None       Breck Coons, MD 07/09/20 2145

## 2020-07-09 NOTE — ED Triage Notes (Signed)
BIB EMS from [facility] [address], had back sx March 28th w/ no back pain relief. Decreased app. since Tramadol, reports decreased weight. Family is concerned for decreased appetite and decline in status. Alert and oriented x4, ambulatory w/o assistance.

## 2020-07-15 DIAGNOSIS — R41841 Cognitive communication deficit: Secondary | ICD-10-CM | POA: Diagnosis not present

## 2020-07-16 ENCOUNTER — Telehealth: Payer: Self-pay | Admitting: Internal Medicine

## 2020-07-16 DIAGNOSIS — R41841 Cognitive communication deficit: Secondary | ICD-10-CM | POA: Diagnosis not present

## 2020-07-16 NOTE — Telephone Encounter (Signed)
   Patients daughter Tanya Walker calling to request anti depressant for patient, advised daughter to keep upcoming appointment to discuss

## 2020-07-17 DIAGNOSIS — R41841 Cognitive communication deficit: Secondary | ICD-10-CM | POA: Diagnosis not present

## 2020-07-20 DIAGNOSIS — R41841 Cognitive communication deficit: Secondary | ICD-10-CM | POA: Diagnosis not present

## 2020-07-20 NOTE — Telephone Encounter (Signed)
Pt has appt 6/21 will discuss at that time/../lmb

## 2020-07-21 DIAGNOSIS — R41841 Cognitive communication deficit: Secondary | ICD-10-CM | POA: Diagnosis not present

## 2020-07-24 DIAGNOSIS — R41841 Cognitive communication deficit: Secondary | ICD-10-CM | POA: Diagnosis not present

## 2020-07-27 DIAGNOSIS — R41841 Cognitive communication deficit: Secondary | ICD-10-CM | POA: Diagnosis not present

## 2020-07-28 ENCOUNTER — Other Ambulatory Visit: Payer: Self-pay

## 2020-07-28 ENCOUNTER — Encounter: Payer: Self-pay | Admitting: Internal Medicine

## 2020-07-28 ENCOUNTER — Ambulatory Visit (INDEPENDENT_AMBULATORY_CARE_PROVIDER_SITE_OTHER): Payer: Medicare Other | Admitting: Internal Medicine

## 2020-07-28 DIAGNOSIS — S22000A Wedge compression fracture of unspecified thoracic vertebra, initial encounter for closed fracture: Secondary | ICD-10-CM

## 2020-07-28 DIAGNOSIS — M810 Age-related osteoporosis without current pathological fracture: Secondary | ICD-10-CM

## 2020-07-28 DIAGNOSIS — F5101 Primary insomnia: Secondary | ICD-10-CM | POA: Diagnosis not present

## 2020-07-28 DIAGNOSIS — R300 Dysuria: Secondary | ICD-10-CM | POA: Diagnosis not present

## 2020-07-28 DIAGNOSIS — M545 Low back pain, unspecified: Secondary | ICD-10-CM | POA: Diagnosis not present

## 2020-07-28 DIAGNOSIS — F329 Major depressive disorder, single episode, unspecified: Secondary | ICD-10-CM | POA: Diagnosis not present

## 2020-07-28 DIAGNOSIS — E538 Deficiency of other specified B group vitamins: Secondary | ICD-10-CM | POA: Diagnosis not present

## 2020-07-28 MED ORDER — ROMOSOZUMAB-AQQG 105 MG/1.17ML ~~LOC~~ SOSY
210.0000 mg | PREFILLED_SYRINGE | Freq: Once | SUBCUTANEOUS | Status: AC
  Administered 2020-07-28: 210 mg via SUBCUTANEOUS

## 2020-07-28 NOTE — Progress Notes (Signed)
Subjective:  Patient ID: Tanya Walker, female    DOB: 04/11/1940  Age: 80 y.o. MRN: [id number]  CC: Follow-up [facility] f/u)   HPI Tanya Walker presents for osteoporosis, compression fx's, pain... On Evenity. Using Gabapentin.Pain is "moderate". No pain when supine.  Outpatient Medications Prior to Visit  Medication Sig Dispense Refill   acetaminophen (TYLENOL) 500 MG tablet Take 1,000 mg by mouth every 6 (six) hours as needed for moderate pain.     buprenorphine (BUTRANS) 5 MCG/HR [facility] Place 4 patches onto the skin once a week. 4 patch 0   cetirizine (ZYRTEC ALLERGY) 10 MG tablet Take 1 tablet (10 mg total) by mouth daily. 30 tablet 11   Cholecalciferol (VITAMIN D3) 2000 units capsule Take 1 capsule (2,000 Units total) by mouth daily. 100 capsule 3   gabapentin (NEURONTIN) 100 MG capsule Take 1 capsule (100 mg total) by mouth 3 (three) times daily. 90 capsule 3   lactose free nutrition (BOOST) LIQD Take 237 mLs by mouth 2 (two) times daily between meals.     Multiple Vitamins-Minerals (PRESERVISION AREDS 2 PO) Take 1 tablet by mouth 2 (two) times daily.     Omega-3 Fatty Acids (SUPER OMEGA-3) 1000 MG CAPS Super Omega-3     Romosozumab-aqqg (EVENITY) 105 MG/1.17ML SOSY injection Inject 210 mg into the skin every 30 (thirty) days. 2.4 mL 11   No facility-administered medications prior to visit.    ROS: Review of Systems  Constitutional:  Negative for activity change, appetite change, chills, fatigue and unexpected weight change.  HENT:  Negative for congestion, mouth sores and sinus pressure.   Eyes:  Negative for visual disturbance.  Respiratory:  Negative for cough and chest tightness.   Gastrointestinal:  Positive for constipation. Negative for abdominal pain and nausea.  Genitourinary:  Negative for difficulty urinating, frequency and vaginal pain.  Musculoskeletal:  Positive for back pain. Negative for gait problem.  Skin:  Negative for pallor and rash.  Neurological:  Negative  for dizziness, tremors, weakness, numbness and headaches.  Psychiatric/Behavioral:  Negative for confusion and sleep disturbance. The patient is nervous/anxious.    Objective:  BP 130/84 (BP Location: Left Arm)   Pulse (!) 102   Temp 98.2 F (36.8 C) (Oral)   Ht 5\' 4"  (1.626 m)   Wt 88 lb 9.6 oz (40.2 kg)   SpO2 96%   BMI 15.21 kg/m   BP Readings from Last 3 Encounters:  07/28/20 130/84  07/09/20 (!) 168/86  06/29/20 120/84    Wt Readings from Last 3 Encounters:  07/28/20 88 lb 9.6 oz (40.2 kg)  07/09/20 90 lb 6.4 oz (41 kg)  06/29/20 90 lb 6.4 oz (41 kg)    Physical Exam Constitutional:      General: She is not in acute distress.    Appearance: She is well-developed.  HENT:     Head: Normocephalic.     Right Ear: External ear normal.     Left Ear: External ear normal.     Nose: Nose normal.  Eyes:     General:        Right eye: No discharge.        Left eye: No discharge.     Conjunctiva/sclera: Conjunctivae normal.     Pupils: Pupils are equal, round, and reactive to light.  Neck:     Thyroid: No thyromegaly.     Vascular: No JVD.     Trachea: No tracheal deviation.  Cardiovascular:     Rate  and Rhythm: Normal rate and regular rhythm.     Heart sounds: Normal heart sounds.  Pulmonary:     Effort: No respiratory distress.     Breath sounds: No stridor. No wheezing.  Abdominal:     General: Bowel sounds are normal. There is no distension.     Palpations: Abdomen is soft. There is no mass.     Tenderness: There is no abdominal tenderness. There is no guarding or rebound.  Musculoskeletal:        General: Tenderness present.     Cervical back: Normal range of motion and neck supple.  Lymphadenopathy:     Cervical: No cervical adenopathy.  Skin:    Findings: No erythema or rash.  Neurological:     Cranial Nerves: No cranial nerve deficit.     Motor: No abnormal muscle tone.     Coordination: Coordination normal.     Deep Tendon Reflexes: Reflexes normal.   Psychiatric:        Behavior: Behavior normal.        Thought Content: Thought content normal.        Judgment: Judgment normal.    Lab Results  Component Value Date   WBC 5.3 07/09/2020   HGB 12.8 07/09/2020   HCT 39.9 07/09/2020   PLT 127 (L) 07/09/2020   GLUCOSE 81 07/09/2020   CHOL 201 (H) 11/01/2019   TRIG 52 11/01/2019   HDL 69 11/01/2019   LDLDIRECT 135.4 12/26/2008   LDLCALC 118 (H) 11/01/2019   ALT 15 07/09/2020   AST 18 07/09/2020   NA 139 07/09/2020   K 3.4 (L) 07/09/2020   CL 109 07/09/2020   CREATININE 0.54 07/09/2020   BUN 18 07/09/2020   CO2 24 07/09/2020   TSH 2.12 05/21/2020    No results found.  Assessment & Plan:   There are no diagnoses linked to this encounter.   No orders of the defined types were placed in this encounter.    Follow-up: No follow-ups on file.  Walker Kehr, MD

## 2020-07-28 NOTE — Patient Instructions (Signed)
Try SALONPAS patches for pain

## 2020-07-28 NOTE — Assessment & Plan Note (Addendum)
On Evenity. Using Gabapentin.Pain is "moderate". No pain when supine. Try SALONPAS patches for pain Not using BUTRANS patches

## 2020-07-28 NOTE — Assessment & Plan Note (Signed)
On Evenity. Using Gabapentin.Pain is "moderate". No pain when supine.

## 2020-07-28 NOTE — Addendum Note (Signed)
Addended by: Earnstine Regal on: 07/28/2020 02:39 PM   Modules accepted: Orders

## 2020-07-28 NOTE — Assessment & Plan Note (Signed)
On Evenity - will continue

## 2020-07-28 NOTE — Progress Notes (Signed)
Pls cosign for Evenity inj

## 2020-07-30 DIAGNOSIS — R41841 Cognitive communication deficit: Secondary | ICD-10-CM | POA: Diagnosis not present

## 2020-08-03 DIAGNOSIS — R41841 Cognitive communication deficit: Secondary | ICD-10-CM | POA: Diagnosis not present

## 2020-08-05 DIAGNOSIS — R41841 Cognitive communication deficit: Secondary | ICD-10-CM | POA: Diagnosis not present

## 2020-08-06 DIAGNOSIS — R41841 Cognitive communication deficit: Secondary | ICD-10-CM | POA: Diagnosis not present

## 2020-08-07 ENCOUNTER — Telehealth: Payer: Self-pay | Admitting: Internal Medicine

## 2020-08-07 NOTE — Telephone Encounter (Signed)
Type of form received: FL2 and admit order    Additional comments:   Received by: Mail   Form should be Faxed to: N/A  Form should be mailed to:  [facility], [place], [place], [address]  Is patient requesting call for pickup: N   Form placed in the Provider's box.  *Attach charge sheet.  Provider will determine charge.*  Was patient informed of  7-10 business day turn around (Y/N)? N

## 2020-08-09 DIAGNOSIS — R41841 Cognitive communication deficit: Secondary | ICD-10-CM | POA: Diagnosis not present

## 2020-08-10 DIAGNOSIS — R41841 Cognitive communication deficit: Secondary | ICD-10-CM | POA: Diagnosis not present

## 2020-08-11 DIAGNOSIS — K5900 Constipation, unspecified: Secondary | ICD-10-CM | POA: Diagnosis not present

## 2020-08-11 DIAGNOSIS — F329 Major depressive disorder, single episode, unspecified: Secondary | ICD-10-CM | POA: Diagnosis not present

## 2020-08-11 DIAGNOSIS — Z79899 Other long term (current) drug therapy: Secondary | ICD-10-CM | POA: Diagnosis not present

## 2020-08-11 DIAGNOSIS — R630 Anorexia: Secondary | ICD-10-CM | POA: Diagnosis not present

## 2020-08-11 DIAGNOSIS — F411 Generalized anxiety disorder: Secondary | ICD-10-CM | POA: Diagnosis not present

## 2020-08-11 DIAGNOSIS — M810 Age-related osteoporosis without current pathological fracture: Secondary | ICD-10-CM | POA: Diagnosis not present

## 2020-08-18 ENCOUNTER — Emergency Department (HOSPITAL_COMMUNITY): Payer: Medicare Other

## 2020-08-18 ENCOUNTER — Encounter (HOSPITAL_COMMUNITY): Payer: Self-pay

## 2020-08-18 ENCOUNTER — Other Ambulatory Visit: Payer: Self-pay

## 2020-08-18 ENCOUNTER — Inpatient Hospital Stay (HOSPITAL_COMMUNITY)
Admission: EM | Admit: 2020-08-18 | Discharge: 2020-08-20 | DRG: 056 | Disposition: A | Discharge status: Skilled Nursing Facility | Payer: Medicare Other | Source: Skilled Nursing Facility | Attending: Internal Medicine | Admitting: Internal Medicine

## 2020-08-18 DIAGNOSIS — E785 Hyperlipidemia, unspecified: Secondary | ICD-10-CM | POA: Diagnosis present

## 2020-08-18 DIAGNOSIS — Z9049 Acquired absence of other specified parts of digestive tract: Secondary | ICD-10-CM | POA: Diagnosis not present

## 2020-08-18 DIAGNOSIS — E860 Dehydration: Secondary | ICD-10-CM | POA: Diagnosis not present

## 2020-08-18 DIAGNOSIS — Z85828 Personal history of other malignant neoplasm of skin: Secondary | ICD-10-CM

## 2020-08-18 DIAGNOSIS — N3289 Other specified disorders of bladder: Secondary | ICD-10-CM | POA: Diagnosis not present

## 2020-08-18 DIAGNOSIS — Z681 Body mass index (BMI) 19 or less, adult: Secondary | ICD-10-CM | POA: Diagnosis not present

## 2020-08-18 DIAGNOSIS — R9431 Abnormal electrocardiogram [ECG] [EKG]: Secondary | ICD-10-CM | POA: Diagnosis present

## 2020-08-18 DIAGNOSIS — R5381 Other malaise: Secondary | ICD-10-CM

## 2020-08-18 DIAGNOSIS — E43 Unspecified severe protein-calorie malnutrition: Secondary | ICD-10-CM | POA: Diagnosis present

## 2020-08-18 DIAGNOSIS — R442 Other hallucinations: Secondary | ICD-10-CM | POA: Diagnosis not present

## 2020-08-18 DIAGNOSIS — S22000A Wedge compression fracture of unspecified thoracic vertebra, initial encounter for closed fracture: Secondary | ICD-10-CM | POA: Diagnosis not present

## 2020-08-18 DIAGNOSIS — Z20822 Contact with and (suspected) exposure to covid-19: Secondary | ICD-10-CM | POA: Diagnosis not present

## 2020-08-18 DIAGNOSIS — I10 Essential (primary) hypertension: Secondary | ICD-10-CM | POA: Diagnosis not present

## 2020-08-18 DIAGNOSIS — F32A Depression, unspecified: Secondary | ICD-10-CM | POA: Diagnosis present

## 2020-08-18 DIAGNOSIS — R627 Adult failure to thrive: Secondary | ICD-10-CM

## 2020-08-18 DIAGNOSIS — R636 Underweight: Secondary | ICD-10-CM

## 2020-08-18 DIAGNOSIS — Z885 Allergy status to narcotic agent status: Secondary | ICD-10-CM

## 2020-08-18 DIAGNOSIS — Z8249 Family history of ischemic heart disease and other diseases of the circulatory system: Secondary | ICD-10-CM | POA: Diagnosis not present

## 2020-08-18 DIAGNOSIS — Z79899 Other long term (current) drug therapy: Secondary | ICD-10-CM

## 2020-08-18 DIAGNOSIS — R404 Transient alteration of awareness: Secondary | ICD-10-CM | POA: Diagnosis not present

## 2020-08-18 DIAGNOSIS — F0280 Dementia in other diseases classified elsewhere without behavioral disturbance: Secondary | ICD-10-CM | POA: Diagnosis present

## 2020-08-18 DIAGNOSIS — G9340 Encephalopathy, unspecified: Secondary | ICD-10-CM | POA: Diagnosis not present

## 2020-08-18 DIAGNOSIS — M4856XA Collapsed vertebra, not elsewhere classified, lumbar region, initial encounter for fracture: Secondary | ICD-10-CM | POA: Diagnosis not present

## 2020-08-18 DIAGNOSIS — D696 Thrombocytopenia, unspecified: Secondary | ICD-10-CM | POA: Diagnosis present

## 2020-08-18 DIAGNOSIS — M4854XA Collapsed vertebra, not elsewhere classified, thoracic region, initial encounter for fracture: Secondary | ICD-10-CM | POA: Diagnosis present

## 2020-08-18 DIAGNOSIS — R4182 Altered mental status, unspecified: Secondary | ICD-10-CM | POA: Diagnosis present

## 2020-08-18 DIAGNOSIS — R410 Disorientation, unspecified: Secondary | ICD-10-CM

## 2020-08-18 DIAGNOSIS — Z888 Allergy status to other drugs, medicaments and biological substances status: Secondary | ICD-10-CM | POA: Diagnosis not present

## 2020-08-18 DIAGNOSIS — R531 Weakness: Secondary | ICD-10-CM | POA: Diagnosis not present

## 2020-08-18 DIAGNOSIS — I700 Atherosclerosis of aorta: Secondary | ICD-10-CM | POA: Diagnosis not present

## 2020-08-18 DIAGNOSIS — G309 Alzheimer's disease, unspecified: Principal | ICD-10-CM | POA: Diagnosis present

## 2020-08-18 DIAGNOSIS — Z7401 Bed confinement status: Secondary | ICD-10-CM | POA: Diagnosis not present

## 2020-08-18 LAB — COMPREHENSIVE METABOLIC PANEL
ALT: 18 U/L (ref 0–44)
AST: 25 U/L (ref 15–41)
Albumin: 3.7 g/dL (ref 3.5–5.0)
Alkaline Phosphatase: 91 U/L (ref 38–126)
Anion gap: 6 (ref 5–15)
BUN: 17 mg/dL (ref 8–23)
CO2: 28 mmol/L (ref 22–32)
Calcium: 8.8 mg/dL — ABNORMAL LOW (ref 8.9–10.3)
Chloride: 104 mmol/L (ref 98–111)
Creatinine, Ser: 0.5 mg/dL (ref 0.44–1.00)
GFR, Estimated: >60 mL/min (ref >60)
Glucose, Bld: 87 mg/dL (ref 70–99)
Potassium: 3.6 mmol/L (ref 3.5–5.1)
Sodium: 138 mmol/L (ref 135–145)
Total Bilirubin: 0.9 mg/dL (ref 0.3–1.2)
Total Protein: 6.3 g/dL — ABNORMAL LOW (ref 6.5–8.1)

## 2020-08-18 LAB — CBC WITH DIFFERENTIAL/PLATELET
Abs Immature Granulocytes: 0.01 10*3/uL (ref 0.00–0.07)
Basophils Absolute: 0 10*3/uL (ref 0.0–0.1)
Basophils Relative: 1 %
Eosinophils Absolute: 0.1 10*3/uL (ref 0.0–0.5)
Eosinophils Relative: 2 %
HCT: 40.8 % (ref 36.0–46.0)
Hemoglobin: 13.1 g/dL (ref 12.0–15.0)
Immature Granulocytes: 0 %
Lymphocytes Relative: 30 %
Lymphs Abs: 1.7 10*3/uL (ref 0.7–4.0)
MCH: 30.5 pg (ref 26.0–34.0)
MCHC: 32.1 g/dL (ref 30.0–36.0)
MCV: 94.9 fL (ref 80.0–100.0)
Monocytes Absolute: 0.7 10*3/uL (ref 0.1–1.0)
Monocytes Relative: 12 %
Neutro Abs: 3.1 10*3/uL (ref 1.7–7.7)
Neutrophils Relative %: 55 %
Platelets: 135 10*3/uL — ABNORMAL LOW (ref 150–400)
RBC: 4.3 MIL/uL (ref 3.87–5.11)
RDW: 13.1 % (ref 11.5–15.5)
WBC: 5.6 10*3/uL (ref 4.0–10.5)
nRBC: 0 % (ref 0.0–0.2)

## 2020-08-18 LAB — RESP PANEL BY RT-PCR (FLU A&B, COVID) ARPGX2
Influenza A by PCR: NEGATIVE
Influenza B by PCR: NEGATIVE
SARS Coronavirus 2 by RT PCR: NEGATIVE

## 2020-08-18 LAB — URINALYSIS, ROUTINE W REFLEX MICROSCOPIC
Bilirubin Urine: NEGATIVE
Glucose, UA: NEGATIVE mg/dL
Hgb urine dipstick: NEGATIVE
Ketones, ur: 5 mg/dL — AB
Leukocytes,Ua: NEGATIVE
Nitrite: NEGATIVE
Protein, ur: NEGATIVE mg/dL
Specific Gravity, Urine: 1.009 (ref 1.005–1.030)
pH: 7 (ref 5.0–8.0)

## 2020-08-18 LAB — RAPID URINE DRUG SCREEN, HOSP PERFORMED
Amphetamines: NOT DETECTED
Barbiturates: NOT DETECTED
Benzodiazepines: NOT DETECTED
Cocaine: NOT DETECTED
Opiates: NOT DETECTED
Tetrahydrocannabinol: NOT DETECTED

## 2020-08-18 LAB — LACTIC ACID, PLASMA: Lactic Acid, Venous: 0.8 mmol/L (ref 0.5–1.9)

## 2020-08-18 LAB — TROPONIN I (HIGH SENSITIVITY): Troponin I (High Sensitivity): 5 ng/L (ref <18)

## 2020-08-18 LAB — CBG MONITORING, ED: Glucose-Capillary: 72 mg/dL (ref 70–99)

## 2020-08-18 LAB — LIPASE, BLOOD: Lipase: 34 U/L (ref 11–51)

## 2020-08-18 MED ORDER — IOHEXOL 350 MG/ML SOLN
75.0000 mL | Freq: Once | INTRAVENOUS | Status: AC | PRN
  Administered 2020-08-18: 75 mL via INTRAVENOUS

## 2020-08-18 MED ORDER — ACETAMINOPHEN 325 MG PO TABS: 650.0000 mg | ORAL_TABLET | Freq: Four times a day (QID) | ORAL | Status: DC | PRN

## 2020-08-18 MED ORDER — SODIUM CHLORIDE 0.9 % IV SOLN: INTRAVENOUS | Status: DC

## 2020-08-18 MED ORDER — ENSURE ENLIVE PO LIQD
237.0000 mL | Freq: Two times a day (BID) | ORAL | Status: DC
  Administered 2020-08-19: 237 mL via ORAL
  Filled 2020-08-18: qty 237

## 2020-08-18 MED ORDER — SODIUM CHLORIDE (PF) 0.9 % IJ SOLN
INTRAMUSCULAR | Status: AC
  Filled 2020-08-18: qty 50

## 2020-08-18 NOTE — ED Notes (Signed)
Patient transported to CT 

## 2020-08-18 NOTE — ED Triage Notes (Signed)
Pt BIB [facility] from [facility] [place]. Pt has a hx of dementia but had a sudden onset of altered mental status and lethargy. Pt started taking mirtazapine 5 days ago and started to become more fatigue along with visual hallucinations such as seeing dead relatives. Pt slept the entire ride here.  Vitals were:  170/82 60-HR 16- RR 96% on RA 108-CBG

## 2020-08-18 NOTE — H&P (Signed)
History and Physical  Tanya Walker JEH:[id number] DOB: 05-27-1940 DOA: 08/18/2020  Referring physician: Arnaldo Natal, MD  PCP: Cassandria Anger, MD  Patient coming from: Assisted living facility ([facility] [place])  Chief Complaint: Altered mental status  HPI: Tanya Walker is a 80 y.o. female with medical history significant for dyslipidemia, compression fracture (T11-T12) s/p kyphoplasty, dementia recent weight loss due to decreased oral intake who presents to the emergency department via EMS from home [facility] due to sudden onset of altered mental status and lethargy.  Apparently, patient  started being more fatigued since she was started on mirtazapine about 5 days ago.  She now presents with visual hallucinations such as seeing dead relatives, so EMS was activated and patient was taken to the ED for further evaluation.  At bedside, patient states that she came to the ED to seek for help, when asked what her concern was,  she states that she was fine and has no complaints.  She was alert to person, place and time.  She denies chest pain, abdominal pain, fever, chills, shortness of breath.  ED Course:  In the emergency department, BP was elevated at 185/94, but other vital signs were within normal range.  Work-up in the ED showed normal CBC except for thrombocytopenia and normal BMP.  Influenza A, B, SARS coronavirus 2 was negative.  Urinalysis was unimpressive for UTI, lactic acid 0.8, lipase 34, troponin x1- 5. CT of head without contrast showed no acute intracranial pathology CT abdomen and pelvis with contrast showed T11 and L1 compression fractures which are new since the Threet/10/22 thoracic spine CT. Chest x-ray showed no acute abnormality IV hydration was provided.  Hospitalist was asked to admit patient for further evaluation and management.   Review of Systems: Constitutional: Positive for appetite change, decreased appetite and weight loss.  Negative for chills and  fever.  HENT: Negative for ear pain and sore throat.   Eyes: Negative for pain and visual disturbance.  Respiratory: Negative for cough, chest tightness and shortness of breath.   Cardiovascular: Negative for chest pain and palpitations.  Gastrointestinal: Negative for abdominal pain and vomiting.  Endocrine: Negative for polyphagia and polyuria.  Genitourinary: Negative for decreased urine volume, dysuria, enuresis Musculoskeletal: Negative for arthralgias and back pain.  Skin: Negative for color change and rash.  Allergic/Immunologic: Negative for immunocompromised state.  Neurological: Positive for weakness.  Negative for tremors, syncope, speech difficulty Hematological: Does not bruise/bleed easily.  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   Depression    years ago   Headache    sinus headaches   Lipoma of arm    Right   Medical history non-contributory    Osteoporosis    SVD (spontaneous vaginal delivery)    x 2   Varicose vein    Vitamin D deficiency    Past Surgical History:  Procedure Laterality Date   [place]     x2 left and right arm   HYSTEROSCOPY WITH D & C N/A 07/15/2013   Procedure: DILATATION AND CURETTAGE /HYSTEROSCOPY;  Surgeon: Lovenia Kim, MD;  Location: [facility];  Service: Gynecology;  Laterality: N/A;   KYPHOPLASTY Bilateral 05/04/2020   Procedure: KYPHOPLASTY THORACIC TWELVE;  Surgeon: Vallarie Mare, MD;  Location: [facility];  Service: Neurosurgery;  Laterality: Bilateral;   MANDIBLE SURGERY  1954   WISDOM TOOTH EXTRACTION      Social History:  reports that she has never smoked. She has never used smokeless tobacco. She reports that she does not drink alcohol and does not use drugs.   Allergies  Allergen Reactions   Keflex [Cephalexin] Other (See Comments)     "Allergic," per MAR   Actonel [Risedronate] Other (See Comments)    "Allergic," per MAR   Alendronate Sodium Other (See Comments)    Leg cramps and "Allergic," per MAR   Risedronate Sodium Other (See Comments)    Caused the patient to be achy and is "Allergic," per MAR   Tramadol Other (See Comments)    Caused the patient to feel badly     Family History  Problem Relation Age of Onset   Hypertension Mother    Heart disease Father    Cancer Father      Prior to Admission medications   Medication Sig Start Date End Date Taking? Authorizing Provider  acetaminophen (TYLENOL) 500 MG tablet Take 1,000 mg by mouth every 6 (six) hours as needed for moderate pain.    [provider]  buprenorphine (BUTRANS) 5 MCG/HR [place] 4 patches onto the skin once a week. 06/29/20   Plotnikov, [name], MD  cetirizine (ZYRTEC ALLERGY) 10 MG tablet Take 1 tablet (10 mg total) by mouth daily. 10/28/19 10/27/20  Plotnikov, [name], MD  Cholecalciferol (VITAMIN D3) 2000 units capsule Take 1 capsule (2,000 Units total) by mouth daily. 12/05/16   Plotnikov, [name], MD  gabapentin (NEURONTIN) 100 MG capsule Take 1 capsule (100 mg total) by mouth 3 (three) times daily. 06/08/20   Plotnikov, [name], MD  lactose free nutrition (BOOST) LIQD Take 237 mLs by mouth 2 (two) times daily between meals.    [provider]  Multiple Vitamins-Minerals (PRESERVISION AREDS 2 PO) Take 1 tablet by mouth 2 (two) times daily.    [provider]  Omega-3 Fatty Acids (SUPER OMEGA-3) 1000 MG CAPS Super Omega-3    [provider]  Romosozumab-aqqg (EVENITY) 105 MG/1.17ML SOSY injection Inject 210 mg into the skin every 30 (thirty) days. 06/08/20   Plotnikov, [name], MD    Physical Exam: BP (!) 165/92   Pulse 70   Temp 97.9 F (36.6 C) (Oral)   Resp 16   Ht 5\' 4"  (1.626 m)   Wt 40.2 kg   SpO2 96%   BMI 15.21 kg/m   General: 80 y.o. year-old female cachectic in no acute  distress.  Alert and oriented x3. HEENT: NCAT, EOMI Neck: Supple, trachea medial Cardiovascular: Regular rate and rhythm with no rubs or gallops.  No thyromegaly or JVD noted.  No lower extremity edema. 2/4 pulses in all 4 extremities. Respiratory: Clear to auscultation with no wheezes or rales. Good inspiratory effort. Abdomen: Soft, nontender,  nondistended with normal bowel sounds x4 quadrants. Muskuloskeletal: No cyanosis, clubbing or edema noted bilaterally Neuro: CN II-XII intact,  sensation, reflexes intact.  No focal neurologic deficit Skin: No ulcerative lesions noted or rashes Psychiatry: Mood is appropriate for condition and setting          Labs on Admission:  Basic Metabolic Panel: Recent Labs  Lab 08/18/20 1622  NA 138  K 3.6  CL 104  CO2 28  GLUCOSE 87  BUN 17  CREATININE 0.50  CALCIUM 8.8*  Liver Function Tests: Recent Labs  Lab 08/18/20 1622  AST 25  ALT 18  ALKPHOS 91  BILITOT 0.9  PROT 6.3*  ALBUMIN 3.7   Recent Labs  Lab 08/18/20 1622  LIPASE 34   No results for input(s): AMMONIA in the last 168 hours. CBC: Recent Labs  Lab 08/18/20 1622  WBC 5.6  NEUTROABS 3.1  HGB 13.1  HCT 40.8  MCV 94.9  PLT 135*   Cardiac Enzymes: No results for input(s): CKTOTAL, CKMB, CKMBINDEX, TROPONINI in the last 168 hours.  BNP (last 3 results) No results for input(s): BNP in the last 8760 hours.  ProBNP (last 3 results) No results for input(s): PROBNP in the last 8760 hours.  CBG: Recent Labs  Lab 08/18/20 1736  GLUCAP 72    Radiological Exams on Admission: CT Head Wo Contrast  Result Date: 08/18/2020 CLINICAL DATA:  80 year old female with altered mental status. EXAM: CT HEAD WITHOUT CONTRAST TECHNIQUE: Contiguous axial images were obtained from the base of the skull through the vertex without intravenous contrast. COMPARISON:  None. FINDINGS: Brain: Moderate age-related atrophy and chronic microvascular ischemic changes. Subcentimeter left  basal ganglia old lacunar infarct. There is no acute intracranial hemorrhage. No mass effect or midline shift. No extra-axial fluid collection. Vascular: No hyperdense vessel or unexpected calcification. Skull: Normal. Negative for fracture or focal lesion. Sinuses/Orbits: No acute finding. Other: None IMPRESSION: 1. No acute intracranial pathology. 2. Moderate age-related atrophy and chronic microvascular ischemic changes. Electronically Signed   By: Anner Crete M.D.   On: 08/18/2020 19:59   CT Abdomen Pelvis W Contrast  Result Date: 08/18/2020 CLINICAL DATA:  Altered mental status and lethargy. EXAM: CT ABDOMEN AND PELVIS WITH CONTRAST TECHNIQUE: Multidetector CT imaging of the abdomen and pelvis was performed using the standard protocol following bolus administration of intravenous contrast. CONTRAST:  75 mL OMNIPAQUE IOHEXOL 350 MG/ML SOLN COMPARISON:  CT thoracic spine 04/16/2020. FINDINGS: Lower chest: There is cardiomegaly. No pleural or pericardial effusion. Lung bases clear. Hepatobiliary: No focal liver abnormality is seen. Status post cholecystectomy. No biliary dilatation. Pancreas: Unremarkable. No pancreatic ductal dilatation or surrounding inflammatory changes. Spleen: Normal in size. No focal lesion. A few calcifications in the spleen are consistent with old granulomatous disease. Adrenals/Urinary Tract: Adrenal glands are unremarkable. Kidneys are normal, without renal calculi, focal lesion, or hydronephrosis. Bladder is distended but otherwise unremarkable. Stomach/Bowel: Stomach is within normal limits. Status post appendectomy. No evidence of bowel wall thickening, distention, or inflammatory changes. Vascular/Lymphatic: Aortic atherosclerosis. No enlarged abdominal or pelvic lymph nodes. Reproductive: Uterus and bilateral adnexa are unremarkable. Other: None. Musculoskeletal: The patient has T11, T12 and L1 compression fractures. The T11 and L1 fractures are new since the prior thoracic  spine CT. Vertebral body height loss at T11 is up to 80% anteriorly and there is approximately 60% vertebral body height loss at L1. Minimal retropulsion off the superior endplate of V37. Mild bony retropulsion is seen off the superior endplate of L1. Remote T12 compression fracture where the patient is status post vertebral augmentation noted. No other acute bony abnormality is seen. No lytic or sclerotic lesion. IMPRESSION: T11 and L1 compression fractures are new since the 04/16/2020 thoracic spine CT. Vertebral body height loss is worse at T11 where it is up to approximately 80%. There is mild bony retropulsion off the superior endplate of L1 which appears to cause mild central canal stenosis at T12-L1. Minimal retropulsion of seen off the superior endplate of T06 Status post vertebral augmentation at  L1 for compression fracture seen on the prior CT. Distended urinary bladder. Cause for this finding is not identified and may be incidental. Cardiomegaly. Aortic Atherosclerosis (ICD10-I70.0). Electronically Signed   By: Inge Rise M.D.   On: 08/18/2020 20:03   DG Chest Port 1 View  Result Date: 08/18/2020 CLINICAL DATA:  Diffuse weakness EXAM: PORTABLE CHEST 1 VIEW COMPARISON:  None. FINDINGS: Cardiac shadow is within normal limits. Lungs are well aerated bilaterally. No focal infiltrate or sizable effusion is noted. Aortic calcifications are seen. No bony abnormality is noted. IMPRESSION: No acute abnormality noted. Electronically Signed   By: Inez Catalina M.D.   On: 08/18/2020 18:07    EKG: I independently viewed the EKG done and my findings are as followed: Sinus rhythm at rate of 75 bpm with prolonged QTc (499 ms)  Assessment/Plan Present on Admission:  Altered mental status  Compression fracture of body of thoracic vertebra (HCC)  Principal Problem:   Altered mental status Active Problems:   Compression fracture of body of thoracic vertebra (HCC)   Prolonged QT interval    Thrombocytopenia (HCC)   Failure to thrive in adult   Physical deconditioning   Altered mental status (improving) Patient's mental status appeared to have improved since arrival to the ED, at bedside, she was alert to person, place and time at bedside No obvious sign of hallucination at bedside at this time Apparently she was recently started on Remeron; this will be held at this time Continue fall precaution and neurochecks  Prolonged QTc (499 ms) Avoid QT prolonging drugs Magnesium level will be checked Repeat EKG in the morning Remeron and Keppra will be held as indicated above  Failure to thrive in an adult Physical deconditioning Protein supplement to be provided Dietitian will be consulted and we shall await further recommendation  Thrombocytopenia possibly reactive Platelets 135; continue to monitor platelet levels  Compression fracture CT abdomen and pelvis with contrast showed T11 and L1 compression fractures which are new since the Threet/10/22 thoracic spine CT. Continue Tylenol 650 mg every 6 hours as needed  Continue gabapentin per home regimen Continue fall precautions and neurochecks Continue PT/OT eval and treat  Other the home meds: Vitamin D  DVT prophylaxis: Lovenox  Code Status: Full code  Family Communication: None at bedside  Disposition Plan:  Patient is from:                        home Anticipated DC to:                   SNF or family members home Anticipated DC date:               2-3 days Anticipated DC barriers:          Patient requires inpatient management due to altered mental status  Consults called: Dietitian  Admission status: Observation    Bernadette Hoit MD [facility]  08/18/2020, 10:23 PM

## 2020-08-18 NOTE — ED Notes (Signed)
Carren Blakley husband of patient would like an update on his wifes status [phone]

## 2020-08-18 NOTE — ED Provider Notes (Signed)
Tanya Walker   CSN: [id number] Arrival date & time: 08/18/20  1552     History Chief Complaint  Patient presents with   Altered Mental Status    Tanya Walker is a 80 y.o. female.  Tanya Walker portably was sent to the ED for altered mental status.  She has had rapid weight loss over the past several months.  She has also endorsed depression.  After a recent primary care visit, she was prescribed Remeron.  Since starting this medication, she has been increasingly fatigued.  Today, staff at her assisted living reported sudden decreased mental status and lethargy.  Tanya Walker is able to provide history, and she told me that she was sent here by accident.  When asked review of systems, she was positive for multiple review of systems, and further questioning about each of these complaints revealed some of them it had just started after I asked them.  Therefore, I am unsure how reliable her history is.  The history is provided by the patient and the EMS personnel.  Altered Mental Status Presenting symptoms: lethargy   Severity:  Severe Most recent episode:  Today Episode history:  Multiple Duration:  5 days Timing:  Intermittent Progression:  Worsening Chronicity:  New Context comment:  Recently started remeron for appetite Associated symptoms: abdominal pain, decreased appetite, depression, hallucinations, headaches (left side; just started) and weakness   Associated symptoms: no bladder incontinence, no difficulty breathing, no fever, no nausea, no palpitations, no rash, no seizures, no slurred speech and no vomiting       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   Depression    years ago   Headache    sinus headaches   Lipoma of arm    Right   Medical history non-contributory    Osteoporosis    SVD (spontaneous vaginal delivery)    x 2   Varicose vein    Vitamin  D deficiency     Patient Active Problem List   Diagnosis Date Noted   Compression fracture of body of thoracic vertebra ([place]) 06/08/2020   Low back pain 03/27/2020   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   Pain of left hand 02/14/2019   Tibialis posterior tendinitis, right 07/08/2016   Ankle sprain 06/30/2016   Peripheral vascular disease ([place]) 04/03/2016   Ankle pain, left 03/31/2016   Sore in nose 03/31/2016   Well adult exam 09/25/2015   Adjustment disorder with mixed anxiety and depressed mood 02/03/2015   Allergic rhinitis 05/29/2014   Rash 06/10/2013   URI (upper respiratory infection) 03/08/2012   Lipoma 11/04/2009   PARESTHESIA 12/26/2008   Acute maxillary sinusitis 11/22/2007   Weight loss, abnormal 08/24/2007   Vitamin D deficiency 02/14/2007   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     Family History  Problem Relation Age of Onset   Hypertension Mother    Heart disease Father    Cancer Father     Social History   Tobacco Use   Smoking status: Never   Smokeless tobacco: Never  Vaping Use   Vaping Use: Never used  Substance Use Topics   Alcohol use: No   Drug use: No    Home Medications Prior to Admission medications   Medication Sig Start Date End Date Taking? Authorizing Provider  acetaminophen (TYLENOL)  500 MG tablet Take 1,000 mg by mouth every 6 (six) hours as needed for moderate pain.    [provider]  buprenorphine (BUTRANS) 5 MCG/HR [place] 4 patches onto the skin once a week. 06/29/20   Plotnikov, [name], MD  cetirizine (ZYRTEC ALLERGY) 10 MG tablet Take 1 tablet (10 mg total) by mouth daily. 10/28/19 10/27/20  Plotnikov, [name], MD  Cholecalciferol (VITAMIN D3) 2000 units capsule Take 1 capsule (2,000 Units total) by mouth daily. 12/05/16   Plotnikov, [name], MD  gabapentin (NEURONTIN) 100 MG capsule Take 1 capsule (100 mg total) by mouth 3 (three) times daily. 06/08/20   Plotnikov, [name], MD  lactose free nutrition (BOOST) LIQD Take 237 mLs by mouth 2 (two) times daily between meals.    [provider]  Multiple Vitamins-Minerals (PRESERVISION AREDS 2 PO) Take 1 tablet by mouth 2 (two) times daily.    [provider]  Omega-3 Fatty Acids (SUPER OMEGA-3) 1000 MG CAPS Super Omega-3    [provider]  Romosozumab-aqqg (EVENITY) 105 MG/1.17ML SOSY injection Inject 210 mg into the skin every 30 (thirty) days. 06/08/20   Plotnikov, [name], MD    Allergies    Keflex [cephalexin], Alendronate sodium, Alendronate sodium, Risedronate sodium, and Tramadol  Review of Systems   Review of Systems  Constitutional:  Positive for appetite change, decreased appetite and unexpected weight change. Negative for chills and fever.  HENT:  Positive for trouble swallowing. Negative for ear pain and sore throat.        Dysphagia for solids > liquids  Eyes:  Negative for pain and visual disturbance.  Respiratory:  Negative for cough and shortness of breath.   Cardiovascular:  Positive for chest pain. Negative for palpitations.  Gastrointestinal:  Positive for abdominal pain. Negative for nausea and vomiting.  Genitourinary:  Negative for bladder incontinence, dysuria and hematuria.  Musculoskeletal:  Negative for arthralgias and back pain.  Skin:  Negative  for color change and rash.  Neurological:  Positive for weakness and headaches (left side; just started). Negative for seizures and syncope.       Ambulates at baseline with rolling walker  Psychiatric/Behavioral:  Positive for dysphoric mood and hallucinations.   All other systems reviewed and are negative.  Physical Exam Updated Vital Signs BP (!) 185/94 (BP Location: Right Arm)   Pulse 66   Temp 97.9 F (36.6 C) (Oral)   Ht 5\' 4"  (1.626 m)   Wt 40.2 kg   SpO2 98%   BMI 15.21 kg/m   Physical Exam Vitals and nursing Walker reviewed.  Constitutional:      General: She is not in acute distress.    Appearance: She is cachectic.  HENT:     Head: Normocephalic and atraumatic.  Eyes:  Extraocular Movements:     Right eye: Normal extraocular motion.     Left eye: Normal extraocular motion.     Pupils: Pupils are equal, round, and reactive to light.  Cardiovascular:     Rate and Rhythm: Normal rate and regular rhythm.     Heart sounds: Normal heart sounds.  Pulmonary:     Effort: Pulmonary effort is normal. No tachypnea.     Breath sounds: Normal breath sounds.  Abdominal:     Palpations: Abdomen is soft.     Tenderness: There is abdominal tenderness in the right lower quadrant and suprapubic area.  Musculoskeletal:     Right lower leg: No edema.     Left lower leg: No edema.  Skin:    General: Skin is warm and dry.  Neurological:     General: No focal deficit present.     Mental Status: She is oriented to person, place, and time. She is lethargic.     Cranial Nerves: Cranial nerves are intact.     Sensory: Sensation is intact.     Motor: Weakness present.     Coordination: Coordination is intact.     Comments: I had to encourage her to participate in a neurologic exam.  She was trying to close her eyes during finger-nose testing.  However, when encouraged, she did open her eyes and was able to perform the exam.  She seemed to have moderate, diffuse weakness but could sit  while holding the bed rails.  She seemed to have very notable left, greater than right weakness for upper and lower extremities, but this was drift only.  She was able to sustain 10 and 5-second holds respectively.  Psychiatric:        Mood and Affect: Mood is depressed. Affect is flat.        Behavior: Behavior normal.        Thought Content: Thought content normal.        Cognition and Memory: Cognition normal.    ED Results / Procedures / Treatments   Labs (all labs ordered are listed, but only abnormal results are displayed) Labs Reviewed  CBC WITH DIFFERENTIAL/PLATELET - Abnormal; Notable for the following components:      Result Value   Platelets 135 (*)    All other components within normal limits  COMPREHENSIVE METABOLIC PANEL - Abnormal; Notable for the following components:   Calcium 8.8 (*)    Total Protein 6.3 (*)    All other components within normal limits  URINALYSIS, ROUTINE W REFLEX MICROSCOPIC - Abnormal; Notable for the following components:   Color, Urine STRAW (*)    Ketones, ur 5 (*)    All other components within normal limits  RESP PANEL BY RT-PCR (FLU A&B, COVID) [id number]  LACTIC ACID, PLASMA  LIPASE, BLOOD  LACTIC ACID, PLASMA  RAPID URINE DRUG SCREEN, HOSP PERFORMED  CBG MONITORING, ED  TROPONIN I (HIGH SENSITIVITY)  TROPONIN I (HIGH SENSITIVITY)    EKG EKG Interpretation  Date/Time:  Tuesday August 18 2020 17:24:34 EDT Ventricular Rate:  75 PR Interval:  142 QRS Duration: 99 QT Interval:  446 QTC Calculation: 499 R Axis:   81 Text Interpretation: Sinus rhythm Borderline right axis deviation Borderline prolonged QT interval No acute ischemia QT prolonged since prior from 2015 Confirmed by Tanya Walker ([id number]) on 08/18/2020 5:34:55 PM  Radiology DG Chest Port 1 View  Result Date: 08/18/2020 CLINICAL DATA:  Diffuse weakness EXAM: PORTABLE CHEST 1 VIEW COMPARISON:  None. FINDINGS:  Cardiac shadow is within normal limits. Lungs are well aerated  bilaterally. No focal infiltrate or sizable effusion is noted. Aortic calcifications are seen. No bony abnormality is noted. IMPRESSION: No acute abnormality noted. Electronically Signed   By: Inez Catalina M.D.   On: 08/18/2020 18:07    Procedures Procedures   Medications Ordered in ED Medications  0.9 %  sodium chloride infusion (has no administration in time range)    ED Course  I have reviewed the triage vital signs and the nursing notes.  Pertinent labs & imaging results that were available during my care of the patient were reviewed by me and considered in my medical decision making (see chart for details).  Clinical Course as of 08/18/20 2036  Tue Aug 18, 2020  2035 I spoke with [facility] who will admit the patient. [AW]    Clinical Course User Index [AW] Arnaldo Natal, MD   MDM Rules/Calculators/A&P                          Tanya Walker is with altered mental status.  She has a history of mild dementia and some depression, but she has never experienced hallucinations.  She has been sleepier and less responsive since starting mirtazapine.  However, hallucinations are a new feature.  She has had unexplained weight loss for over a month, and this appears to correlate to her lack of appetite.  Here in the ED she was noted to be hypertensive but otherwise had normal vital signs.  Exam was consistent with a very thin patient with some mild abdominal tenderness.  ED work-up was significant for a T11 compression fracture but no other significant or acute abnormalities.  I think this could be secondary to her new medication.  Psychiatric disturbance is a possibility.  Increased dementia is also another possibility.  However, it does not appear that she has had an acute stroke, metabolic abnormality, UTI, pneumonia. Final Clinical Impression(s) / ED Diagnoses Final diagnoses:  Delirium  Low body weight due to inadequate caloric intake  Compression fracture of body of thoracic vertebra [facility]     Rx / DC Orders ED Discharge Orders     None        Arnaldo Natal, MD 08/18/20 2039

## 2020-08-18 NOTE — ED Triage Notes (Signed)
Pt has 20g in left AC.

## 2020-08-19 DIAGNOSIS — Z85828 Personal history of other malignant neoplasm of skin: Secondary | ICD-10-CM | POA: Diagnosis not present

## 2020-08-19 DIAGNOSIS — Z20822 Contact with and (suspected) exposure to covid-19: Secondary | ICD-10-CM | POA: Diagnosis not present

## 2020-08-19 DIAGNOSIS — Z9049 Acquired absence of other specified parts of digestive tract: Secondary | ICD-10-CM | POA: Diagnosis not present

## 2020-08-19 DIAGNOSIS — M4856XA Collapsed vertebra, not elsewhere classified, lumbar region, initial encounter for fracture: Secondary | ICD-10-CM | POA: Diagnosis not present

## 2020-08-19 DIAGNOSIS — G309 Alzheimer's disease, unspecified: Secondary | ICD-10-CM | POA: Diagnosis not present

## 2020-08-19 DIAGNOSIS — I10 Essential (primary) hypertension: Secondary | ICD-10-CM | POA: Diagnosis not present

## 2020-08-19 DIAGNOSIS — Z7401 Bed confinement status: Secondary | ICD-10-CM | POA: Diagnosis not present

## 2020-08-19 DIAGNOSIS — E785 Hyperlipidemia, unspecified: Secondary | ICD-10-CM | POA: Diagnosis present

## 2020-08-19 DIAGNOSIS — R5381 Other malaise: Secondary | ICD-10-CM | POA: Diagnosis not present

## 2020-08-19 DIAGNOSIS — Z79899 Other long term (current) drug therapy: Secondary | ICD-10-CM | POA: Diagnosis not present

## 2020-08-19 DIAGNOSIS — R4182 Altered mental status, unspecified: Secondary | ICD-10-CM

## 2020-08-19 DIAGNOSIS — G9340 Encephalopathy, unspecified: Secondary | ICD-10-CM | POA: Diagnosis not present

## 2020-08-19 DIAGNOSIS — M4854XA Collapsed vertebra, not elsewhere classified, thoracic region, initial encounter for fracture: Secondary | ICD-10-CM | POA: Diagnosis not present

## 2020-08-19 DIAGNOSIS — E43 Unspecified severe protein-calorie malnutrition: Secondary | ICD-10-CM | POA: Diagnosis not present

## 2020-08-19 DIAGNOSIS — D696 Thrombocytopenia, unspecified: Secondary | ICD-10-CM | POA: Diagnosis not present

## 2020-08-19 DIAGNOSIS — Z8249 Family history of ischemic heart disease and other diseases of the circulatory system: Secondary | ICD-10-CM | POA: Diagnosis not present

## 2020-08-19 DIAGNOSIS — Z888 Allergy status to other drugs, medicaments and biological substances status: Secondary | ICD-10-CM | POA: Diagnosis not present

## 2020-08-19 DIAGNOSIS — R404 Transient alteration of awareness: Secondary | ICD-10-CM | POA: Diagnosis not present

## 2020-08-19 DIAGNOSIS — F0280 Dementia in other diseases classified elsewhere without behavioral disturbance: Secondary | ICD-10-CM | POA: Diagnosis not present

## 2020-08-19 DIAGNOSIS — R627 Adult failure to thrive: Secondary | ICD-10-CM

## 2020-08-19 DIAGNOSIS — E860 Dehydration: Secondary | ICD-10-CM | POA: Diagnosis not present

## 2020-08-19 DIAGNOSIS — Z885 Allergy status to narcotic agent status: Secondary | ICD-10-CM | POA: Diagnosis not present

## 2020-08-19 DIAGNOSIS — Z681 Body mass index (BMI) 19 or less, adult: Secondary | ICD-10-CM | POA: Diagnosis not present

## 2020-08-19 DIAGNOSIS — F32A Depression, unspecified: Secondary | ICD-10-CM | POA: Diagnosis not present

## 2020-08-19 DIAGNOSIS — R410 Disorientation, unspecified: Secondary | ICD-10-CM | POA: Diagnosis present

## 2020-08-19 DIAGNOSIS — R9431 Abnormal electrocardiogram [ECG] [EKG]: Secondary | ICD-10-CM | POA: Diagnosis present

## 2020-08-19 LAB — CBC
HCT: 43.1 % (ref 36.0–46.0)
Hemoglobin: 14 g/dL (ref 12.0–15.0)
MCH: 31 pg (ref 26.0–34.0)
MCHC: 32.5 g/dL (ref 30.0–36.0)
MCV: 95.4 fL (ref 80.0–100.0)
Platelets: 147 10*3/uL — ABNORMAL LOW (ref 150–400)
RBC: 4.52 MIL/uL (ref 3.87–5.11)
RDW: 12.9 % (ref 11.5–15.5)
WBC: 6.2 10*3/uL (ref 4.0–10.5)
nRBC: 0 % (ref 0.0–0.2)

## 2020-08-19 LAB — COMPREHENSIVE METABOLIC PANEL
ALT: 18 U/L (ref 0–44)
AST: 22 U/L (ref 15–41)
Albumin: 3.9 g/dL (ref 3.5–5.0)
Alkaline Phosphatase: 86 U/L (ref 38–126)
Anion gap: 9 (ref 5–15)
BUN: 10 mg/dL (ref 8–23)
CO2: 24 mmol/L (ref 22–32)
Calcium: 8.6 mg/dL — ABNORMAL LOW (ref 8.9–10.3)
Chloride: 104 mmol/L (ref 98–111)
Creatinine, Ser: <0.3 mg/dL — ABNORMAL LOW (ref 0.44–1.00)
Glucose, Bld: 80 mg/dL (ref 70–99)
Potassium: 3.6 mmol/L (ref 3.5–5.1)
Sodium: 137 mmol/L (ref 135–145)
Total Bilirubin: 1 mg/dL (ref 0.3–1.2)
Total Protein: 6.4 g/dL — ABNORMAL LOW (ref 6.5–8.1)

## 2020-08-19 LAB — MAGNESIUM
Magnesium: 2.1 mg/dL (ref 1.7–2.4)
Magnesium: 2.2 mg/dL (ref 1.7–2.4)

## 2020-08-19 LAB — PHOSPHORUS: Phosphorus: 3.1 mg/dL (ref 2.5–4.6)

## 2020-08-19 LAB — PROTIME-INR
INR: 1 (ref 0.8–1.2)
Prothrombin Time: 13.3 seconds (ref 11.4–15.2)

## 2020-08-19 LAB — LACTIC ACID, PLASMA: Lactic Acid, Venous: 0.6 mmol/L (ref 0.5–1.9)

## 2020-08-19 LAB — APTT: aPTT: 28 seconds (ref 24–36)

## 2020-08-19 LAB — TROPONIN I (HIGH SENSITIVITY): Troponin I (High Sensitivity): 9 ng/L (ref <18)

## 2020-08-19 MED ORDER — MELATONIN 5 MG PO TABS
10.0000 mg | ORAL_TABLET | Freq: Every day | ORAL | Status: DC
  Administered 2020-08-19: 10 mg via ORAL
  Filled 2020-08-19: qty 2

## 2020-08-19 MED ORDER — DOCUSATE SODIUM 100 MG PO CAPS
100.0000 mg | ORAL_CAPSULE | Freq: Every day | ORAL | Status: DC
  Administered 2020-08-19: 100 mg via ORAL
  Filled 2020-08-19: qty 1

## 2020-08-19 MED ORDER — GABAPENTIN 100 MG PO CAPS
100.0000 mg | ORAL_CAPSULE | Freq: Three times a day (TID) | ORAL | Status: DC
  Administered 2020-08-19 – 2020-08-20 (×4): 100 mg via ORAL
  Filled 2020-08-19 (×4): qty 1

## 2020-08-19 MED ORDER — ENOXAPARIN SODIUM 30 MG/0.3ML IJ SOSY
30.0000 mg | PREFILLED_SYRINGE | INTRAMUSCULAR | Status: DC
  Administered 2020-08-20: 30 mg via SUBCUTANEOUS
  Filled 2020-08-19 (×3): qty 0.3

## 2020-08-19 MED ORDER — MELATONIN 10 MG PO TABS: 10.0000 mg | ORAL_TABLET | Freq: Every day | ORAL | Status: DC

## 2020-08-19 MED ORDER — CAMPHOR-MENTHOL-METHYL SAL 3.1-6-10 % EX PTCH: 1.0000 | MEDICATED_PATCH | Freq: Every day | CUTANEOUS | Status: DC

## 2020-08-19 MED ORDER — VITAMIN B12-FOLIC ACID 500-400 MCG PO TABS: ORAL_TABLET | Freq: Every morning | ORAL | Status: DC

## 2020-08-19 MED ORDER — BOOST PLUS PO LIQD
237.0000 mL | Freq: Two times a day (BID) | ORAL | Status: DC
  Administered 2020-08-19 – 2020-08-20 (×2): 237 mL via ORAL
  Filled 2020-08-19 (×3): qty 237

## 2020-08-19 MED ORDER — ESCITALOPRAM OXALATE 10 MG PO TABS: 10.0000 mg | ORAL_TABLET | Freq: Every evening | ORAL | Status: DC

## 2020-08-19 MED ORDER — ADULT MULTIVITAMIN W/MINERALS CH
1.0000 | ORAL_TABLET | Freq: Every day | ORAL | Status: DC
  Administered 2020-08-20: 1 via ORAL
  Filled 2020-08-19: qty 1

## 2020-08-19 MED ORDER — VITAMIN D 25 MCG (1000 UNIT) PO TABS
2000.0000 [IU] | ORAL_TABLET | Freq: Every day | ORAL | Status: DC
  Administered 2020-08-19 – 2020-08-20 (×2): 2000 [IU] via ORAL
  Filled 2020-08-19 (×2): qty 2

## 2020-08-19 MED ORDER — VITAMIN B-12 1000 MCG PO TABS
500.0000 ug | ORAL_TABLET | Freq: Every day | ORAL | Status: DC
  Administered 2020-08-20: 500 ug via ORAL
  Filled 2020-08-19: qty 1

## 2020-08-19 MED ORDER — FOLIC ACID 1 MG PO TABS
0.5000 mg | ORAL_TABLET | Freq: Every day | ORAL | Status: DC
  Administered 2020-08-20: 0.5 mg via ORAL
  Filled 2020-08-19: qty 1

## 2020-08-19 NOTE — Evaluation (Signed)
Physical Therapy Evaluation Patient Details Name: Tanya Walker MRN: [id number] DOB: 08-06-1940 Today's Date: 08/19/2020   History of Present Illness  Patient is an 80 year old female admitted from ALF for AMS, lethargy. PMH includes dyslipidemia, compression fracture (T11-T12) s/p kyphoplasty, dementia recent weight loss due to decreased oral intake  Clinical Impression  Pt admitted with above diagnosis.  Pt in recliner on arrival to room and chair linen and gown saturated with urine.  Pt requested to return to bed and then once sitting on bed and gown changed, pt wished to return to recliner.  Pt declined ambulating multiple times.  Pt poor historian and hx of dementia so uncertain how much ALF assist provided but pt currently needing at least min assist for mobility due to weakness and poor balance.  Pt currently with functional limitations due to the deficits listed below (see PT Problem List). Pt will benefit from skilled PT to increase their independence and safety with mobility to allow discharge to the venue listed below.  If ALF cannot provide assist currently required, pt may need to d/c to SNF.     Follow Up Recommendations SNF    Equipment Recommendations  Rolling walker with 5" wheels    Recommendations for Other Services       Precautions / Restrictions Precautions Precautions: Fall Restrictions Weight Bearing Restrictions: No      Mobility  Bed Mobility Overal bed mobility: Needs Assistance Bed Mobility: Rolling;Sidelying to Sit Rolling: Supervision Sidelying to sit: Min guard       General bed mobility comments: cue patient in log roll technique, once seated up right keeps trying to lay herself back onto the bed needing cues to redirect    Transfers Overall transfer level: Needs assistance Equipment used: 1 person hand held assist Transfers: Sit to/from Stand Sit to Stand: Min assist         General transfer comment: pt cued to use UEs to self assist,  assist to rise and steady  Ambulation/Gait Ambulation/Gait assistance: Min assist Gait Distance (Feet): 3 Feet (x2) Assistive device: 1 person hand held assist Gait Pattern/deviations: Step-through pattern;Decreased stride length;Narrow base of support     General Gait Details: reliant on UE support however would not use RW so provided HHA, pt declined to ambulate multiple times so assisted over to bed to change linen in chair and gown (chair soaked with urine).  Pt refused washing off but did allow therapist to change her gown ("oh, it's cold") and nurse tech notified.  Pt wished to return to recliner so cleaned chair and provided new linen and then pt assisted witha few steps back over to recliner  Stairs            Wheelchair Mobility    Modified Rankin (Stroke Patients Only)       Balance Overall balance assessment: Needs assistance Sitting-balance support: Feet supported Sitting balance-Leahy Scale: Fair     Standing balance support: Single extremity supported Standing balance-Leahy Scale: Poor Standing balance comment: reliant on UE support/min A                             Pertinent Vitals/Pain Pain Assessment: No/denies pain    Home Living Family/patient expects to be discharged to:: Assisted living               Home Equipment: None      Prior Function Level of Independence: Independent  Comments: patient questionable historian, unable to recall what she does during the day at home     Hand Dominance   Dominant Hand: Right    Extremity/Trunk Assessment   Upper Extremity Assessment Upper Extremity Assessment: Generalized weakness    Lower Extremity Assessment Lower Extremity Assessment: Generalized weakness    Cervical / Trunk Assessment Cervical / Trunk Assessment: Kyphotic;Other exceptions Cervical / Trunk Exceptions: curvature of spine observed  Communication   Communication: No difficulties  Cognition  Arousal/Alertness: Awake/alert Behavior During Therapy: Flat affect Overall Cognitive Status: No family/caregiver present to determine baseline cognitive functioning                                 General Comments: hx dementia, orientated to [employer], decreased understanding of situation, stating opposites when asked questions (wanted to get back to bed and not change gown and then stated the opposite way)      General Comments      Exercises     Assessment/Plan    PT Assessment Patient needs continued PT services  PT Problem List Decreased strength;Decreased mobility;Decreased activity tolerance;Decreased knowledge of use of DME;Decreased balance       PT Treatment Interventions DME instruction;Gait training;Balance training;Therapeutic exercise;Functional mobility training;Therapeutic activities;Patient/family education    PT Goals (Current goals can be found in the Care Plan section)  Acute Rehab PT Goals Patient Stated Goal: "I'm cold" PT Goal Formulation: Patient unable to participate in goal setting Time For Goal Achievement: 09/02/20 Potential to Achieve Goals: Fair    Frequency Min 2X/week   Barriers to discharge        Co-evaluation               AM-PAC PT "6 Clicks" Mobility  Outcome Measure Help needed turning from your back to your side while in a flat bed without using bedrails?: A Little Help needed moving from lying on your back to sitting on the side of a flat bed without using bedrails?: A Little Help needed moving to and from a bed to a chair (including a wheelchair)?: A Little Help needed standing up from a chair using your arms (e.g., wheelchair or bedside chair)?: A Little Help needed to walk in hospital room?: A Little Help needed climbing 3-5 steps with a railing? : A Lot 6 Click Score: 17    End of Session Equipment Utilized During Treatment: Gait belt Activity Tolerance: Patient tolerated treatment well Patient left:  in chair;with call bell/phone within reach;with chair alarm set Nurse Communication: Mobility status PT Visit Diagnosis: Difficulty in walking, not elsewhere classified (R26.2)    Time: 0347-4259 PT Time Calculation (min) (ACUTE ONLY): 20 min   Charges:   PT Evaluation $PT Eval Low Complexity: 1 Low     Kati PT, DPT Acute Rehabilitation Services Pager: [phone] Office: [place] 08/19/2020, 12:00 PM

## 2020-08-19 NOTE — Progress Notes (Signed)
In chair PROGRESS NOTE    CHAKARA BOGNAR  KGU:[id number] DOB: Mar 06, 1940 DOA: 08/18/2020 PCP: Cassandria Anger, MD    Brief Narrative:  Patient is 80 year old female with history of hyperlipidemia, back problems, and recent weight loss decreased oral intake brought to the emergency room from assisted living facility due to sudden onset of altered mental status and lethargy.  Patient is poor historian.  She is just focused on her back.  Apparently, for her poor appetite she was started on mirtazapine about 5 days ago.  In the emergency room blood pressure 185/94.  Otherwise stable.  Most of the serological test negative.  CT head normal.  CT scan abdomen pelvis was normal except showing a stable T11 and L1 compression fractures.  Chest x-ray was normal.  COVID-19 and influenza swab was negative.  For unknown diagnosis, she was advised to be observed in the hospital.  Given IV fluids overnight.   Assessment & Plan:   Principal Problem:   Altered mental status Active Problems:   Compression fracture of body of thoracic vertebra (HCC)   Prolonged QT interval   Thrombocytopenia (HCC)   Failure to thrive in adult   Physical deconditioning   Protein-calorie malnutrition, severe  Altered mental status: Unable to find any acute causes for altered mentation.  Mental status seems to be improving after admission to the hospital.  Recently started on Remeron that we will discontinue. 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 Symptomatic treatment, continue Tylenol, gabapentin, melatonin at night.  Start citalopram that she takes at home. All-time fall precautions.  Delirium precautions. Work with PT OT.  Will need to reevaluate whether she is safe to be taken care at assisted living facility.  Vertebral compression fractures: Multilevel compression fractures.  PT OT and pain relief with Tylenol.  Failure to thrive/physical  deconditioning/mild clinical dehydration: Nutrition to see and evaluate.  Supplemental nutrition as possible. Work with PT OT. Palliative care consultation for education, counseling and goal of care.   DVT prophylaxis: enoxaparin (LOVENOX) injection 30 mg Start: 08/19/20 1000 SCDs Start: 08/19/20 0322   Code Status: Full code Family Communication: Called patient's husband on the number provided, unable to talk. Disposition Plan: Status is: Observation  The patient will require care spanning > 2 midnights and should be moved to inpatient because: Unsafe d/c plan  Dispo: The patient is from: ALF              Anticipated d/c is to: SNF              Patient currently is medically stable to d/c.   Difficult to place patient No         Consultants:  None  Procedures:  None  Antimicrobials:  None   Subjective: Patient was seen and examined.  She was sitting in chair and looks comfortable.  When I started interview, she got irritable and she is focused on her back surgery.  She thinks she is here in the hospital because of complication of her back surgery, no evidence of recent surgical interventions. Overall poor historian. She knows she is from assisted living facility, she does not know she is in the hospital. I tried to call her husband on the number to know about her baseline mental status and level of dementia, however unable to find any family members.  Objective: Vitals:   08/19/20 0200 08/19/20 0317 08/19/20 0729 08/19/20 1029  BP: 117/71 (!) 152/85 (!) 165/83 115/72  Pulse: 63 69 67 88  Resp: 14 16 18 18   Temp:  97.8 F (36.6 C)    TempSrc:  Oral    SpO2: 98% 98% 98% 91%  Weight:      Height:        Intake/Output Summary (Last 24 hours) at 08/19/2020 1310 Last data filed at 08/19/2020 1000 Gross per 24 hour  Intake 1315.2 ml  Output 350 ml  Net 965.2 ml   Filed Weights   08/18/20 1607  Weight: 40.2 kg    Examination:  General exam: Appears calm  and comfortable  Alert and awake and oriented x1-2.  Slightly irritable on interview. Respiratory system: Clear to auscultation. Respiratory effort normal. Cardiovascular system: S1 & S2 heard, RRR. No JVD, murmurs, rubs, gallops or clicks. No pedal edema. Gastrointestinal system: Abdomen is nondistended, soft and nontender. No organomegaly or masses felt. Normal bowel sounds heard. Central nervous system: No focal neurological deficits.    Data Reviewed: I have personally reviewed following labs and imaging studies  CBC: Recent Labs  Lab 08/18/20 1622 08/19/20 0429  WBC 5.6 6.2  NEUTROABS 3.1  --   HGB 13.1 14.0  HCT 40.8 43.1  MCV 94.9 95.4  PLT 135* 161*   Basic Metabolic Panel: Recent Labs  Lab 08/18/20 1622 08/18/20 2330 08/19/20 0429  NA 138  --  137  K 3.6  --  3.6  CL 104  --  104  CO2 28  --  24  GLUCOSE 87  --  80  BUN 17  --  10  CREATININE 0.50  --  <0.30*  CALCIUM 8.8*  --  8.6*  MG  --  2.2 2.1  PHOS  --   --  3.1   GFR: CrCl cannot be calculated (This lab value cannot be used to calculate CrCl because it is not a number: <0.30). Liver Function Tests: Recent Labs  Lab 08/18/20 1622 08/19/20 0429  AST 25 22  ALT 18 18  ALKPHOS 91 86  BILITOT 0.9 1.0  PROT 6.3* 6.4*  ALBUMIN 3.7 3.9   Recent Labs  Lab 08/18/20 1622  LIPASE 34   No results for input(s): AMMONIA in the last 168 hours. Coagulation Profile: Recent Labs  Lab 08/19/20 0429  INR 1.0   Cardiac Enzymes: No results for input(s): CKTOTAL, CKMB, CKMBINDEX, TROPONINI in the last 168 hours. BNP (last 3 results) No results for input(s): PROBNP in the last 8760 hours. HbA1C: No results for input(s): HGBA1C in the last 72 hours. CBG: Recent Labs  Lab 08/18/20 1736  GLUCAP 72   Lipid Profile: No results for input(s): CHOL, HDL, LDLCALC, TRIG, CHOLHDL, LDLDIRECT in the last 72 hours. Thyroid Function Tests: No results for input(s): TSH, T4TOTAL, FREET4, T3FREE, THYROIDAB in  the last 72 hours. Anemia Panel: No results for input(s): VITAMINB12, FOLATE, FERRITIN, TIBC, IRON, RETICCTPCT in the last 72 hours. Sepsis Labs: Recent Labs  Lab 08/18/20 1624 08/18/20 2330  LATICACIDVEN 0.8 0.6    Recent Results (from the past 240 hour(s))  Resp Panel by RT-PCR (Flu A&B, Covid) Nasopharyngeal Swab     Status: None   Collection Time: 08/18/20  4:24 PM   Specimen: Nasopharyngeal Swab; Nasopharyngeal(NP) swabs in vial transport medium  Result Value Ref Range Status   SARS Coronavirus 2 by RT PCR NEGATIVE NEGATIVE Final    Comment: (NOTE) SARS-CoV-2 target nucleic acids are NOT DETECTED.  The SARS-CoV-2 RNA is generally detectable in upper respiratory specimens during the acute phase  of infection. The lowest concentration of SARS-CoV-2 viral copies this assay can detect is 138 copies/mL. A negative result does not preclude SARS-Cov-2 infection and should not be used as the sole basis for treatment or other patient management decisions. A negative result may occur with  improper specimen collection/handling, submission of specimen other than nasopharyngeal swab, presence of viral mutation(s) within the areas targeted by this assay, and inadequate number of viral copies(<138 copies/mL). A negative result must be combined with clinical observations, patient history, and epidemiological information. The expected result is Negative.  Fact Sheet for Patients:  EntrepreneurPulse.com.au  Fact Sheet for Healthcare Providers:  IncredibleEmployment.be  This test is no t yet approved or cleared by the [place] FDA and  has been authorized for detection and/or diagnosis of SARS-CoV-2 by FDA under an Emergency Use Authorization (EUA). This EUA will remain  in effect (meaning this test can be used) for the duration of the COVID-19 declaration under Section 564(b)(1) of the Act, 21 U.S.C.section 360bbb-3(b)(1), unless the  authorization is terminated  or revoked sooner.       Influenza A by PCR NEGATIVE NEGATIVE Final   Influenza B by PCR NEGATIVE NEGATIVE Final    Comment: (NOTE) The Xpert Xpress SARS-CoV-2/FLU/RSV plus assay is intended as an aid in the diagnosis of influenza from Nasopharyngeal swab specimens and should not be used as a sole basis for treatment. Nasal washings and aspirates are unacceptable for Xpert Xpress SARS-CoV-2/FLU/RSV testing.  Fact Sheet for Patients: EntrepreneurPulse.com.au  Fact Sheet for Healthcare Providers: IncredibleEmployment.be  This test is not yet approved or cleared by the [place] FDA and has been authorized for detection and/or diagnosis of SARS-CoV-2 by FDA under an Emergency Use Authorization (EUA). This EUA will remain in effect (meaning this test can be used) for the duration of the COVID-19 declaration under Section 564(b)(1) of the Act, 21 U.S.C. section 360bbb-3(b)(1), unless the authorization is terminated or revoked.  Performed at [facility], [place] [address] [place], [place] [address]          Radiology Studies: CT Head Wo Contrast  Result Date: 08/18/2020 CLINICAL DATA:  80 year old female with altered mental status. EXAM: CT HEAD WITHOUT CONTRAST TECHNIQUE: Contiguous axial images were obtained from the base of the skull through the vertex without intravenous contrast. COMPARISON:  None. FINDINGS: Brain: Moderate age-related atrophy and chronic microvascular ischemic changes. 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 There is no acute intracranial hemorrhage. No mass effect or midline shift. No extra-axial fluid collection. Vascular: No hyperdense vessel or unexpected calcification. Skull: Normal. Negative for fracture or focal lesion. Sinuses/Orbits: No acute finding. Other: None IMPRESSION: 1. No acute intracranial pathology. 2. Moderate age-related atrophy and chronic  microvascular ischemic changes. Electronically Signed   By: Anner Crete M.D.   On: 08/18/2020 19:59   CT Abdomen Pelvis W Contrast  Result Date: 08/18/2020 CLINICAL DATA:  Altered mental status and lethargy. EXAM: CT ABDOMEN AND PELVIS WITH CONTRAST TECHNIQUE: Multidetector CT imaging of the abdomen and pelvis was performed using the standard protocol following bolus administration of intravenous contrast. CONTRAST:  75 mL OMNIPAQUE IOHEXOL 350 MG/ML SOLN COMPARISON:  CT thoracic spine 04/16/2020. FINDINGS: Lower chest: There is cardiomegaly. No pleural or pericardial effusion. Lung bases clear. Hepatobiliary: No focal liver abnormality is seen. Status post cholecystectomy. No biliary dilatation. Pancreas: Unremarkable. No pancreatic ductal dilatation or surrounding inflammatory changes. Spleen: Normal in size. No focal lesion. A few calcifications in the spleen are consistent with old  granulomatous disease. Adrenals/Urinary Tract: Adrenal glands are unremarkable. Kidneys are normal, without renal calculi, focal lesion, or hydronephrosis. Bladder is distended but otherwise unremarkable. Stomach/Bowel: Stomach is within normal limits. Status post appendectomy. No evidence of bowel wall thickening, distention, or inflammatory changes. Vascular/Lymphatic: Aortic atherosclerosis. No enlarged abdominal or pelvic lymph nodes. Reproductive: Uterus and bilateral adnexa are unremarkable. Other: None. Musculoskeletal: The patient has T11, T12 and L1 compression fractures. The T11 and L1 fractures are new since the prior thoracic spine CT. Vertebral body height loss at T11 is up to 80% anteriorly and there is approximately 60% vertebral body height loss at L1. Minimal retropulsion off the superior endplate of F64. Mild bony retropulsion is seen off the superior endplate of L1. Remote T12 compression fracture where the patient is status post vertebral augmentation noted. No other acute bony abnormality is seen. No  lytic or sclerotic lesion. IMPRESSION: T11 and L1 compression fractures are new since the 04/16/2020 thoracic spine CT. Vertebral body height loss is worse at T11 where it is up to approximately 80%. There is mild bony retropulsion off the superior endplate of L1 which appears to cause mild central canal stenosis at T12-L1. Minimal retropulsion of seen off the superior endplate of P32 Status post vertebral augmentation at L1 for compression fracture seen on the prior CT. Distended urinary bladder. Cause for this finding is not identified and may be incidental. Cardiomegaly. Aortic Atherosclerosis (ICD10-I70.0). Electronically Signed   By: Inge Rise M.D.   On: 08/18/2020 20:03   DG Chest Port 1 View  Result Date: 08/18/2020 CLINICAL DATA:  Diffuse weakness EXAM: PORTABLE CHEST 1 VIEW COMPARISON:  None. FINDINGS: Cardiac shadow is within normal limits. Lungs are well aerated bilaterally. No focal infiltrate or sizable effusion is noted. Aortic calcifications are seen. No bony abnormality is noted. IMPRESSION: No acute abnormality noted. Electronically Signed   By: Inez Catalina M.D.   On: 08/18/2020 18:07        Scheduled Meds:  Camphor-Menthol-Methyl Sal  1 patch Apply externally See admin instructions   cholecalciferol  2,000 Units Oral Daily   docusate sodium  100 mg Oral QHS   enoxaparin (LOVENOX) injection  30 mg Subcutaneous Q24H   escitalopram  10 mg Oral QPM   gabapentin  100 mg Oral TID   lactose free nutrition  237 mL Oral BID BM   Melatonin  10 mg Oral QHS   multivitamin with minerals  1 tablet Oral Daily   [START ON 9/51/8841] Vitamin B12-Folic Acid   Sublingual q AM   Continuous Infusions:  sodium chloride 125 mL/hr at 08/19/20 1057     LOS: 0 days    Time spent: 25 minutes    Barb Merino, MD [facility] Pager [phone]

## 2020-08-19 NOTE — TOC Initial Note (Signed)
Transition of Care [facility]) - Initial/Assessment Note   Patient Details  Name: Tanya Walker MRN: [id number] Date of Birth: 05/03/1940  Transition of Care [facility]) CM/SW Contact:    Sherie Don, LCSW Phone Number: 08/19/2020, 3:52 PM  Clinical Narrative: CSW received call from [facility] with [place]. Patient will need to return to the facility under SNF rather than return to the ALF.  FL2 done; PASRR received. Initial referral and PT notes faxed to [facility] in hub. CSW attempted to call patient's husband, but was unable to reach him. CSW left VM requesting call back.  Expected Discharge Plan: Skilled Nursing Facility Barriers to Discharge: Continued Medical Work up  Patient Goals and CMS Choice Patient states their goals for this hospitalization and ongoing recovery are:: Return to [facility] for rehab CMS Medicare.gov Compare Post Acute Care list provided to:: Patient Represenative (must comment) Choice offered to / list presented to : Patient, Spouse  Expected Discharge Plan and Services Expected Discharge Plan: [place] In-house Referral: Clinical Social Work Post Acute Care Choice: [place] Living arrangements for the past 2 months: [place]              DME Arranged: N/A DME Agency: NA  Prior Living Arrangements/Services Living arrangements for the past 2 months: [place] Lives with:: Facility Resident Patient language and need for interpreter reviewed:: Yes Do you feel safe going back to the place where you live?: Yes      Need for Family Participation in Patient Care: Yes (Comment) Care giver support system in place?: Yes (comment) Criminal Activity/Legal Involvement Pertinent to Current Situation/Hospitalization: No - Comment as needed  Activities of Daily Living Home Assistive Devices/Equipment: Eyeglasses ADL Screening (condition at time of admission) Patient's cognitive ability  adequate to safely complete daily activities?: No Is the patient deaf or have difficulty hearing?: No Does the patient have difficulty seeing, even when wearing glasses/contacts?: No Does the patient have difficulty concentrating, remembering, or making decisions?: Yes Patient able to express need for assistance with ADLs?: Yes Does the patient have difficulty dressing or bathing?: Yes Independently performs ADLs?: No Communication: Independent Dressing (OT): Needs assistance Is this a change from baseline?: Pre-admission baseline Grooming: Independent Feeding: Independent Bathing: Needs assistance Is this a change from baseline?: Pre-admission baseline Toileting: Needs assistance Is this a change from baseline?: Pre-admission baseline In/Out Bed: Needs assistance Is this a change from baseline?: Pre-admission baseline Walks in Home: Needs assistance Is this a change from baseline?: Pre-admission baseline Does the patient have difficulty walking or climbing stairs?: Yes Weakness of Legs: Both Weakness of Arms/Hands: Both  Permission Sought/Granted Permission sought to share information with : Facility Art therapist granted to share information with : Yes, Verbal Permission Granted Permission granted to share info w AGENCY: [facility]  Emotional Assessment Attitude/Demeanor/Rapport: Unable to Assess Affect (typically observed): Unable to Assess Orientation: : Oriented to Self, Oriented to Place, Oriented to Situation Alcohol / Substance Use: Not Applicable Psych Involvement: No (comment)  Admission diagnosis:  Delirium [R41.0] Altered mental status [R41.82] Low body weight due to inadequate caloric intake [R63.6] Compression fracture of body of thoracic vertebra ([place]) [S22.000A] Patient Active Problem List   Diagnosis Date Noted   Protein-calorie malnutrition, severe 08/19/2020   Altered mental status 08/18/2020   Prolonged QT interval  08/18/2020   Thrombocytopenia ([place]) 08/18/2020   Failure to thrive in adult 08/18/2020   Physical deconditioning 08/18/2020   Compression  fracture of body of thoracic vertebra ([place]) 06/08/2020   Low back pain 03/27/2020   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   Pain of left hand 02/14/2019   Tibialis posterior tendinitis, right 07/08/2016   Ankle sprain 06/30/2016   Peripheral vascular disease ([place]) 04/03/2016   Ankle pain, left 03/31/2016   Sore in nose 03/31/2016   Well adult exam 09/25/2015   Adjustment disorder with mixed anxiety and depressed mood 02/03/2015   Allergic rhinitis 05/29/2014   Rash 06/10/2013   URI (upper respiratory infection) 03/08/2012   Lipoma 11/04/2009   PARESTHESIA 12/26/2008   Acute maxillary sinusitis 11/22/2007   Weight loss, abnormal 08/24/2007   Vitamin D deficiency 02/14/2007   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 [place] [address] Phone: [phone] Fax: [place], [place] [address] Phone: [phone] Fax: [phone]  Readmission Risk Interventions No flowsheet data found.

## 2020-08-19 NOTE — NC FL2 (Signed)
[place] LEVEL OF CARE SCREENING TOOL     IDENTIFICATION  Patient Name: Tanya Walker Birthdate: 1940/05/16 Sex: female Admission Date (Current Location): 08/18/2020  [facility] [place] and [place] Number:  Herbalist and Address:  [facility],  [place], [place]      Provider Number: [id number]  Attending Physician Name and Address:  Barb Merino, MD  Relative Name and Phone Number:  Channell Quattrone (spouse) Ph: [phone]    Current Level of Care: Hospital Recommended Level of Care: [place] Prior Approval Number:    Date Approved/Denied:   PASRR Number: [id number]  Discharge Plan: SNF    Current Diagnoses: Patient Active Problem List   Diagnosis Date Noted   Protein-calorie malnutrition, severe 08/19/2020   Altered mental status 08/18/2020   Prolonged QT interval 08/18/2020   Thrombocytopenia ([place]) 08/18/2020   Failure to thrive in adult 08/18/2020   Physical deconditioning 08/18/2020   Compression fracture of body of thoracic vertebra ([place]) 06/08/2020   Low back pain 03/27/2020   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   Pain of left hand 02/14/2019   Tibialis posterior tendinitis, right 07/08/2016   Ankle sprain 06/30/2016   Peripheral vascular disease ([place]) 04/03/2016   Ankle pain, left 03/31/2016   Sore in nose 03/31/2016   Well adult exam 09/25/2015   Adjustment disorder with mixed anxiety and depressed mood 02/03/2015   Allergic rhinitis 05/29/2014   Rash 06/10/2013   URI (upper respiratory infection) 03/08/2012   Lipoma 11/04/2009   PARESTHESIA 12/26/2008   Acute maxillary sinusitis 11/22/2007   Weight loss, abnormal 08/24/2007   Vitamin D deficiency 02/14/2007   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   Osteoporosis 09/01/2006    Orientation RESPIRATION BLADDER Height & Weight     Self, Situation, Place  Normal Incontinent Weight: 88 lb 10 oz (40.2 kg) Height:  5\' 4"  (162.6 cm)  BEHAVIORAL  SYMPTOMS/MOOD NEUROLOGICAL BOWEL NUTRITION STATUS      Continent Diet (Regular diet)  AMBULATORY STATUS COMMUNICATION OF NEEDS Skin   Limited Assist Verbally Normal                       Personal Care Assistance Level of Assistance  Bathing, Feeding, Dressing Bathing Assistance: Limited assistance Feeding assistance: Limited assistance Dressing Assistance: Limited assistance     Functional Limitations Info  Sight, Hearing, Speech Sight Info: Impaired Hearing Info: Adequate Speech Info: Adequate    SPECIAL CARE FACTORS FREQUENCY  PT (By licensed PT), OT (By licensed OT)     PT Frequency: 5x's/week OT Frequency: 5x's/week            Contractures Contractures Info: Not present    Additional Factors Info  Code Status, Allergies, Psychotropic Code Status Info: Full Allergies Info: Keflex (Cephalexin), Actonel (Risedronate), Alendronate Sodium, Risedronate Sodium, Tramadol Psychotropic Info: Lexapro         Current Medications (08/19/2020):  This is the current hospital active medication list Current Facility-Administered Medications  Medication Dose Route Frequency Provider Last Rate Last Admin   0.9 %  sodium chloride infusion   Intravenous Continuous Adefeso, Oladapo, DO 125 mL/hr at 08/19/20 1057 New Bag at 08/19/20 1057   acetaminophen (TYLENOL) tablet 650 mg  650 mg Oral Q6H PRN Adefeso, Oladapo, DO       [START ON 08/20/2020] Camphor-Menthol-Methyl Sal 3.02-13-08 % PTCH 1 patch  1 patch Apply externally Daily Barb Merino, MD       cholecalciferol (VITAMIN  D3) tablet 2,000 Units  2,000 Units Oral Daily Adefeso, Oladapo, DO   2,000 Units at 08/19/20 1054   docusate sodium (COLACE) capsule 100 mg  100 mg Oral QHS Barb Merino, MD       enoxaparin (LOVENOX) injection 30 mg  30 mg Subcutaneous Q24H Adefeso, Oladapo, DO       escitalopram (LEXAPRO) tablet 10 mg  10 mg Oral QPM Barb Merino, MD       [START ON 02/10/1279] folic acid (FOLVITE) tablet 0.5 mg  0.5 mg  Oral Daily Ghimire, Dante Gang, MD       gabapentin (NEURONTIN) capsule 100 mg  100 mg Oral TID Adefeso, Oladapo, DO   100 mg at 08/19/20 1054   lactose free nutrition (BOOST PLUS) liquid 237 mL  237 mL Oral BID BM Barb Merino, MD   237 mL at 08/19/20 1331   melatonin tablet 10 mg  10 mg Oral QHS Barb Merino, MD       multivitamin with minerals tablet 1 tablet  1 tablet Oral Daily Barb Merino, MD       Derrill Memo ON 08/20/2020] vitamin B-12 (CYANOCOBALAMIN) tablet 500 mcg  500 mcg Oral Daily Barb Merino, MD         Discharge Medications: Please see discharge summary for a list of discharge medications.  Relevant Imaging Results:  Relevant Lab Results:   Additional Information SSN: [id number]  Sherie Don, LCSW

## 2020-08-19 NOTE — Progress Notes (Signed)
Initial Nutrition Assessment  DOCUMENTATION CODES:   Severe malnutrition in context of chronic illness, Underweight  INTERVENTION:   -Boost Plus chocolate BID- Each supplement provides 360kcal and 14g protein mixed with ice cream  -Encouraged PO intakes  -Multivitamin with minerals daily  NUTRITION DIAGNOSIS:   Severe Malnutrition related to chronic illness (dementia) as evidenced by percent weight loss, energy intake < or equal to 75% for > or equal to 1 month, severe fat depletion, severe muscle depletion.  GOAL:   Patient will meet greater than or equal to 90% of their needs  MONITOR:   PO intake, Supplement acceptance, Labs, Weight trends, I & O's  REASON FOR ASSESSMENT:   Consult, Malnutrition Screening Tool Assessment of nutrition requirement/status  ASSESSMENT:   80 y.o. female with medical history significant for dyslipidemia, compression fracture (T11-T12) s/p kyphoplasty, dementia recent weight loss due to decreased oral intake who presents to the emergency department [facility] due to sudden onset of altered mental status and lethargy.  Patient in room, sitting in chair. Pt states she has not had any breakfast and was worrying about how to order. Per RN, pt is receiving house trays now. Pt with dementia. Eager to get up and walk, noted PT/OT consult.  Pt states she receives meals at her living facility and they are "ok". States she isn't really hungry. When encouraged to try lunch tray she became overwhelmed. Asked pt if she drinks Ensure/Boost drinks and she states she does with ice cream mixed in. Will order for pt. Unable to give much more history as she became confused.   Pt denies issues with swallowing. Noted that swallow eval was written on board in room.  Did state she is having weakness in her legs. Per weight records, pt has lost 17 lbs since 12/31/19 (16% wt loss x 8 months, significant for time frame).  Medications: Vitamin  D  Labs reviewed.  NUTRITION - FOCUSED PHYSICAL EXAM:  Flowsheet Row Most Recent Value  Orbital Region Moderate depletion  Upper Arm Region Severe depletion  Thoracic and Lumbar Region Unable to assess  Buccal Region Severe depletion  Temple Region Moderate depletion  Clavicle Bone Region Severe depletion  Clavicle and Acromion Bone Region Severe depletion  Scapular Bone Region Moderate depletion  Dorsal Hand Moderate depletion  Patellar Region Moderate depletion  Anterior Thigh Region Moderate depletion  Posterior Calf Region Moderate depletion  Edema (RD Assessment) None  Hair Reviewed  [thin]  Eyes Reviewed  Mouth Reviewed  Skin Reviewed  [dry]       Diet Order:   Diet Order             Diet regular Room service appropriate? Yes; Fluid consistency: Thin  Diet effective now                   EDUCATION NEEDS:   No education needs have been identified at this time  Skin:  Skin Assessment: Reviewed RN Assessment  Last BM:  7/10  Height:   Ht Readings from Last 1 Encounters:  08/18/20 5\' 4"  (1.626 m)    Weight:   Wt Readings from Last 1 Encounters:  08/18/20 40.2 kg    BMI:  Body mass index is 15.21 kg/m.  Estimated Nutritional Needs:   Kcal:  1600-1800  Protein:  75-85g  Fluid:  1.8L/day  Clayton Bibles, MS, RD, LDN Inpatient Clinical Dietitian Contact information available via Amion

## 2020-08-19 NOTE — Evaluation (Signed)
Occupational Therapy Evaluation Patient Details Name: Tanya Walker MRN: [id number] DOB: February 01, 1941 Today's Date: 08/19/2020    History of Present Illness Patient is an 80 year old female admitted from ALF for AMS, lethargy. PMH includes dyslipidemia, compression fracture (T11-T12) s/p kyphoplasty, dementia recent weight loss due to decreased oral intake   Clinical Impression   Patient resides at [facility], states she has been there for 5 years, was not using a walker and independent with ADL/IADLs. Patient does have history of dementia, would need confirmation of PLOF. Patient also unable to state why she is in the hospital. Currently patient overall min A for out of bed activity due to unsteadiness with patient reaching out for furniture needing hand held assist. Patient needing cues due to poor short term memory at sink to wash her face asking "I need to wash my bottom?" Would recommend return to ALF as it is patient's familiar environment and [place], however if they are unable to provide current assist levels then short term rehab at [facility]. Acute OT to follow.    Follow Up Recommendations  Home health OT;Other (comment) ([facility] at ALF if able to provide current assist, if not SNF)    Equipment Recommendations  None recommended by OT       Precautions / Restrictions Precautions Precautions: Fall Restrictions Weight Bearing Restrictions: No      Mobility Bed Mobility Overal bed mobility: Needs Assistance Bed Mobility: Rolling;Sidelying to Sit Rolling: Supervision Sidelying to sit: Min guard       General bed mobility comments: cue patient in log roll technique, once seated up right keeps trying to lay herself back onto the bed needing cues to redirect    Transfers Overall transfer level: Needs assistance Equipment used: 1 person hand held assist Transfers: Sit to/from Stand Sit to Stand: Min assist         General transfer comment: for steadying    Balance Overall  balance assessment: Needs assistance Sitting-balance support: Feet supported Sitting balance-Leahy Scale: Fair     Standing balance support: Single extremity supported Standing balance-Leahy Scale: Poor Standing balance comment: reliant on UE support/min A                           ADL either performed or assessed with clinical judgement   ADL Overall ADL's : Needs assistance/impaired     Grooming: Wash/dry face;Wash/dry hands;Min guard;Standing Grooming Details (indicate cue type and reason): mod cues for follow through of instructions, poor short term memory asking "I'm supposed to wash my bottom?" after instructed to wash face Upper Body Bathing: Supervision/ safety;Sitting   Lower Body Bathing: Minimal assistance;Sitting/lateral leans;Sit to/from stand   Upper Body Dressing : Supervision/safety;Sitting   Lower Body Dressing: Minimal assistance;Sitting/lateral leans;Sit to/from stand   Toilet Transfer: Minimal assistance;Ambulation Toilet Transfer Details (indicate cue type and reason): patient tremulous, when asked if feeling dizzy or in pain just states cold. min A for steadying and safety with patient reaching out for furniture to steady herself Toileting- Water quality scientist and Hygiene: Sit to/from stand;Sitting/lateral lean;Minimal assistance       Functional mobility during ADLs: Minimal assistance General ADL Comments: unsure of patient's baseline with ADLs however currently needing assist due to unsteadiness, decreased activity tolerance, safety awareness      Pertinent Vitals/Pain Pain Assessment: No/denies pain     Hand Dominance Right   Extremity/Trunk Assessment Upper Extremity Assessment Upper Extremity Assessment: Generalized weakness   Lower Extremity  Assessment Lower Extremity Assessment: Defer to PT evaluation   Cervical / Trunk Assessment Cervical / Trunk Assessment: Normal   Communication Communication Communication: No  difficulties   Cognition Arousal/Alertness: Awake/alert Behavior During Therapy: Flat affect Overall Cognitive Status: No family/caregiver present to determine baseline cognitive functioning                                 General Comments: patient with history of dementia, unsure typical orientation. Patient was unable to state why she is in the hospital, originally stating shes in [place]. Oriented to name of hospital and month/year.              Home Living Family/patient expects to be discharged to:: Assisted living                             Home Equipment: None          Prior Functioning/Environment Level of Independence: Independent        Comments: patient reports independent with self care and IADLs such as cooking and laundry, would need to confirm with staff 2* questionable historian        OT Problem List: Decreased strength;Decreased activity tolerance;Impaired balance (sitting and/or standing);Decreased cognition;Decreased safety awareness      OT Treatment/Interventions: Self-care/ADL training;Therapeutic activities;Cognitive remediation/compensation;Patient/family education;Balance training;Therapeutic exercise    OT Goals(Current goals can be found in the care plan section) Acute Rehab OT Goals Patient Stated Goal: "I'm cold" OT Goal Formulation: With patient Time For Goal Achievement: 09/02/20 Potential to Achieve Goals: Fair  OT Frequency: Min 2X/week    AM-PAC OT "6 Clicks" Daily Activity     Outcome Measure Help from another person eating meals?: A Little Help from another person taking care of personal grooming?: A Little Help from another person toileting, which includes using toliet, bedpan, or urinal?: A Little Help from another person bathing (including washing, rinsing, drying)?: A Little Help from another person to put on and taking off regular upper body clothing?: A Little Help from another person to put on  and taking off regular lower body clothing?: A Little 6 Click Score: 18   End of Session Nurse Communication: Mobility status;Other (comment) (need for chair alarm batteries)  Activity Tolerance: Patient tolerated treatment well Patient left: in chair;with call bell/phone within reach;with chair alarm set  OT Visit Diagnosis: Unsteadiness on feet (R26.81);Muscle weakness (generalized) (M62.81);Other symptoms and signs involving cognitive function                Time: 8466-5993 OT Time Calculation (min): 19 min Charges:  OT General Charges $OT Visit: 1 Visit OT Evaluation $OT Eval Low Complexity: [place] OT OT pager: [phone]  Rosemary Holms 08/19/2020, 10:04 AM

## 2020-08-20 DIAGNOSIS — R4182 Altered mental status, unspecified: Secondary | ICD-10-CM | POA: Diagnosis not present

## 2020-08-20 LAB — RESP PANEL BY RT-PCR (FLU A&B, COVID) ARPGX2
Influenza A by PCR: NEGATIVE
Influenza B by PCR: NEGATIVE
SARS Coronavirus 2 by RT PCR: NEGATIVE

## 2020-08-20 NOTE — Plan of Care (Signed)

## 2020-08-20 NOTE — Discharge Summary (Signed)
Physician Discharge Summary  Tanya Walker XFG:[id number] DOB: 08-26-1940 DOA: 08/18/2020  PCP: Cassandria Anger, MD  Admit date: 08/18/2020 Discharge date: 08/20/2020  Admitted From: Assisted living facility Disposition: Skilled nursing facility  Recommendations for Outpatient Follow-up:  Follow up with PCP in 1-2 weeks   Home Health: Not applicable Equipment/Devices: Not applicable  Discharge Condition: Stable CODE STATUS: Full code Diet recommendation: Regular diet, encourage eating.  Discharge summary: Patient is 80 year old female with history of hyperlipidemia, back problems, and recent weight loss decreased oral intake brought to the emergency room from assisted living facility due to sudden onset of altered mental status and lethargy.  Patient is poor historian.  Apparently, for her poor appetite she was started on mirtazapine about 5 days ago.  In the emergency room blood pressure 185/94.  Otherwise stable.  Most of the serological tests were normal. CT head normal.  CT scan abdomen pelvis was normal except showing a stable T11 and L1 compression fractures.  Chest x-ray was normal.  COVID-19 and influenza swab was negative.  For unknown diagnosis, she was advised to be observed in the hospital.  Given IV fluids overnight.   Assessment and plan of care:   Altered mental status:  Unable to find any acute causes for altered mentation.  Mental status seems to be improving after admission to the hospital.  Recently started on Remeron that we will discontinue. Reported more confusion after starting Remeron. 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 Symptomatic treatment, continue Tylenol, gabapentin, melatonin at night.  Resume citalopram that she takes at home. All-time fall precautions.  Delirium precautions. Patient with deconditioning.  She will benefit with more services and inpatient therapies.  Will refer to a skilled  nursing facility before transitioning to assisted living place.   Vertebral compression fractures: Multilevel compression fractures.  PT OT and pain relief with Tylenol.  Failure to thrive/physical deconditioning/mild clinical dehydration: Nutrition supplements. Continue palliative care follow-up. Currently full code.  Patient is stable to transition to a skilled level of care.  She will need ongoing follow-up and dementia care.   Discharge Diagnoses:  Principal Problem:   Altered mental status Active Problems:   Compression fracture of body of thoracic vertebra (HCC)   Prolonged QT interval   Thrombocytopenia (HCC)   Failure to thrive in adult   Physical deconditioning   Protein-calorie malnutrition, severe   Encephalopathy acute    Discharge Instructions  Discharge Instructions     Diet general   Complete by: As directed    Increase activity slowly   Complete by: As directed       Allergies as of 08/20/2020       Reactions   Keflex [cephalexin] Other (See Comments)   "Allergic," per MAR   Actonel [risedronate] Other (See Comments)   "Allergic," per MAR   Alendronate Sodium Other (See Comments)   Leg cramps and "Allergic," per MAR   Risedronate Sodium Other (See Comments)   Caused the patient to be achy and is "Allergic," per MAR   Tramadol Other (See Comments)   Caused the patient to feel badly         Medication List     STOP taking these medications    mirtazapine 15 MG tablet Commonly known as: REMERON       TAKE these medications    acetaminophen 500 MG tablet Commonly known as: TYLENOL Take 1,000 mg by mouth every 6 (six) hours as needed for moderate  pain, mild pain, headache or fever.   cetirizine 10 MG tablet Commonly known as: ZyrTEC Allergy Take 1 tablet (10 mg total) by mouth daily.   docusate sodium 100 MG capsule Commonly known as: COLACE Take 100 mg by mouth at bedtime.   escitalopram 10 MG tablet Commonly known as:  LEXAPRO Take 10 mg by mouth every evening.   gabapentin 100 MG capsule Commonly known as: NEURONTIN Take 1 capsule (100 mg total) by mouth 3 (three) times daily.   Melatonin 10 MG Tabs Take 10 mg by mouth at bedtime.   PRESERVISION AREDS 2 PO Take 1 capsule by mouth 2 (two) times daily.   Salonpas 3.02-13-08 % Ptch Generic drug: Camphor-Menthol-Methyl Sal Apply 1 patch topically See admin instructions. Apply 1 patch to the lower back and remove (at most) after 8 hours- once a day   VITAMIN B12-FOLIC ACID PO Place 1 lozenge under the tongue in the morning.   Vitamin D3 50 MCG (2000 UT) capsule Take 1 capsule (2,000 Units total) by mouth daily.        Follow-up Information     Plotnikov, [name], MD Follow up.   Specialty: Internal Medicine Contact information: [place] [address] [phone]                Allergies  Allergen Reactions   Keflex [Cephalexin] Other (See Comments)    "Allergic," per [facility]   Actonel [Risedronate] Other (See Comments)    "Allergic," per MAR   Alendronate Sodium Other (See Comments)    Leg cramps and "Allergic," per MAR   Risedronate Sodium Other (See Comments)    Caused the patient to be achy and is "Allergic," per MAR   Tramadol Other (See Comments)    Caused the patient to feel badly     Consultations: Palliative medicine   Procedures/Studies: CT Head Wo Contrast  Result Date: 08/18/2020 CLINICAL DATA:  80 year old female with altered mental status. EXAM: CT HEAD WITHOUT CONTRAST TECHNIQUE: Contiguous axial images were obtained from the base of the skull through the vertex without intravenous contrast. COMPARISON:  None. FINDINGS: Brain: Moderate age-related atrophy and chronic microvascular ischemic changes. 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 There is no acute intracranial hemorrhage. No mass effect or midline shift. No extra-axial fluid collection. Vascular: No hyperdense vessel or  unexpected calcification. Skull: Normal. Negative for fracture or focal lesion. Sinuses/Orbits: No acute finding. Other: None IMPRESSION: 1. No acute intracranial pathology. 2. Moderate age-related atrophy and chronic microvascular ischemic changes. Electronically Signed   By: Anner Crete M.D.   On: 08/18/2020 19:59   CT Abdomen Pelvis W Contrast  Result Date: 08/18/2020 CLINICAL DATA:  Altered mental status and lethargy. EXAM: CT ABDOMEN AND PELVIS WITH CONTRAST TECHNIQUE: Multidetector CT imaging of the abdomen and pelvis was performed using the standard protocol following bolus administration of intravenous contrast. CONTRAST:  75 mL OMNIPAQUE IOHEXOL 350 MG/ML SOLN COMPARISON:  CT thoracic spine 04/16/2020. FINDINGS: Lower chest: There is cardiomegaly. No pleural or pericardial effusion. Lung bases clear. Hepatobiliary: No focal liver abnormality is seen. Status post cholecystectomy. No biliary dilatation. Pancreas: Unremarkable. No pancreatic ductal dilatation or surrounding inflammatory changes. Spleen: Normal in size. No focal lesion. A few calcifications in the spleen are consistent with old granulomatous disease. Adrenals/Urinary Tract: Adrenal glands are unremarkable. Kidneys are normal, without renal calculi, focal lesion, or hydronephrosis. Bladder is distended but otherwise unremarkable. Stomach/Bowel: Stomach is within normal limits. Status post appendectomy. No evidence of bowel  wall thickening, distention, or inflammatory changes. Vascular/Lymphatic: Aortic atherosclerosis. No enlarged abdominal or pelvic lymph nodes. Reproductive: Uterus and bilateral adnexa are unremarkable. Other: None. Musculoskeletal: The patient has T11, T12 and L1 compression fractures. The T11 and L1 fractures are new since the prior thoracic spine CT. Vertebral body height loss at T11 is up to 80% anteriorly and there is approximately 60% vertebral body height loss at L1. Minimal retropulsion off the superior  endplate of Q11. Mild bony retropulsion is seen off the superior endplate of L1. Remote T12 compression fracture where the patient is status post vertebral augmentation noted. No other acute bony abnormality is seen. No lytic or sclerotic lesion. IMPRESSION: T11 and L1 compression fractures are new since the 04/16/2020 thoracic spine CT. Vertebral body height loss is worse at T11 where it is up to approximately 80%. There is mild bony retropulsion off the superior endplate of L1 which appears to cause mild central canal stenosis at T12-L1. Minimal retropulsion of seen off the superior endplate of H41 Status post vertebral augmentation at L1 for compression fracture seen on the prior CT. Distended urinary bladder. Cause for this finding is not identified and may be incidental. Cardiomegaly. Aortic Atherosclerosis (ICD10-I70.0). Electronically Signed   By: Inge Rise M.D.   On: 08/18/2020 20:03   DG Chest Port 1 View  Result Date: 08/18/2020 CLINICAL DATA:  Diffuse weakness EXAM: PORTABLE CHEST 1 VIEW COMPARISON:  None. FINDINGS: Cardiac shadow is within normal limits. Lungs are well aerated bilaterally. No focal infiltrate or sizable effusion is noted. Aortic calcifications are seen. No bony abnormality is noted. IMPRESSION: No acute abnormality noted. Electronically Signed   By: Inez Catalina M.D.   On: 08/18/2020 18:07   (Echo, Carotid, EGD, Colonoscopy, ERCP)    Subjective: Patient seen and examined.  Poor historian but denies any complaints.  No overnight events. Her blood pressures are mildly elevated, however will not start treatment at this time.  Will be reasonable to monitor at outpatient environment before deciding treatment.   Discharge Exam: Vitals:   08/19/20 2146 08/20/20 0626  BP: (!) 158/76 (!) 167/95  Pulse: 63 67  Resp: 15 17  Temp: 97.8 F (36.6 C) 97.7 F (36.5 C)  SpO2: 98% 99%   Vitals:   08/19/20 1029 08/19/20 1340 08/19/20 2146 08/20/20 0626  BP: 115/72 (!) 153/67  (!) 158/76 (!) 167/95  Pulse: 88 75 63 67  Resp: 18 18 15 17   Temp:   97.8 F (36.6 C) 97.7 F (36.5 C)  TempSrc:   Oral Oral  SpO2: 91% 99% 98% 99%  Weight:      Height:        General: Pt is alert, awake, not in acute distress Sitting in couch. Patient is alert and awake and oriented x1-2.  She is pleasant then gets impulsive and irritative on further interview.  On room air.  Not in any distress. Cardiovascular: RRR, S1/S2 +, no rubs, no gallops Respiratory: CTA bilaterally, no wheezing, no rhonchi Abdominal: Soft, NT, ND, bowel sounds + Extremities: no edema, no cyanosis    The results of significant diagnostics from this hospitalization (including imaging, microbiology, ancillary and laboratory) are listed below for reference.     Microbiology: Recent Results (from the past 240 hour(s))  Resp Panel by RT-PCR (Flu A&B, Covid) Nasopharyngeal Swab     Status: None   Collection Time: 08/18/20  4:24 PM   Specimen: Nasopharyngeal Swab; Nasopharyngeal(NP) swabs in vial transport medium  Result Value Ref Range  Status   SARS Coronavirus 2 by RT PCR NEGATIVE NEGATIVE Final    Comment: (NOTE) SARS-CoV-2 target nucleic acids are NOT DETECTED.  The SARS-CoV-2 RNA is generally detectable in upper respiratory specimens during the acute phase of infection. The lowest concentration of SARS-CoV-2 viral copies this assay can detect is 138 copies/mL. A negative result does not preclude SARS-Cov-2 infection and should not be used as the sole basis for treatment or other patient management decisions. A negative result may occur with  improper specimen collection/handling, submission of specimen other than nasopharyngeal swab, presence of viral mutation(s) within the areas targeted by this assay, and inadequate number of viral copies(<138 copies/mL). A negative result must be combined with clinical observations, patient history, and epidemiological information. The expected result is  Negative.  Fact Sheet for Patients:  EntrepreneurPulse.com.au  Fact Sheet for Healthcare Providers:  IncredibleEmployment.be  This test is no t yet approved or cleared by the [place] FDA and  has been authorized for detection and/or diagnosis of SARS-CoV-2 by FDA under an Emergency Use Authorization (EUA). This EUA will remain  in effect (meaning this test can be used) for the duration of the COVID-19 declaration under Section 564(b)(1) of the Act, 21 U.S.C.section 360bbb-3(b)(1), unless the authorization is terminated  or revoked sooner.       Influenza A by PCR NEGATIVE NEGATIVE Final   Influenza B by PCR NEGATIVE NEGATIVE Final    Comment: (NOTE) The Xpert Xpress SARS-CoV-2/FLU/RSV plus assay is intended as an aid in the diagnosis of influenza from Nasopharyngeal swab specimens and should not be used as a sole basis for treatment. Nasal washings and aspirates are unacceptable for Xpert Xpress SARS-CoV-2/FLU/RSV testing.  Fact Sheet for Patients: EntrepreneurPulse.com.au  Fact Sheet for Healthcare Providers: IncredibleEmployment.be  This test is not yet approved or cleared by the [place] FDA and has been authorized for detection and/or diagnosis of SARS-CoV-2 by FDA under an Emergency Use Authorization (EUA). This EUA will remain in effect (meaning this test can be used) for the duration of the COVID-19 declaration under Section 564(b)(1) of the Act, 21 U.S.C. section 360bbb-3(b)(1), unless the authorization is terminated or revoked.  Performed at [facility], [place] [address] [place], [place] [address]      Labs: BNP (last 3 results) No results for input(s): BNP in the last 8760 hours. Basic Metabolic Panel: Recent Labs  Lab 08/18/20 1622 08/18/20 2330 08/19/20 0429  NA 138  --  137  K 3.6  --  3.6  CL 104  --  104  CO2 28  --  24  GLUCOSE 87  --  80  BUN  17  --  10  CREATININE 0.50  --  <0.30*  CALCIUM 8.8*  --  8.6*  MG  --  2.2 2.1  PHOS  --   --  3.1   Liver Function Tests: Recent Labs  Lab 08/18/20 1622 08/19/20 0429  AST 25 22  ALT 18 18  ALKPHOS 91 86  BILITOT 0.9 1.0  PROT 6.3* 6.4*  ALBUMIN 3.7 3.9   Recent Labs  Lab 08/18/20 1622  LIPASE 34   No results for input(s): AMMONIA in the last 168 hours. CBC: Recent Labs  Lab 08/18/20 1622 08/19/20 0429  WBC 5.6 6.2  NEUTROABS 3.1  --   HGB 13.1 14.0  HCT 40.8 43.1  MCV 94.9 95.4  PLT 135* 147*   Cardiac Enzymes: No results for input(s): CKTOTAL, CKMB, CKMBINDEX, TROPONINI in the  last 168 hours. BNP: Invalid input(s): POCBNP CBG: Recent Labs  Lab 08/18/20 1736  GLUCAP 72   D-Dimer No results for input(s): DDIMER in the last 72 hours. Hgb A1c No results for input(s): HGBA1C in the last 72 hours. Lipid Profile No results for input(s): CHOL, HDL, LDLCALC, TRIG, CHOLHDL, LDLDIRECT in the last 72 hours. Thyroid function studies No results for input(s): TSH, T4TOTAL, T3FREE, THYROIDAB in the last 72 hours.  Invalid input(s): FREET3 Anemia work up No results for input(s): VITAMINB12, FOLATE, FERRITIN, TIBC, IRON, RETICCTPCT in the last 72 hours. Urinalysis    Component Value Date/Time   COLORURINE STRAW (A) 08/18/2020 1830   APPEARANCEUR CLEAR 08/18/2020 1830   LABSPEC 1.009 08/18/2020 1830   PHURINE 7.0 08/18/2020 1830   GLUCOSEU NEGATIVE 08/18/2020 1830   GLUCOSEU NEGATIVE 12/05/2016 1056   [place] 08/18/2020 1830   BILIRUBINUR NEGATIVE 08/18/2020 1830   KETONESUR 5 (A) 08/18/2020 1830   PROTEINUR NEGATIVE 08/18/2020 1830   UROBILINOGEN 0.2 12/05/2016 1056   NITRITE NEGATIVE 08/18/2020 1830   LEUKOCYTESUR NEGATIVE 08/18/2020 1830   Sepsis Labs Invalid input(s): PROCALCITONIN,  WBC,  LACTICIDVEN Microbiology Recent Results (from the past 240 hour(s))  Resp Panel by RT-PCR (Flu A&B, Covid) Nasopharyngeal Swab     Status: None    Collection Time: 08/18/20  4:24 PM   Specimen: Nasopharyngeal Swab; Nasopharyngeal(NP) swabs in vial transport medium  Result Value Ref Range Status   SARS Coronavirus 2 by RT PCR NEGATIVE NEGATIVE Final    Comment: (NOTE) SARS-CoV-2 target nucleic acids are NOT DETECTED.  The SARS-CoV-2 RNA is generally detectable in upper respiratory specimens during the acute phase of infection. The lowest concentration of SARS-CoV-2 viral copies this assay can detect is 138 copies/mL. A negative result does not preclude SARS-Cov-2 infection and should not be used as the sole basis for treatment or other patient management decisions. A negative result may occur with  improper specimen collection/handling, submission of specimen other than nasopharyngeal swab, presence of viral mutation(s) within the areas targeted by this assay, and inadequate number of viral copies(<138 copies/mL). A negative result must be combined with clinical observations, patient history, and epidemiological information. The expected result is Negative.  Fact Sheet for Patients:  EntrepreneurPulse.com.au  Fact Sheet for Healthcare Providers:  IncredibleEmployment.be  This test is no t yet approved or cleared by the [place] FDA and  has been authorized for detection and/or diagnosis of SARS-CoV-2 by FDA under an Emergency Use Authorization (EUA). This EUA will remain  in effect (meaning this test can be used) for the duration of the COVID-19 declaration under Section 564(b)(1) of the Act, 21 U.S.C.section 360bbb-3(b)(1), unless the authorization is terminated  or revoked sooner.       Influenza A by PCR NEGATIVE NEGATIVE Final   Influenza B by PCR NEGATIVE NEGATIVE Final    Comment: (NOTE) The Xpert Xpress SARS-CoV-2/FLU/RSV plus assay is intended as an aid in the diagnosis of influenza from Nasopharyngeal swab specimens and should not be used as a sole basis for treatment.  Nasal washings and aspirates are unacceptable for Xpert Xpress SARS-CoV-2/FLU/RSV testing.  Fact Sheet for Patients: EntrepreneurPulse.com.au  Fact Sheet for Healthcare Providers: IncredibleEmployment.be  This test is not yet approved or cleared by the [place] FDA and has been authorized for detection and/or diagnosis of SARS-CoV-2 by FDA under an Emergency Use Authorization (EUA). This EUA will remain in effect (meaning this test can be used) for the duration of the COVID-19 declaration under Section  564(b)(1) of the Act, 21 U.S.C. section 360bbb-3(b)(1), unless the authorization is terminated or revoked.  Performed at [facility], [place] [address] [place], [place] [address]      Time coordinating discharge:  28 minutes  SIGNED:   Barb Merino, MD  [facility] 08/20/2020, 10:48 AM

## 2020-08-20 NOTE — Progress Notes (Signed)
Attempted to call report to [phone]. After second attempt, I left a message and call back number where I can be reached to give report on this patient. DC instructions were sent with patient via PTAR.

## 2020-08-20 NOTE — TOC Transition Note (Signed)
Transition of Care [facility]) - CM/SW Discharge Note  Patient Details  Name: Tanya Walker MRN: [id number] Date of Birth: 1940-10-05  [facility] CM/SW Contact:  Sherie Don, LCSW Phone Number: 08/20/2020, 1:19 PM  Clinical Narrative: Patient is medically ready for discharge to SNF, but does not meet the 3 midnight rule so patient will be private pay. CSW updated Karlene Einstein with [facility] and the patient's POA, Chanetta [place] [phone]), as patient's husband is reportedly not able to make decisions for the patient. Discharge summary, discharge orders, and SNF transfer report faxed to facility in hub. COVID test is negative. Medical necessity form done; PTAR scheduled. Discharge packet completed. RN updated. TOC signing off.  Final next level of care: Skilled Nursing Facility Barriers to Discharge: Barriers Resolved  Patient Goals and CMS Choice Patient states their goals for this hospitalization and ongoing recovery are:: Return to [facility] for rehab CMS Medicare.gov Compare Post Acute Care list provided to:: Patient Represenative (must comment) Choice offered to / list presented to : [place] / [facility]  Discharge Placement PASRR number recieved: 08/12/20        Patient chooses bed at: [facility] Patient to be transferred to facility by: [facility] Name of family member notified: Husband, POA Chanetta [place], [phone]) Patient and family notified of of transfer: 08/20/20  Discharge Plan and Services In-house Referral: Clinical Social Work Post Acute Care Choice: [place]          DME Arranged: N/A DME Agency: NA  Readmission Risk Interventions No flowsheet data found.

## 2020-08-21 ENCOUNTER — Encounter: Payer: Self-pay | Admitting: Nurse Practitioner

## 2020-08-21 ENCOUNTER — Non-Acute Institutional Stay (SKILLED_NURSING_FACILITY): Payer: Medicare Other | Admitting: Nurse Practitioner

## 2020-08-21 DIAGNOSIS — G309 Alzheimer's disease, unspecified: Secondary | ICD-10-CM | POA: Diagnosis not present

## 2020-08-21 DIAGNOSIS — F339 Major depressive disorder, recurrent, unspecified: Secondary | ICD-10-CM

## 2020-08-21 DIAGNOSIS — E785 Hyperlipidemia, unspecified: Secondary | ICD-10-CM

## 2020-08-21 DIAGNOSIS — M545 Low back pain, unspecified: Secondary | ICD-10-CM | POA: Diagnosis not present

## 2020-08-21 DIAGNOSIS — E538 Deficiency of other specified B group vitamins: Secondary | ICD-10-CM

## 2020-08-21 DIAGNOSIS — R634 Abnormal weight loss: Secondary | ICD-10-CM | POA: Diagnosis not present

## 2020-08-21 DIAGNOSIS — E559 Vitamin D deficiency, unspecified: Secondary | ICD-10-CM | POA: Diagnosis not present

## 2020-08-21 DIAGNOSIS — D696 Thrombocytopenia, unspecified: Secondary | ICD-10-CM | POA: Diagnosis not present

## 2020-08-21 DIAGNOSIS — F0390 Unspecified dementia without behavioral disturbance: Secondary | ICD-10-CM | POA: Insufficient documentation

## 2020-08-21 DIAGNOSIS — F0280 Dementia in other diseases classified elsewhere without behavioral disturbance: Secondary | ICD-10-CM | POA: Diagnosis not present

## 2020-08-21 DIAGNOSIS — R45851 Suicidal ideations: Secondary | ICD-10-CM | POA: Insufficient documentation

## 2020-08-21 DIAGNOSIS — J3089 Other allergic rhinitis: Secondary | ICD-10-CM | POA: Diagnosis not present

## 2020-08-21 NOTE — Assessment & Plan Note (Signed)
diet, LDL 118 11/01/19

## 2020-08-21 NOTE — Assessment & Plan Note (Signed)
Chronic back pain, stable, T11, L1 compression fx, takes Tylenol, Gabapentin.

## 2020-08-21 NOTE — Assessment & Plan Note (Signed)
Vit B12 deficiency, Vit B12 level 499 05/21/20, takes Vit B12

## 2020-08-21 NOTE — Assessment & Plan Note (Signed)
Her mood is stable, takes Escitalopram

## 2020-08-21 NOTE — Assessment & Plan Note (Signed)
Weight loss/adult failure to thrive, SNF [facility] for safety, care needs. SNF [facility] for supportive care.

## 2020-08-21 NOTE — Progress Notes (Signed)
Location:   [facility] Room Number: Z610 Place of Service:  SNF (31) Provider: Lennie Odor Lyndsi Altic NP  Plotnikov, [name], MD  Patient Care Team: Cassandria Anger, MD as PCP - General  Extended Emergency Contact Information Primary Emergency Contact: Burman Blacksmith Address: [address], [place] [address] [place] Phone: [phone] Relation: Spouse  Code Status: DNR Goals of care: Advanced Directive information Advanced Directives 08/21/2020  Does Patient Have a Medical Advance Directive? No  Type of Advance Directive -  Does patient want to make changes to medical advance directive? No - Patient declined  Copy of [place] in Chart? -  Would patient like information on creating a medical advance directive? -     Chief Complaint  Patient presents with   Acute Visit    Patient presents for a review of medications.      HPI:  Pt is a 80 y.o. female seen today for an acute visit for medication review following hospital stay.   Hospitalized 08/18/20-08/20/20 for AMS/lethargy, didn't tolerate Mirtazapine for appetite. CT head/abd/pelvis showed stable T11, L1 compression fx, otherwise unremarkable, CXR and labs unremarkable.   Alzheimer's Dementia, Vit B12 499, TSH 2.12 05/21/20  Vit B12 deficiency, Vit B12 level 499 05/21/20, takes Vit B12  Vitamin D deficiency, takes Vit d daily  Allergic rhinitis, takes Zyrtec.   Hyperlipidemia, diet, LDL 118 11/01/19  Depression, takes Escitalopram  Chronic back pain, stable, T11, L1 compression fx, takes Tylenol, Gabapentin.   Weight loss/adult failure to thrive, SNF [facility] for safety, care needs.   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   Depression    years ago   Headache    sinus headaches   Lipoma of arm    Right   Medical history non-contributory     Osteoporosis    SVD (spontaneous vaginal delivery)    x 2   Varicose vein    Vitamin D deficiency    Past Surgical History:  Procedure Laterality Date   [place]     x2 left and right arm   HYSTEROSCOPY WITH D & C N/A 07/15/2013   Procedure: DILATATION AND CURETTAGE /HYSTEROSCOPY;  Surgeon: Lovenia Kim, MD;  Location: [facility];  Service: Gynecology;  Laterality: N/A;   KYPHOPLASTY Bilateral 05/04/2020   Procedure: KYPHOPLASTY THORACIC TWELVE;  Surgeon: Vallarie Mare, MD;  Location: [facility];  Service: Neurosurgery;  Laterality: Bilateral;   MANDIBLE SURGERY  1954   WISDOM TOOTH EXTRACTION      Allergies  Allergen Reactions   Keflex [Cephalexin] Other (See Comments)    "Allergic," per MAR   Actonel [Risedronate] Other (See Comments)    "Allergic," per MAR   Alendronate Sodium Other (See Comments)    Leg cramps and "Allergic," per MAR   Risedronate Sodium Other (See Comments)    Caused the patient to be achy and is "Allergic," per MAR   Tramadol Other (See Comments)    Caused the patient to feel badly     Allergies as of 08/21/2020       Reactions   Keflex [cephalexin] Other (See Comments)   "Allergic," per [facility]  Actonel [risedronate] Other (See Comments)   "Allergic," per MAR   Alendronate Sodium Other (See Comments)   Leg cramps and "Allergic," per MAR   Risedronate Sodium Other (See Comments)   Caused the patient to be achy and is "Allergic," per MAR   Tramadol Other (See Comments)   Caused the patient to feel badly         Medication List        Accurate as of August 21, 2020  4:10 PM. If you have any questions, ask your nurse or doctor.          acetaminophen 500 MG tablet Commonly known as: TYLENOL Take 1,000 mg by mouth every 6 (six) hours as needed for moderate pain, mild pain, headache or fever.   cetirizine 10 MG tablet Commonly known as: ZyrTEC Allergy Take 1 tablet  (10 mg total) by mouth daily.   docusate sodium 100 MG capsule Commonly known as: COLACE Take 100 mg by mouth at bedtime.   escitalopram 10 MG tablet Commonly known as: LEXAPRO Take 10 mg by mouth every evening.   gabapentin 100 MG capsule Commonly known as: NEURONTIN Take 1 capsule (100 mg total) by mouth 3 (three) times daily.   Melatonin 10 MG Tabs Take 10 mg by mouth at bedtime.   PRESERVISION AREDS 2 PO Take 1 capsule by mouth 2 (two) times daily.   Salonpas 3.02-13-08 % Ptch Generic drug: Camphor-Menthol-Methyl Sal Apply 1 patch topically See admin instructions. Apply 1 patch to the lower back and remove (at most) after 8 hours- once a day   VITAMIN B12-FOLIC ACID PO Place 1 lozenge under the tongue in the morning.   Vitamin D3 50 MCG (2000 UT) capsule Take 1 capsule (2,000 Units total) by mouth daily.        Review of Systems  Constitutional:  Negative for activity change and fever.  HENT:  Positive for hearing loss. Negative for congestion and voice change.   Eyes:  Negative for visual disturbance.  Respiratory:  Negative for cough, shortness of breath and wheezing.   Gastrointestinal:  Negative for abdominal pain, constipation, nausea and vomiting.  Genitourinary:  Negative for dysuria and urgency.  Musculoskeletal:  Positive for arthralgias, back pain and gait problem.  Skin:  Negative for color change.  Neurological:  Negative for speech difficulty, weakness and light-headedness.       Dementia.   Psychiatric/Behavioral:  Positive for confusion. Negative for behavioral problems. The patient is nervous/anxious.    Immunization History  Administered Date(s) Administered   DT (Pediatric) 04/10/2002   Fluad Quad(high Dose 65+) 10/28/2019   Influenza Split 03/08/2011, 11/17/2011   Influenza Whole 01/09/2007, 11/05/2008, 11/04/2009   Influenza, High Dose Seasonal PF 12/07/2015, 09/29/2016, 11/09/2017, 09/27/2018, 10/28/2019   Influenza-Unspecified 10/30/2012,  11/29/2013, 12/02/2014   Moderna Sars-Covid-2 Vaccination 02/08/2019, 03/11/2019   PFIZER(Purple Top)SARS-COV-2 Vaccination 10/09/2019   PPD Test 12/22/2015, 01/05/2016   Pneumococcal Conjugate-13 07/17/2014   Pneumococcal Polysaccharide-23 08/15/2005, 09/25/2015   Tetanus 12/24/2012   Zoster Recombinat (Shingrix) 06/07/2019, 09/06/2019   Zoster, Live 12/05/2013   Pertinent  Health Maintenance Due  Topic Date Due   INFLUENZA VACCINE  09/07/2020   DEXA SCAN  Completed   PNA vac Low Risk Adult  Completed   Fall Risk  10/10/2019 10/08/2018 10/02/2017 09/29/2016 10/08/2015  Falls in the past year? 0 0 No No No  Comment - - - - [place] Telephone Survey: data to providers prior to load  Number falls in past yr: 0 0 - - -  Injury with Fall? 0 0 - - -  Risk for fall due to : No Fall Risks - - - -  Follow up Falls evaluation completed - - - -   Functional Status Survey:    Vitals:   08/21/20 1421  BP: 120/71  Pulse: 70  Resp: 18  Temp: 98.1 F (36.7 C)  SpO2: 98%  Weight: 84 lb 11.2 oz (38.4 kg)  Height: 5\' 4"  (1.626 m)   Body mass index is 14.54 kg/m. Physical Exam Vitals and nursing note reviewed.  Constitutional:      Appearance: Normal appearance.  HENT:     Head: Normocephalic.     Nose: Nose normal.     Mouth/Throat:     Mouth: Mucous membranes are moist.  Eyes:     Conjunctiva/sclera: Conjunctivae normal.     Pupils: Pupils are equal, round, and reactive to light.  Cardiovascular:     Rate and Rhythm: Normal rate.     Heart sounds: No murmur heard. Abdominal:     Palpations: Abdomen is soft.     Tenderness: There is no abdominal tenderness.  Musculoskeletal:     Cervical back: Normal range of motion and neck supple.     Right lower leg: No edema.     Left lower leg: No edema.  Skin:    General: Skin is warm and dry.  Neurological:     General: No focal deficit present.     Mental Status: She is alert. Mental status is at baseline.     Motor: No weakness.      Coordination: Coordination normal.     Gait: Gait abnormal.     Comments: Oriented to self.   Psychiatric:        Mood and Affect: Mood normal.        Behavior: Behavior normal.    Labs reviewed: Recent Labs    07/09/20 1830 08/18/20 1622 08/18/20 2330 08/19/20 0429  NA 139 138  --  137  K 3.4* 3.6  --  3.6  CL 109 104  --  104  CO2 24 28  --  24  GLUCOSE 81 87  --  80  BUN 18 17  --  10  CREATININE 0.54 0.50  --  <0.30*  CALCIUM 7.5* 8.8*  --  8.6*  MG  --   --  2.2 2.1  PHOS  --   --   --  3.1   Recent Labs    07/09/20 1830 08/18/20 1622 08/19/20 0429  AST 18 25 22   ALT 15 18 18   ALKPHOS 73 91 86  BILITOT 0.7 0.9 1.0  PROT 5.5* 6.3* 6.4*  ALBUMIN 3.1* 3.7 3.9   Recent Labs    11/01/19 0848 05/04/20 0657 07/09/20 1830 08/18/20 1622 08/19/20 0429  WBC 7.6   < > 5.3 5.6 6.2  NEUTROABS 5,031  --  3.2 3.1  --   HGB 14.9   < > 12.8 13.1 14.0  HCT 46.1*   < > 39.9 40.8 43.1  MCV 93.1   < > 95.7 94.9 95.4  PLT 172   < > 127* 135* 147*   < > = values in this interval not displayed.   Lab Results  Component Value Date   TSH 2.12 05/21/2020   No results found for: HGBA1C Lab Results  Component Value Date   CHOL 201 (H) 11/01/2019   HDL 69 11/01/2019   LDLCALC 118 (H) 11/01/2019   LDLDIRECT 135.4 12/26/2008  TRIG 52 11/01/2019   CHOLHDL 2.9 11/01/2019    Significant Diagnostic Results in last 30 days:  CT Head Wo Contrast  Result Date: 08/18/2020 CLINICAL DATA:  80 year old female with altered mental status. EXAM: CT HEAD WITHOUT CONTRAST TECHNIQUE: Contiguous axial images were obtained from the base of the skull through the vertex without intravenous contrast. COMPARISON:  None. FINDINGS: Brain: Moderate age-related atrophy and chronic microvascular ischemic changes. 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 There is no acute intracranial hemorrhage. No mass effect or midline shift. No extra-axial fluid collection. Vascular: No hyperdense  vessel or unexpected calcification. Skull: Normal. Negative for fracture or focal lesion. Sinuses/Orbits: No acute finding. Other: None IMPRESSION: 1. No acute intracranial pathology. 2. Moderate age-related atrophy and chronic microvascular ischemic changes. Electronically Signed   By: Anner Crete M.D.   On: 08/18/2020 19:59   CT Abdomen Pelvis W Contrast  Result Date: 08/18/2020 CLINICAL DATA:  Altered mental status and lethargy. EXAM: CT ABDOMEN AND PELVIS WITH CONTRAST TECHNIQUE: Multidetector CT imaging of the abdomen and pelvis was performed using the standard protocol following bolus administration of intravenous contrast. CONTRAST:  75 mL OMNIPAQUE IOHEXOL 350 MG/ML SOLN COMPARISON:  CT thoracic spine 04/16/2020. FINDINGS: Lower chest: There is cardiomegaly. No pleural or pericardial effusion. Lung bases clear. Hepatobiliary: No focal liver abnormality is seen. Status post cholecystectomy. No biliary dilatation. Pancreas: Unremarkable. No pancreatic ductal dilatation or surrounding inflammatory changes. Spleen: Normal in size. No focal lesion. A few calcifications in the spleen are consistent with old granulomatous disease. Adrenals/Urinary Tract: Adrenal glands are unremarkable. Kidneys are normal, without renal calculi, focal lesion, or hydronephrosis. Bladder is distended but otherwise unremarkable. Stomach/Bowel: Stomach is within normal limits. Status post appendectomy. No evidence of bowel wall thickening, distention, or inflammatory changes. Vascular/Lymphatic: Aortic atherosclerosis. No enlarged abdominal or pelvic lymph nodes. Reproductive: Uterus and bilateral adnexa are unremarkable. Other: None. Musculoskeletal: The patient has T11, T12 and L1 compression fractures. The T11 and L1 fractures are new since the prior thoracic spine CT. Vertebral body height loss at T11 is up to 80% anteriorly and there is approximately 60% vertebral body height loss at L1. Minimal retropulsion off the  superior endplate of O97. Mild bony retropulsion is seen off the superior endplate of L1. Remote T12 compression fracture where the patient is status post vertebral augmentation noted. No other acute bony abnormality is seen. No lytic or sclerotic lesion. IMPRESSION: T11 and L1 compression fractures are new since the 04/16/2020 thoracic spine CT. Vertebral body height loss is worse at T11 where it is up to approximately 80%. There is mild bony retropulsion off the superior endplate of L1 which appears to cause mild central canal stenosis at T12-L1. Minimal retropulsion of seen off the superior endplate of D53 Status post vertebral augmentation at L1 for compression fracture seen on the prior CT. Distended urinary bladder. Cause for this finding is not identified and may be incidental. Cardiomegaly. Aortic Atherosclerosis (ICD10-I70.0). Electronically Signed   By: Inge Rise M.D.   On: 08/18/2020 20:03   DG Chest Port 1 View  Result Date: 08/18/2020 CLINICAL DATA:  Diffuse weakness EXAM: PORTABLE CHEST 1 VIEW COMPARISON:  None. FINDINGS: Cardiac shadow is within normal limits. Lungs are well aerated bilaterally. No focal infiltrate or sizable effusion is noted. Aortic calcifications are seen. No bony abnormality is noted. IMPRESSION: No acute abnormality noted. Electronically Signed   By: Inez Catalina M.D.   On: 08/18/2020 18:07    Assessment/Plan: Alzheimer disease ([place]) Alzheimer's  Dementia, Vit B12 499, TSH 2.12 05/21/20. Obtain MMSE  Vitamin B12 deficiency Vit B12 deficiency, Vit B12 level 499 05/21/20, takes Vit B12  Vitamin D deficiency Vitamin D deficiency, takes Vit d daily  Allergic rhinitis , takes Zyrtec.   Hyperlipidemia diet, LDL 118 11/01/19  Depression, recurrent (HCC) Her mood is stable, takes Escitalopram  Low back pain Chronic back pain, stable, T11, L1 compression fx, takes Tylenol, Gabapentin.   Weight loss, abnormal Weight loss/adult failure to thrive, SNF [facility] for  safety, care needs. SNF [facility] for supportive care.   Thrombocytopenia ([place]) plt 147    Family/ staff Communication: plan of care reviewed with the patient and charge nurse.   Labs/tests ordered:  none  Time spend 35 minutes.

## 2020-08-21 NOTE — Assessment & Plan Note (Signed)
,   takes Zyrtec. 

## 2020-08-21 NOTE — Assessment & Plan Note (Signed)
plt 147

## 2020-08-21 NOTE — Assessment & Plan Note (Signed)
Alzheimer's Dementia, Vit B12 499, TSH 2.12 05/21/20. Obtain MMSE

## 2020-08-21 NOTE — Assessment & Plan Note (Signed)
Vitamin D deficiency, takes Vit d daily

## 2020-08-24 DIAGNOSIS — R4182 Altered mental status, unspecified: Secondary | ICD-10-CM | POA: Diagnosis not present

## 2020-08-24 DIAGNOSIS — F0280 Dementia in other diseases classified elsewhere without behavioral disturbance: Secondary | ICD-10-CM | POA: Diagnosis not present

## 2020-08-24 DIAGNOSIS — M4854XD Collapsed vertebra, not elsewhere classified, thoracic region, subsequent encounter for fracture with routine healing: Secondary | ICD-10-CM | POA: Diagnosis not present

## 2020-08-24 DIAGNOSIS — R41841 Cognitive communication deficit: Secondary | ICD-10-CM | POA: Diagnosis not present

## 2020-08-24 DIAGNOSIS — G9340 Encephalopathy, unspecified: Secondary | ICD-10-CM | POA: Diagnosis not present

## 2020-08-24 DIAGNOSIS — M6281 Muscle weakness (generalized): Secondary | ICD-10-CM | POA: Diagnosis not present

## 2020-08-24 DIAGNOSIS — R29898 Other symptoms and signs involving the musculoskeletal system: Secondary | ICD-10-CM | POA: Diagnosis not present

## 2020-08-24 DIAGNOSIS — G319 Degenerative disease of nervous system, unspecified: Secondary | ICD-10-CM | POA: Diagnosis not present

## 2020-08-24 DIAGNOSIS — R2681 Unsteadiness on feet: Secondary | ICD-10-CM | POA: Diagnosis not present

## 2020-08-24 DIAGNOSIS — R627 Adult failure to thrive: Secondary | ICD-10-CM | POA: Diagnosis not present

## 2020-08-24 DIAGNOSIS — E43 Unspecified severe protein-calorie malnutrition: Secondary | ICD-10-CM | POA: Diagnosis not present

## 2020-08-25 ENCOUNTER — Encounter: Payer: Self-pay | Admitting: Internal Medicine

## 2020-08-25 ENCOUNTER — Non-Acute Institutional Stay (SKILLED_NURSING_FACILITY): Payer: Medicare Other | Admitting: Internal Medicine

## 2020-08-25 DIAGNOSIS — R4182 Altered mental status, unspecified: Secondary | ICD-10-CM | POA: Diagnosis not present

## 2020-08-25 DIAGNOSIS — R41841 Cognitive communication deficit: Secondary | ICD-10-CM | POA: Diagnosis not present

## 2020-08-25 DIAGNOSIS — M6281 Muscle weakness (generalized): Secondary | ICD-10-CM | POA: Diagnosis not present

## 2020-08-25 DIAGNOSIS — F339 Major depressive disorder, recurrent, unspecified: Secondary | ICD-10-CM

## 2020-08-25 DIAGNOSIS — R29898 Other symptoms and signs involving the musculoskeletal system: Secondary | ICD-10-CM | POA: Diagnosis not present

## 2020-08-25 DIAGNOSIS — D696 Thrombocytopenia, unspecified: Secondary | ICD-10-CM

## 2020-08-25 DIAGNOSIS — G9340 Encephalopathy, unspecified: Secondary | ICD-10-CM | POA: Diagnosis not present

## 2020-08-25 DIAGNOSIS — E538 Deficiency of other specified B group vitamins: Secondary | ICD-10-CM

## 2020-08-25 DIAGNOSIS — R2681 Unsteadiness on feet: Secondary | ICD-10-CM | POA: Diagnosis not present

## 2020-08-25 DIAGNOSIS — E559 Vitamin D deficiency, unspecified: Secondary | ICD-10-CM | POA: Diagnosis not present

## 2020-08-25 DIAGNOSIS — R634 Abnormal weight loss: Secondary | ICD-10-CM

## 2020-08-25 DIAGNOSIS — M8000XD Age-related osteoporosis with current pathological fracture, unspecified site, subsequent encounter for fracture with routine healing: Secondary | ICD-10-CM | POA: Diagnosis not present

## 2020-08-25 NOTE — Progress Notes (Signed)
Provider:  Veleta Miners MD Location:   [facility] Room Number: 30 Place of Service:  SNF (31)  PCP: Plotnikov, [name], MD Patient Care Team: Cassandria Anger, MD as PCP - General  Extended Emergency Contact Information Primary Emergency Contact: Burman Blacksmith Address: [address] [place], [place] [address] [place] Phone: [phone] Relation: Spouse  Code Status: Full Code Goals of Care: Advanced Directive information Advanced Directives 08/25/2020  Does Patient Have a Medical Advance Directive? Yes  Type of Paramedic of [place];Living will  Does patient want to make changes to medical advance directive? No - Patient declined  Copy of [place] in Chart? No - copy requested  Would patient like information on creating a medical advance directive? No - Patient declined      Chief Complaint  Patient presents with   New Admit To SNF    Admission to SNF   Health Maintenance    #4 Covid vaccine    HPI: Patient is a 80 y.o. female seen today for admission to SNF for Therapy Patient has h/o Osteoporosis with Multiple Compression Fracture on Evenity per Dr [name] H/o Weight loss ? Depression Has lost 20 lbs in past 3 months  Was admitted in Hospital from 7/12-7/14 for Change in Mental status  Patient with recent Weight loss was stared on Remeron and became Lethargic Her work up in the hospital was negative including CT scan of Her abdomen Showed T11 and L1 compression fractures  CT scan of head showed Moderate age-related atrophy and chronic microvascular ischemic changes. All her labs were normal Discharged to SNF for therapy  Per Speech therapist Patient continues to be depressed. Stays in her Room Continues to not eat. No Pain anywhere.No Nausea No Abdominal pain Having trouble with sleeping She agreed that she is depressed. 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   Depression    years ago   Headache    sinus headaches   Lipoma of arm    Right   Medical history non-contributory    Osteoporosis    SVD (spontaneous vaginal delivery)    x 2   Varicose vein    Vitamin D deficiency    Past Surgical History:  Procedure Laterality Date   [place]     x2 left and right arm   HYSTEROSCOPY WITH D & C N/A 07/15/2013   Procedure: DILATATION AND CURETTAGE /HYSTEROSCOPY;  Surgeon: Lovenia Kim, MD;  Location: [facility];  Service: Gynecology;  Laterality: N/A;   KYPHOPLASTY Bilateral 05/04/2020   Procedure: KYPHOPLASTY THORACIC TWELVE;  Surgeon: Vallarie Mare, MD;  Location: [facility];  Service: Neurosurgery;  Laterality: Bilateral;   MANDIBLE SURGERY  1954   WISDOM TOOTH EXTRACTION      reports that she has never smoked. She has never used smokeless tobacco. She reports that she does not drink alcohol and does not use drugs. Social History   Socioeconomic History   Marital status: Married    Spouse name: Not on file   Number of children: 1   Years of education: Not on file   Highest education level:  Not on file  Occupational History   Occupation: retired  Tobacco Use   Smoking status: Never   Smokeless tobacco: Never  Vaping Use   Vaping Use: Never used  Substance and Sexual Activity   Alcohol use: No   Drug use: No   Sexual activity: Yes    Birth control/protection: Post-menopausal  Other Topics Concern   Not on file  Social History Narrative   GI in [place] for colonoscopy   Dr Ronita Hipps - GYN      Denies Surgical history      Family history of varicose veins   Mother is 24      Regular exercise - NO   Social Determinants of Health   Financial Resource Strain: Low Risk    Difficulty of Paying Living Expenses: Not hard at all  Food  Insecurity: No Food Insecurity   Worried About Charity fundraiser in the Last Year: Never true   [place] in the Last Year: Never true  Transportation Needs: No Transportation Needs   Lack of Transportation (Medical): No   Lack of Transportation (Non-Medical): No  Physical Activity: Sufficiently Active   Days of Exercise per Week: 5 days   Minutes of Exercise per Session: 30 min  Stress: Stress Concern Present   Feeling of Stress : Rather much  Social Connections: Moderately Integrated   Frequency of Communication with Friends and Family: More than three times a week   Frequency of Social Gatherings with Friends and Family: Never   Attends Religious Services: Never   Marine scientist or Organizations: Yes   Attends Archivist Meetings: Never   Marital Status: Married  Human resources officer Violence: Not At Risk   Fear of Current or Ex-Partner: No   Emotionally Abused: No   Physically Abused: No   Sexually Abused: No    Functional Status Survey:    Family History  Problem Relation Age of Onset   Hypertension Mother    Heart disease Father    Cancer Father     Health Maintenance  Topic Date Due   COVID-19 Vaccine (4 - Booster) 01/08/2020   INFLUENZA VACCINE  09/07/2020   TETANUS/TDAP  12/25/2022   DEXA SCAN  Completed   PNA vac Low Risk Adult  Completed   Zoster Vaccines- Shingrix  Completed   HPV VACCINES  Aged Out    Allergies  Allergen Reactions   Keflex [Cephalexin] Other (See Comments)    "Allergic," per MAR   Actonel [Risedronate] Other (See Comments)    "Allergic," per MAR   Alendronate Sodium Other (See Comments)    Leg cramps and "Allergic," per MAR   Risedronate Sodium Other (See Comments)    Caused the patient to be achy and is "Allergic," per MAR   Tramadol Other (See Comments)    Caused the patient to feel badly     Allergies as of 08/25/2020       Reactions   Keflex [cephalexin] Other (See Comments)   "Allergic," per [facility]    Actonel [risedronate] Other (See Comments)   "Allergic," per MAR   Alendronate Sodium Other (See Comments)   Leg cramps and "Allergic," per MAR   Risedronate Sodium Other (See Comments)   Caused the patient to be achy and is "Allergic," per MAR   Tramadol Other (See Comments)   Caused the patient to feel badly         Medication List  Accurate as of August 25, 2020 11:46 AM. If you have any questions, ask your nurse or doctor.          acetaminophen 500 MG tablet Commonly known as: TYLENOL Take 1,000 mg by mouth every 6 (six) hours as needed for moderate pain, mild pain, headache or fever.   cetirizine 10 MG tablet Commonly known as: ZyrTEC Allergy Take 1 tablet (10 mg total) by mouth daily.   docusate sodium 100 MG capsule Commonly known as: COLACE Take 100 mg by mouth at bedtime.   escitalopram 10 MG tablet Commonly known as: LEXAPRO Take 10 mg by mouth every evening.   gabapentin 100 MG capsule Commonly known as: NEURONTIN Take 1 capsule (100 mg total) by mouth 3 (three) times daily.   Melatonin 10 MG Tabs Take 10 mg by mouth at bedtime.   PRESERVISION AREDS 2 PO Take 1 capsule by mouth 2 (two) times daily.   Salonpas 3.02-13-08 % Ptch Generic drug: Camphor-Menthol-Methyl Sal Apply 1 patch topically See admin instructions. Apply 1 patch to the lower back and remove (at most) after 8 hours- once a day   VITAMIN B12-FOLIC ACID PO Place 1 lozenge under the tongue in the morning.   Vitamin D3 50 MCG (2000 UT) capsule Take 1 capsule (2,000 Units total) by mouth daily.        Review of Systems  Constitutional:  Positive for activity change, appetite change and unexpected weight change.  HENT: Negative.    Respiratory: Negative.    Cardiovascular: Negative.   Gastrointestinal: Negative.   Genitourinary: Negative.   Musculoskeletal:  Positive for back pain.  Skin: Negative.   Neurological: Negative.   Psychiatric/Behavioral:  Positive for  confusion, dysphoric mood and sleep disturbance. The patient is nervous/anxious.    Vitals:   08/25/20 1140  BP: 140/82  Pulse: 60  Resp: 16  Temp: 98.4 F (36.9 C)  SpO2: 96%  Weight: 84 lb 11.2 oz (38.4 kg)  Height: 5\' 4"  (1.626 m)   Body mass index is 14.54 kg/m. Physical Exam Vitals reviewed.  Constitutional:      Comments: Very frail  HENT:     Head: Normocephalic.     Nose: Nose normal.     Mouth/Throat:     Mouth: Mucous membranes are moist.     Pharynx: Oropharynx is clear.  Eyes:     Pupils: Pupils are equal, round, and reactive to light.  Cardiovascular:     Pulses: Normal pulses.     Heart sounds: Normal heart sounds. No murmur heard. Pulmonary:     Effort: Pulmonary effort is normal.     Breath sounds: Normal breath sounds.  Abdominal:     General: Abdomen is flat.     Palpations: Abdomen is soft.     Comments: C/o Discomfort when press on lower abdomen  Musculoskeletal:        General: No swelling.     Cervical back: Neck supple.  Skin:    General: Skin is warm.  Neurological:     General: No focal deficit present.     Mental Status: She is alert.  Psychiatric:        Mood and Affect: Mood normal.        Thought Content: Thought content normal.     Comments: Looks Depressed. Avoids Eye contact. Nevous Tearful    Labs reviewed: Basic Metabolic Panel: Recent Labs    07/09/20 1830 08/18/20 1622 08/18/20 2330 08/19/20 0429  NA 139 138  --  137  K 3.4* 3.6  --  3.6  CL 109 104  --  104  CO2 24 28  --  24  GLUCOSE 81 87  --  80  BUN 18 17  --  10  CREATININE 0.54 0.50  --  <0.30*  CALCIUM 7.5* 8.8*  --  8.6*  MG  --   --  2.2 2.1  PHOS  --   --   --  3.1   Liver Function Tests: Recent Labs    07/09/20 1830 08/18/20 1622 08/19/20 0429  AST 18 25 22   ALT 15 18 18   ALKPHOS 73 91 86  BILITOT 0.7 0.9 1.0  PROT 5.5* 6.3* 6.4*  ALBUMIN 3.1* 3.7 3.9   Recent Labs    08/18/20 1622  LIPASE 34   No results for input(s): AMMONIA in  the last 8760 hours. CBC: Recent Labs    11/01/19 0848 05/04/20 0657 07/09/20 1830 08/18/20 1622 08/19/20 0429  WBC 7.6   < > 5.3 5.6 6.2  NEUTROABS 5,031  --  3.2 3.1  --   HGB 14.9   < > 12.8 13.1 14.0  HCT 46.1*   < > 39.9 40.8 43.1  MCV 93.1   < > 95.7 94.9 95.4  PLT 172   < > 127* 135* 147*   < > = values in this interval not displayed.   Cardiac Enzymes: No results for input(s): CKTOTAL, CKMB, CKMBINDEX, TROPONINI in the last 8760 hours. BNP: Invalid input(s): POCBNP No results found for: HGBA1C Lab Results  Component Value Date   TSH 2.12 05/21/2020   Lab Results  Component Value Date   FUXNATFT73 220 05/21/2020   No results found for: FOLATE No results found for: IRON, TIBC, FERRITIN  Imaging and Procedures obtained prior to SNF admission: CT Head Wo Contrast  Result Date: 08/18/2020 CLINICAL DATA:  80 year old female with altered mental status. EXAM: CT HEAD WITHOUT CONTRAST TECHNIQUE: Contiguous axial images were obtained from the base of the skull through the vertex without intravenous contrast. COMPARISON:  None. FINDINGS: Brain: Moderate age-related atrophy and chronic microvascular ischemic changes. 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 There is no acute intracranial hemorrhage. No mass effect or midline shift. No extra-axial fluid collection. Vascular: No hyperdense vessel or unexpected calcification. Skull: Normal. Negative for fracture or focal lesion. Sinuses/Orbits: No acute finding. Other: None IMPRESSION: 1. No acute intracranial pathology. 2. Moderate age-related atrophy and chronic microvascular ischemic changes. Electronically Signed   By: Anner Crete M.D.   On: 08/18/2020 19:59   CT Abdomen Pelvis W Contrast  Result Date: 08/18/2020 CLINICAL DATA:  Altered mental status and lethargy. EXAM: CT ABDOMEN AND PELVIS WITH CONTRAST TECHNIQUE: Multidetector CT imaging of the abdomen and pelvis was performed using the standard protocol  following bolus administration of intravenous contrast. CONTRAST:  75 mL OMNIPAQUE IOHEXOL 350 MG/ML SOLN COMPARISON:  CT thoracic spine 04/16/2020. FINDINGS: Lower chest: There is cardiomegaly. No pleural or pericardial effusion. Lung bases clear. Hepatobiliary: No focal liver abnormality is seen. Status post cholecystectomy. No biliary dilatation. Pancreas: Unremarkable. No pancreatic ductal dilatation or surrounding inflammatory changes. Spleen: Normal in size. No focal lesion. A few calcifications in the spleen are consistent with old granulomatous disease. Adrenals/Urinary Tract: Adrenal glands are unremarkable. Kidneys are normal, without renal calculi, focal lesion, or hydronephrosis. Bladder is distended but otherwise unremarkable. Stomach/Bowel: Stomach is within normal limits. Status post appendectomy. No evidence of bowel wall thickening, distention, or inflammatory changes. Vascular/Lymphatic: Aortic atherosclerosis.  No enlarged abdominal or pelvic lymph nodes. Reproductive: Uterus and bilateral adnexa are unremarkable. Other: None. Musculoskeletal: The patient has T11, T12 and L1 compression fractures. The T11 and L1 fractures are new since the prior thoracic spine CT. Vertebral body height loss at T11 is up to 80% anteriorly and there is approximately 60% vertebral body height loss at L1. Minimal retropulsion off the superior endplate of O87. Mild bony retropulsion is seen off the superior endplate of L1. Remote T12 compression fracture where the patient is status post vertebral augmentation noted. No other acute bony abnormality is seen. No lytic or sclerotic lesion. IMPRESSION: T11 and L1 compression fractures are new since the 04/16/2020 thoracic spine CT. Vertebral body height loss is worse at T11 where it is up to approximately 80%. There is mild bony retropulsion off the superior endplate of L1 which appears to cause mild central canal stenosis at T12-L1. Minimal retropulsion of seen off the  superior endplate of O67 Status post vertebral augmentation at L1 for compression fracture seen on the prior CT. Distended urinary bladder. Cause for this finding is not identified and may be incidental. Cardiomegaly. Aortic Atherosclerosis (ICD10-I70.0). Electronically Signed   By: Inge Rise M.D.   On: 08/18/2020 20:03   DG Chest Port 1 View  Result Date: 08/18/2020 CLINICAL DATA:  Diffuse weakness EXAM: PORTABLE CHEST 1 VIEW COMPARISON:  None. FINDINGS: Cardiac shadow is within normal limits. Lungs are well aerated bilaterally. No focal infiltrate or sizable effusion is noted. Aortic calcifications are seen. No bony abnormality is noted. IMPRESSION: No acute abnormality noted. Electronically Signed   By: Inez Catalina M.D.   On: 08/18/2020 18:07    Assessment/Plan Weight loss, abnormal CT scan of Abdomen was negative Failed Remeron No Other symptoms at this time Work with Dietory and Follow Depression, recurrent ([place]) Start on Wellbutrin 75 mg BID Continue Lexapro also Psych Referal in facility Age-related osteoporosis with current pathological fracture with routine healing, subsequent encounter With Compression Fractures On Evenity per her PCP Also on Neurontin for back pain Will need to Continue Thrombocytopenia ([place]) Will follow Vitamin D deficiency On Supplement Vitamin B12 deficiency On supplement   Family/ staff Communication:   Labs/tests ordered: CBC,BMP in 2 week

## 2020-08-26 ENCOUNTER — Encounter: Payer: Self-pay | Admitting: Nurse Practitioner

## 2020-08-26 NOTE — Progress Notes (Signed)
This encounter was created in error - please disregard.

## 2020-08-27 DIAGNOSIS — R4182 Altered mental status, unspecified: Secondary | ICD-10-CM | POA: Diagnosis not present

## 2020-08-27 DIAGNOSIS — G9340 Encephalopathy, unspecified: Secondary | ICD-10-CM | POA: Diagnosis not present

## 2020-08-27 DIAGNOSIS — R2681 Unsteadiness on feet: Secondary | ICD-10-CM | POA: Diagnosis not present

## 2020-08-27 DIAGNOSIS — R41841 Cognitive communication deficit: Secondary | ICD-10-CM | POA: Diagnosis not present

## 2020-08-27 DIAGNOSIS — R29898 Other symptoms and signs involving the musculoskeletal system: Secondary | ICD-10-CM | POA: Diagnosis not present

## 2020-08-27 DIAGNOSIS — M6281 Muscle weakness (generalized): Secondary | ICD-10-CM | POA: Diagnosis not present

## 2020-08-29 DIAGNOSIS — R2681 Unsteadiness on feet: Secondary | ICD-10-CM | POA: Diagnosis not present

## 2020-08-29 DIAGNOSIS — R4182 Altered mental status, unspecified: Secondary | ICD-10-CM | POA: Diagnosis not present

## 2020-08-29 DIAGNOSIS — R29898 Other symptoms and signs involving the musculoskeletal system: Secondary | ICD-10-CM | POA: Diagnosis not present

## 2020-08-29 DIAGNOSIS — R41841 Cognitive communication deficit: Secondary | ICD-10-CM | POA: Diagnosis not present

## 2020-08-29 DIAGNOSIS — M6281 Muscle weakness (generalized): Secondary | ICD-10-CM | POA: Diagnosis not present

## 2020-08-29 DIAGNOSIS — G9340 Encephalopathy, unspecified: Secondary | ICD-10-CM | POA: Diagnosis not present

## 2020-08-30 DIAGNOSIS — R29898 Other symptoms and signs involving the musculoskeletal system: Secondary | ICD-10-CM | POA: Diagnosis not present

## 2020-08-30 DIAGNOSIS — R4182 Altered mental status, unspecified: Secondary | ICD-10-CM | POA: Diagnosis not present

## 2020-08-30 DIAGNOSIS — M6281 Muscle weakness (generalized): Secondary | ICD-10-CM | POA: Diagnosis not present

## 2020-08-30 DIAGNOSIS — R41841 Cognitive communication deficit: Secondary | ICD-10-CM | POA: Diagnosis not present

## 2020-08-30 DIAGNOSIS — G9340 Encephalopathy, unspecified: Secondary | ICD-10-CM | POA: Diagnosis not present

## 2020-08-30 DIAGNOSIS — R2681 Unsteadiness on feet: Secondary | ICD-10-CM | POA: Diagnosis not present

## 2020-08-31 ENCOUNTER — Encounter: Payer: Self-pay | Admitting: Nurse Practitioner

## 2020-08-31 ENCOUNTER — Ambulatory Visit: Payer: Medicare Other | Admitting: Internal Medicine

## 2020-09-01 DIAGNOSIS — F329 Major depressive disorder, single episode, unspecified: Secondary | ICD-10-CM | POA: Diagnosis not present

## 2020-09-01 DIAGNOSIS — F0390 Unspecified dementia without behavioral disturbance: Secondary | ICD-10-CM | POA: Diagnosis not present

## 2020-09-01 DIAGNOSIS — R29898 Other symptoms and signs involving the musculoskeletal system: Secondary | ICD-10-CM | POA: Diagnosis not present

## 2020-09-01 DIAGNOSIS — M6281 Muscle weakness (generalized): Secondary | ICD-10-CM | POA: Diagnosis not present

## 2020-09-01 DIAGNOSIS — F419 Anxiety disorder, unspecified: Secondary | ICD-10-CM | POA: Diagnosis not present

## 2020-09-01 DIAGNOSIS — G9340 Encephalopathy, unspecified: Secondary | ICD-10-CM | POA: Diagnosis not present

## 2020-09-01 DIAGNOSIS — R2681 Unsteadiness on feet: Secondary | ICD-10-CM | POA: Diagnosis not present

## 2020-09-01 DIAGNOSIS — G4700 Insomnia, unspecified: Secondary | ICD-10-CM | POA: Diagnosis not present

## 2020-09-01 DIAGNOSIS — R41841 Cognitive communication deficit: Secondary | ICD-10-CM | POA: Diagnosis not present

## 2020-09-01 DIAGNOSIS — R4182 Altered mental status, unspecified: Secondary | ICD-10-CM | POA: Diagnosis not present

## 2020-09-03 ENCOUNTER — Inpatient Hospital Stay (HOSPITAL_COMMUNITY)
Admission: EM | Admit: 2020-09-03 | Discharge: 2020-09-07 | DRG: 522 | Disposition: A | Discharge status: Skilled Nursing Facility | Payer: Medicare Other | Source: Skilled Nursing Facility | Attending: Internal Medicine | Admitting: Internal Medicine

## 2020-09-03 ENCOUNTER — Emergency Department (HOSPITAL_COMMUNITY): Payer: Medicare Other

## 2020-09-03 ENCOUNTER — Encounter (HOSPITAL_COMMUNITY): Payer: Self-pay

## 2020-09-03 ENCOUNTER — Other Ambulatory Visit: Payer: Self-pay

## 2020-09-03 DIAGNOSIS — Z885 Allergy status to narcotic agent status: Secondary | ICD-10-CM | POA: Diagnosis not present

## 2020-09-03 DIAGNOSIS — W19XXXA Unspecified fall, initial encounter: Secondary | ICD-10-CM | POA: Diagnosis not present

## 2020-09-03 DIAGNOSIS — G309 Alzheimer's disease, unspecified: Secondary | ICD-10-CM | POA: Diagnosis not present

## 2020-09-03 DIAGNOSIS — S72009A Fracture of unspecified part of neck of unspecified femur, initial encounter for closed fracture: Secondary | ICD-10-CM

## 2020-09-03 DIAGNOSIS — M6281 Muscle weakness (generalized): Secondary | ICD-10-CM | POA: Diagnosis not present

## 2020-09-03 DIAGNOSIS — Z20822 Contact with and (suspected) exposure to covid-19: Secondary | ICD-10-CM | POA: Diagnosis not present

## 2020-09-03 DIAGNOSIS — Y92129 Unspecified place in nursing home as the place of occurrence of the external cause: Secondary | ICD-10-CM

## 2020-09-03 DIAGNOSIS — R0902 Hypoxemia: Secondary | ICD-10-CM | POA: Diagnosis not present

## 2020-09-03 DIAGNOSIS — Z471 Aftercare following joint replacement surgery: Secondary | ICD-10-CM | POA: Diagnosis not present

## 2020-09-03 DIAGNOSIS — Z85828 Personal history of other malignant neoplasm of skin: Secondary | ICD-10-CM

## 2020-09-03 DIAGNOSIS — E559 Vitamin D deficiency, unspecified: Secondary | ICD-10-CM | POA: Diagnosis present

## 2020-09-03 DIAGNOSIS — R636 Underweight: Secondary | ICD-10-CM | POA: Diagnosis not present

## 2020-09-03 DIAGNOSIS — M25561 Pain in right knee: Secondary | ICD-10-CM | POA: Diagnosis not present

## 2020-09-03 DIAGNOSIS — R29898 Other symptoms and signs involving the musculoskeletal system: Secondary | ICD-10-CM | POA: Diagnosis not present

## 2020-09-03 DIAGNOSIS — Z96641 Presence of right artificial hip joint: Secondary | ICD-10-CM | POA: Diagnosis not present

## 2020-09-03 DIAGNOSIS — Z79899 Other long term (current) drug therapy: Secondary | ICD-10-CM | POA: Diagnosis not present

## 2020-09-03 DIAGNOSIS — Z9181 History of falling: Secondary | ICD-10-CM | POA: Diagnosis not present

## 2020-09-03 DIAGNOSIS — M810 Age-related osteoporosis without current pathological fracture: Secondary | ICD-10-CM | POA: Diagnosis present

## 2020-09-03 DIAGNOSIS — R41841 Cognitive communication deficit: Secondary | ICD-10-CM | POA: Diagnosis not present

## 2020-09-03 DIAGNOSIS — F0390 Unspecified dementia without behavioral disturbance: Secondary | ICD-10-CM | POA: Diagnosis present

## 2020-09-03 DIAGNOSIS — R531 Weakness: Secondary | ICD-10-CM | POA: Diagnosis not present

## 2020-09-03 DIAGNOSIS — M25551 Pain in right hip: Secondary | ICD-10-CM | POA: Diagnosis not present

## 2020-09-03 DIAGNOSIS — S72011A Unspecified intracapsular fracture of right femur, initial encounter for closed fracture: Principal | ICD-10-CM | POA: Diagnosis present

## 2020-09-03 DIAGNOSIS — Z888 Allergy status to other drugs, medicaments and biological substances status: Secondary | ICD-10-CM | POA: Diagnosis not present

## 2020-09-03 DIAGNOSIS — F419 Anxiety disorder, unspecified: Secondary | ICD-10-CM | POA: Diagnosis not present

## 2020-09-03 DIAGNOSIS — S72001A Fracture of unspecified part of neck of right femur, initial encounter for closed fracture: Secondary | ICD-10-CM

## 2020-09-03 DIAGNOSIS — Z8249 Family history of ischemic heart disease and other diseases of the circulatory system: Secondary | ICD-10-CM

## 2020-09-03 DIAGNOSIS — R2681 Unsteadiness on feet: Secondary | ICD-10-CM | POA: Diagnosis not present

## 2020-09-03 DIAGNOSIS — R9431 Abnormal electrocardiogram [ECG] [EKG]: Secondary | ICD-10-CM | POA: Diagnosis not present

## 2020-09-03 DIAGNOSIS — R42 Dizziness and giddiness: Secondary | ICD-10-CM | POA: Diagnosis not present

## 2020-09-03 DIAGNOSIS — Z681 Body mass index (BMI) 19 or less, adult: Secondary | ICD-10-CM | POA: Diagnosis not present

## 2020-09-03 DIAGNOSIS — S72011D Unspecified intracapsular fracture of right femur, subsequent encounter for closed fracture with routine healing: Secondary | ICD-10-CM | POA: Diagnosis not present

## 2020-09-03 DIAGNOSIS — I119 Hypertensive heart disease without heart failure: Secondary | ICD-10-CM | POA: Diagnosis present

## 2020-09-03 DIAGNOSIS — Z7401 Bed confinement status: Secondary | ICD-10-CM | POA: Diagnosis not present

## 2020-09-03 DIAGNOSIS — G319 Degenerative disease of nervous system, unspecified: Secondary | ICD-10-CM | POA: Diagnosis not present

## 2020-09-03 DIAGNOSIS — F32A Depression, unspecified: Secondary | ICD-10-CM | POA: Diagnosis not present

## 2020-09-03 DIAGNOSIS — S0990XA Unspecified injury of head, initial encounter: Secondary | ICD-10-CM | POA: Diagnosis not present

## 2020-09-03 DIAGNOSIS — I10 Essential (primary) hypertension: Secondary | ICD-10-CM | POA: Diagnosis not present

## 2020-09-03 DIAGNOSIS — E785 Hyperlipidemia, unspecified: Secondary | ICD-10-CM | POA: Diagnosis not present

## 2020-09-03 DIAGNOSIS — W010XXA Fall on same level from slipping, tripping and stumbling without subsequent striking against object, initial encounter: Secondary | ICD-10-CM | POA: Diagnosis not present

## 2020-09-03 DIAGNOSIS — F0280 Dementia in other diseases classified elsewhere without behavioral disturbance: Secondary | ICD-10-CM | POA: Diagnosis present

## 2020-09-03 LAB — I-STAT CHEM 8, ED
BUN: 16 mg/dL (ref 8–23)
Calcium, Ion: 1.16 mmol/L (ref 1.15–1.40)
Chloride: 100 mmol/L (ref 98–111)
Creatinine, Ser: 0.5 mg/dL (ref 0.44–1.00)
Glucose, Bld: 86 mg/dL (ref 70–99)
HCT: 41 % (ref 36.0–46.0)
Hemoglobin: 13.9 g/dL (ref 12.0–15.0)
Potassium: 3.7 mmol/L (ref 3.5–5.1)
Sodium: 136 mmol/L (ref 135–145)
TCO2: 26 mmol/L (ref 22–32)

## 2020-09-03 LAB — CBC WITH DIFFERENTIAL/PLATELET
Abs Immature Granulocytes: 0.04 10*3/uL (ref 0.00–0.07)
Basophils Absolute: 0 10*3/uL (ref 0.0–0.1)
Basophils Relative: 0 %
Eosinophils Absolute: 0 10*3/uL (ref 0.0–0.5)
Eosinophils Relative: 0 %
HCT: 41.5 % (ref 36.0–46.0)
Hemoglobin: 13.4 g/dL (ref 12.0–15.0)
Immature Granulocytes: 0 %
Lymphocytes Relative: 4 %
Lymphs Abs: 0.5 10*3/uL — ABNORMAL LOW (ref 0.7–4.0)
MCH: 30.6 pg (ref 26.0–34.0)
MCHC: 32.3 g/dL (ref 30.0–36.0)
MCV: 94.7 fL (ref 80.0–100.0)
Monocytes Absolute: 0.7 10*3/uL (ref 0.1–1.0)
Monocytes Relative: 6 %
Neutro Abs: 11 10*3/uL — ABNORMAL HIGH (ref 1.7–7.7)
Neutrophils Relative %: 90 %
Platelets: 165 10*3/uL (ref 150–400)
RBC: 4.38 MIL/uL (ref 3.87–5.11)
RDW: 13.2 % (ref 11.5–15.5)
WBC: 12.4 10*3/uL — ABNORMAL HIGH (ref 4.0–10.5)
nRBC: 0 % (ref 0.0–0.2)

## 2020-09-03 LAB — URINALYSIS, ROUTINE W REFLEX MICROSCOPIC
Bilirubin Urine: NEGATIVE
Glucose, UA: NEGATIVE mg/dL
Hgb urine dipstick: NEGATIVE
Ketones, ur: 80 mg/dL — AB
Leukocytes,Ua: NEGATIVE
Nitrite: NEGATIVE
Protein, ur: NEGATIVE mg/dL
Specific Gravity, Urine: 1.02 (ref 1.005–1.030)
pH: 6 (ref 5.0–8.0)

## 2020-09-03 LAB — SARS CORONAVIRUS 2 (TAT 6-24 HRS): SARS Coronavirus 2: NEGATIVE

## 2020-09-03 MED ORDER — MORPHINE SULFATE (PF) 2 MG/ML IV SOLN
0.5000 mg | INTRAVENOUS | Status: DC | PRN
  Administered 2020-09-03: 0.5 mg via INTRAVENOUS
  Filled 2020-09-03: qty 1

## 2020-09-03 MED ORDER — MORPHINE SULFATE (PF) 2 MG/ML IV SOLN
2.0000 mg | Freq: Once | INTRAVENOUS | Status: AC
  Administered 2020-09-03: 2 mg via INTRAVENOUS
  Filled 2020-09-03: qty 1

## 2020-09-03 MED ORDER — FENTANYL CITRATE (PF) 100 MCG/2ML IJ SOLN
50.0000 ug | Freq: Once | INTRAMUSCULAR | Status: AC
  Administered 2020-09-03: 50 ug via INTRAVENOUS
  Filled 2020-09-03: qty 2

## 2020-09-03 MED ORDER — MELATONIN 5 MG PO TABS
10.0000 mg | ORAL_TABLET | Freq: Every day | ORAL | Status: DC
  Administered 2020-09-03 – 2020-09-07 (×5): 10 mg via ORAL
  Filled 2020-09-03 (×5): qty 2

## 2020-09-03 MED ORDER — ESCITALOPRAM OXALATE 10 MG PO TABS
10.0000 mg | ORAL_TABLET | Freq: Every evening | ORAL | Status: DC
  Administered 2020-09-05 – 2020-09-07 (×3): 10 mg via ORAL
  Filled 2020-09-03 (×6): qty 1

## 2020-09-03 MED ORDER — LORAZEPAM 2 MG/ML IJ SOLN: 0.5000 mg | INTRAMUSCULAR | Status: DC | PRN

## 2020-09-03 MED ORDER — FENTANYL CITRATE (PF) 100 MCG/2ML IJ SOLN
50.0000 ug | INTRAMUSCULAR | Status: DC | PRN
  Administered 2020-09-03 – 2020-09-04 (×4): 50 ug via INTRAVENOUS
  Filled 2020-09-03 (×4): qty 2

## 2020-09-03 MED ORDER — SODIUM CHLORIDE 0.9 % IV BOLUS
1000.0000 mL | Freq: Once | INTRAVENOUS | Status: AC
  Administered 2020-09-03: 1000 mL via INTRAVENOUS

## 2020-09-03 MED ORDER — METHOCARBAMOL 1000 MG/10ML IJ SOLN
500.0000 mg | Freq: Three times a day (TID) | INTRAVENOUS | Status: DC | PRN
  Administered 2020-09-03: 500 mg via INTRAVENOUS
  Filled 2020-09-03: qty 5

## 2020-09-03 MED ORDER — CYANOCOBALAMIN 250 MCG PO TABS
500.0000 ug | ORAL_TABLET | Freq: Every day | ORAL | Status: DC
  Administered 2020-09-04 – 2020-09-07 (×4): 500 ug via ORAL
  Filled 2020-09-03 (×4): qty 2

## 2020-09-03 MED ORDER — GABAPENTIN 100 MG PO CAPS
100.0000 mg | ORAL_CAPSULE | Freq: Three times a day (TID) | ORAL | Status: DC
  Administered 2020-09-04 – 2020-09-07 (×11): 100 mg via ORAL
  Filled 2020-09-03 (×16): qty 1

## 2020-09-03 MED ORDER — DOCUSATE SODIUM 100 MG PO CAPS: 100.0000 mg | ORAL_CAPSULE | Freq: Every day | ORAL | Status: DC

## 2020-09-03 MED ORDER — FOLIC ACID 1 MG PO TABS
0.5000 mg | ORAL_TABLET | Freq: Every day | ORAL | Status: DC
  Administered 2020-09-04 – 2020-09-07 (×4): 0.5 mg via ORAL
  Filled 2020-09-03 (×4): qty 1

## 2020-09-03 MED ORDER — VITAMIN B12-FOLIC ACID 500-400 MCG PO TABS: ORAL_TABLET | Freq: Every morning | ORAL | Status: DC

## 2020-09-03 NOTE — ED Provider Notes (Signed)
[facility] DEPT Provider Note   CSN: [id number] Arrival date & time: 09/03/20  [id number]     History No chief complaint on file.   Tanya Walker is a 80 y.o. female.  The history is provided by the patient and medical records. No language interpreter was used.   80 year old female significant history of Alzheimer's, malnutrition, osteoporosis brought here via EMS from a memory care unit for evaluation of a fall.  Per EMS note, when staff went to check on patient at 430 this morning which is approximately 2 hours ago, she reports she fell around midnight but she was able to get herself up and into bed without calling for staff.  At that time she did report he had the back of her head but denies any significant headache or neck pain.  She is not on any blood thinner medication.  EMS placed a c-collar and brought patient here.  Patient report she woke up around 12:30 in the morning, and decided to get ready for the day by putting on her close.  She believes she may have turned quickly, lost her balance and fell.  At this time her primary complaint is pain to her right hip and right knee.  Pain is moderate in intensity, sharp, worsening with movement.  She denies any significant headache, neck pain, chest pain, trouble breathing, abdominal pain or any significant back pain.  She denies any precipitating symptoms prior to the fall.  She currently rates the pain 6 out of 10.  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   Depression    years ago   Headache    sinus headaches   Lipoma of arm    Right   Medical history non-contributory    Osteoporosis    SVD (spontaneous vaginal delivery)    x 2   Varicose vein    Vitamin D deficiency     Patient Active Problem List   Diagnosis Date Noted   Alzheimer disease ([place]) 08/21/2020   Vitamin B12 deficiency 08/21/2020   Hyperlipidemia 08/21/2020    Depression, recurrent ([place]) 08/21/2020   Protein-calorie malnutrition, severe 08/19/2020   Encephalopathy acute 08/19/2020   Altered mental status 08/18/2020   Prolonged QT interval 08/18/2020   Thrombocytopenia ([place]) 08/18/2020   Failure to thrive in adult 08/18/2020   Physical deconditioning 08/18/2020   Compression fracture of body of thoracic vertebra ([place]) 06/08/2020   Low back pain 03/27/2020   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   Pain of left hand 02/14/2019   Tibialis posterior tendinitis, right 07/08/2016   Ankle sprain 06/30/2016   Peripheral vascular disease ([place]) 04/03/2016   Ankle pain, left 03/31/2016   Sore in nose 03/31/2016   Well adult exam 09/25/2015   Adjustment disorder with mixed anxiety and depressed mood 02/03/2015   Allergic rhinitis 05/29/2014   Rash 06/10/2013   URI (upper respiratory infection) 03/08/2012   Lipoma 11/04/2009   PARESTHESIA 12/26/2008   Acute maxillary sinusitis 11/22/2007   Weight loss, abnormal 08/24/2007   Vitamin D deficiency 02/14/2007   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   Osteoporosis 09/01/2006    Past Surgical History:  Procedure Laterality Date   [place]     x2 left and right arm   HYSTEROSCOPY WITH D &  C N/A 07/15/2013   Procedure: DILATATION AND CURETTAGE /HYSTEROSCOPY;  Surgeon: Lovenia Kim, MD;  Location: [facility];  Service: Gynecology;  Laterality: N/A;   KYPHOPLASTY Bilateral 05/04/2020   Procedure: KYPHOPLASTY THORACIC TWELVE;  Surgeon: Vallarie Mare, MD;  Location: [facility];  Service: Neurosurgery;  Laterality: Bilateral;   MANDIBLE SURGERY  1954   WISDOM TOOTH EXTRACTION       OB History   No obstetric history on file.     Family History  Problem Relation Age of Onset   Hypertension Mother    Heart disease Father    Cancer Father     Social History   Tobacco Use   Smoking status: Never   Smokeless tobacco: Never   Vaping Use   Vaping Use: Never used  Substance Use Topics   Alcohol use: No   Drug use: No    Home Medications Prior to Admission medications   Medication Sig Start Date End Date Taking? Authorizing Provider  acetaminophen (TYLENOL) 500 MG tablet Take 1,000 mg by mouth every 6 (six) hours as needed for moderate pain, mild pain, headache or fever.    [provider]  buPROPion (WELLBUTRIN) 75 MG tablet Take 75 mg by mouth 2 (two) times daily.    [provider]  cetirizine (ZYRTEC ALLERGY) 10 MG tablet Take 1 tablet (10 mg total) by mouth daily. 10/28/19 10/27/20  Plotnikov, [name], MD  Cholecalciferol (VITAMIN D3) 2000 units capsule Take 1 capsule (2,000 Units total) by mouth daily. 12/05/16   Plotnikov, [name], MD  Cobalamin Combinations (VITAMIN B12-FOLIC ACID PO) Place 1 lozenge under the tongue in the morning.    [provider]  docusate sodium (COLACE) 100 MG capsule Take 100 mg by mouth at bedtime.    [provider]  escitalopram (LEXAPRO) 10 MG tablet Take 10 mg by mouth every evening. 07/28/20   [provider]  gabapentin (NEURONTIN) 100 MG capsule Take 1 capsule (100 mg total) by mouth 3 (three) times daily. 06/08/20   Plotnikov, [name], MD  Melatonin 10 MG TABS Take 10 mg by mouth at bedtime.    [provider]  Multiple Vitamins-Minerals (PRESERVISION AREDS 2 PO) Take 1 capsule by mouth 2 (two) times daily.    [provider]  SALONPAS 3.02-13-08 % PTCH Apply 1 patch topically See admin instructions. Apply 1 patch to the lower back and remove (at most) after 8 hours- once a day    [provider]    Allergies    Keflex [cephalexin], Actonel [risedronate], Alendronate sodium, Risedronate sodium, and Tramadol  Review of Systems   Review of Systems  All other systems reviewed and are negative.  Physical Exam Updated Vital Signs BP (!) 148/61 (BP Location: Left Arm)   Pulse 76   Temp 98 F (36.7  C) (Oral)   Resp 15   Ht '5\' 4"'$  (1.626 m)   Wt 40.8 kg   SpO2 98%   BMI 15.45 kg/m   Physical Exam Vitals and nursing note reviewed.  Constitutional:      General: She is not in acute distress.    Appearance: She is well-developed.     Comments: Frail-appearing elderly female laying in bed wearing c-collar in no acute discomfort  HENT:     Head: Normocephalic and atraumatic.     Comments: No scalp tenderness on palpation. Eyes:     Conjunctiva/sclera: Conjunctivae normal.  Neck:     Comments: C-collar removed.  No  cervical midline spine tenderness crepitus or step-off Cardiovascular:     Rate and Rhythm: Normal rate and regular rhythm.     Pulses: Normal pulses.     Heart sounds: Normal heart sounds.  Pulmonary:     Effort: Pulmonary effort is normal.  Abdominal:     Palpations: Abdomen is soft.     Tenderness: There is no abdominal tenderness.  Musculoskeletal:        General: Tenderness (Right hip: Exquisite tenderness to palpation of the hip with decreased range of motion secondary to pain, no obvious deformity noted.) present.     Cervical back: Normal range of motion and neck supple.     Comments: Right lower extremity: And tenderness along right knee and proximal thigh without deformity.  Decreased range of motion.  No significant midline spine tenderness.  Skin:    Findings: No rash.  Neurological:     Mental Status: She is alert and oriented to person, place, and time.  Psychiatric:        Mood and Affect: Mood normal.    ED Results / Procedures / Treatments   Labs (all labs ordered are listed, but only abnormal results are displayed) Labs Reviewed  CBC WITH DIFFERENTIAL/PLATELET - Abnormal; Notable for the following components:      Result Value   WBC 12.4 (*)    Neutro Abs 11.0 (*)    Lymphs Abs 0.5 (*)    All other components within normal limits  URINALYSIS, ROUTINE W REFLEX MICROSCOPIC - Abnormal; Notable for the following components:   Ketones, ur  80 (*)    All other components within normal limits  SARS CORONAVIRUS 2 (TAT 6-24 HRS)  I-STAT CHEM 8, ED    EKG EKG Interpretation  Date/Time:  Thursday September 03 2020 06:21:05 EDT Ventricular Rate:  73 PR Interval:  148 QRS Duration: 116 QT Interval:  415 QTC Calculation: 458 R Axis:   82 Text Interpretation: Sinus rhythm Nonspecific intraventricular conduction delay Nonspecific T abnrm, anterolateral leads Minimal ST elevation, anterior leads Confirmed by Orpah Greek [phone]) on 09/03/2020 6:54:00 AM  Radiology CT Head Wo Contrast  Result Date: 09/03/2020 CLINICAL DATA:  80 year old female status post unwitnessed fall, posterior head injury. EXAM: CT HEAD WITHOUT CONTRAST TECHNIQUE: Contiguous axial images were obtained from the base of the skull through the vertex without intravenous contrast. COMPARISON:  Head CT 08/18/2020. FINDINGS: Brain: Stable cerebral volume. No midline shift, ventriculomegaly, mass effect, evidence of mass lesion, intracranial hemorrhage or evidence of cortically based acute infarction. Patchy and confluent bilateral white matter hypodensity is stable. Small perivascular space left posterior lentiform, normal variant. Vascular: Calcified atherosclerosis at the skull base. No suspicious intracranial vascular hyperdensity. Skull: Mild motion artifact today.  Stable, no fracture identified. Sinuses/Orbits: Visualized paranasal sinuses and mastoids are stable and well aerated. Other: No discrete scalp or orbits soft tissue injury identified. IMPRESSION: 1. No acute traumatic injury identified. 2. Stable non contrast CT appearance of chronic white matter disease. Electronically Signed   By: Genevie Ann M.D.   On: 09/03/2020 08:26   CT Hip Right Wo Contrast  Result Date: 09/03/2020 CLINICAL DATA:  Right hip pain since the patient suffered a fall last night. Initial encounter. EXAM: CT OF THE RIGHT HIP WITHOUT CONTRAST TECHNIQUE: Multidetector CT imaging of the right  hip was performed according to the standard protocol. Multiplanar CT image reconstructions were also generated. COMPARISON:  Plain films right hip earlier today. FINDINGS: Bones/Joint/Cartilage The patient has an acute subcapital  fracture of the right hip. The fracture is mildly impacted and there is some medial displacement the neck of the femur. No other acute abnormality is seen. No focal lesion. Small right hip joint effusion due to the fracture is noted. Ligaments Suboptimally assessed by CT. Muscles and Tendons Intact. Soft tissues Negative. There is some stranding in subcutaneous fat about the right hip likely due to contusion. IMPRESSION: Acute subcapital fracture right hip as described. Electronically Signed   By: Inge Rise M.D.   On: 09/03/2020 09:22   DG Knee Complete 4 Views Right  Result Date: 09/03/2020 CLINICAL DATA:  Pain status post fall. EXAM: RIGHT KNEE - COMPLETE 4+ VIEW COMPARISON:  None. FINDINGS: No evidence of fracture, dislocation, or joint effusion. No evidence of arthropathy or other focal bone abnormality. Soft tissues are unremarkable. IMPRESSION: No acute fracture or dislocation of the right knee. Electronically Signed   By: Miachel Roux M.D.   On: 09/03/2020 07:53   DG Hip Unilat W or Wo Pelvis 2-3 Views Right  Result Date: 09/03/2020 CLINICAL DATA:  Diffuse right hip pain status post fall EXAM: DG HIP (WITH OR WITHOUT PELVIS) 2-3V RIGHT COMPARISON:  None. FINDINGS: There is subtle lucency in the right femoral neck suspicious for nondisplaced fracture. Evaluation is significantly limited due to patient positioning and overlying structures. IMPRESSION: Subtle lucency in the right femoral neck is suspicious for nondisplaced fracture. Current images are limited. Further evaluation with CT would be beneficial. Electronically Signed   By: Miachel Roux M.D.   On: 09/03/2020 07:52    Procedures Procedures   Medications Ordered in ED Medications  escitalopram (LEXAPRO)  tablet 10 mg (has no administration in time range)  docusate sodium (COLACE) capsule 100 mg (has no administration in time range)  Vitamin B12-Folic Acid [id number] MCG TABS (has no administration in time range)  Melatonin TABS 10 mg (has no administration in time range)  gabapentin (NEURONTIN) capsule 100 mg (has no administration in time range)  morphine 2 MG/ML injection 0.5 mg (has no administration in time range)  morphine 2 MG/ML injection 2 mg (2 mg Intravenous Given 09/03/20 0701)  fentaNYL (SUBLIMAZE) injection 50 mcg (50 mcg Intravenous Given 09/03/20 0955)  sodium chloride 0.9 % bolus 1,000 mL (1,000 mLs Intravenous New Bag/Given 09/03/20 0955)    ED Course  I have reviewed the triage vital signs and the nursing notes.  Pertinent labs & imaging results that were available during my care of the patient were reviewed by me and considered in my medical decision making (see chart for details).  Clinical Course as of 09/03/20 1115  Thu Sep 04, 8963  [id number] 80 year old female resident of memory care unit here after a fall earlier this evening.  She has some tenderness over her right hip.  X-ray not definitive but CT showing subcapital fracture.  She will need orthopedic consult and admission to the hospital for further management. [MB]    Clinical Course User Index [MB] Hayden Rasmussen, MD   MDM Rules/Calculators/A&P                           BP (!) 146/75   Pulse 84   Temp 98 F (36.7 C) (Oral)   Resp 19   Ht '5\' 4"'$  (1.626 m)   Wt 40.8 kg   SpO2 99%   BMI 15.45 kg/m   Final Clinical Impression(s) / ED Diagnoses Final diagnoses:  Closed right hip fracture, initial encounter (  [place])    Rx / DC Orders ED Discharge Orders     None      6:54 AM Patient fell approximately 6 hours ago while she was at a memory care unit.  It appears to be a mechanical fall from turn around too quickly.  Patient still able to answer questions appropriately she is alert oriented x3.  She does  endorse quadrant of pain to right hip and knee, will obtain x-ray.  She did report hitting the back of her head but no loss of consciousness.  Will obtain head CT scan.  She does not have any cervical spine tenderness on exam she is not on any blood thinner medication. Care discussed with DR. Butler.   9:39 AM Initial right hip x-ray demonstrated subtle lucency in the right femoral neck suspicious for nondisplaced fracture.  A right hip CT scan was obtained which demonstrate acute subcapital fracture right hip.  Plan to reach out to orthopedist and will also consult medicine for admission.  Screening COVID test ordered, additional pain medication ordered.  11:02 AM Appreciate consultation from [facility] who agrees to have patient admitted to medicine and anticipate right hip replacement sometime tomorrow.  11:15 AM Appreciate consultation from [facility] DR. Glyn Ade who agrees to see and will admit pt.  I have also called pt's family member, her husband, to give an update.     Domenic Moras, PA-C 09/03/20 1116    Hayden Rasmussen, MD 09/03/20 [phone]

## 2020-09-03 NOTE — Consult Note (Signed)
ORTHOPAEDIC CONSULTATION  REQUESTING PHYSICIAN: Rise Patience, MD  PCP:  Cassandria Anger, MD  Chief Complaint: right hip pain   HPI: Tanya Walker is a 80 y.o. female who complains of right hip pain after a fall that is estimated to be around midnight of 09/03/2020.  Patient lives in a memory care facility.  She does have a past medical history of Alzheimer's, malnutrition, osteoporosis.  Patient reports pain of her right hip and right knee especially with any palpation or movement.  Reports some mild pain when resting completely still.  Denies fever or chills.  Denies numbness or tingling.  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   Depression    years ago   Headache    sinus headaches   Lipoma of arm    Right   Medical history non-contributory    Osteoporosis    SVD (spontaneous vaginal delivery)    x 2   Varicose vein    Vitamin D deficiency    Past Surgical History:  Procedure Laterality Date   [place]     x2 left and right arm   HYSTEROSCOPY WITH D & C N/A 07/15/2013   Procedure: DILATATION AND CURETTAGE /HYSTEROSCOPY;  Surgeon: Lovenia Kim, MD;  Location: [facility];  Service: Gynecology;  Laterality: N/A;   KYPHOPLASTY Bilateral 05/04/2020   Procedure: KYPHOPLASTY THORACIC TWELVE;  Surgeon: Vallarie Mare, MD;  Location: [facility];  Service: Neurosurgery;  Laterality: Bilateral;   MANDIBLE SURGERY  1954   WISDOM TOOTH EXTRACTION     Social History   Socioeconomic History   Marital status: Married    Spouse name: Not on file   Number of children: 1   Years of education: Not on file   Highest education level: Not on file  Occupational History   Occupation: retired  Tobacco Use   Smoking status: Never   Smokeless tobacco: Never  Vaping Use   Vaping Use: Never used  Substance and Sexual Activity   Alcohol  use: No   Drug use: No   Sexual activity: Yes    Birth control/protection: Post-menopausal  Other Topics Concern   Not on file  Social History Narrative   GI in [place] for colonoscopy   Dr Ronita Hipps - GYN      Denies Surgical history      Family history of varicose veins   Mother is 95      Regular exercise - NO   Social Determinants of Health   Financial Resource Strain: Low Risk    Difficulty of Paying Living Expenses: Not hard at all  Food Insecurity: No Food Insecurity   Worried About Charity fundraiser in the Last Year: Never true   [place] in the Last Year: Never true  Transportation Needs: No Transportation Needs   Lack of Transportation (Medical): No   Lack of Transportation (Non-Medical): No  Physical Activity: Sufficiently Active   Days of Exercise per Week: 5 days   Minutes of Exercise per Session: 30 min  Stress: Stress Concern Present   Feeling of Stress : Rather much  Social Connections: Moderately Integrated   Frequency of Communication with Friends and Family: More than three times a week   Frequency of Social Gatherings with  Friends and Family: Never   Attends Religious Services: Never   Marine scientist or Organizations: Yes   Attends Music therapist: Never   Marital Status: Married   Family History  Problem Relation Age of Onset   Hypertension Mother    Heart disease Father    Cancer Father    Allergies  Allergen Reactions   Keflex [Cephalexin] Other (See Comments)    "Allergic," per MAR   Actonel [Risedronate] Other (See Comments)    "Allergic," per MAR   Alendronate Sodium Other (See Comments)    Leg cramps and "Allergic," per MAR   Risedronate Sodium Other (See Comments)    Caused the patient to be achy and is "Allergic," per MAR   Tramadol Other (See Comments)    Caused the patient to feel badly    Prior to Admission medications   Medication Sig Start Date End Date Taking? Authorizing Provider  acetaminophen  (TYLENOL) 500 MG tablet Take 1,000 mg by mouth every 6 (six) hours as needed for moderate pain, mild pain, headache or fever.    [provider]  buPROPion (WELLBUTRIN) 75 MG tablet Take 75 mg by mouth 2 (two) times daily.    [provider]  cetirizine (ZYRTEC ALLERGY) 10 MG tablet Take 1 tablet (10 mg total) by mouth daily. 10/28/19 10/27/20  Plotnikov, [name], MD  Cholecalciferol (VITAMIN D3) 2000 units capsule Take 1 capsule (2,000 Units total) by mouth daily. 12/05/16   Plotnikov, [name], MD  Cobalamin Combinations (VITAMIN B12-FOLIC ACID PO) Place 1 lozenge under the tongue in the morning.    [provider]  docusate sodium (COLACE) 100 MG capsule Take 100 mg by mouth at bedtime.    [provider]  escitalopram (LEXAPRO) 10 MG tablet Take 10 mg by mouth every evening. 07/28/20   [provider]  gabapentin (NEURONTIN) 100 MG capsule Take 1 capsule (100 mg total) by mouth 3 (three) times daily. 06/08/20   Plotnikov, [name], MD  Melatonin 10 MG TABS Take 10 mg by mouth at bedtime.    [provider]  Multiple Vitamins-Minerals (PRESERVISION AREDS 2 PO) Take 1 capsule by mouth 2 (two) times daily.    [provider]  SALONPAS 3.02-13-08 % PTCH Apply 1 patch topically See admin instructions. Apply 1 patch to the lower back and remove (at most) after 8 hours- once a day    [provider]   CT Head Wo Contrast  Result Date: 09/03/2020 CLINICAL DATA:  80 year old female status post unwitnessed fall, posterior head injury. EXAM: CT HEAD WITHOUT CONTRAST TECHNIQUE: Contiguous axial images were obtained from the base of the skull through the vertex without intravenous contrast. COMPARISON:  Head CT 08/18/2020. FINDINGS: Brain: Stable cerebral volume. No midline shift, ventriculomegaly, mass effect, evidence of mass lesion, intracranial hemorrhage or evidence of cortically based acute infarction. Patchy and confluent bilateral  white matter hypodensity is stable. Small perivascular space left posterior lentiform, normal variant. Vascular: Calcified atherosclerosis at the skull base. No suspicious intracranial vascular hyperdensity. Skull: Mild motion artifact today.  Stable, no fracture identified. Sinuses/Orbits: Visualized paranasal sinuses and mastoids are stable and well aerated. Other: No discrete scalp or orbits soft tissue injury identified. IMPRESSION: 1. No acute traumatic injury identified. 2. Stable non contrast CT appearance of chronic white matter disease. Electronically Signed   By: Genevie Ann M.D.   On: 09/03/2020 08:26   CT Hip Right Wo Contrast  Result Date: 09/03/2020 CLINICAL DATA:  Right hip pain since the patient suffered a fall last night. Initial encounter. EXAM: CT OF THE RIGHT HIP WITHOUT CONTRAST TECHNIQUE: Multidetector CT imaging of the right hip was performed according to the standard protocol. Multiplanar CT image reconstructions were also generated. COMPARISON:  Plain films right hip earlier today. FINDINGS: Bones/Joint/Cartilage The patient has an acute subcapital fracture of the right hip. The fracture is mildly impacted and there is some medial displacement the neck of the femur. No other acute abnormality is seen. No focal lesion. Small right hip joint effusion due to the fracture is noted. Ligaments Suboptimally assessed by CT. Muscles and Tendons Intact. Soft tissues Negative. There is some stranding in subcutaneous fat about the right hip likely due to contusion. IMPRESSION: Acute subcapital fracture right hip as described. Electronically Signed   By: Inge Rise M.D.   On: 09/03/2020 09:22   DG Knee Complete 4 Views Right  Result Date: 09/03/2020 CLINICAL DATA:  Pain status post fall. EXAM: RIGHT KNEE - COMPLETE 4+ VIEW COMPARISON:  None. FINDINGS: No evidence of fracture, dislocation, or joint effusion. No evidence of arthropathy or other focal bone abnormality. Soft tissues are  unremarkable. IMPRESSION: No acute fracture or dislocation of the right knee. Electronically Signed   By: Miachel Roux M.D.   On: 09/03/2020 07:53   DG Hip Unilat W or Wo Pelvis 2-3 Views Right  Result Date: 09/03/2020 CLINICAL DATA:  Diffuse right hip pain status post fall EXAM: DG HIP (WITH OR WITHOUT PELVIS) 2-3V RIGHT COMPARISON:  None. FINDINGS: There is subtle lucency in the right femoral neck suspicious for nondisplaced fracture. Evaluation is significantly limited due to patient positioning and overlying structures. IMPRESSION: Subtle lucency in the right femoral neck is suspicious for nondisplaced fracture. Current images are limited. Further evaluation with CT would be beneficial. Electronically Signed   By: Miachel Roux M.D.   On: 09/03/2020 07:52    Positive ROS: All other systems have been reviewed and were otherwise negative with the exception of those mentioned in the HPI and as above.  Physical Exam: General: Alert, no acute distress Cardiovascular: No pedal edema Respiratory: No cyanosis, no use of accessory musculature GI: No organomegaly, abdomen is soft and non-tender Skin: No lesions in the area of chief complaint Neurologic: Sensation intact distally Psychiatric: normal mood and affect Lymphatic: No axillary or cervical lymphadenopathy  MUSCULOSKELETAL:  Right lower extremity: Inspection: Mild swelling of the hip.  No ecchymosis.  No swelling of the knee or ecchymosis.  No obvious deformity.   Global tenderness to palpation of the right lateral hip.  Global tenderness to palpation of the right knee.   No tenderness to the ankle or foot.  Able to move ankle, and toes.  Sensation intact to the distal extremity.  DP pulse 2+.   intact extensor mech No calf swelling or tenderness  Distal ankle and foot strength normal Sensation is intact to light touch Extremity is warm and well perfused   Assessment: -Right hip displaced subcapital fracture  -Acute right  knee pain -sprain versus contusion of the right knee, x-rays are negative for fracture.  May be referred pain from the hip.  Plan: Admit to medicine  SCDs for DVT prophylaxis prior to surgery, hold Lovenox until after surgery.  Nonweightbearing to the right lower extremity  Plan for operative intervention with Dr. Lyla Glassing to perform a right Hemi hip arthroplasty likely around 3:30pm tomorrow afternoon.  Discussed the operation with the patient and the general risk and benefits  of the procedure.  N.p.o. at midnight tonight.   Faythe Casa, PA   09/03/2020 11:25 AM

## 2020-09-03 NOTE — Anesthesia Preprocedure Evaluation (Addendum)
Anesthesia Evaluation  Patient identified by MRN, date of birth, ID band Patient awake    Reviewed: Allergy & Precautions, NPO status , Patient's Chart, lab work & pertinent test results  Airway Mallampati: II  TM Distance: >3 FB Neck ROM: Full    Dental no notable dental hx. (+) Teeth Intact, Dental Advisory Given   Pulmonary neg pulmonary ROS,    Pulmonary exam normal breath sounds clear to auscultation       Cardiovascular Exercise Tolerance: Good + Peripheral Vascular Disease  Normal cardiovascular exam Rhythm:Regular Rate:Normal     Neuro/Psych  Headaches, Dementia    GI/Hepatic negative GI ROS, Neg liver ROS,   Endo/Other    Renal/GU Lab Results      Component                Value               Date                      CREATININE               0.50                09/03/2020                BUN                      16                  09/03/2020                NA                       136                 09/03/2020                K                        3.7                 09/03/2020                CL                       100                 09/03/2020                CO2                      24                  08/19/2020                Musculoskeletal negative musculoskeletal ROS (+)   Abdominal   Peds  Hematology Lab Results      Component                Value               Date                      WBC  12.4 (H)            09/03/2020                HGB                      13.9                09/03/2020                HCT                      41.0                09/03/2020                MCV                      94.7                09/03/2020                PLT                      165                 09/03/2020              Anesthesia Other Findings All: Keflex, Alendronate, risedronate  Reproductive/Obstetrics                            Anesthesia  Physical Anesthesia Plan  ASA: 4  Anesthesia Plan: Spinal   Post-op Pain Management:    Induction:   PONV Risk Score and Plan: 3 and Treatment may vary due to age or medical condition  Airway Management Planned: Natural Airway  Additional Equipment: None  Intra-op Plan:   Post-operative Plan:   Informed Consent: I have reviewed the patients History and Physical, chart, labs and discussed the procedure including the risks, benefits and alternatives for the proposed anesthesia with the patient or authorized representative who has indicated his/her understanding and acceptance.     Dental advisory given  Plan Discussed with:   Anesthesia Plan Comments:        Anesthesia Quick Evaluation

## 2020-09-03 NOTE — H&P (View-Only) (Signed)
ORTHOPAEDIC CONSULTATION  REQUESTING PHYSICIAN: Rise Patience, MD  PCP:  Cassandria Anger, MD  Chief Complaint: right hip pain   HPI: Tanya Walker is a 80 y.o. female who complains of right hip pain after a fall that is estimated to be around midnight of 09/03/2020.  Patient lives in a memory care facility.  She does have a past medical history of Alzheimer's, malnutrition, osteoporosis.  Patient reports pain of her right hip and right knee especially with any palpation or movement.  Reports some mild pain when resting completely still.  Denies fever or chills.  Denies numbness or tingling.  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   Depression    years ago   Headache    sinus headaches   Lipoma of arm    Right   Medical history non-contributory    Osteoporosis    SVD (spontaneous vaginal delivery)    x 2   Varicose vein    Vitamin D deficiency    Past Surgical History:  Procedure Laterality Date   [place]     x2 left and right arm   HYSTEROSCOPY WITH D & C N/A 07/15/2013   Procedure: DILATATION AND CURETTAGE /HYSTEROSCOPY;  Surgeon: Lovenia Kim, MD;  Location: [facility];  Service: Gynecology;  Laterality: N/A;   KYPHOPLASTY Bilateral 05/04/2020   Procedure: KYPHOPLASTY THORACIC TWELVE;  Surgeon: Vallarie Mare, MD;  Location: [facility];  Service: Neurosurgery;  Laterality: Bilateral;   MANDIBLE SURGERY  1954   WISDOM TOOTH EXTRACTION     Social History   Socioeconomic History   Marital status: Married    Spouse name: Not on file   Number of children: 1   Years of education: Not on file   Highest education level: Not on file  Occupational History   Occupation: retired  Tobacco Use   Smoking status: Never   Smokeless tobacco: Never  Vaping Use   Vaping Use: Never used  Substance and Sexual Activity   Alcohol  use: No   Drug use: No   Sexual activity: Yes    Birth control/protection: Post-menopausal  Other Topics Concern   Not on file  Social History Narrative   GI in [place] for colonoscopy   Dr Ronita Hipps - GYN      Denies Surgical history      Family history of varicose veins   Mother is 61      Regular exercise - NO   Social Determinants of Health   Financial Resource Strain: Low Risk    Difficulty of Paying Living Expenses: Not hard at all  Food Insecurity: No Food Insecurity   Worried About Charity fundraiser in the Last Year: Never true   [place] in the Last Year: Never true  Transportation Needs: No Transportation Needs   Lack of Transportation (Medical): No   Lack of Transportation (Non-Medical): No  Physical Activity: Sufficiently Active   Days of Exercise per Week: 5 days   Minutes of Exercise per Session: 30 min  Stress: Stress Concern Present   Feeling of Stress : Rather much  Social Connections: Moderately Integrated   Frequency of Communication with Friends and Family: More than three times a week   Frequency of Social Gatherings with  Friends and Family: Never   Attends Religious Services: Never   Marine scientist or Organizations: Yes   Attends Music therapist: Never   Marital Status: Married   Family History  Problem Relation Age of Onset   Hypertension Mother    Heart disease Father    Cancer Father    Allergies  Allergen Reactions   Keflex [Cephalexin] Other (See Comments)    "Allergic," per MAR   Actonel [Risedronate] Other (See Comments)    "Allergic," per MAR   Alendronate Sodium Other (See Comments)    Leg cramps and "Allergic," per MAR   Risedronate Sodium Other (See Comments)    Caused the patient to be achy and is "Allergic," per MAR   Tramadol Other (See Comments)    Caused the patient to feel badly    Prior to Admission medications   Medication Sig Start Date End Date Taking? Authorizing Provider  acetaminophen  (TYLENOL) 500 MG tablet Take 1,000 mg by mouth every 6 (six) hours as needed for moderate pain, mild pain, headache or fever.    [provider]  buPROPion (WELLBUTRIN) 75 MG tablet Take 75 mg by mouth 2 (two) times daily.    [provider]  cetirizine (ZYRTEC ALLERGY) 10 MG tablet Take 1 tablet (10 mg total) by mouth daily. 10/28/19 10/27/20  Plotnikov, [name], MD  Cholecalciferol (VITAMIN D3) 2000 units capsule Take 1 capsule (2,000 Units total) by mouth daily. 12/05/16   Plotnikov, [name], MD  Cobalamin Combinations (VITAMIN B12-FOLIC ACID PO) Place 1 lozenge under the tongue in the morning.    [provider]  docusate sodium (COLACE) 100 MG capsule Take 100 mg by mouth at bedtime.    [provider]  escitalopram (LEXAPRO) 10 MG tablet Take 10 mg by mouth every evening. 07/28/20   [provider]  gabapentin (NEURONTIN) 100 MG capsule Take 1 capsule (100 mg total) by mouth 3 (three) times daily. 06/08/20   Plotnikov, [name], MD  Melatonin 10 MG TABS Take 10 mg by mouth at bedtime.    [provider]  Multiple Vitamins-Minerals (PRESERVISION AREDS 2 PO) Take 1 capsule by mouth 2 (two) times daily.    [provider]  SALONPAS 3.02-13-08 % PTCH Apply 1 patch topically See admin instructions. Apply 1 patch to the lower back and remove (at most) after 8 hours- once a day    [provider]   CT Head Wo Contrast  Result Date: 09/03/2020 CLINICAL DATA:  80 year old female status post unwitnessed fall, posterior head injury. EXAM: CT HEAD WITHOUT CONTRAST TECHNIQUE: Contiguous axial images were obtained from the base of the skull through the vertex without intravenous contrast. COMPARISON:  Head CT 08/18/2020. FINDINGS: Brain: Stable cerebral volume. No midline shift, ventriculomegaly, mass effect, evidence of mass lesion, intracranial hemorrhage or evidence of cortically based acute infarction. Patchy and confluent bilateral  white matter hypodensity is stable. Small perivascular space left posterior lentiform, normal variant. Vascular: Calcified atherosclerosis at the skull base. No suspicious intracranial vascular hyperdensity. Skull: Mild motion artifact today.  Stable, no fracture identified. Sinuses/Orbits: Visualized paranasal sinuses and mastoids are stable and well aerated. Other: No discrete scalp or orbits soft tissue injury identified. IMPRESSION: 1. No acute traumatic injury identified. 2. Stable non contrast CT appearance of chronic white matter disease. Electronically Signed   By: Genevie Ann M.D.   On: 09/03/2020 08:26   CT Hip Right Wo Contrast  Result Date: 09/03/2020 CLINICAL DATA:  Right hip pain since the patient suffered a fall last night. Initial encounter. EXAM: CT OF THE RIGHT HIP WITHOUT CONTRAST TECHNIQUE: Multidetector CT imaging of the right hip was performed according to the standard protocol. Multiplanar CT image reconstructions were also generated. COMPARISON:  Plain films right hip earlier today. FINDINGS: Bones/Joint/Cartilage The patient has an acute subcapital fracture of the right hip. The fracture is mildly impacted and there is some medial displacement the neck of the femur. No other acute abnormality is seen. No focal lesion. Small right hip joint effusion due to the fracture is noted. Ligaments Suboptimally assessed by CT. Muscles and Tendons Intact. Soft tissues Negative. There is some stranding in subcutaneous fat about the right hip likely due to contusion. IMPRESSION: Acute subcapital fracture right hip as described. Electronically Signed   By: Inge Rise M.D.   On: 09/03/2020 09:22   DG Knee Complete 4 Views Right  Result Date: 09/03/2020 CLINICAL DATA:  Pain status post fall. EXAM: RIGHT KNEE - COMPLETE 4+ VIEW COMPARISON:  None. FINDINGS: No evidence of fracture, dislocation, or joint effusion. No evidence of arthropathy or other focal bone abnormality. Soft tissues are  unremarkable. IMPRESSION: No acute fracture or dislocation of the right knee. Electronically Signed   By: Miachel Roux M.D.   On: 09/03/2020 07:53   DG Hip Unilat W or Wo Pelvis 2-3 Views Right  Result Date: 09/03/2020 CLINICAL DATA:  Diffuse right hip pain status post fall EXAM: DG HIP (WITH OR WITHOUT PELVIS) 2-3V RIGHT COMPARISON:  None. FINDINGS: There is subtle lucency in the right femoral neck suspicious for nondisplaced fracture. Evaluation is significantly limited due to patient positioning and overlying structures. IMPRESSION: Subtle lucency in the right femoral neck is suspicious for nondisplaced fracture. Current images are limited. Further evaluation with CT would be beneficial. Electronically Signed   By: Miachel Roux M.D.   On: 09/03/2020 07:52    Positive ROS: All other systems have been reviewed and were otherwise negative with the exception of those mentioned in the HPI and as above.  Physical Exam: General: Alert, no acute distress Cardiovascular: No pedal edema Respiratory: No cyanosis, no use of accessory musculature GI: No organomegaly, abdomen is soft and non-tender Skin: No lesions in the area of chief complaint Neurologic: Sensation intact distally Psychiatric: normal mood and affect Lymphatic: No axillary or cervical lymphadenopathy  MUSCULOSKELETAL:  Right lower extremity: Inspection: Mild swelling of the hip.  No ecchymosis.  No swelling of the knee or ecchymosis.  No obvious deformity.   Global tenderness to palpation of the right lateral hip.  Global tenderness to palpation of the right knee.   No tenderness to the ankle or foot.  Able to move ankle, and toes.  Sensation intact to the distal extremity.  DP pulse 2+.   intact extensor mech No calf swelling or tenderness  Distal ankle and foot strength normal Sensation is intact to light touch Extremity is warm and well perfused   Assessment: -Right hip displaced subcapital fracture  -Acute right  knee pain -sprain versus contusion of the right knee, x-rays are negative for fracture.  May be referred pain from the hip.  Plan: Admit to medicine  SCDs for DVT prophylaxis prior to surgery, hold Lovenox until after surgery.  Nonweightbearing to the right lower extremity  Plan for operative intervention with Dr. Lyla Glassing to perform a right Hemi hip arthroplasty likely around 3:30pm tomorrow afternoon.  Discussed the operation with the patient and the general risk and benefits  of the procedure.  N.p.o. at midnight tonight.   Faythe Casa, PA   09/03/2020 11:25 AM

## 2020-09-03 NOTE — ED Triage Notes (Signed)
Patient arrives via EMS from [facility] (memory care unit). Staff at facility went to check on patient at 0430 and pt reported she had a fall around [place]. Patient reports she got herself up and into bed and did not call out for staff. When saff went to check on patient she reported the fall. Pt reports hitting back of head, no obvious deformity, no blood thinners. 22 g LH  EMS vitals: BP 160/80 HR 94 SPO2 97% RR 18

## 2020-09-03 NOTE — H&P (Signed)
History and Physical    ENGA LAUDICINA [id number] DOB: Jan 09, 1941 DOA: 09/03/2020  PCP: Cassandria Anger, MD  Patient coming from: Skilled nursing facility.  Chief Complaint: Fall.  HPI: Tanya Walker is a 80 y.o. female with history of dementia per the chart, who was recently admitted to the hospital for metabolic encephalopathy was brought to the ER the patient had a fall at nursing facility and complained of right hip pain.  Patient remembers incident and states that she woke up around 4 AM and was changing when she felt slightly dizzy and fell and hit her head but did not lose consciousness.  After the fall she had right hip pain.  ED Course: In the ER CT scan of the right hip confirms right hip fracture.  Labs are largely unremarkable.  COVID test is negative EKG shows normal sinus rhythm.  On-call orthopedic surgeon for [facility] has been consulted plan is to have surgery tomorrow for which patient will be kept n.p.o. past midnight.  Review of Systems: As per HPI, rest all negative.   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   Depression    years ago   Headache    sinus headaches   Lipoma of arm    Right   Medical history non-contributory    Osteoporosis    SVD (spontaneous vaginal delivery)    x 2   Varicose vein    Vitamin D deficiency     Past Surgical History:  Procedure Laterality Date   [place]     x2 left and right arm   HYSTEROSCOPY WITH D & C N/A 07/15/2013   Procedure: DILATATION AND CURETTAGE /HYSTEROSCOPY;  Surgeon: Lovenia Kim, MD;  Location: [facility];  Service: Gynecology;  Laterality: N/A;   KYPHOPLASTY Bilateral 05/04/2020   Procedure: KYPHOPLASTY THORACIC TWELVE;  Surgeon: Vallarie Mare, MD;  Location: [facility];  Service: Neurosurgery;  Laterality: Bilateral;   MANDIBLE SURGERY  1954   WISDOM TOOTH  EXTRACTION       reports that she has never smoked. She has never used smokeless tobacco. She reports that she does not drink alcohol and does not use drugs.  Allergies  Allergen Reactions   Keflex [Cephalexin] Other (See Comments)    "Allergic," per MAR   Actonel [Risedronate] Other (See Comments)    "Allergic," per MAR   Alendronate Sodium Other (See Comments)    Leg cramps and "Allergic," per MAR   Risedronate Sodium Other (See Comments)    Caused the patient to be achy and is "Allergic," per MAR   Tramadol Other (See Comments)    Caused the patient to feel badly     Family History  Problem Relation Age of Onset   Hypertension Mother    Heart disease Father    Cancer Father     Prior to Admission medications   Medication Sig Start Date End Date Taking? Authorizing Provider  acetaminophen (TYLENOL) 500 MG tablet Take 1,000 mg by mouth every 6 (six) hours as needed for moderate pain, mild pain, headache or fever.    [provider]  buPROPion (WELLBUTRIN) 75 MG tablet Take 75 mg by mouth 2 (two) times daily.    [provider]  cetirizine (ZYRTEC ALLERGY) 10 MG tablet Take  1 tablet (10 mg total) by mouth daily. 10/28/19 10/27/20  Plotnikov, [name], MD  Cholecalciferol (VITAMIN D3) 2000 units capsule Take 1 capsule (2,000 Units total) by mouth daily. 12/05/16   Plotnikov, [name], MD  Cobalamin Combinations (VITAMIN B12-FOLIC ACID PO) Place 1 lozenge under the tongue in the morning.    [provider]  docusate sodium (COLACE) 100 MG capsule Take 100 mg by mouth at bedtime.    [provider]  escitalopram (LEXAPRO) 10 MG tablet Take 10 mg by mouth every evening. 07/28/20   [provider]  gabapentin (NEURONTIN) 100 MG capsule Take 1 capsule (100 mg total) by mouth 3 (three) times daily. 06/08/20   Plotnikov, [name], MD  Melatonin 10 MG TABS Take 10 mg by mouth at bedtime.    [provider]  Multiple Vitamins-Minerals  (PRESERVISION AREDS 2 PO) Take 1 capsule by mouth 2 (two) times daily.    [provider]  SALONPAS 3.02-13-08 % PTCH Apply 1 patch topically See admin instructions. Apply 1 patch to the lower back and remove (at most) after 8 hours- once a day    [provider]    Physical Exam: Constitutional: Moderately built and nourished. Vitals:   09/03/20 0619 09/03/20 0800 09/03/20 0900 09/03/20 1000  BP: (!) 148/61 (!) 124/55 98/68 (!) 146/75  Pulse: 76 76 79 84  Resp: '15 17 13 19  '$ Temp: 98 F (36.7 C)     TempSrc: Oral     SpO2: 98% 93% 96% 99%  Weight: 40.8 kg     Height: '5\' 4"'$  (1.626 m)      Eyes: Anicteric no pallor. ENMT: No discharge from the ears eyes nose or mouth. Neck: No mass felt.  No neck rigidity. Respiratory: No rhonchi or crepitations. Cardiovascular: S1-S2 heard. Abdomen: Soft nontender bowel sounds present. Musculoskeletal: Pain on moving right hip. Skin: No rash. Neurologic: Alert awake oriented to name and place moving all extremities. Psychiatric: Oriented to name and place.   Labs on Admission: I have personally reviewed following labs and imaging studies  CBC: Recent Labs  Lab 09/03/20 0700 09/03/20 0716  WBC 12.4*  --   NEUTROABS 11.0*  --   HGB 13.4 13.9  HCT 41.5 41.0  MCV 94.7  --   PLT 165  --    Basic Metabolic Panel: Recent Labs  Lab 09/03/20 0716  NA 136  K 3.7  CL 100  GLUCOSE 86  BUN 16  CREATININE 0.50   GFR: Estimated Creatinine Clearance: 35.5 mL/min (by C-G formula based on SCr of 0.5 mg/dL). Liver Function Tests: No results for input(s): AST, ALT, ALKPHOS, BILITOT, PROT, ALBUMIN in the last 168 hours. No results for input(s): LIPASE, AMYLASE in the last 168 hours. No results for input(s): AMMONIA in the last 168 hours. Coagulation Profile: No results for input(s): INR, PROTIME in the last 168 hours. Cardiac Enzymes: No results for input(s): CKTOTAL, CKMB, CKMBINDEX, TROPONINI in the last 168 hours. BNP  (last 3 results) No results for input(s): PROBNP in the last 8760 hours. HbA1C: No results for input(s): HGBA1C in the last 72 hours. CBG: No results for input(s): GLUCAP in the last 168 hours. Lipid Profile: No results for input(s): CHOL, HDL, LDLCALC, TRIG, CHOLHDL, LDLDIRECT in the last 72 hours. Thyroid Function Tests: No results for input(s): TSH, T4TOTAL, FREET4, T3FREE, THYROIDAB in the last 72 hours. Anemia Panel: No results for input(s): VITAMINB12, FOLATE, FERRITIN, TIBC, IRON, RETICCTPCT in the last 72 hours.  Urine analysis:    Component Value Date/Time   COLORURINE YELLOW 09/03/2020 0831   APPEARANCEUR CLEAR 09/03/2020 0831   LABSPEC 1.020 09/03/2020 0831   PHURINE 6.0 09/03/2020 0831   GLUCOSEU NEGATIVE 09/03/2020 0831   GLUCOSEU NEGATIVE 12/05/2016 1056   HGBUR NEGATIVE 09/03/2020 0831   BILIRUBINUR NEGATIVE 09/03/2020 0831   KETONESUR 80 (A) 09/03/2020 0831   PROTEINUR NEGATIVE 09/03/2020 0831   UROBILINOGEN 0.2 12/05/2016 1056   NITRITE NEGATIVE 09/03/2020 0831   LEUKOCYTESUR NEGATIVE 09/03/2020 0831   Sepsis Labs: '@LABRCNTIP'$ (procalcitonin:4,lacticidven:4) )No results found for this or any previous visit (from the past 240 hour(s)).   Radiological Exams on Admission: CT Head Wo Contrast  Result Date: 09/03/2020 CLINICAL DATA:  80 year old female status post unwitnessed fall, posterior head injury. EXAM: CT HEAD WITHOUT CONTRAST TECHNIQUE: Contiguous axial images were obtained from the base of the skull through the vertex without intravenous contrast. COMPARISON:  Head CT 08/18/2020. FINDINGS: Brain: Stable cerebral volume. No midline shift, ventriculomegaly, mass effect, evidence of mass lesion, intracranial hemorrhage or evidence of cortically based acute infarction. Patchy and confluent bilateral white matter hypodensity is stable. Small perivascular space left posterior lentiform, normal variant. Vascular: Calcified atherosclerosis at the skull base. No  suspicious intracranial vascular hyperdensity. Skull: Mild motion artifact today.  Stable, no fracture identified. Sinuses/Orbits: Visualized paranasal sinuses and mastoids are stable and well aerated. Other: No discrete scalp or orbits soft tissue injury identified. IMPRESSION: 1. No acute traumatic injury identified. 2. Stable non contrast CT appearance of chronic white matter disease. Electronically Signed   By: Genevie Ann M.D.   On: 09/03/2020 08:26   CT Hip Right Wo Contrast  Result Date: 09/03/2020 CLINICAL DATA:  Right hip pain since the patient suffered a fall last night. Initial encounter. EXAM: CT OF THE RIGHT HIP WITHOUT CONTRAST TECHNIQUE: Multidetector CT imaging of the right hip was performed according to the standard protocol. Multiplanar CT image reconstructions were also generated. COMPARISON:  Plain films right hip earlier today. FINDINGS: Bones/Joint/Cartilage The patient has an acute subcapital fracture of the right hip. The fracture is mildly impacted and there is some medial displacement the neck of the femur. No other acute abnormality is seen. No focal lesion. Small right hip joint effusion due to the fracture is noted. Ligaments Suboptimally assessed by CT. Muscles and Tendons Intact. Soft tissues Negative. There is some stranding in subcutaneous fat about the right hip likely due to contusion. IMPRESSION: Acute subcapital fracture right hip as described. Electronically Signed   By: Inge Rise M.D.   On: 09/03/2020 09:22   DG Knee Complete 4 Views Right  Result Date: 09/03/2020 CLINICAL DATA:  Pain status post fall. EXAM: RIGHT KNEE - COMPLETE 4+ VIEW COMPARISON:  None. FINDINGS: No evidence of fracture, dislocation, or joint effusion. No evidence of arthropathy or other focal bone abnormality. Soft tissues are unremarkable. IMPRESSION: No acute fracture or dislocation of the right knee. Electronically Signed   By: Miachel Roux M.D.   On: 09/03/2020 07:53   DG Hip Unilat W or Wo  Pelvis 2-3 Views Right  Result Date: 09/03/2020 CLINICAL DATA:  Diffuse right hip pain status post fall EXAM: DG HIP (WITH OR WITHOUT PELVIS) 2-3V RIGHT COMPARISON:  None. FINDINGS: There is subtle lucency in the right femoral neck suspicious for nondisplaced fracture. Evaluation is significantly limited due to patient positioning and overlying structures. IMPRESSION: Subtle lucency in the right femoral neck is suspicious for nondisplaced fracture. Current images are limited. Further evaluation with CT  would be beneficial. Electronically Signed   By: Miachel Roux M.D.   On: 09/03/2020 07:52    EKG: Independently reviewed.  Normal sinus rhythm.  Assessment/Plan Principal Problem:   Closed right hip fracture, initial encounter [facility]) Active Problems:   Alzheimer disease ([place])    Right hip fracture status post mechanical fall -on-call orthopedic surgery has been consulted.  We will keep patient n.p.o. past midnight anticipation of surgery tomorrow.  Pain relief medications.  Physical therapy consult. History of dementia and depression continue home medications.  Since patient has hip fracture patient will be needing surgery and more than 2 midnight stay in inpatient status.   DVT prophylaxis: SCDs.  Avoiding anticoagulation in anticipation of surgery. Code Status: Full code. Family Communication: Will need to discuss with family. Disposition Plan: Back to facility when stable.  Will need rehab. Consults called: Orthopedics. Admission status: Inpatient.   Rise Patience MD [facility] Pager [phone].  If 7PM-7AM, please contact night-coverage [email or web address] Password [facility]  09/03/2020, 11:15 AM

## 2020-09-04 ENCOUNTER — Encounter (HOSPITAL_COMMUNITY)
Admission: EM | Disposition: A | Discharge status: Skilled Nursing Facility | Payer: Self-pay | Source: Skilled Nursing Facility | Attending: Internal Medicine

## 2020-09-04 ENCOUNTER — Inpatient Hospital Stay (HOSPITAL_COMMUNITY): Payer: Medicare Other | Admitting: Anesthesiology

## 2020-09-04 ENCOUNTER — Inpatient Hospital Stay (HOSPITAL_COMMUNITY): Payer: Medicare Other

## 2020-09-04 DIAGNOSIS — F0280 Dementia in other diseases classified elsewhere without behavioral disturbance: Secondary | ICD-10-CM | POA: Diagnosis not present

## 2020-09-04 DIAGNOSIS — S72001A Fracture of unspecified part of neck of right femur, initial encounter for closed fracture: Secondary | ICD-10-CM | POA: Diagnosis not present

## 2020-09-04 DIAGNOSIS — G309 Alzheimer's disease, unspecified: Secondary | ICD-10-CM

## 2020-09-04 HISTORY — PX: ANTERIOR APPROACH HEMI HIP ARTHROPLASTY: SHX6690

## 2020-09-04 LAB — CBC
HCT: 39.5 % (ref 36.0–46.0)
Hemoglobin: 12.8 g/dL (ref 12.0–15.0)
MCH: 30.8 pg (ref 26.0–34.0)
MCHC: 32.4 g/dL (ref 30.0–36.0)
MCV: 95.2 fL (ref 80.0–100.0)
Platelets: 144 10*3/uL — ABNORMAL LOW (ref 150–400)
RBC: 4.15 MIL/uL (ref 3.87–5.11)
RDW: 13.2 % (ref 11.5–15.5)
WBC: 10.3 10*3/uL (ref 4.0–10.5)
nRBC: 0 % (ref 0.0–0.2)

## 2020-09-04 LAB — CREATININE, SERUM
Creatinine, Ser: 0.56 mg/dL (ref 0.44–1.00)
GFR, Estimated: >60 mL/min (ref >60)

## 2020-09-04 LAB — ABO/RH: ABO/RH(D): O POS

## 2020-09-04 LAB — SURGICAL PCR SCREEN
MRSA, PCR: NEGATIVE
Staphylococcus aureus: POSITIVE — AB

## 2020-09-04 LAB — TYPE AND SCREEN
ABO/RH(D): O POS
Antibody Screen: NEGATIVE

## 2020-09-04 SURGERY — HEMIARTHROPLASTY, HIP, DIRECT ANTERIOR APPROACH, FOR FRACTURE
Anesthesia: Spinal | Laterality: Right

## 2020-09-04 MED ORDER — METOCLOPRAMIDE HCL 5 MG PO TABS: 5.0000 mg | ORAL_TABLET | Freq: Three times a day (TID) | ORAL | Status: DC | PRN

## 2020-09-04 MED ORDER — TRANEXAMIC ACID-NACL 1000-0.7 MG/100ML-% IV SOLN
1000.0000 mg | Freq: Once | INTRAVENOUS | Status: AC
  Administered 2020-09-04: 1000 mg via INTRAVENOUS
  Filled 2020-09-04: qty 100

## 2020-09-04 MED ORDER — MENTHOL 3 MG MT LOZG: 1.0000 | LOZENGE | OROMUCOSAL | Status: DC | PRN

## 2020-09-04 MED ORDER — PHENYLEPHRINE 40 MCG/ML (10ML) SYRINGE FOR IV PUSH (FOR BLOOD PRESSURE SUPPORT)
PREFILLED_SYRINGE | INTRAVENOUS | Status: DC | PRN
  Administered 2020-09-04 (×4): 80 ug via INTRAVENOUS

## 2020-09-04 MED ORDER — LIDOCAINE 2% (20 MG/ML) 5 ML SYRINGE
INTRAMUSCULAR | Status: AC
  Filled 2020-09-04: qty 5

## 2020-09-04 MED ORDER — LACTATED RINGERS IV SOLN: INTRAVENOUS | Status: DC

## 2020-09-04 MED ORDER — CHLORHEXIDINE GLUCONATE 4 % EX LIQD: 60.0000 mL | Freq: Once | CUTANEOUS | Status: DC

## 2020-09-04 MED ORDER — ALBUMIN HUMAN 5 % IV SOLN
INTRAVENOUS | Status: AC
  Filled 2020-09-04: qty 250

## 2020-09-04 MED ORDER — SODIUM CHLORIDE (PF) 0.9 % IJ SOLN
INTRAMUSCULAR | Status: AC
  Filled 2020-09-04: qty 30

## 2020-09-04 MED ORDER — SODIUM CHLORIDE 0.9 % IV SOLN
2.0000 g | INTRAVENOUS | Status: AC
  Administered 2020-09-04: 2 g via INTRAVENOUS

## 2020-09-04 MED ORDER — ORAL CARE MOUTH RINSE: 15.0000 mL | Freq: Once | OROMUCOSAL | Status: AC

## 2020-09-04 MED ORDER — DEXAMETHASONE SODIUM PHOSPHATE 10 MG/ML IJ SOLN
INTRAMUSCULAR | Status: DC | PRN
  Administered 2020-09-04: 10 mg via INTRAVENOUS

## 2020-09-04 MED ORDER — TRANEXAMIC ACID-NACL 1000-0.7 MG/100ML-% IV SOLN
1000.0000 mg | INTRAVENOUS | Status: AC
  Administered 2020-09-04: 1000 mg via INTRAVENOUS

## 2020-09-04 MED ORDER — ROCURONIUM BROMIDE 10 MG/ML (PF) SYRINGE
PREFILLED_SYRINGE | INTRAVENOUS | Status: AC
  Filled 2020-09-04: qty 10

## 2020-09-04 MED ORDER — TRANEXAMIC ACID-NACL 1000-0.7 MG/100ML-% IV SOLN
INTRAVENOUS | Status: AC
  Filled 2020-09-04: qty 100

## 2020-09-04 MED ORDER — PROPOFOL 500 MG/50ML IV EMUL
INTRAVENOUS | Status: DC | PRN
  Administered 2020-09-04: 20 ug/kg/min via INTRAVENOUS

## 2020-09-04 MED ORDER — ONDANSETRON HCL 4 MG PO TABS: 4.0000 mg | ORAL_TABLET | Freq: Four times a day (QID) | ORAL | Status: DC | PRN

## 2020-09-04 MED ORDER — CHLORHEXIDINE GLUCONATE 0.12 % MT SOLN
15.0000 mL | Freq: Once | OROMUCOSAL | Status: AC
  Administered 2020-09-04: 15 mL via OROMUCOSAL

## 2020-09-04 MED ORDER — CEFAZOLIN SODIUM 2 G IJ SOLR
2.0000 g | Freq: Four times a day (QID) | INTRAMUSCULAR | Status: DC
  Filled 2020-09-04: qty 2

## 2020-09-04 MED ORDER — SODIUM CHLORIDE (PF) 0.9 % IJ SOLN
INTRAMUSCULAR | Status: DC | PRN
  Administered 2020-09-04: 30 mL

## 2020-09-04 MED ORDER — BUPIVACAINE HCL (PF) 0.5 % IJ SOLN
INTRAMUSCULAR | Status: DC | PRN
  Administered 2020-09-04: 9 mg

## 2020-09-04 MED ORDER — CHLORHEXIDINE GLUCONATE CLOTH 2 % EX PADS
6.0000 | MEDICATED_PAD | Freq: Every day | CUTANEOUS | Status: DC
  Administered 2020-09-05 – 2020-09-07 (×3): 6 via TOPICAL

## 2020-09-04 MED ORDER — ONDANSETRON HCL 4 MG/2ML IJ SOLN: 4.0000 mg | Freq: Four times a day (QID) | INTRAMUSCULAR | Status: DC | PRN

## 2020-09-04 MED ORDER — POVIDONE-IODINE 10 % EX SWAB
2.0000 "application " | Freq: Once | CUTANEOUS | Status: AC
  Administered 2020-09-04: 2 via TOPICAL

## 2020-09-04 MED ORDER — POVIDONE-IODINE 10 % EX SWAB: 2.0000 "application " | Freq: Once | CUTANEOUS | Status: DC

## 2020-09-04 MED ORDER — HYDROCODONE-ACETAMINOPHEN 5-325 MG PO TABS
1.0000 | ORAL_TABLET | ORAL | Status: DC | PRN
  Administered 2020-09-05: 2 via ORAL
  Administered 2020-09-05 – 2020-09-07 (×2): 1 via ORAL
  Filled 2020-09-04 (×2): qty 1
  Filled 2020-09-04: qty 2

## 2020-09-04 MED ORDER — HYDROCODONE-ACETAMINOPHEN 7.5-325 MG PO TABS: 1.0000 | ORAL_TABLET | ORAL | Status: DC | PRN

## 2020-09-04 MED ORDER — ALBUMIN HUMAN 5 % IV SOLN: INTRAVENOUS | Status: DC | PRN

## 2020-09-04 MED ORDER — SODIUM CHLORIDE 0.9 % IV SOLN
INTRAVENOUS | Status: AC
  Filled 2020-09-04: qty 2

## 2020-09-04 MED ORDER — DEXAMETHASONE SODIUM PHOSPHATE 10 MG/ML IJ SOLN
INTRAMUSCULAR | Status: AC
  Filled 2020-09-04: qty 1

## 2020-09-04 MED ORDER — KETOROLAC TROMETHAMINE 30 MG/ML IJ SOLN
INTRAMUSCULAR | Status: AC
  Filled 2020-09-04: qty 1

## 2020-09-04 MED ORDER — METOCLOPRAMIDE HCL 5 MG/ML IJ SOLN: 5.0000 mg | Freq: Three times a day (TID) | INTRAMUSCULAR | Status: DC | PRN

## 2020-09-04 MED ORDER — ENOXAPARIN SODIUM 30 MG/0.3ML IJ SOSY
30.0000 mg | PREFILLED_SYRINGE | INTRAMUSCULAR | Status: DC
  Administered 2020-09-05 – 2020-09-07 (×3): 30 mg via SUBCUTANEOUS
  Filled 2020-09-04 (×3): qty 0.3

## 2020-09-04 MED ORDER — HYDRALAZINE HCL 20 MG/ML IJ SOLN: 10.0000 mg | Freq: Four times a day (QID) | INTRAMUSCULAR | Status: DC | PRN

## 2020-09-04 MED ORDER — BOOST PLUS PO LIQD
237.0000 mL | Freq: Three times a day (TID) | ORAL | Status: DC
  Administered 2020-09-05 – 2020-09-07 (×6): 237 mL via ORAL
  Filled 2020-09-04 (×10): qty 237

## 2020-09-04 MED ORDER — FENTANYL CITRATE (PF) 100 MCG/2ML IJ SOLN: 25.0000 ug | INTRAMUSCULAR | Status: DC | PRN

## 2020-09-04 MED ORDER — ACETAMINOPHEN 325 MG PO TABS
325.0000 mg | ORAL_TABLET | Freq: Four times a day (QID) | ORAL | Status: DC | PRN
  Administered 2020-09-05 – 2020-09-07 (×5): 650 mg via ORAL
  Filled 2020-09-04 (×5): qty 2

## 2020-09-04 MED ORDER — SODIUM CHLORIDE 0.9 % IR SOLN
Status: DC | PRN
  Administered 2020-09-04: 3000 mL

## 2020-09-04 MED ORDER — BUPIVACAINE-EPINEPHRINE (PF) 0.5% -1:200000 IJ SOLN
INTRAMUSCULAR | Status: AC
  Filled 2020-09-04: qty 30

## 2020-09-04 MED ORDER — FENTANYL CITRATE (PF) 100 MCG/2ML IJ SOLN
INTRAMUSCULAR | Status: AC
  Filled 2020-09-04: qty 2

## 2020-09-04 MED ORDER — PHENOL 1.4 % MT LIQD: 1.0000 | OROMUCOSAL | Status: DC | PRN

## 2020-09-04 MED ORDER — ACETAMINOPHEN 10 MG/ML IV SOLN: 1000.0000 mg | Freq: Once | INTRAVENOUS | Status: DC | PRN

## 2020-09-04 MED ORDER — CEFAZOLIN SODIUM 2 G IJ SOLR
2.0000 g | Freq: Four times a day (QID) | INTRAMUSCULAR | Status: AC
Start: 2020-09-04 — End: 2020-09-05
  Administered 2020-09-04 – 2020-09-05 (×2): 2 g via INTRAVENOUS
  Filled 2020-09-04 (×2): qty 2

## 2020-09-04 MED ORDER — ADULT MULTIVITAMIN W/MINERALS CH
1.0000 | ORAL_TABLET | Freq: Every day | ORAL | Status: DC
  Administered 2020-09-05 – 2020-09-07 (×3): 1 via ORAL
  Filled 2020-09-04 (×3): qty 1

## 2020-09-04 MED ORDER — FENTANYL CITRATE (PF) 100 MCG/2ML IJ SOLN
INTRAMUSCULAR | Status: DC | PRN
  Administered 2020-09-04 (×2): 25 ug via INTRAVENOUS

## 2020-09-04 MED ORDER — ONDANSETRON HCL 4 MG/2ML IJ SOLN
INTRAMUSCULAR | Status: DC | PRN
  Administered 2020-09-04: 4 mg via INTRAVENOUS

## 2020-09-04 MED ORDER — ONDANSETRON HCL 4 MG/2ML IJ SOLN
INTRAMUSCULAR | Status: AC
  Filled 2020-09-04: qty 2

## 2020-09-04 MED ORDER — BUPIVACAINE-EPINEPHRINE (PF) 0.5% -1:200000 IJ SOLN
INTRAMUSCULAR | Status: DC | PRN
  Administered 2020-09-04: 30 mL via PERINEURAL

## 2020-09-04 MED ORDER — PHENYLEPHRINE 40 MCG/ML (10ML) SYRINGE FOR IV PUSH (FOR BLOOD PRESSURE SUPPORT)
PREFILLED_SYRINGE | INTRAVENOUS | Status: AC
  Filled 2020-09-04: qty 20

## 2020-09-04 MED ORDER — PROPOFOL 10 MG/ML IV BOLUS
INTRAVENOUS | Status: AC
  Filled 2020-09-04: qty 20

## 2020-09-04 MED ORDER — ACETAMINOPHEN 325 MG PO TABS: 650.0000 mg | ORAL_TABLET | Freq: Four times a day (QID) | ORAL | Status: DC | PRN

## 2020-09-04 MED ORDER — DOCUSATE SODIUM 100 MG PO CAPS
100.0000 mg | ORAL_CAPSULE | Freq: Two times a day (BID) | ORAL | Status: DC
  Administered 2020-09-04 – 2020-09-07 (×7): 100 mg via ORAL
  Filled 2020-09-04 (×7): qty 1

## 2020-09-04 MED ORDER — OXYCODONE-ACETAMINOPHEN 5-325 MG PO TABS
1.0000 | ORAL_TABLET | Freq: Four times a day (QID) | ORAL | Status: DC | PRN
Start: 2020-09-04 — End: 2020-09-08

## 2020-09-04 MED ORDER — MORPHINE SULFATE (PF) 4 MG/ML IV SOLN: 0.5000 mg | INTRAVENOUS | Status: DC | PRN

## 2020-09-04 MED ORDER — SODIUM CHLORIDE 0.9 % IV SOLN
INTRAVENOUS | Status: DC | PRN
  Administered 2020-09-04: 1000 mL

## 2020-09-04 MED ORDER — KETOROLAC TROMETHAMINE 30 MG/ML IJ SOLN
INTRAMUSCULAR | Status: DC | PRN
  Administered 2020-09-04: 30 mg

## 2020-09-04 SURGICAL SUPPLY — 57 items
ADH SKN CLS APL DERMABOND .7 (GAUZE/BANDAGES/DRESSINGS) ×1
APL PRP STRL LF DISP 70% ISPRP (MISCELLANEOUS) ×1
BAG COUNTER SPONGE SURGICOUNT (BAG) ×2 IMPLANT
BAG SPNG CNTER NS LX DISP (BAG) ×1
BLADE CLIPPER SURG (BLADE) IMPLANT
CHLORAPREP W/TINT 26 (MISCELLANEOUS) ×2 IMPLANT
COVER SURGICAL LIGHT HANDLE (MISCELLANEOUS) ×2 IMPLANT
DERMABOND ADVANCED (GAUZE/BANDAGES/DRESSINGS) ×1
DERMABOND ADVANCED .7 DNX12 (GAUZE/BANDAGES/DRESSINGS) ×2 IMPLANT
DRAPE IMP U-DRAPE 54X76 (DRAPES) ×2 IMPLANT
DRAPE SHEET LG 3/4 BI-LAMINATE (DRAPES) ×4 IMPLANT
DRAPE STERI IOBAN 125X83 (DRAPES) ×2 IMPLANT
DRAPE U-SHAPE 47X51 STRL (DRAPES) ×4 IMPLANT
DRSG AQUACEL AG ADV 3.5X10 (GAUZE/BANDAGES/DRESSINGS) ×2 IMPLANT
ELECT REM PT RETURN 15FT ADLT (MISCELLANEOUS) ×2 IMPLANT
EVACUATOR 1/8 PVC DRAIN (DRAIN) IMPLANT
GLOVE SRG 8 PF TXTR STRL LF DI (GLOVE) ×1 IMPLANT
GLOVE SURG ENC MOIS LTX SZ8.5 (GLOVE) ×4 IMPLANT
GLOVE SURG ENC TEXT LTX SZ7.5 (GLOVE) ×4 IMPLANT
GLOVE SURG UNDER POLY LF SZ8 (GLOVE) ×2
GLOVE SURG UNDER POLY LF SZ8.5 (GLOVE) ×2 IMPLANT
GOWN STRL REUS W/ TWL LRG LVL3 (GOWN DISPOSABLE) ×1 IMPLANT
GOWN STRL REUS W/TWL 2XL LVL3 (GOWN DISPOSABLE) ×2 IMPLANT
GOWN STRL REUS W/TWL LRG LVL3 (GOWN DISPOSABLE) ×2
HANDPIECE INTERPULSE COAX TIP (DISPOSABLE) ×2
HEAD FEM UNIPOLAR 44 OD STRL (Hips) ×1 IMPLANT
HOOD PEEL AWAY FLYTE STAYCOOL (MISCELLANEOUS) ×6 IMPLANT
JET LAVAGE IRRISEPT WOUND (IRRIGATION / IRRIGATOR)
KIT TURNOVER KIT A (KITS) ×2 IMPLANT
LAVAGE JET IRRISEPT WOUND (IRRIGATION / IRRIGATOR) IMPLANT
MANIFOLD NEPTUNE II (INSTRUMENTS) ×2 IMPLANT
MARKER SKIN DUAL TIP RULER LAB (MISCELLANEOUS) ×2 IMPLANT
NDL SPNL 18GX3.5 QUINCKE PK (NEEDLE) ×1 IMPLANT
NEEDLE SPNL 18GX3.5 QUINCKE PK (NEEDLE) ×2 IMPLANT
NS IRRIG 1000ML POUR BTL (IV SOLUTION) ×2 IMPLANT
PACK ANTERIOR HIP CUSTOM (KITS) ×2 IMPLANT
PENCIL SMOKE EVACUATOR (MISCELLANEOUS) IMPLANT
SAW OSC TIP CART 19.5X105X1.3 (SAW) ×2 IMPLANT
SEALER BIPOLAR AQUA 6.0 (INSTRUMENTS) ×2 IMPLANT
SET HNDPC FAN SPRY TIP SCT (DISPOSABLE) ×1 IMPLANT
SLEEVE CABLE 2MM VT (Orthopedic Implant) ×1 IMPLANT
SPACER DEPUY (Hips) ×1 IMPLANT
STEM TRI LOC GRIPTION SZ 7 STD IMPLANT
SUT ETHIBOND NAB CT1 #1 30IN (SUTURE) ×4 IMPLANT
SUT MNCRL AB 3-0 PS2 18 (SUTURE) ×2 IMPLANT
SUT MON AB 2-0 CT1 36 (SUTURE) ×2 IMPLANT
SUT STRATAFIX PDO 1 14 VIOLET (SUTURE) ×2
SUT STRATFX PDO 1 14 VIOLET (SUTURE) ×1
SUT VIC AB 1 CT1 27 (SUTURE) ×2
SUT VIC AB 1 CT1 27XBRD ANTBC (SUTURE) ×1 IMPLANT
SUT VIC AB 2-0 CT1 27 (SUTURE) ×2
SUT VIC AB 2-0 CT1 TAPERPNT 27 (SUTURE) ×1 IMPLANT
SUTURE STRATFX PDO 1 14 VIOLET (SUTURE) ×1 IMPLANT
TRAY FOLEY MTR SLVR 16FR STAT (SET/KITS/TRAYS/PACK) IMPLANT
TRI LOC GRIPTION SZ 7 STD ×2 IMPLANT
TUBE SUCTION HIGH CAP CLEAR NV (SUCTIONS) ×2 IMPLANT
WATER STERILE IRR 1000ML POUR (IV SOLUTION) ×4 IMPLANT

## 2020-09-04 NOTE — TOC Initial Note (Signed)
Transition of Care [facility]) - Initial/Assessment Note    Patient Details  Name: Tanya Walker MRN: [id number] Date of Birth: 1940/11/08  Transition of Care [facility]) CM/SW Contact:    Dessa Phi, RN Phone Number: 09/04/2020, 1:23 PM  Clinical Narrative: Damaris Schooner to dtr Sonia Baller about d/c plans-return back to [facility] hip fx;ortho surgery today. Await PT eval, & recc.                   Expected Discharge Plan: Skilled Nursing Facility Barriers to Discharge: Continued Medical Work up   Patient Goals and CMS Choice Patient states their goals for this hospitalization and ongoing recovery are:: return to [place] CMS Medicare.gov Compare Post Acute Care list provided to:: Patient Represenative (must comment) Sonia Baller dtr [phone]) Choice offered to / list presented to : Adult Children  Expected Discharge Plan and Services Expected Discharge Plan: [place]   Discharge Planning Services: CM Consult Post Acute Care Choice: [place] Living arrangements for the past 2 months: [place]                                      Prior Living Arrangements/Services Living arrangements for the past 2 months: [place] Lives with:: Facility Resident Patient language and need for interpreter reviewed:: Yes Do you feel safe going back to the place where you live?: Yes      Need for Family Participation in Patient Care: No (Comment) Care giver support system in place?: Yes (comment)   Criminal Activity/Legal Involvement Pertinent to Current Situation/Hospitalization: No - Comment as needed  Activities of Daily Living Home Assistive Devices/Equipment: Eyeglasses ADL Screening (condition at time of admission) Patient's cognitive ability adequate to safely complete daily activities?: No Is the patient deaf or have difficulty hearing?: No Does the patient have difficulty seeing, even when wearing  glasses/contacts?: No Does the patient have difficulty concentrating, remembering, or making decisions?: Yes Patient able to express need for assistance with ADLs?: Yes Does the patient have difficulty dressing or bathing?: Yes Independently performs ADLs?: No Communication: Independent Dressing (OT): Needs assistance Is this a change from baseline?: Pre-admission baseline Grooming: Dependent Feeding: Independent Bathing: Needs assistance Is this a change from baseline?: Pre-admission baseline Toileting: Needs assistance Is this a change from baseline?: Pre-admission baseline In/Out Bed: Needs assistance Is this a change from baseline?: Pre-admission baseline Walks in Home: Dependent Is this a change from baseline?: Pre-admission baseline Does the patient have difficulty walking or climbing stairs?: Yes Weakness of Legs: Right Weakness of Arms/Hands: Both  Permission Sought/Granted Permission sought to share information with : Case Manager Permission granted to share information with : Yes, Verbal Permission Granted  Share Information with NAME: Case Manager     Permission granted to share info w Relationship: Sonia Baller dtr [id number]     Emotional Assessment Appearance:: Appears stated age Attitude/Demeanor/Rapport: Gracious Affect (typically observed): Accepting Orientation: : Oriented to Self Alcohol / Substance Use: Not Applicable Psych Involvement: No (comment)  Admission diagnosis:  Closed right hip fracture, initial encounter [facility]) [S72.001A] Patient Active Problem List   Diagnosis Date Noted   Closed right hip fracture, initial encounter ([place]) 09/03/2020   Alzheimer disease ([place]) 08/21/2020   Vitamin B12 deficiency 08/21/2020   Hyperlipidemia 08/21/2020   Depression, recurrent ([place]) 08/21/2020   Protein-calorie malnutrition, severe 08/19/2020   Encephalopathy acute 08/19/2020  Altered mental status 08/18/2020   Prolonged QT interval 08/18/2020    Thrombocytopenia ([place]) 08/18/2020   Failure to thrive in adult 08/18/2020   Physical deconditioning 08/18/2020   Compression fracture of body of thoracic vertebra ([place]) 06/08/2020   Low back pain 03/27/2020   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   Pain of left hand 02/14/2019   Tibialis posterior tendinitis, right 07/08/2016   Ankle sprain 06/30/2016   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 [place] [address] Phone: [phone] Fax: [place], [place] [address] Phone: [phone] Fax: [phone]     Social Determinants of Health (SDOH) Interventions    Readmission Risk Interventions No flowsheet data found.

## 2020-09-04 NOTE — Plan of Care (Signed)
  Problem: Education: Goal: Knowledge of General Education information will improve Description: Including pain rating scale, medication(s)/side effects and non-pharmacologic comfort measures Outcome: Not Progressing   Problem: Health Behavior/Discharge Planning: Goal: Ability to manage health-related needs will improve Outcome: Not Progressing   Problem: Clinical Measurements: Goal: Ability to maintain clinical measurements within normal limits will improve Outcome: Not Progressing   Problem: Activity: Goal: Risk for activity intolerance will decrease Outcome: Not Progressing   

## 2020-09-04 NOTE — Anesthesia Postprocedure Evaluation (Signed)
Anesthesia Post Note  Patient: Tanya Walker  Procedure(s) Performed: ANTERIOR APPROACH HEMI HIP ARTHROPLASTY (Right)     Patient location during evaluation: Nursing Unit Anesthesia Type: Spinal Level of consciousness: oriented and awake and alert Pain management: pain level controlled Vital Signs Assessment: post-procedure vital signs reviewed and stable Respiratory status: spontaneous breathing and respiratory function stable Cardiovascular status: blood pressure returned to baseline and stable Postop Assessment: no headache, no backache, no apparent nausea or vomiting and patient able to bend at knees Anesthetic complications: no   No notable events documented.  Last Vitals:  Vitals:   09/04/20 1815 09/04/20 1832  BP: (!) 147/85 (!) 151/86  Pulse: 82 85  Resp: 16 20  Temp: (!) 36.4 C (!) 36.4 C  SpO2: 100% 95%    Last Pain:  Vitals:   09/04/20 1832  TempSrc: Oral  PainSc:                  Barnet Glasgow

## 2020-09-04 NOTE — Anesthesia Procedure Notes (Addendum)
Spinal  Patient location during procedure: OR Start time: 09/04/2020 3:33 PM End time: 09/04/2020 3:39 PM Reason for block: surgical anesthesia Staffing Anesthesiologist: Barnet Glasgow, MD Preanesthetic Checklist Completed: patient identified, IV checked, site marked, risks and benefits discussed, surgical consent, monitors and equipment checked, pre-op evaluation and timeout performed Spinal Block Patient position: left lateral decubitus Prep: DuraPrep Patient monitoring: heart rate, cardiac monitor, continuous pulse ox and blood pressure Approach: midline Location: L3-4 Injection technique: single-shot Needle Needle type: Sprotte  Needle gauge: 24 G Needle length: 9 cm Needle insertion depth: 6 cm Assessment Sensory level: T4 Events: CSF return Additional Notes Pt tolerated procedure well

## 2020-09-04 NOTE — Progress Notes (Addendum)
Initial Nutrition Assessment  DOCUMENTATION CODES:   Severe malnutrition in context of chronic illness, Underweight  INTERVENTION:   Once diet advanced: -Boost Plus chocolate TID- Each supplement provides 360kcal and 14g protein.    -mixed with ice cream  -Multivitamin with minerals daily  NUTRITION DIAGNOSIS:   Severe Malnutrition related to chronic illness (Alzheimer's dementia) as evidenced by severe fat depletion, severe muscle depletion.  GOAL:   Patient will meet greater than or equal to 90% of their needs  MONITOR:   Diet advancement, Labs, Weight trends, I & O's  REASON FOR ASSESSMENT:   Consult, Malnutrition Screening Tool Hip fracture protocol  ASSESSMENT:   80 year old female with Alzheimer's dementia presented from nursing facility with a fall and right hip pain.CT scan of the right hip confirmed right hip fracture.  Patient currently NPO, awaiting surgery for right hip fracture. Pt with dementia, alert/oriented x 3. Pt refused meals yesterday when on diet. Poor appetite. Pt has liked Boost in the past, will order mixed with ice cream.   Per weight records, pt has lost  5 lbs since 5/2 (5% wt loss x 3 months, insignificant for time frame). Any weight loss is concerning when pt is underweight.  Medications: Colace, Folic acid, Vitamin 0000000, Lactated ringers   Labs reviewed.  NUTRITION - FOCUSED PHYSICAL EXAM:  Flowsheet Row Most Recent Value  Orbital Region Moderate depletion  Upper Arm Region Severe depletion  Thoracic and Lumbar Region Unable to assess  Buccal Region Severe depletion  Temple Region Moderate depletion  Clavicle Bone Region Severe depletion  Clavicle and Acromion Bone Region Severe depletion  Scapular Bone Region Moderate depletion  Dorsal Hand Moderate depletion  Patellar Region Moderate depletion  Anterior Thigh Region Moderate depletion  Posterior Calf Region Moderate depletion  Edema (RD Assessment) None  Hair Reviewed   Eyes Reviewed  Mouth Reviewed  Skin Reviewed       Diet Order:   Diet Order             Diet NPO time specified Except for: Sips with Meds  Diet effective midnight                   EDUCATION NEEDS:   No education needs have been identified at this time  Skin:  Skin Assessment: Reviewed RN Assessment  Last BM:  PTA  Height:   Ht Readings from Last 1 Encounters:  09/03/20 '5\' 4"'$  (1.626 m)    Weight:   Wt Readings from Last 1 Encounters:  09/03/20 40.8 kg    BMI:  Body mass index is 15.45 kg/m.  Estimated Nutritional Needs:   Kcal:  1600-1800  Protein:  75-85g  Fluid:  1.8L/day   Clayton Bibles, MS, RD, LDN Inpatient Clinical Dietitian Contact information available via Amion

## 2020-09-04 NOTE — Progress Notes (Signed)
[facility]                                                                              Patient Demographics  Tanya Walker, is a 80 y.o. female, DOB - 1941-01-10, RD:[id number]  Admit date - 09/03/2020   Admitting Physician Rise Patience, MD  Outpatient Primary MD for the patient is Plotnikov, [name], MD  Outpatient specialists:   LOS - 1  days   Medical records reviewed and are as summarized below:    chief complaint on file: fall       Brief summary   Patient is a 80 year old female with Alzheimer's dementia presented from nursing facility with a fall and right hip pain.  Patient reported that she woke up around 4 AM and was changing when she felt slightly dizzy, fell and hit her head but did not lose consciousness.  After the fall she had right hip pain. CT scan of the right hip confirmed right hip fracture.   Assessment & Plan    Principal Problem: Mechanical fall with closed right hip fracture (HCC) -Continue n.p.o. status, placed on gentle hydration while n.p.o. -Continue pain control, added Percocet and fentanyl as needed, bowel regimen -Orthopedics consulted, plan for the OR today -PT evaluation after the surgery -will check vitamin D level  Active Problems:   Alzheimer disease ([name]) -Continue Ativan, Lexapro  Elevated BP -Not on any antihypertensives outpatient, likely due to pain -For now, will add IV hydralazine as needed with parameters    Underweight Estimated body mass index is 15.45 kg/m as calculated from the following:   Height as of this encounter: '5\' 4"'$  (1.626 m).   Weight as of this encounter: 40.8 kg. -Will add nutritional supplements once tolerating diet  Code Status: full code  DVT Prophylaxis:  SCDs Start: 09/03/20 1114   Level of Care: Level of care: Telemetry Family Communication: Discussed all imaging results, lab results, explained to the patient    Disposition Plan:     Status is:  Inpatient  Remains inpatient appropriate because:Inpatient level of care appropriate due to severity of illness  Dispo: The patient is from: SNF              Anticipated d/c is to: SNF              Patient currently is not medically stable to d/c.  Pending surgery today   Difficult to place patient No      Time Spent in minutes   82mns   Procedures:  None   Consultants:   Orthopedics  Antimicrobials:   Anti-infectives (From admission, onward)    None          Medications  Scheduled Meds:  docusate sodium  100 mg Oral QHS   escitalopram  10 mg Oral QPM   folic acid  0.5 mg Oral Daily   And   vitamin B-12  500 mcg Oral Daily   gabapentin  100 mg Oral TID   melatonin  10 mg Oral QHS   Continuous Infusions:  methocarbamol (ROBAXIN) IV 500 mg (09/03/20 1459)   PRN Meds:.fentaNYL (SUBLIMAZE) injection, LORazepam,  methocarbamol (ROBAXIN) IV      Subjective:   Tanya Walker was seen and examined today.  Complaining of pain in the right hip with movement.  Patient denies dizziness, chest pain, shortness of breath, abdominal pain, nausea vomiting or diarrhea.  No fevers  Objective:   Vitals:   09/03/20 1500 09/03/20 1730 09/03/20 2032 09/04/20 0541  BP: (!) 166/85 (!) 173/83 (!) 155/89 (!) 169/80  Pulse: 90 74 77 78  Resp: (!) 22 (!) '21 20 20  '$ Temp:  (!) 97.5 F (36.4 C) 97.9 F (36.6 C) 98.1 F (36.7 C)  TempSrc:  Oral Oral Oral  SpO2: 99% 98% 99% 99%  Weight:      Height:        Intake/Output Summary (Last 24 hours) at 09/04/2020 1000 Last data filed at 09/04/2020 0515 Gross per 24 hour  Intake 0 ml  Output 550 ml  Net -550 ml     Wt Readings from Last 3 Encounters:  09/03/20 40.8 kg  08/26/20 38.4 kg  08/25/20 38.4 kg     Exam General: Alert and oriented x self and place, NAD, pleasant Cardiovascular: S1 S2 auscultated, RRR Respiratory: Clear to auscultation bilaterally, no wheezing, rales or rhonchi Gastrointestinal: Soft, nontender,  nondistended, + bowel sounds Ext: no pedal edema bilaterally Neuro: limited exam, strength 5/5 in upper extremities and in left lower extremity Skin: No rashes Psych: Normal affect and demeanor, oriented to self and place, knows she is in New Site:  I have personally reviewed following labs and imaging studies  Micro Results Recent Results (from the past 240 hour(s))  SARS CORONAVIRUS 2 (TAT 6-24 HRS) Nasopharyngeal Nasopharyngeal Swab     Status: None   Collection Time: 09/03/20 10:02 AM   Specimen: Nasopharyngeal Swab  Result Value Ref Range Status   SARS Coronavirus 2 NEGATIVE NEGATIVE Final    Comment: (NOTE) SARS-CoV-2 target nucleic acids are NOT DETECTED.  The SARS-CoV-2 RNA is generally detectable in upper and lower respiratory specimens during the acute phase of infection. Negative results do not preclude SARS-CoV-2 infection, do not rule out co-infections with other pathogens, and should not be used as the sole basis for treatment or other patient management decisions. Negative results must be combined with clinical observations, patient history, and epidemiological information. The expected result is Negative.  Fact Sheet for Patients: SugarRoll.be  Fact Sheet for Healthcare Providers: https://www.woods-mathews.com/  This test is not yet approved or cleared by the [place] FDA and  has been authorized for detection and/or diagnosis of SARS-CoV-2 by FDA under an Emergency Use Authorization (EUA). This EUA will remain  in effect (meaning this test can be used) for the duration of the COVID-19 declaration under Se ction 564(b)(1) of the Act, 21 U.S.C. section 360bbb-3(b)(1), unless the authorization is terminated or revoked sooner.  Performed at [facility], [place] [address] [place], [place] [address]     Radiology Reports CT Head Wo Contrast  Result Date: 09/03/2020 CLINICAL DATA:   80 year old female status post unwitnessed fall, posterior head injury. EXAM: CT HEAD WITHOUT CONTRAST TECHNIQUE: Contiguous axial images were obtained from the base of the skull through the vertex without intravenous contrast. COMPARISON:  Head CT 08/18/2020. FINDINGS: Brain: Stable cerebral volume. No midline shift, ventriculomegaly, mass effect, evidence of mass lesion, intracranial hemorrhage or evidence of cortically based acute infarction. Patchy and confluent bilateral white matter hypodensity is stable. Small perivascular space left posterior lentiform, normal variant. Vascular: Calcified atherosclerosis at the  skull base. No suspicious intracranial vascular hyperdensity. Skull: Mild motion artifact today.  Stable, no fracture identified. Sinuses/Orbits: Visualized paranasal sinuses and mastoids are stable and well aerated. Other: No discrete scalp or orbits soft tissue injury identified. IMPRESSION: 1. No acute traumatic injury identified. 2. Stable non contrast CT appearance of chronic white matter disease. Electronically Signed   By: Genevie Ann M.D.   On: 09/03/2020 08:26   CT Head Wo Contrast  Result Date: 08/18/2020 CLINICAL DATA:  80 year old female with altered mental status. EXAM: CT HEAD WITHOUT CONTRAST TECHNIQUE: Contiguous axial images were obtained from the base of the skull through the vertex without intravenous contrast. COMPARISON:  None. FINDINGS: Brain: Moderate age-related atrophy and chronic microvascular ischemic changes. 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 There is no acute intracranial hemorrhage. No mass effect or midline shift. No extra-axial fluid collection. Vascular: No hyperdense vessel or unexpected calcification. Skull: Normal. Negative for fracture or focal lesion. Sinuses/Orbits: No acute finding. Other: None IMPRESSION: 1. No acute intracranial pathology. 2. Moderate age-related atrophy and chronic microvascular ischemic changes. Electronically Signed    By: Anner Crete M.D.   On: 08/18/2020 19:59   CT Abdomen Pelvis W Contrast  Result Date: 08/18/2020 CLINICAL DATA:  Altered mental status and lethargy. EXAM: CT ABDOMEN AND PELVIS WITH CONTRAST TECHNIQUE: Multidetector CT imaging of the abdomen and pelvis was performed using the standard protocol following bolus administration of intravenous contrast. CONTRAST:  75 mL OMNIPAQUE IOHEXOL 350 MG/ML SOLN COMPARISON:  CT thoracic spine 04/16/2020. FINDINGS: Lower chest: There is cardiomegaly. No pleural or pericardial effusion. Lung bases clear. Hepatobiliary: No focal liver abnormality is seen. Status post cholecystectomy. No biliary dilatation. Pancreas: Unremarkable. No pancreatic ductal dilatation or surrounding inflammatory changes. Spleen: Normal in size. No focal lesion. A few calcifications in the spleen are consistent with old granulomatous disease. Adrenals/Urinary Tract: Adrenal glands are unremarkable. Kidneys are normal, without renal calculi, focal lesion, or hydronephrosis. Bladder is distended but otherwise unremarkable. Stomach/Bowel: Stomach is within normal limits. Status post appendectomy. No evidence of bowel wall thickening, distention, or inflammatory changes. Vascular/Lymphatic: Aortic atherosclerosis. No enlarged abdominal or pelvic lymph nodes. Reproductive: Uterus and bilateral adnexa are unremarkable. Other: None. Musculoskeletal: The patient has T11, T12 and L1 compression fractures. The T11 and L1 fractures are new since the prior thoracic spine CT. Vertebral body height loss at T11 is up to 80% anteriorly and there is approximately 60% vertebral body height loss at L1. Minimal retropulsion off the superior endplate of 624THL. Mild bony retropulsion is seen off the superior endplate of L1. Remote T12 compression fracture where the patient is status post vertebral augmentation noted. No other acute bony abnormality is seen. No lytic or sclerotic lesion. IMPRESSION: T11 and L1  compression fractures are new since the 04/16/2020 thoracic spine CT. Vertebral body height loss is worse at T11 where it is up to approximately 80%. There is mild bony retropulsion off the superior endplate of L1 which appears to cause mild central canal stenosis at T12-L1. Minimal retropulsion of seen off the superior endplate of 624THL Status post vertebral augmentation at L1 for compression fracture seen on the prior CT. Distended urinary bladder. Cause for this finding is not identified and may be incidental. Cardiomegaly. Aortic Atherosclerosis (ICD10-I70.0). Electronically Signed   By: Inge Rise M.D.   On: 08/18/2020 20:03   CT Hip Right Wo Contrast  Result Date: 09/03/2020 CLINICAL DATA:  Right hip pain since the patient suffered a fall last night. Initial encounter.  EXAM: CT OF THE RIGHT HIP WITHOUT CONTRAST TECHNIQUE: Multidetector CT imaging of the right hip was performed according to the standard protocol. Multiplanar CT image reconstructions were also generated. COMPARISON:  Plain films right hip earlier today. FINDINGS: Bones/Joint/Cartilage The patient has an acute subcapital fracture of the right hip. The fracture is mildly impacted and there is some medial displacement the neck of the femur. No other acute abnormality is seen. No focal lesion. Small right hip joint effusion due to the fracture is noted. Ligaments Suboptimally assessed by CT. Muscles and Tendons Intact. Soft tissues Negative. There is some stranding in subcutaneous fat about the right hip likely due to contusion. IMPRESSION: Acute subcapital fracture right hip as described. Electronically Signed   By: Inge Rise M.D.   On: 09/03/2020 09:22   DG Chest Port 1 View  Result Date: 08/18/2020 CLINICAL DATA:  Diffuse weakness EXAM: PORTABLE CHEST 1 VIEW COMPARISON:  None. FINDINGS: Cardiac shadow is within normal limits. Lungs are well aerated bilaterally. No focal infiltrate or sizable effusion is noted. Aortic  calcifications are seen. No bony abnormality is noted. IMPRESSION: No acute abnormality noted. Electronically Signed   By: Inez Catalina M.D.   On: 08/18/2020 18:07   DG Knee Complete 4 Views Right  Result Date: 09/03/2020 CLINICAL DATA:  Pain status post fall. EXAM: RIGHT KNEE - COMPLETE 4+ VIEW COMPARISON:  None. FINDINGS: No evidence of fracture, dislocation, or joint effusion. No evidence of arthropathy or other focal bone abnormality. Soft tissues are unremarkable. IMPRESSION: No acute fracture or dislocation of the right knee. Electronically Signed   By: Miachel Roux M.D.   On: 09/03/2020 07:53   DG Hip Unilat W or Wo Pelvis 2-3 Views Right  Result Date: 09/03/2020 CLINICAL DATA:  Diffuse right hip pain status post fall EXAM: DG HIP (WITH OR WITHOUT PELVIS) 2-3V RIGHT COMPARISON:  None. FINDINGS: There is subtle lucency in the right femoral neck suspicious for nondisplaced fracture. Evaluation is significantly limited due to patient positioning and overlying structures. IMPRESSION: Subtle lucency in the right femoral neck is suspicious for nondisplaced fracture. Current images are limited. Further evaluation with CT would be beneficial. Electronically Signed   By: Miachel Roux M.D.   On: 09/03/2020 07:52    Lab Data:  CBC: Recent Labs  Lab 09/03/20 0700 09/03/20 0716  WBC 12.4*  --   NEUTROABS 11.0*  --   HGB 13.4 13.9  HCT 41.5 41.0  MCV 94.7  --   PLT 165  --    Basic Metabolic Panel: Recent Labs  Lab 09/03/20 0716  NA 136  K 3.7  CL 100  GLUCOSE 86  BUN 16  CREATININE 0.50   GFR: Estimated Creatinine Clearance: 35.5 mL/min (by C-G formula based on SCr of 0.5 mg/dL). Liver Function Tests: No results for input(s): AST, ALT, ALKPHOS, BILITOT, PROT, ALBUMIN in the last 168 hours. No results for input(s): LIPASE, AMYLASE in the last 168 hours. No results for input(s): AMMONIA in the last 168 hours. Coagulation Profile: No results for input(s): INR, PROTIME in the last 168  hours. Cardiac Enzymes: No results for input(s): CKTOTAL, CKMB, CKMBINDEX, TROPONINI in the last 168 hours. BNP (last 3 results) No results for input(s): PROBNP in the last 8760 hours. HbA1C: No results for input(s): HGBA1C in the last 72 hours. CBG: No results for input(s): GLUCAP in the last 168 hours. Lipid Profile: No results for input(s): CHOL, HDL, LDLCALC, TRIG, CHOLHDL, LDLDIRECT in the last 72 hours. Thyroid  Function Tests: No results for input(s): TSH, T4TOTAL, FREET4, T3FREE, THYROIDAB in the last 72 hours. Anemia Panel: No results for input(s): VITAMINB12, FOLATE, FERRITIN, TIBC, IRON, RETICCTPCT in the last 72 hours. Urine analysis:    Component Value Date/Time   COLORURINE YELLOW 09/03/2020 0831   APPEARANCEUR CLEAR 09/03/2020 0831   LABSPEC 1.020 09/03/2020 0831   PHURINE 6.0 09/03/2020 0831   GLUCOSEU NEGATIVE 09/03/2020 0831   GLUCOSEU NEGATIVE 12/05/2016 1056   HGBUR NEGATIVE 09/03/2020 0831   BILIRUBINUR NEGATIVE 09/03/2020 0831   KETONESUR 80 (A) 09/03/2020 0831   PROTEINUR NEGATIVE 09/03/2020 0831   UROBILINOGEN 0.2 12/05/2016 1056   NITRITE NEGATIVE 09/03/2020 0831   LEUKOCYTESUR NEGATIVE 09/03/2020 0831     Alaia Lordi M.D. [facility] 09/04/2020, 10:00 AM  Available via Epic secure chat 7am-7pm After 7 pm, please refer to night coverage provider listed on amion.

## 2020-09-04 NOTE — Interval H&P Note (Signed)
History and Physical Interval Note:  09/04/2020 3:04 PM  Tanya Walker  has presented today for surgery, with the diagnosis of FRACTURED RIGHT HIP.  The various methods of treatment have been discussed with the patient and family. After consideration of risks, benefits and other options for treatment, the patient has consented to  Procedure(s): ANTERIOR APPROACH HEMI HIP ARTHROPLASTY (Right) as a surgical intervention.  The patient's history has been reviewed, patient examined, no change in status, stable for surgery.  I have reviewed the patient's chart and labs.  Questions were answered to the patient's satisfaction.    The risks, benefits, and alternatives were discussed with the patient. There are risks associated with the surgery including, but not limited to, problems with anesthesia (death), infection, instability (giving out of the joint), dislocation, differences in leg length/angulation/rotation, fracture of bones, loosening or failure of implants, hematoma (blood accumulation) which may require surgical drainage, blood clots, pulmonary embolism, nerve injury (foot drop and lateral thigh numbness), and blood vessel injury. The patient understands these risks and elects to proceed.    Hilton Cork Tashera Montalvo

## 2020-09-04 NOTE — Transfer of Care (Signed)
Immediate Anesthesia Transfer of Care Note  Patient: Tanya Walker  Procedure(s) Performed: Procedure(s): ANTERIOR APPROACH HEMI HIP ARTHROPLASTY (Right)  Patient Location: PACU  Anesthesia Type:Spinal  Level of Consciousness: awake, alert  and oriented  Airway & Oxygen Therapy: Patient Spontanous Breathing  Post-op Assessment: Report given to RN and Post -op Vital signs reviewed and stable  Post vital signs: Reviewed and stable  Last Vitals:  Vitals:   09/04/20 0541 09/04/20 1457  BP: (!) 169/80 (!) 164/86  Pulse: 78 92  Resp: 20 16  Temp: 36.7 C 36.9 C  SpO2: [id number]    Complications: No apparent anesthesia complications

## 2020-09-04 NOTE — Discharge Instructions (Signed)
? ?Dr. Illias Pantano ?Joint Replacement Specialist ?[facility] ?[address] ?[place], [place] [address] ?[phone] ? ? ?TOTAL HIP REPLACEMENT POSTOPERATIVE DIRECTIONS ? ? ? ?Hip Rehabilitation, Guidelines Following Surgery  ? ?WEIGHT BEARING ?Weight bearing as tolerated with assist device (walker, cane, etc) as directed, use it as long as suggested by your surgeon or therapist, typically at least 4-6 weeks. ? ?The results of a hip operation are greatly improved after range of motion and muscle strengthening exercises. Follow all safety measures which are given to protect your hip. If any of these exercises cause increased pain or swelling in your joint, decrease the amount until you are comfortable again. Then slowly increase the exercises. Call your caregiver if you have problems or questions.  ? ?HOME CARE INSTRUCTIONS  ?Most of the following instructions are designed to prevent the dislocation of your new hip.  ?Remove items at home which could result in a fall. This includes throw rugs or furniture in walking pathways.  ?Continue medications as instructed at time of discharge. ?You may have some home medications which will be placed on hold until you complete the course of blood thinner medication. ?You may start showering once you are discharged home. Do not remove your dressing. ?Do not put on socks or shoes without following the instructions of your caregivers.   ?Sit on chairs with arms. Use the chair arms to help push yourself up when arising.  ?Arrange for the use of a toilet seat elevator so you are not sitting low.  ?Walk with walker as instructed.  ?You may resume a sexual relationship in one month or when given the OK by your caregiver.  ?Use walker as long as suggested by your caregivers.  ?You may put full weight on your legs and walk as much as is comfortable. ?Avoid periods of inactivity such as sitting longer than an hour when not asleep. This helps prevent blood  clots.  ?You may return to work once you are cleared by your surgeon.  ?Do not drive a car for 6 weeks or until released by your surgeon.  ?Do not drive while taking narcotics.  ?Wear elastic stockings for two weeks following surgery during the day but you may remove then at night.  ?Make sure you keep all of your appointments after your operation with all of your doctors and caregivers. You should call the office at the above phone number and make an appointment for approximately two weeks after the date of your surgery. ?Please pick up a stool softener and laxative for home use as long as you are requiring pain medications. ?ICE to the affected hip every three hours for 30 minutes at a time and then as needed for pain and swelling. Continue to use ice on the hip for pain and swelling from surgery. You may notice swelling that will progress down to the foot and ankle.  This is normal after surgery.  Elevate the leg when you are not up walking on it.   ?It is important for you to complete the blood thinner medication as prescribed by your doctor. ?Continue to use the breathing machine which will help keep your temperature down.  It is common for your temperature to cycle up and down following surgery, especially at night when you are not up moving around and exerting yourself.  The breathing machine keeps your lungs expanded and your temperature down. ? ?RANGE OF MOTION AND STRENGTHENING EXERCISES  ?These exercises are designed to help you   keep full movement of your hip joint. Follow your caregiver's or physical therapist's instructions. Perform all exercises about fifteen times, three times per day or as directed. Exercise both hips, even if you have had only one joint replacement. These exercises can be done on a training (exercise) mat, on the floor, on a table or on a bed. Use whatever works the best and is most comfortable for you. Use music or television while you are exercising so that the exercises are a  pleasant break in your day. This will make your life better with the exercises acting as a break in routine you can look forward to.  ?Lying on your back, slowly slide your foot toward your buttocks, raising your knee up off the floor. Then slowly slide your foot back down until your leg is straight again.  ?Lying on your back spread your legs as far apart as you can without causing discomfort.  ?Lying on your side, raise your upper leg and foot straight up from the floor as far as is comfortable. Slowly lower the leg and repeat.  ?Lying on your back, tighten up the muscle in the front of your thigh (quadriceps muscles). You can do this by keeping your leg straight and trying to raise your heel off the floor. This helps strengthen the largest muscle supporting your knee.  ?Lying on your back, tighten up the muscles of your buttocks both with the legs straight and with the knee bent at a comfortable angle while keeping your heel on the floor.  ? ?SKILLED REHAB INSTRUCTIONS: ?If the patient is transferred to a skilled rehab facility following release from the hospital, a list of the current medications will be sent to the facility for the patient to continue.  When discharged from the skilled rehab facility, please have the facility set up the patient's Home Health Physical Therapy prior to being released. Also, the skilled facility will be responsible for providing the patient with their medications at time of release from the facility to include their pain medication and their blood thinner medication. If the patient is still at the rehab facility at time of the two week follow up appointment, the skilled rehab facility will also need to assist the patient in arranging follow up appointment in our office and any transportation needs. ? ?POST-OPERATIVE OPIOID TAPER INSTRUCTIONS: ?It is important to wean off of your opioid medication as soon as possible. If you do not need pain medication after your surgery it is ok  to stop day one. ?Opioids include: ?Codeine, Hydrocodone(Norco, Vicodin), Oxycodone(Percocet, oxycontin) and hydromorphone amongst others.  ?Long term and even short term use of opiods can cause: ?Increased pain response ?Dependence ?Constipation ?Depression ?Respiratory depression ?And more.  ?Withdrawal symptoms can include ?Flu like symptoms ?Nausea, vomiting ?And more ?Techniques to manage these symptoms ?Hydrate well ?Eat regular healthy meals ?Stay active ?Use relaxation techniques(deep breathing, meditating, yoga) ?Do Not substitute Alcohol to help with tapering ?If you have been on opioids for less than two weeks and do not have pain than it is ok to stop all together.  ?Plan to wean off of opioids ?This plan should start within one week post op of your joint replacement. ?Maintain the same interval or time between taking each dose and first decrease the dose.  ?Cut the total daily intake of opioids by one tablet each day ?Next start to increase the time between doses. ?The last dose that should be eliminated is the evening dose.  ? ? ?MAKE   SURE YOU:  ?Understand these instructions.  ?Will watch your condition.  ?Will get help right away if you are not doing well or get worse. ? ?Pick up stool softner and laxative for home use following surgery while on pain medications. ?Do not remove your dressing. ?The dressing is waterproof--it is OK to take showers. ?Continue to use ice for pain and swelling after surgery. ?Do not use any lotions or creams on the incision until instructed by your surgeon. ?Total Hip Protocol. ? ?

## 2020-09-04 NOTE — Plan of Care (Signed)
  Problem: Education: Goal: Knowledge of General Education information will improve Description: Including pain rating scale, medication(s)/side effects and non-pharmacologic comfort measures Outcome: Progressing   Problem: Clinical Measurements: Goal: Will remain free from infection Outcome: Progressing   Problem: Education: Goal: Knowledge of the prescribed therapeutic regimen will improve Outcome: Progressing   Problem: Pain Management: Goal: Pain level will decrease with appropriate interventions Outcome: Progressing

## 2020-09-04 NOTE — Op Note (Signed)
OPERATIVE REPORT  SURGEON: Rod Can, MD   ASSISTANT: Cherlynn June, PA-C  PREOPERATIVE DIAGNOSIS: Displaced Right femoral neck fracture.   POSTOPERATIVE DIAGNOSIS: Displaced Right femoral neck fracture.   PROCEDURE: Right hip hemiarthroplasty, anterior approach.   IMPLANTS: DePuy Tri Lock stem, size 7, std offset, with a -3 mm spacer and a 44 mm monopolar head ball. Dall-Miles 2.0 mm adult reconstruction cable.  ANESTHESIA:  MAC and Spinal  ANTIBIOTICS: 2g ancef.  ESTIMATED BLOOD LOSS:-300 mL    DRAINS: None.  COMPLICATIONS: None   CONDITION: PACU - hemodynamically stable.   BRIEF CLINICAL NOTE: Tanya Walker is a 80 y.o. female with a displaced Right femoral neck fracture. The patient was admitted to the hospitalist service and underwent perioperative risk stratification and medical optimization. The risks, benefits, and alternatives to hemiarthroplasty were explained, and the patient elected to proceed.  PROCEDURE IN DETAIL: The patient was taken to the operating room and general anesthesia was induced on the hospital bed.  The patient was then positioned on the Hana table.  All bony prominences were well padded.  The hip was prepped and draped in the normal sterile surgical fashion.  A time-out was called verifying side and site of surgery. Antibiotics were given within 60 minutes of beginning the procedure.   Bikini incision was made, and te direct anterior approach to the hip was performed through the Hueter interval.  Lateral femoral circumflex vessels were treated with the Auqumantys. The anterior capsule was exposed and an inverted T capsulotomy was made.  Fracture hematoma was encountered and evacuated. The patient was found to have a comminuted Right subcapital femoral neck fracture.  I freshened the femoral neck cut with a saw.  I removed the femoral neck fragment.  A corkscrew was placed into the head and the head was removed.  This was passed to the back table and  was measured. The pubofemoral ligament was released subperiosteally to the lesser trochanter.   Acetabular exposure was achieved.  I examined the articular cartilage which was intact.  The labrum was intact. A 44 mm trial head was placed and found to have excellent fit.   I then gained femoral exposure taking care to protect the abductors and greater trochanter.  This was performed using standard external rotation, extension, and adduction.  The superior capsule was incised, taking care to stay lateral to the posterior border of the femoral neck. There was a nondisplaced V shaped defect in the calcar. I elected to place a prophylactic subperiosteal adult reconstruction cable low on the femoral neck. A cookie cutter was used to enter the femoral canal, and then the femoral canal finder was used to confirm location.  I then sequentially broached up to a size 7.  Calcar planer was used on the femoral neck remnant.  I paced a std neck and a 36 + 1.5 head ball. The hip was reduced.  Leg lengths were checked fluoroscopically.  The hip was dislocated and trial components were removed.  I placed the real stem followed by the real spacer and head ball.  A single reduction maneuver was performed and the hip was reduced.  Fluoroscopy was used to confirm component position and leg lengths.  At 90 degrees of external rotation and extension, the hip was stable to an anterior directed force.   The wound was copiously irrigated with Irrisept solution and normal saline using pule lavage.  Marcaine solution was injected into the periarticular soft tissue.  The wound was closed in layers  using #1 Vicryl and V-Loc for the fascia, 2-0 Vicryl for the subcutaneous fat, 2-0 Monocryl for the deep dermal layer, and skin staples + Dermabond.  Once the glue was fully dried, an Aquacell Ag dressing was applied.  The patient was then awakened from anesthesia and transported to the recovery room in stable condition.  Sponge, needle, and  instrument counts were correct at the end of the case x2.  The patient tolerated the procedure well and there were no known complications.  Please note that a surgical assistant was a medical necessity for this procedure to perform it in a safe and expeditious manner. Assistant was necessary to provide appropriate retraction of vital neurovascular structures, to prevent femoral fracture, and to allow for anatomic placement of the prosthesis.

## 2020-09-05 DIAGNOSIS — G309 Alzheimer's disease, unspecified: Secondary | ICD-10-CM | POA: Diagnosis not present

## 2020-09-05 DIAGNOSIS — S72001A Fracture of unspecified part of neck of right femur, initial encounter for closed fracture: Secondary | ICD-10-CM | POA: Diagnosis not present

## 2020-09-05 DIAGNOSIS — F0280 Dementia in other diseases classified elsewhere without behavioral disturbance: Secondary | ICD-10-CM | POA: Diagnosis not present

## 2020-09-05 LAB — BASIC METABOLIC PANEL
Anion gap: 7 (ref 5–15)
BUN: 23 mg/dL (ref 8–23)
CO2: 25 mmol/L (ref 22–32)
Calcium: 8.6 mg/dL — ABNORMAL LOW (ref 8.9–10.3)
Chloride: 101 mmol/L (ref 98–111)
Creatinine, Ser: 0.59 mg/dL (ref 0.44–1.00)
GFR, Estimated: >60 mL/min (ref >60)
Glucose, Bld: 110 mg/dL — ABNORMAL HIGH (ref 70–99)
Potassium: 3.2 mmol/L — ABNORMAL LOW (ref 3.5–5.1)
Sodium: 133 mmol/L — ABNORMAL LOW (ref 135–145)

## 2020-09-05 LAB — CBC
HCT: 34.3 % — ABNORMAL LOW (ref 36.0–46.0)
Hemoglobin: 11.4 g/dL — ABNORMAL LOW (ref 12.0–15.0)
MCH: 30.9 pg (ref 26.0–34.0)
MCHC: 33.2 g/dL (ref 30.0–36.0)
MCV: 93 fL (ref 80.0–100.0)
Platelets: 134 10*3/uL — ABNORMAL LOW (ref 150–400)
RBC: 3.69 MIL/uL — ABNORMAL LOW (ref 3.87–5.11)
RDW: 13.2 % (ref 11.5–15.5)
WBC: 11.8 10*3/uL — ABNORMAL HIGH (ref 4.0–10.5)
nRBC: 0 % (ref 0.0–0.2)

## 2020-09-05 LAB — VITAMIN D 25 HYDROXY (VIT D DEFICIENCY, FRACTURES): Vit D, 25-Hydroxy: 35.76 ng/mL (ref 30–100)

## 2020-09-05 MED ORDER — HYDROCODONE-ACETAMINOPHEN 5-325 MG PO TABS: 1.0000 | ORAL_TABLET | ORAL | 0 refills | Status: DC | PRN

## 2020-09-05 MED ORDER — SENNOSIDES-DOCUSATE SODIUM 8.6-50 MG PO TABS
1.0000 | ORAL_TABLET | Freq: Two times a day (BID) | ORAL | Status: DC
  Administered 2020-09-05 – 2020-09-07 (×6): 1 via ORAL
  Filled 2020-09-05 (×6): qty 1

## 2020-09-05 MED ORDER — POLYETHYLENE GLYCOL 3350 17 G PO PACK
17.0000 g | PACK | Freq: Every day | ORAL | Status: DC | PRN
  Administered 2020-09-06: 17 g via ORAL
  Filled 2020-09-05: qty 1

## 2020-09-05 MED ORDER — ASPIRIN 81 MG PO CHEW: 81.0000 mg | CHEWABLE_TABLET | Freq: Two times a day (BID) | ORAL | 0 refills | Status: AC

## 2020-09-05 NOTE — Evaluation (Addendum)
Physical Therapy Evaluation Patient Details Name: Tanya Walker MRN: [id number] DOB: 1940-03-29 Today's Date: 09/05/2020   History of Present Illness  80 yo female s/p R hip hemi-direct anterior 09/04/20. Hx of Alz, compression fractures s/p KP.  Clinical Impression  On eval, pt required Min-Mod A +2 safety/equipment for mobility. She walked ~5 feet with a RW. Pt presents with general weakness, decreased activity tolerance, and impaired gait and balance. She was pleasant and followed 1 step commands well. Will follow and progress activity as tolerated.     Follow Up Recommendations SNF    Equipment Recommendations   (TBD at next venue)    Recommendations for Other Services       Precautions / Restrictions Precautions Precautions: Fall Restrictions Weight Bearing Restrictions: No RLE Weight Bearing: Weight bearing as tolerated      Mobility  Bed Mobility Overal bed mobility: Needs Assistance Bed Mobility: Supine to Sit     Supine to sit: Mod assist     General bed mobility comments: Assist for trunk and LEs. Increased time. Cues for safety, technique. Uxed bedpad to assist with scooting, positioning. Posterior lean-pt became anxious and fearful of falling initially but became more calm with cues and time.    Transfers Overall transfer level: Needs assistance Equipment used: Rolling walker (2 wheeled) Transfers: Sit to/from Stand Sit to Stand: Min assist;+2 physical assistance;+2 safety/equipment         General transfer comment: Assist to rise, steady, control descent. Cues for safety, technique, hand placement.  Ambulation/Gait Ambulation/Gait assistance: Min assist;+2 safety/equipment Gait Distance (Feet): 5 Feet Assistive device: Rolling walker (2 wheeled) Gait Pattern/deviations: Step-to pattern;Narrow base of support;Trunk flexed     General Gait Details: Assist to stabilize pt and manage RW. Cues for safety, technique, sequence. Followed closely with  recliner.  Stairs            Wheelchair Mobility    Modified Rankin (Stroke Patients Only)       Balance Overall balance assessment: Needs assistance;History of Falls         Standing balance support: Bilateral upper extremity supported Standing balance-Leahy Scale: Poor                               Pertinent Vitals/Pain Pain Assessment: Faces Faces Pain Scale: Hurts even more Pain Location: R hip with activity Pain Descriptors / Indicators: Discomfort;Sore;Grimacing;Guarding Pain Intervention(s): Limited activity within patient's tolerance;Monitored during session;Repositioned    Home Living Family/patient expects to be discharged to:: Skilled nursing facility     Type of Home: Assisted living Home Access: Level entry     Home Layout: One level Home Equipment: Walker - 4 wheels      Prior Function Level of Independence: Independent with assistive device(s)         Comments: Patient is a questionable historian-she is not completely sure of her answers. She did report that she has recently began using a walker-?rollator.     Hand Dominance        Extremity/Trunk Assessment   Upper Extremity Assessment Upper Extremity Assessment: Generalized weakness    Lower Extremity Assessment Lower Extremity Assessment: Generalized weakness    Cervical / Trunk Assessment Cervical / Trunk Assessment: Kyphotic  Communication   Communication: No difficulties  Cognition Arousal/Alertness: Awake/alert Behavior During Therapy: WFL for tasks assessed/performed Overall Cognitive Status: History of cognitive impairments - at baseline  General Comments: follows 1 step commands fairly well. she is fearful of falling      General Comments      Exercises     Assessment/Plan    PT Assessment Patient needs continued PT services  PT Problem List Decreased strength;Decreased mobility;Decreased activity  tolerance;Decreased knowledge of use of DME;Decreased balance       PT Treatment Interventions DME instruction;Gait training;Therapeutic exercise;Balance training;Functional mobility training;Therapeutic activities;Patient/family education    PT Goals (Current goals can be found in the Care Plan section)  Acute Rehab PT Goals Patient Stated Goal: im scared of falling again PT Goal Formulation: Patient unable to participate in goal setting Time For Goal Achievement: 09/19/20 Potential to Achieve Goals: Fair    Frequency Min 3X/week   Barriers to discharge        Co-evaluation               AM-PAC PT "6 Clicks" Mobility  Outcome Measure Help needed turning from your back to your side while in a flat bed without using bedrails?: A Lot Help needed moving from lying on your back to sitting on the side of a flat bed without using bedrails?: A Lot Help needed moving to and from a bed to a chair (including a wheelchair)?: A Lot Help needed standing up from a chair using your arms (e.g., wheelchair or bedside chair)?: A Lot Help needed to walk in hospital room?: A Lot Help needed climbing 3-5 steps with a railing? : Total 6 Click Score: 11    End of Session Equipment Utilized During Treatment: Gait belt Activity Tolerance: Patient tolerated treatment well Patient left: in chair;with call bell/phone within reach;with chair alarm set   PT Visit Diagnosis: Pain;Other abnormalities of gait and mobility (R26.89) Pain - Right/Left: Right Pain - part of body: Hip    Time: 1210-1225 PT Time Calculation (min) (ACUTE ONLY): 15 min   Charges:   PT Evaluation $PT Eval Moderate Complexity: 1 Mod             Doreatha Massed, PT Acute Rehabilitation  Office: [phone] Pager: [phone]

## 2020-09-05 NOTE — Progress Notes (Signed)
[facility]                                                                              Patient Demographics  Tanya Walker, is a 80 y.o. female, DOB - Jun 11, 1940, [id number]  Admit date - 09/03/2020   Admitting Physician Rise Patience, MD  Outpatient Primary MD for the patient is Plotnikov, [name], MD  Outpatient specialists:   LOS - 2  days   Medical records reviewed and are as summarized below:    chief complaint on file: fall       Brief summary   Patient is a 80 year old female with Alzheimer's dementia presented from nursing facility with a fall and right hip pain.  Patient reported that she woke up around 4 AM and was changing when she felt slightly dizzy, fell and hit her head but did not lose consciousness.  After the fall she had right hip pain. CT scan of the right hip confirmed right hip fracture.   Assessment & Plan    Principal Problem: Mechanical fall with closed right hip fracture ([place]) -Status post right hip hemiarthroplasty, postop day #1 -Pain is currently controlled, continue pain management -Added bowel regimen -Vitamin D level 35.7  Active Problems:   Alzheimer disease (Tanya Walker) -No acute issues, continue Ativan, Lexapro  Elevated BP -Not on any antihypertensives outpatient, likely elevated BP readings due to pain -For now, will add IV hydralazine as needed with parameters    Underweight Estimated body mass index is 15.45 kg/m as calculated from the following:   Height as of this encounter: '5\' 4"'$  (1.626 m).   Weight as of this encounter: 40.8 kg. -Continue nutritional supplements TID  Code Status: full code  DVT Prophylaxis:  enoxaparin (LOVENOX) injection 30 mg Start: 09/05/20 0800 SCDs Start: 09/04/20 1831   Level of Care: Level of care: Telemetry Family Communication: Called patient's husband, did not pick up, left a detailed voicemail message   Disposition Plan:     Status is: Inpatient  Remains  inpatient appropriate because:Inpatient level of care appropriate due to severity of illness  Dispo: The patient is from: ALF              Anticipated d/c is to: SNF              Patient currently is not medically stable to d/c.  Postop day #1, will start PT today   Difficult to place patient No      Time Spent in minutes   45mns   Procedures:  Right hip hemiarthroplasty on 7/29  Consultants:   Orthopedics  Antimicrobials:   Anti-infectives (From admission, onward)    Start     Dose/Rate Route Frequency Ordered Stop   09/05/20 0600  ceFAZolin (ANCEF) 2 g in sodium chloride 0.9 % 100 mL IVPB        2 g 200 mL/hr over 30 Minutes Intravenous On call to O.R. 09/04/20 1509 09/04/20 1603   09/04/20 2200  ceFAZolin (ANCEF) 2 g in sodium chloride 0.9 % 100 mL IVPB  Status:  Discontinued        2 g 200 mL/hr  over 30 Minutes Intravenous Every 6 hours 09/04/20 1830 09/04/20 1859   09/04/20 2200  ceFAZolin (ANCEF) 2 g in sodium chloride 0.9 % 100 mL IVPB        2 g 200 mL/hr over 30 Minutes Intravenous Every 6 hours 09/04/20 1859 09/05/20 0525   09/04/20 1525  sodium chloride 0.9 % with ceFAZolin (ANCEF) ADS Med       Note to Pharmacy: Mardelle Matte   : cabinet override      09/04/20 1525 09/04/20 1600          Medications  Scheduled Meds:  Chlorhexidine Gluconate Cloth  6 each Topical Daily   docusate sodium  100 mg Oral BID   enoxaparin (LOVENOX) injection  30 mg Subcutaneous Q24H   escitalopram  10 mg Oral QPM   folic acid  0.5 mg Oral Daily   And   vitamin B-12  500 mcg Oral Daily   gabapentin  100 mg Oral TID   lactose free nutrition  237 mL Oral TID WC   melatonin  10 mg Oral QHS   multivitamin with minerals  1 tablet Oral Daily   Continuous Infusions:  methocarbamol (ROBAXIN) IV 500 mg (09/03/20 1459)   PRN Meds:.acetaminophen, hydrALAZINE, HYDROcodone-acetaminophen, HYDROcodone-acetaminophen, LORazepam, menthol-cetylpyridinium **OR** phenol, methocarbamol  (ROBAXIN) IV, metoCLOPramide **OR** metoCLOPramide (REGLAN) injection, morphine injection, ondansetron **OR** ondansetron (ZOFRAN) IV, oxyCODONE-acetaminophen      Subjective:   Tanya Walker was seen and examined today.  States right hip pain is currently controlled, no acute issues.  No nausea vomiting or abdominal pain.  No acute issues overnight.  Has dementia.  Objective:   Vitals:   09/04/20 1815 09/04/20 1832 09/04/20 2331 09/05/20 0628  BP: (!) 147/85 (!) 151/86 134/68 (!) 164/88  Pulse: 82 85 79 90  Resp: '16 20 16 20  '$ Temp: (!) 97.5 F (36.4 C) (!) 97.5 F (36.4 C) 97.7 F (36.5 C) 98 F (36.7 C)  TempSrc:  Oral Oral Oral  SpO2: 100% 95% 96% 98%  Weight:      Height:        Intake/Output Summary (Last 24 hours) at 09/05/2020 1120 Last data filed at 09/05/2020 0600 Gross per 24 hour  Intake 1950 ml  Output 600 ml  Net 1350 ml     Wt Readings from Last 3 Encounters:  09/03/20 40.8 kg  08/26/20 38.4 kg  08/25/20 38.4 kg   Physical Exam General: Alert and oriented x self and place, has dementia Cardiovascular: S1 S2 clear, RRR. Respiratory: CTAB Gastrointestinal: Soft, nontender, nondistended, NBS Ext: no pedal edema bilaterally Neuro: no acute focal neurological deficits Skin: No rashes Psych: dementia   Data Reviewed:  I have personally reviewed following labs and imaging studies  Micro Results Recent Results (from the past 240 hour(s))  SARS CORONAVIRUS 2 (TAT 6-24 HRS) Nasopharyngeal Nasopharyngeal Swab     Status: None   Collection Time: 09/03/20 10:02 AM   Specimen: Nasopharyngeal Swab  Result Value Ref Range Status   SARS Coronavirus 2 NEGATIVE NEGATIVE Final    Comment: (NOTE) SARS-CoV-2 target nucleic acids are NOT DETECTED.  The SARS-CoV-2 RNA is generally detectable in upper and lower respiratory specimens during the acute phase of infection. Negative results do not preclude SARS-CoV-2 infection, do not rule out co-infections with other  pathogens, and should not be used as the sole basis for treatment or other patient management decisions. Negative results must be combined with clinical observations, patient history, and epidemiological information. The expected result is  Negative.  Fact Sheet for Patients: SugarRoll.be  Fact Sheet for Healthcare Providers: https://www.woods-mathews.com/  This test is not yet approved or cleared by the [place] FDA and  has been authorized for detection and/or diagnosis of SARS-CoV-2 by FDA under an Emergency Use Authorization (EUA). This EUA will remain  in effect (meaning this test can be used) for the duration of the COVID-19 declaration under Se ction 564(b)(1) of the Act, 21 U.S.C. section 360bbb-3(b)(1), unless the authorization is terminated or revoked sooner.  Performed at [facility], [place] [address], [place], [place] [address]   Surgical pcr screen     Status: Abnormal   Collection Time: 09/04/20  1:08 PM   Specimen: Nasal Mucosa; Nasal Swab  Result Value Ref Range Status   MRSA, PCR NEGATIVE NEGATIVE Final   Staphylococcus aureus POSITIVE (A) NEGATIVE Final    Comment: (NOTE) The Xpert SA Assay (FDA approved for NASAL specimens in patients 36 years of age and older), is one component of a comprehensive surveillance program. It is not intended to diagnose infection nor to guide or monitor treatment. Performed at [facility], [place] [address] [place], [place] [address]     Radiology Reports CT Head Wo Contrast  Result Date: 09/03/2020 CLINICAL DATA:  80 year old female status post unwitnessed fall, posterior head injury. EXAM: CT HEAD WITHOUT CONTRAST TECHNIQUE: Contiguous axial images were obtained from the base of the skull through the vertex without intravenous contrast. COMPARISON:  Head CT 08/18/2020. FINDINGS: Brain: Stable cerebral volume. No midline shift, ventriculomegaly, mass effect,  evidence of mass lesion, intracranial hemorrhage or evidence of cortically based acute infarction. Patchy and confluent bilateral white matter hypodensity is stable. Small perivascular space left posterior lentiform, normal variant. Vascular: Calcified atherosclerosis at the skull base. No suspicious intracranial vascular hyperdensity. Skull: Mild motion artifact today.  Stable, no fracture identified. Sinuses/Orbits: Visualized paranasal sinuses and mastoids are stable and well aerated. Other: No discrete scalp or orbits soft tissue injury identified. IMPRESSION: 1. No acute traumatic injury identified. 2. Stable non contrast CT appearance of chronic white matter disease. Electronically Signed   By: Genevie Ann M.D.   On: 09/03/2020 08:26   CT Head Wo Contrast  Result Date: 08/18/2020 CLINICAL DATA:  80 year old female with altered mental status. EXAM: CT HEAD WITHOUT CONTRAST TECHNIQUE: Contiguous axial images were obtained from the base of the skull through the vertex without intravenous contrast. COMPARISON:  None. FINDINGS: Brain: Moderate age-related atrophy and chronic microvascular ischemic changes. 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 There is no acute intracranial hemorrhage. No mass effect or midline shift. No extra-axial fluid collection. Vascular: No hyperdense vessel or unexpected calcification. Skull: Normal. Negative for fracture or focal lesion. Sinuses/Orbits: No acute finding. Other: None IMPRESSION: 1. No acute intracranial pathology. 2. Moderate age-related atrophy and chronic microvascular ischemic changes. Electronically Signed   By: Anner Crete M.D.   On: 08/18/2020 19:59   CT Abdomen Pelvis W Contrast  Result Date: 08/18/2020 CLINICAL DATA:  Altered mental status and lethargy. EXAM: CT ABDOMEN AND PELVIS WITH CONTRAST TECHNIQUE: Multidetector CT imaging of the abdomen and pelvis was performed using the standard protocol following bolus administration of intravenous  contrast. CONTRAST:  75 mL OMNIPAQUE IOHEXOL 350 MG/ML SOLN COMPARISON:  CT thoracic spine 04/16/2020. FINDINGS: Lower chest: There is cardiomegaly. No pleural or pericardial effusion. Lung bases clear. Hepatobiliary: No focal liver abnormality is seen. Status post cholecystectomy. No biliary dilatation. Pancreas: Unremarkable. No pancreatic ductal dilatation or surrounding  inflammatory changes. Spleen: Normal in size. No focal lesion. A few calcifications in the spleen are consistent with old granulomatous disease. Adrenals/Urinary Tract: Adrenal glands are unremarkable. Kidneys are normal, without renal calculi, focal lesion, or hydronephrosis. Bladder is distended but otherwise unremarkable. Stomach/Bowel: Stomach is within normal limits. Status post appendectomy. No evidence of bowel wall thickening, distention, or inflammatory changes. Vascular/Lymphatic: Aortic atherosclerosis. No enlarged abdominal or pelvic lymph nodes. Reproductive: Uterus and bilateral adnexa are unremarkable. Other: None. Musculoskeletal: The patient has T11, T12 and L1 compression fractures. The T11 and L1 fractures are new since the prior thoracic spine CT. Vertebral body height loss at T11 is up to 80% anteriorly and there is approximately 60% vertebral body height loss at L1. Minimal retropulsion off the superior endplate of 624THL. Mild bony retropulsion is seen off the superior endplate of L1. Remote T12 compression fracture where the patient is status post vertebral augmentation noted. No other acute bony abnormality is seen. No lytic or sclerotic lesion. IMPRESSION: T11 and L1 compression fractures are new since the 04/16/2020 thoracic spine CT. Vertebral body height loss is worse at T11 where it is up to approximately 80%. There is mild bony retropulsion off the superior endplate of L1 which appears to cause mild central canal stenosis at T12-L1. Minimal retropulsion of seen off the superior endplate of 624THL Status post vertebral  augmentation at L1 for compression fracture seen on the prior CT. Distended urinary bladder. Cause for this finding is not identified and may be incidental. Cardiomegaly. Aortic Atherosclerosis (ICD10-I70.0). Electronically Signed   By: Inge Rise M.D.   On: 08/18/2020 20:03   Pelvis Portable  Result Date: 09/04/2020 CLINICAL DATA:  Status post right hemiarthroplasty. EXAM: PORTABLE PELVIS 1-2 VIEWS COMPARISON:  Preoperative imaging 09/03/2020 FINDINGS: Right hip hemiarthroplasty in expected alignment. No periprosthetic lucency or fracture. Recent postsurgical change includes air and edema in the soft tissues. Lateral skin staples. IMPRESSION: Right hip hemiarthroplasty without immediate postoperative complication. Electronically Signed   By: Keith Rake M.D.   On: 09/04/2020 19:28   CT Hip Right Wo Contrast  Result Date: 09/03/2020 CLINICAL DATA:  Right hip pain since the patient suffered a fall last night. Initial encounter. EXAM: CT OF THE RIGHT HIP WITHOUT CONTRAST TECHNIQUE: Multidetector CT imaging of the right hip was performed according to the standard protocol. Multiplanar CT image reconstructions were also generated. COMPARISON:  Plain films right hip earlier today. FINDINGS: Bones/Joint/Cartilage The patient has an acute subcapital fracture of the right hip. The fracture is mildly impacted and there is some medial displacement the neck of the femur. No other acute abnormality is seen. No focal lesion. Small right hip joint effusion due to the fracture is noted. Ligaments Suboptimally assessed by CT. Muscles and Tendons Intact. Soft tissues Negative. There is some stranding in subcutaneous fat about the right hip likely due to contusion. IMPRESSION: Acute subcapital fracture right hip as described. Electronically Signed   By: Inge Rise M.D.   On: 09/03/2020 09:22   DG Chest Port 1 View  Result Date: 08/18/2020 CLINICAL DATA:  Diffuse weakness EXAM: PORTABLE CHEST 1 VIEW  COMPARISON:  None. FINDINGS: Cardiac shadow is within normal limits. Lungs are well aerated bilaterally. No focal infiltrate or sizable effusion is noted. Aortic calcifications are seen. No bony abnormality is noted. IMPRESSION: No acute abnormality noted. Electronically Signed   By: Inez Catalina M.D.   On: 08/18/2020 18:07   DG Knee Complete 4 Views Right  Result Date: 09/03/2020 CLINICAL DATA:  Pain status post fall. EXAM: RIGHT KNEE - COMPLETE 4+ VIEW COMPARISON:  None. FINDINGS: No evidence of fracture, dislocation, or joint effusion. No evidence of arthropathy or other focal bone abnormality. Soft tissues are unremarkable. IMPRESSION: No acute fracture or dislocation of the right knee. Electronically Signed   By: Miachel Roux M.D.   On: 09/03/2020 07:53   DG C-Arm 1-60 Min-No Report  Result Date: 09/04/2020 Fluoroscopy was utilized by the requesting physician.  No radiographic interpretation.   DG HIP OPERATIVE UNILAT W OR W/O PELVIS RIGHT  Result Date: 09/04/2020 CLINICAL DATA:  Hip fracture. EXAM: OPERATIVE RIGHT HIP (WITH PELVIS IF PERFORMED) TECHNIQUE: Fluoroscopic spot image(s) were submitted for interpretation post-operatively. COMPARISON:  Preoperative imaging 09/03/2020 FINDINGS: Two fluoroscopic spot views of the right hip obtained in the operating room. Right hip arthroplasty in expected alignment. Fluoroscopy time 10 seconds. IMPRESSION: Procedural fluoroscopy for right hip arthroplasty. Electronically Signed   By: Keith Rake M.D.   On: 09/04/2020 18:03   DG Hip Unilat W or Wo Pelvis 2-3 Views Right  Result Date: 09/03/2020 CLINICAL DATA:  Diffuse right hip pain status post fall EXAM: DG HIP (WITH OR WITHOUT PELVIS) 2-3V RIGHT COMPARISON:  None. FINDINGS: There is subtle lucency in the right femoral neck suspicious for nondisplaced fracture. Evaluation is significantly limited due to patient positioning and overlying structures. IMPRESSION: Subtle lucency in the right femoral  neck is suspicious for nondisplaced fracture. Current images are limited. Further evaluation with CT would be beneficial. Electronically Signed   By: Miachel Roux M.D.   On: 09/03/2020 07:52    Lab Data:  CBC: Recent Labs  Lab 09/03/20 0700 09/03/20 0716 09/04/20 1917 09/05/20 0537  WBC 12.4*  --  10.3 11.8*  NEUTROABS 11.0*  --   --   --   HGB 13.4 13.9 12.8 11.4*  HCT 41.5 41.0 39.5 34.3*  MCV 94.7  --  95.2 93.0  PLT 165  --  144* [id number]*   Basic Metabolic Panel: Recent Labs  Lab 09/03/20 0716 09/04/20 1917 09/05/20 0537  NA 136  --  133*  K 3.7  --  3.2*  CL 100  --  101  CO2  --   --  25  GLUCOSE 86  --  110*  BUN 16  --  23  CREATININE 0.50 0.56 0.59  CALCIUM  --   --  8.6*   GFR: Estimated Creatinine Clearance: 35.5 mL/min (by C-G formula based on SCr of 0.59 mg/dL). Liver Function Tests: No results for input(s): AST, ALT, ALKPHOS, BILITOT, PROT, ALBUMIN in the last 168 hours. No results for input(s): LIPASE, AMYLASE in the last 168 hours. No results for input(s): AMMONIA in the last 168 hours. Coagulation Profile: No results for input(s): INR, PROTIME in the last 168 hours. Cardiac Enzymes: No results for input(s): CKTOTAL, CKMB, CKMBINDEX, TROPONINI in the last 168 hours. BNP (last 3 results) No results for input(s): PROBNP in the last 8760 hours. HbA1C: No results for input(s): HGBA1C in the last 72 hours. CBG: No results for input(s): GLUCAP in the last 168 hours. Lipid Profile: No results for input(s): CHOL, HDL, LDLCALC, TRIG, CHOLHDL, LDLDIRECT in the last 72 hours. Thyroid Function Tests: No results for input(s): TSH, T4TOTAL, FREET4, T3FREE, THYROIDAB in the last 72 hours. Anemia Panel: No results for input(s): VITAMINB12, FOLATE, FERRITIN, TIBC, IRON, RETICCTPCT in the last 72 hours. Urine analysis:    Component Value Date/Time   COLORURINE YELLOW 09/03/2020 [place]  09/03/2020 0831   LABSPEC 1.020 09/03/2020 0831   PHURINE  6.0 09/03/2020 0831   GLUCOSEU NEGATIVE 09/03/2020 0831   GLUCOSEU NEGATIVE 12/05/2016 1056   HGBUR NEGATIVE 09/03/2020 0831   BILIRUBINUR NEGATIVE 09/03/2020 0831   KETONESUR 80 (A) 09/03/2020 0831   PROTEINUR NEGATIVE 09/03/2020 0831   UROBILINOGEN 0.2 12/05/2016 1056   NITRITE NEGATIVE 09/03/2020 0831   LEUKOCYTESUR NEGATIVE 09/03/2020 0831     Keiera Strathman M.D. [facility] 09/05/2020, 11:20 AM  Available via Epic secure chat 7am-7pm After 7 pm, please refer to night coverage provider listed on amion.

## 2020-09-05 NOTE — Progress Notes (Signed)
    Subjective:  Patient reports pain as mild.  Denies N/V/CP/SOB.   Objective:   VITALS:   Vitals:   09/04/20 1815 09/04/20 1832 09/04/20 2331 09/05/20 0628  BP: (!) 147/85 (!) 151/86 134/68 (!) 164/88  Pulse: 82 85 79 90  Resp: '16 20 16 20  '$ Temp: (!) 97.5 F (36.4 C) (!) 97.5 F (36.4 C) 97.7 F (36.5 C) 98 F (36.7 C)  TempSrc:  Oral Oral Oral  SpO2: 100% 95% 96% 98%  Weight:      Height:        NAD ABD soft Neurovascular intact Sensation intact distally Intact pulses distally Dorsiflexion/Plantar flexion intact Incision: dressing C/D/I   Lab Results  Component Value Date   WBC 11.8 (H) 09/05/2020   HGB 11.4 (L) 09/05/2020   HCT 34.3 (L) 09/05/2020   MCV 93.0 09/05/2020   PLT 134 (L) 09/05/2020   BMET    Component Value Date/Time   NA 133 (L) 09/05/2020 0537   K 3.2 (L) 09/05/2020 0537   CL 101 09/05/2020 0537   CO2 25 09/05/2020 0537   GLUCOSE 110 (H) 09/05/2020 0537   BUN 23 09/05/2020 0537   CREATININE 0.59 09/05/2020 0537   CREATININE 0.69 11/01/2019 0848   CALCIUM 8.6 (L) 09/05/2020 0537   CALCIUM 9.7 08/18/2006 2201   GFRNONAA >60 09/05/2020 0537   GFRNONAA 82 11/01/2019 0848   GFRAA 95 11/01/2019 0848     Assessment/Plan: 1 Day Post-Op   Principal Problem:   Closed right hip fracture, initial encounter ([place]) Active Problems:   Alzheimer disease ([place])   WBAT with walker DVT ppx: Lovenox, SCDs, TEDS   ASA '81mg'$  BID at D/C PO pain control PT/OT Dispo: Pending. Follow up with Dr.Swinteck in two weeks for staple removal and radiographs     Tanya Walker 09/05/2020, 7:32 AM  [facility] is now [facility] [address], [place], [place], [place] [address] Phone: [phone] [email or web address] Facebook  [employer]

## 2020-09-05 NOTE — Progress Notes (Signed)
Foley catheter leaking. Removed at this time per MD order . Pt tolerated well.

## 2020-09-06 DIAGNOSIS — G309 Alzheimer's disease, unspecified: Secondary | ICD-10-CM | POA: Diagnosis not present

## 2020-09-06 DIAGNOSIS — F0280 Dementia in other diseases classified elsewhere without behavioral disturbance: Secondary | ICD-10-CM | POA: Diagnosis not present

## 2020-09-06 DIAGNOSIS — S72001A Fracture of unspecified part of neck of right femur, initial encounter for closed fracture: Secondary | ICD-10-CM | POA: Diagnosis not present

## 2020-09-06 LAB — CBC
HCT: 34.1 % — ABNORMAL LOW (ref 36.0–46.0)
Hemoglobin: 11.1 g/dL — ABNORMAL LOW (ref 12.0–15.0)
MCH: 30.3 pg (ref 26.0–34.0)
MCHC: 32.6 g/dL (ref 30.0–36.0)
MCV: 93.2 fL (ref 80.0–100.0)
Platelets: 121 10*3/uL — ABNORMAL LOW (ref 150–400)
RBC: 3.66 MIL/uL — ABNORMAL LOW (ref 3.87–5.11)
RDW: 13.6 % (ref 11.5–15.5)
WBC: 9.6 10*3/uL (ref 4.0–10.5)
nRBC: 0 % (ref 0.0–0.2)

## 2020-09-06 LAB — BASIC METABOLIC PANEL
Anion gap: 6 (ref 5–15)
BUN: 18 mg/dL (ref 8–23)
CO2: 27 mmol/L (ref 22–32)
Calcium: 8.7 mg/dL — ABNORMAL LOW (ref 8.9–10.3)
Chloride: 102 mmol/L (ref 98–111)
Creatinine, Ser: 0.4 mg/dL — ABNORMAL LOW (ref 0.44–1.00)
GFR, Estimated: >60 mL/min (ref >60)
Glucose, Bld: 95 mg/dL (ref 70–99)
Potassium: 3.5 mmol/L (ref 3.5–5.1)
Sodium: 135 mmol/L (ref 135–145)

## 2020-09-06 MED ORDER — AMLODIPINE BESYLATE 5 MG PO TABS
5.0000 mg | ORAL_TABLET | Freq: Every day | ORAL | Status: DC
  Administered 2020-09-06 – 2020-09-07 (×2): 5 mg via ORAL
  Filled 2020-09-06 (×2): qty 1

## 2020-09-06 NOTE — Progress Notes (Addendum)
[facility]                                                                              Patient Demographics  Tanya Walker, is a 80 y.o. female, DOB - 05-28-1940, [id number]  Admit date - 09/03/2020   Admitting Physician Rise Patience, MD  Outpatient Primary MD for the patient is Plotnikov, [name], MD  Outpatient specialists:   LOS - 3  days   Medical records reviewed and are as summarized below:    chief complaint on file: fall       Brief summary   Patient is a 80 year old female with Alzheimer's dementia presented from nursing facility with a fall and right hip pain.  Patient reported that she woke up around 4 AM and was changing when she felt slightly dizzy, fell and hit her head but did not lose consciousness.  After the fall she had right hip pain. CT scan of the right hip confirmed right hip fracture.   Assessment & Plan    Principal Problem: Mechanical fall with closed right hip fracture ([place]) -Status post right hip hemiarthroplasty, postop day #2 -Currently doing well, continue pain control and bowel regimen -PT OT evaluation done, recommended SNF -H&H stable, vitamin D level 35.7  Active Problems:   Alzheimer disease ([place]) -Stable, appears to be at her baseline, continue Ativan, Lexapro   Hypertension -Not on any antihypertensives outpatient, persistent elevated BP readings -Add Norvasc 5 mg daily -Continue hydralazine as needed with parameters    Underweight Estimated body mass index is 15.45 kg/m as calculated from the following:   Height as of this encounter: '5\' 4"'$  (1.626 m).   Weight as of this encounter: 40.8 kg. -Will add nutritional supplements once tolerating diet  Code Status: full code  DVT Prophylaxis:  enoxaparin (LOVENOX) injection 30 mg Start: 09/05/20 0800 SCDs Start: 09/04/20 1831   Level of Care: Level of care: Telemetry Family Communication: Discussed all imaging results, lab results, explained to  the patient's daughter on phone      Disposition Plan:     Status is: Inpatient  Remains inpatient appropriate because:Inpatient level of care appropriate due to severity of illness  Dispo: The patient is from: SNF              Anticipated d/c is to: SNF              Patient currently is not medically stable to d/c.  Awaiting skilled nursing facility   Difficult to place patient No      Time Spent in minutes   25 minutes  Procedures:  Right hip hemiarthroplasty  Consultants:   Orthopedics  Antimicrobials:   Anti-infectives (From admission, onward)    Start     Dose/Rate Route Frequency Ordered Stop   09/05/20 0600  ceFAZolin (ANCEF) 2 g in sodium chloride 0.9 % 100 mL IVPB        2 g 200 mL/hr over 30 Minutes Intravenous On call to O.R. 09/04/20 1509 09/04/20 1603   09/04/20 2200  ceFAZolin (ANCEF) 2 g in sodium chloride 0.9 % 100 mL IVPB  Status:  Discontinued  2 g 200 mL/hr over 30 Minutes Intravenous Every 6 hours 09/04/20 1830 09/04/20 1859   09/04/20 2200  ceFAZolin (ANCEF) 2 g in sodium chloride 0.9 % 100 mL IVPB        2 g 200 mL/hr over 30 Minutes Intravenous Every 6 hours 09/04/20 1859 09/05/20 0525   09/04/20 1525  sodium chloride 0.9 % with ceFAZolin (ANCEF) ADS Med       Note to Pharmacy: Mardelle Matte   : cabinet override      09/04/20 1525 09/04/20 1600          Medications  Scheduled Meds:  Chlorhexidine Gluconate Cloth  6 each Topical Daily   docusate sodium  100 mg Oral BID   enoxaparin (LOVENOX) injection  30 mg Subcutaneous Q24H   escitalopram  10 mg Oral QPM   folic acid  0.5 mg Oral Daily   And   vitamin B-12  500 mcg Oral Daily   gabapentin  100 mg Oral TID   lactose free nutrition  237 mL Oral TID WC   melatonin  10 mg Oral QHS   multivitamin with minerals  1 tablet Oral Daily   senna-docusate  1 tablet Oral BID   Continuous Infusions:  methocarbamol (ROBAXIN) IV 500 mg (09/03/20 1459)   PRN Meds:.acetaminophen,  hydrALAZINE, HYDROcodone-acetaminophen, HYDROcodone-acetaminophen, LORazepam, menthol-cetylpyridinium **OR** phenol, methocarbamol (ROBAXIN) IV, metoCLOPramide **OR** metoCLOPramide (REGLAN) injection, morphine injection, ondansetron **OR** ondansetron (ZOFRAN) IV, oxyCODONE-acetaminophen, polyethylene glycol      Subjective:   Tanya Walker was seen and examined today.  No acute complaints this morning.  BP still somewhat elevated.  Pain in the right hip controlled.  Worked with PT yesterday.  Objective:   Vitals:   09/05/20 0628 09/05/20 1433 09/05/20 2109 09/06/20 0446  BP: (!) 164/88 129/87 (!) 146/78 (!) 152/74  Pulse: 90 80 76 70  Resp: '20 18 20 15  '$ Temp: 98 F (36.7 C) (!) 97.4 F (36.3 C) 97.6 F (36.4 C) 97.8 F (36.6 C)  TempSrc: Oral Oral Oral Oral  SpO2: 98% 98% 94% 95%  Weight:      Height:        Intake/Output Summary (Last 24 hours) at 09/06/2020 1056 Last data filed at 09/06/2020 0900 Gross per 24 hour  Intake 290 ml  Output --  Net 290 ml     Wt Readings from Last 3 Encounters:  09/03/20 40.8 kg  08/26/20 38.4 kg  08/25/20 38.4 kg   Physical Exam General: Alert  NAD Cardiovascular: S1 S2 clear, RRR. No pedal edema b/l Respiratory: CTAB Gastrointestinal: Soft, nontender, nondistended, NBS Ext: no pedal edema bilaterally Neuro: no new deficits Skin: No rashes Psych: Normal affect and demeanor    Data Reviewed:  I have personally reviewed following labs and imaging studies  Micro Results Recent Results (from the past 240 hour(s))  SARS CORONAVIRUS 2 (TAT 6-24 HRS) Nasopharyngeal Nasopharyngeal Swab     Status: None   Collection Time: 09/03/20 10:02 AM   Specimen: Nasopharyngeal Swab  Result Value Ref Range Status   SARS Coronavirus 2 NEGATIVE NEGATIVE Final    Comment: (NOTE) SARS-CoV-2 target nucleic acids are NOT DETECTED.  The SARS-CoV-2 RNA is generally detectable in upper and lower respiratory specimens during the acute phase of  infection. Negative results do not preclude SARS-CoV-2 infection, do not rule out co-infections with other pathogens, and should not be used as the sole basis for treatment or other patient management decisions. Negative results must be combined with clinical observations,  patient history, and epidemiological information. The expected result is Negative.  Fact Sheet for Patients: SugarRoll.be  Fact Sheet for Healthcare Providers: https://www.woods-mathews.com/  This test is not yet approved or cleared by the [place] FDA and  has been authorized for detection and/or diagnosis of SARS-CoV-2 by FDA under an Emergency Use Authorization (EUA). This EUA will remain  in effect (meaning this test can be used) for the duration of the COVID-19 declaration under Se ction 564(b)(1) of the Act, 21 U.S.C. section 360bbb-3(b)(1), unless the authorization is terminated or revoked sooner.  Performed at [facility], [place] [address] [place], [place] [address]   Surgical pcr screen     Status: Abnormal   Collection Time: 09/04/20  1:08 PM   Specimen: Nasal Mucosa; Nasal Swab  Result Value Ref Range Status   MRSA, PCR NEGATIVE NEGATIVE Final   Staphylococcus aureus POSITIVE (A) NEGATIVE Final    Comment: (NOTE) The Xpert SA Assay (FDA approved for NASAL specimens in patients 93 years of age and older), is one component of a comprehensive surveillance program. It is not intended to diagnose infection nor to guide or monitor treatment. Performed at [facility], [place] [address] [place], [place] [address]     Radiology Reports CT Head Wo Contrast  Result Date: 09/03/2020 CLINICAL DATA:  80 year old female status post unwitnessed fall, posterior head injury. EXAM: CT HEAD WITHOUT CONTRAST TECHNIQUE: Contiguous axial images were obtained from the base of the skull through the vertex without intravenous contrast. COMPARISON:   Head CT 08/18/2020. FINDINGS: Brain: Stable cerebral volume. No midline shift, ventriculomegaly, mass effect, evidence of mass lesion, intracranial hemorrhage or evidence of cortically based acute infarction. Patchy and confluent bilateral white matter hypodensity is stable. Small perivascular space left posterior lentiform, normal variant. Vascular: Calcified atherosclerosis at the skull base. No suspicious intracranial vascular hyperdensity. Skull: Mild motion artifact today.  Stable, no fracture identified. Sinuses/Orbits: Visualized paranasal sinuses and mastoids are stable and well aerated. Other: No discrete scalp or orbits soft tissue injury identified. IMPRESSION: 1. No acute traumatic injury identified. 2. Stable non contrast CT appearance of chronic white matter disease. Electronically Signed   By: Genevie Ann M.D.   On: 09/03/2020 08:26   CT Head Wo Contrast  Result Date: 08/18/2020 CLINICAL DATA:  80 year old female with altered mental status. EXAM: CT HEAD WITHOUT CONTRAST TECHNIQUE: Contiguous axial images were obtained from the base of the skull through the vertex without intravenous contrast. COMPARISON:  None. FINDINGS: Brain: Moderate age-related atrophy and chronic microvascular ischemic changes. 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 There is no acute intracranial hemorrhage. No mass effect or midline shift. No extra-axial fluid collection. Vascular: No hyperdense vessel or unexpected calcification. Skull: Normal. Negative for fracture or focal lesion. Sinuses/Orbits: No acute finding. Other: None IMPRESSION: 1. No acute intracranial pathology. 2. Moderate age-related atrophy and chronic microvascular ischemic changes. Electronically Signed   By: Anner Crete M.D.   On: 08/18/2020 19:59   CT Abdomen Pelvis W Contrast  Result Date: 08/18/2020 CLINICAL DATA:  Altered mental status and lethargy. EXAM: CT ABDOMEN AND PELVIS WITH CONTRAST TECHNIQUE: Multidetector CT imaging of  the abdomen and pelvis was performed using the standard protocol following bolus administration of intravenous contrast. CONTRAST:  75 mL OMNIPAQUE IOHEXOL 350 MG/ML SOLN COMPARISON:  CT thoracic spine 04/16/2020. FINDINGS: Lower chest: There is cardiomegaly. No pleural or pericardial effusion. Lung bases clear. Hepatobiliary: No focal liver abnormality is seen. Status post cholecystectomy. No biliary  dilatation. Pancreas: Unremarkable. No pancreatic ductal dilatation or surrounding inflammatory changes. Spleen: Normal in size. No focal lesion. A few calcifications in the spleen are consistent with old granulomatous disease. Adrenals/Urinary Tract: Adrenal glands are unremarkable. Kidneys are normal, without renal calculi, focal lesion, or hydronephrosis. Bladder is distended but otherwise unremarkable. Stomach/Bowel: Stomach is within normal limits. Status post appendectomy. No evidence of bowel wall thickening, distention, or inflammatory changes. Vascular/Lymphatic: Aortic atherosclerosis. No enlarged abdominal or pelvic lymph nodes. Reproductive: Uterus and bilateral adnexa are unremarkable. Other: None. Musculoskeletal: The patient has T11, T12 and L1 compression fractures. The T11 and L1 fractures are new since the prior thoracic spine CT. Vertebral body height loss at T11 is up to 80% anteriorly and there is approximately 60% vertebral body height loss at L1. Minimal retropulsion off the superior endplate of 624THL. Mild bony retropulsion is seen off the superior endplate of L1. Remote T12 compression fracture where the patient is status post vertebral augmentation noted. No other acute bony abnormality is seen. No lytic or sclerotic lesion. IMPRESSION: T11 and L1 compression fractures are new since the 04/16/2020 thoracic spine CT. Vertebral body height loss is worse at T11 where it is up to approximately 80%. There is mild bony retropulsion off the superior endplate of L1 which appears to cause mild central  canal stenosis at T12-L1. Minimal retropulsion of seen off the superior endplate of 624THL Status post vertebral augmentation at L1 for compression fracture seen on the prior CT. Distended urinary bladder. Cause for this finding is not identified and may be incidental. Cardiomegaly. Aortic Atherosclerosis (ICD10-I70.0). Electronically Signed   By: Inge Rise M.D.   On: 08/18/2020 20:03   Pelvis Portable  Result Date: 09/04/2020 CLINICAL DATA:  Status post right hemiarthroplasty. EXAM: PORTABLE PELVIS 1-2 VIEWS COMPARISON:  Preoperative imaging 09/03/2020 FINDINGS: Right hip hemiarthroplasty in expected alignment. No periprosthetic lucency or fracture. Recent postsurgical change includes air and edema in the soft tissues. Lateral skin staples. IMPRESSION: Right hip hemiarthroplasty without immediate postoperative complication. Electronically Signed   By: Keith Rake M.D.   On: 09/04/2020 19:28   CT Hip Right Wo Contrast  Result Date: 09/03/2020 CLINICAL DATA:  Right hip pain since the patient suffered a fall last night. Initial encounter. EXAM: CT OF THE RIGHT HIP WITHOUT CONTRAST TECHNIQUE: Multidetector CT imaging of the right hip was performed according to the standard protocol. Multiplanar CT image reconstructions were also generated. COMPARISON:  Plain films right hip earlier today. FINDINGS: Bones/Joint/Cartilage The patient has an acute subcapital fracture of the right hip. The fracture is mildly impacted and there is some medial displacement the neck of the femur. No other acute abnormality is seen. No focal lesion. Small right hip joint effusion due to the fracture is noted. Ligaments Suboptimally assessed by CT. Muscles and Tendons Intact. Soft tissues Negative. There is some stranding in subcutaneous fat about the right hip likely due to contusion. IMPRESSION: Acute subcapital fracture right hip as described. Electronically Signed   By: Inge Rise M.D.   On: 09/03/2020 09:22   DG  Chest Port 1 View  Result Date: 08/18/2020 CLINICAL DATA:  Diffuse weakness EXAM: PORTABLE CHEST 1 VIEW COMPARISON:  None. FINDINGS: Cardiac shadow is within normal limits. Lungs are well aerated bilaterally. No focal infiltrate or sizable effusion is noted. Aortic calcifications are seen. No bony abnormality is noted. IMPRESSION: No acute abnormality noted. Electronically Signed   By: Inez Catalina M.D.   On: 08/18/2020 18:07   DG Knee Complete  4 Views Right  Result Date: 09/03/2020 CLINICAL DATA:  Pain status post fall. EXAM: RIGHT KNEE - COMPLETE 4+ VIEW COMPARISON:  None. FINDINGS: No evidence of fracture, dislocation, or joint effusion. No evidence of arthropathy or other focal bone abnormality. Soft tissues are unremarkable. IMPRESSION: No acute fracture or dislocation of the right knee. Electronically Signed   By: Miachel Roux M.D.   On: 09/03/2020 07:53   DG C-Arm 1-60 Min-No Report  Result Date: 09/04/2020 Fluoroscopy was utilized by the requesting physician.  No radiographic interpretation.   DG HIP OPERATIVE UNILAT W OR W/O PELVIS RIGHT  Result Date: 09/04/2020 CLINICAL DATA:  Hip fracture. EXAM: OPERATIVE RIGHT HIP (WITH PELVIS IF PERFORMED) TECHNIQUE: Fluoroscopic spot image(s) were submitted for interpretation post-operatively. COMPARISON:  Preoperative imaging 09/03/2020 FINDINGS: Two fluoroscopic spot views of the right hip obtained in the operating room. Right hip arthroplasty in expected alignment. Fluoroscopy time 10 seconds. IMPRESSION: Procedural fluoroscopy for right hip arthroplasty. Electronically Signed   By: Keith Rake M.D.   On: 09/04/2020 18:03   DG Hip Unilat W or Wo Pelvis 2-3 Views Right  Result Date: 09/03/2020 CLINICAL DATA:  Diffuse right hip pain status post fall EXAM: DG HIP (WITH OR WITHOUT PELVIS) 2-3V RIGHT COMPARISON:  None. FINDINGS: There is subtle lucency in the right femoral neck suspicious for nondisplaced fracture. Evaluation is significantly  limited due to patient positioning and overlying structures. IMPRESSION: Subtle lucency in the right femoral neck is suspicious for nondisplaced fracture. Current images are limited. Further evaluation with CT would be beneficial. Electronically Signed   By: Miachel Roux M.D.   On: 09/03/2020 07:52    Lab Data:  CBC: Recent Labs  Lab 09/03/20 0700 09/03/20 0716 09/04/20 1917 09/05/20 0537 09/06/20 0533  WBC 12.4*  --  10.3 11.8* 9.6  NEUTROABS 11.0*  --   --   --   --   HGB 13.4 13.9 12.8 11.4* 11.1*  HCT 41.5 41.0 39.5 34.3* 34.1*  MCV 94.7  --  95.2 93.0 93.2  PLT 165  --  144* 134* [id number]*   Basic Metabolic Panel: Recent Labs  Lab 09/03/20 0716 09/04/20 1917 09/05/20 0537 09/06/20 0533  NA 136  --  133* 135  K 3.7  --  3.2* 3.5  CL 100  --  101 102  CO2  --   --  25 27  GLUCOSE 86  --  110* 95  BUN 16  --  23 18  CREATININE 0.50 0.56 0.59 0.40*  CALCIUM  --   --  8.6* 8.7*   GFR: Estimated Creatinine Clearance: 35.5 mL/min (A) (by C-G formula based on SCr of 0.4 mg/dL (L)). Liver Function Tests: No results for input(s): AST, ALT, ALKPHOS, BILITOT, PROT, ALBUMIN in the last 168 hours. No results for input(s): LIPASE, AMYLASE in the last 168 hours. No results for input(s): AMMONIA in the last 168 hours. Coagulation Profile: No results for input(s): INR, PROTIME in the last 168 hours. Cardiac Enzymes: No results for input(s): CKTOTAL, CKMB, CKMBINDEX, TROPONINI in the last 168 hours. BNP (last 3 results) No results for input(s): PROBNP in the last 8760 hours. HbA1C: No results for input(s): HGBA1C in the last 72 hours. CBG: No results for input(s): GLUCAP in the last 168 hours. Lipid Profile: No results for input(s): CHOL, HDL, LDLCALC, TRIG, CHOLHDL, LDLDIRECT in the last 72 hours. Thyroid Function Tests: No results for input(s): TSH, T4TOTAL, FREET4, T3FREE, THYROIDAB in the last 72 hours. Anemia Panel: No  results for input(s): VITAMINB12, FOLATE, FERRITIN,  TIBC, IRON, RETICCTPCT in the last 72 hours. Urine analysis:    Component Value Date/Time   COLORURINE YELLOW 09/03/2020 0831   APPEARANCEUR CLEAR 09/03/2020 0831   LABSPEC 1.020 09/03/2020 0831   PHURINE 6.0 09/03/2020 0831   GLUCOSEU NEGATIVE 09/03/2020 0831   GLUCOSEU NEGATIVE 12/05/2016 1056   HGBUR NEGATIVE 09/03/2020 0831   BILIRUBINUR NEGATIVE 09/03/2020 0831   KETONESUR 80 (A) 09/03/2020 0831   PROTEINUR NEGATIVE 09/03/2020 0831   UROBILINOGEN 0.2 12/05/2016 1056   NITRITE NEGATIVE 09/03/2020 0831   LEUKOCYTESUR NEGATIVE 09/03/2020 0831     Lanora Reveron M.D. [facility] 09/06/2020, 10:56 AM  Available via Epic secure chat 7am-7pm After 7 pm, please refer to night coverage provider listed on amion.

## 2020-09-07 ENCOUNTER — Encounter (HOSPITAL_COMMUNITY): Payer: Self-pay | Admitting: Orthopedic Surgery

## 2020-09-07 DIAGNOSIS — Z96641 Presence of right artificial hip joint: Secondary | ICD-10-CM | POA: Diagnosis not present

## 2020-09-07 DIAGNOSIS — S72031D Displaced midcervical fracture of right femur, subsequent encounter for closed fracture with routine healing: Secondary | ICD-10-CM | POA: Diagnosis not present

## 2020-09-07 DIAGNOSIS — R29898 Other symptoms and signs involving the musculoskeletal system: Secondary | ICD-10-CM | POA: Diagnosis not present

## 2020-09-07 DIAGNOSIS — D500 Iron deficiency anemia secondary to blood loss (chronic): Secondary | ICD-10-CM | POA: Diagnosis not present

## 2020-09-07 DIAGNOSIS — K5900 Constipation, unspecified: Secondary | ICD-10-CM | POA: Diagnosis not present

## 2020-09-07 DIAGNOSIS — Z8781 Personal history of (healed) traumatic fracture: Secondary | ICD-10-CM | POA: Diagnosis not present

## 2020-09-07 DIAGNOSIS — Z9181 History of falling: Secondary | ICD-10-CM | POA: Diagnosis not present

## 2020-09-07 DIAGNOSIS — E559 Vitamin D deficiency, unspecified: Secondary | ICD-10-CM | POA: Diagnosis not present

## 2020-09-07 DIAGNOSIS — M545 Low back pain, unspecified: Secondary | ICD-10-CM | POA: Diagnosis not present

## 2020-09-07 DIAGNOSIS — E43 Unspecified severe protein-calorie malnutrition: Secondary | ICD-10-CM | POA: Diagnosis not present

## 2020-09-07 DIAGNOSIS — F329 Major depressive disorder, single episode, unspecified: Secondary | ICD-10-CM | POA: Diagnosis not present

## 2020-09-07 DIAGNOSIS — F419 Anxiety disorder, unspecified: Secondary | ICD-10-CM | POA: Diagnosis not present

## 2020-09-07 DIAGNOSIS — J3089 Other allergic rhinitis: Secondary | ICD-10-CM | POA: Diagnosis not present

## 2020-09-07 DIAGNOSIS — R634 Abnormal weight loss: Secondary | ICD-10-CM | POA: Diagnosis not present

## 2020-09-07 DIAGNOSIS — M8000XD Age-related osteoporosis with current pathological fracture, unspecified site, subsequent encounter for fracture with routine healing: Secondary | ICD-10-CM | POA: Diagnosis not present

## 2020-09-07 DIAGNOSIS — G4700 Insomnia, unspecified: Secondary | ICD-10-CM | POA: Diagnosis not present

## 2020-09-07 DIAGNOSIS — M8949 Other hypertrophic osteoarthropathy, multiple sites: Secondary | ICD-10-CM | POA: Diagnosis not present

## 2020-09-07 DIAGNOSIS — E538 Deficiency of other specified B group vitamins: Secondary | ICD-10-CM | POA: Diagnosis not present

## 2020-09-07 DIAGNOSIS — I10 Essential (primary) hypertension: Secondary | ICD-10-CM | POA: Diagnosis not present

## 2020-09-07 DIAGNOSIS — Z7401 Bed confinement status: Secondary | ICD-10-CM | POA: Diagnosis not present

## 2020-09-07 DIAGNOSIS — S72001A Fracture of unspecified part of neck of right femur, initial encounter for closed fracture: Secondary | ICD-10-CM | POA: Diagnosis not present

## 2020-09-07 DIAGNOSIS — G319 Degenerative disease of nervous system, unspecified: Secondary | ICD-10-CM | POA: Diagnosis not present

## 2020-09-07 DIAGNOSIS — M810 Age-related osteoporosis without current pathological fracture: Secondary | ICD-10-CM | POA: Diagnosis not present

## 2020-09-07 DIAGNOSIS — D696 Thrombocytopenia, unspecified: Secondary | ICD-10-CM | POA: Diagnosis not present

## 2020-09-07 DIAGNOSIS — F0390 Unspecified dementia without behavioral disturbance: Secondary | ICD-10-CM | POA: Diagnosis not present

## 2020-09-07 DIAGNOSIS — F339 Major depressive disorder, recurrent, unspecified: Secondary | ICD-10-CM | POA: Diagnosis not present

## 2020-09-07 DIAGNOSIS — G309 Alzheimer's disease, unspecified: Secondary | ICD-10-CM | POA: Diagnosis not present

## 2020-09-07 DIAGNOSIS — E785 Hyperlipidemia, unspecified: Secondary | ICD-10-CM | POA: Diagnosis not present

## 2020-09-07 DIAGNOSIS — R531 Weakness: Secondary | ICD-10-CM | POA: Diagnosis not present

## 2020-09-07 DIAGNOSIS — R4189 Other symptoms and signs involving cognitive functions and awareness: Secondary | ICD-10-CM | POA: Diagnosis not present

## 2020-09-07 DIAGNOSIS — Z9889 Other specified postprocedural states: Secondary | ICD-10-CM | POA: Diagnosis not present

## 2020-09-07 DIAGNOSIS — K5901 Slow transit constipation: Secondary | ICD-10-CM | POA: Diagnosis not present

## 2020-09-07 DIAGNOSIS — R2681 Unsteadiness on feet: Secondary | ICD-10-CM | POA: Diagnosis not present

## 2020-09-07 DIAGNOSIS — R109 Unspecified abdominal pain: Secondary | ICD-10-CM | POA: Diagnosis not present

## 2020-09-07 DIAGNOSIS — M6281 Muscle weakness (generalized): Secondary | ICD-10-CM | POA: Diagnosis not present

## 2020-09-07 DIAGNOSIS — F0280 Dementia in other diseases classified elsewhere without behavioral disturbance: Secondary | ICD-10-CM | POA: Diagnosis not present

## 2020-09-07 DIAGNOSIS — R41841 Cognitive communication deficit: Secondary | ICD-10-CM | POA: Diagnosis not present

## 2020-09-07 DIAGNOSIS — S72011D Unspecified intracapsular fracture of right femur, subsequent encounter for closed fracture with routine healing: Secondary | ICD-10-CM | POA: Diagnosis not present

## 2020-09-07 LAB — CBC
HCT: 30.1 % — ABNORMAL LOW (ref 36.0–46.0)
Hemoglobin: 10.1 g/dL — ABNORMAL LOW (ref 12.0–15.0)
MCH: 30.9 pg (ref 26.0–34.0)
MCHC: 33.6 g/dL (ref 30.0–36.0)
MCV: 92 fL (ref 80.0–100.0)
Platelets: 136 10*3/uL — ABNORMAL LOW (ref 150–400)
RBC: 3.27 MIL/uL — ABNORMAL LOW (ref 3.87–5.11)
RDW: 13.5 % (ref 11.5–15.5)
WBC: 8.1 10*3/uL (ref 4.0–10.5)
nRBC: 0 % (ref 0.0–0.2)

## 2020-09-07 LAB — RESP PANEL BY RT-PCR (FLU A&B, COVID) ARPGX2
Influenza A by PCR: NEGATIVE
Influenza B by PCR: NEGATIVE
SARS Coronavirus 2 by RT PCR: NEGATIVE

## 2020-09-07 MED ORDER — AMLODIPINE BESYLATE 5 MG PO TABS: 5.0000 mg | ORAL_TABLET | Freq: Every day | ORAL | Status: DC

## 2020-09-07 MED ORDER — POLYETHYLENE GLYCOL 3350 17 G PO PACK: 17.0000 g | PACK | Freq: Every day | ORAL | 0 refills | Status: DC | PRN

## 2020-09-07 MED ORDER — LACTULOSE 10 GM/15ML PO SOLN
20.0000 g | Freq: Once | ORAL | Status: AC
  Administered 2020-09-07: 20 g via ORAL
  Filled 2020-09-07: qty 30

## 2020-09-07 NOTE — Progress Notes (Signed)
Physical Therapy Treatment Patient Details Name: Tanya Walker MRN: [id number] DOB: [birth year] Today's Date: 09/07/2020    History of Present Illness 80 yo female s/p R hip hemi-direct anterior 09/04/20. Hx of Alz, compression fractures s/p KP.    PT Comments    Progressing slowly with mobility. Continues to require +2 for safety. Recommend SNF.    Follow Up Recommendations  SNF     Equipment Recommendations       Recommendations for Other Services       Precautions / Restrictions Precautions Precautions: Fall Restrictions Weight Bearing Restrictions: No RLE Weight Bearing: Weight bearing as tolerated    Mobility  Bed Mobility Overal bed mobility: Needs Assistance Bed Mobility: Supine to Sit     Supine to sit: Max assist     General bed mobility comments: Assist for trunk and bil LEs. Utilized bedpad for scooting, positioning. Increased time. Cues required. Pt c/o some dizziness that resolved enough to stand    Transfers Overall transfer level: Needs assistance Equipment used: Rolling walker (2 wheeled) Transfers: Sit to/from Stand Sit to Stand: Mod assist         General transfer comment: Assist to rise, steady, control descent. Cues for safety, technique, hand placement.  Ambulation/Gait Ambulation/Gait assistance: Min assist;+2 safety/equipment Gait Distance (Feet): 5 Feet Assistive device: Rolling walker (2 wheeled) Gait Pattern/deviations: Antalgic;Step-to pattern;Narrow base of support;Trunk flexed     General Gait Details: Assist to stabilize pt and manage RW. Cues for safety, technique, sequence. Followed closely with recliner.   Stairs             Wheelchair Mobility    Modified Rankin (Stroke Patients Only)       Balance Overall balance assessment: Needs assistance;History of Falls           Standing balance-Leahy Scale: Poor                              Cognition Arousal/Alertness: Awake/alert Behavior During  Therapy: WFL for tasks assessed/performed Overall Cognitive Status: History of cognitive impairments - at baseline                                 General Comments: follows 1 step commands fairly well. she is fearful of falling      Exercises      General Comments        Pertinent Vitals/Pain Pain Assessment: Faces Faces Pain Scale: Hurts even more Pain Location: R hip with activity Pain Descriptors / Indicators: Discomfort;Sore;Grimacing;Guarding Pain Intervention(s): Limited activity within patient's tolerance;Monitored during session;Repositioned    Home Living                      Prior Function            PT Goals (current goals can now be found in the care plan section) Progress towards PT goals: Progressing toward goals    Frequency    Min 3X/week      PT Plan Current plan remains appropriate    Co-evaluation              AM-PAC PT "6 Clicks" Mobility   Outcome Measure  Help needed turning from your back to your side while in a flat bed without using bedrails?: A Lot Help needed moving from lying on your back to sitting on the side of a  flat bed without using bedrails?: A Lot Help needed moving to and from a bed to a chair (including a wheelchair)?: A Lot Help needed standing up from a chair using your arms (e.g., wheelchair or bedside chair)?: A Lot Help needed to walk in hospital room?: A Lot Help needed climbing 3-5 steps with a railing? : Total 6 Click Score: 11    End of Session Equipment Utilized During Treatment: Gait belt Activity Tolerance: Patient limited by fatigue;Patient limited by pain Patient left: in chair;with call bell/phone within reach;with chair alarm set   PT Visit Diagnosis: Pain;Other abnormalities of gait and mobility (R26.89) Pain - Right/Left: Right Pain - part of body: Hip     Time: [id number] PT Time Calculation (min) (ACUTE ONLY): 14 min  Charges:  $Gait Training: 8-22 mins                          Doreatha Massed, PT Acute Rehabilitation  Office: [phone] Pager: [phone]

## 2020-09-07 NOTE — Progress Notes (Signed)
Pt to be discharged to [facility]. Report called to this facility.

## 2020-09-07 NOTE — Discharge Summary (Signed)
Physician Discharge Summary   Patient ID: Tanya Walker MRN: [id number] DOB/AGE: May 27, 1940 80 y.o.  Admit date: 09/03/2020 Discharge date: 09/07/2020  Primary Care Physician:  Cassandria Anger, MD   Recommendations for Outpatient Follow-up:  Follow up with PCP in 1-2 weeks 2.  Outpatient follow-up with Dr. Lyla Glassing in 2 weeks for staple removal and x-rays  Home Health: Patient returning to skilled nursing facility for rehab Equipment/Devices:   Discharge Condition: stable  CODE STATUS: FULL  Diet recommendation: Heart healthy diet   Discharge Diagnoses:     Closed right hip fracture, initial encounter ([place])  Alzheimer disease ([place]) Hypertension Underweight Anxiety  Consults: Orthopedics, Dr. Lyla Glassing    Allergies:   Allergies  Allergen Reactions   Alendronate Sodium Other (See Comments)    Leg cramps and "Allergic," per MAR   Risedronate Sodium Other (See Comments)    Caused the patient to be achy and is "Allergic," per MAR   Tramadol Other (See Comments)    Caused the patient to feel badly    Keflex [Cephalexin] Other (See Comments)    Possible rash     DISCHARGE MEDICATIONS: Allergies as of 09/07/2020       Reactions   Alendronate Sodium Other (See Comments)   Leg cramps and "Allergic," per MAR   Risedronate Sodium Other (See Comments)   Caused the patient to be achy and is "Allergic," per MAR   Tramadol Other (See Comments)   Caused the patient to feel badly    Keflex [cephalexin] Other (See Comments)   Possible rash        Medication List     TAKE these medications    acetaminophen 500 MG tablet Commonly known as: TYLENOL Take 1,000 mg by mouth every 6 (six) hours as needed for moderate pain, mild pain, headache or fever.   amLODipine 5 MG tablet Commonly known as: NORVASC Take 1 tablet (5 mg total) by mouth daily. Start taking on: September 08, 2020   aspirin 81 MG chewable tablet Commonly known as: Aspirin Childrens Chew 1 tablet  (81 mg total) by mouth 2 (two) times daily with a meal.   buPROPion 75 MG tablet Commonly known as: WELLBUTRIN Take 75 mg by mouth 2 (two) times daily.   cetirizine 10 MG tablet Commonly known as: ZyrTEC Allergy Take 1 tablet (10 mg total) by mouth daily.   docusate sodium 100 MG capsule Commonly known as: COLACE Take 100 mg by mouth at bedtime.   escitalopram 10 MG tablet Commonly known as: LEXAPRO Take 10 mg by mouth every evening.   gabapentin 100 MG capsule Commonly known as: NEURONTIN Take 1 capsule (100 mg total) by mouth 3 (three) times daily.   HYDROcodone-acetaminophen 5-325 MG tablet Commonly known as: NORCO/VICODIN Take 1-2 tablets by mouth every 4 (four) hours as needed for moderate pain (pain score 4-6).   Melatonin 10 MG Tabs Take 10 mg by mouth at bedtime.   polyethylene glycol 17 g packet Commonly known as: MIRALAX / GLYCOLAX Take 17 g by mouth daily as needed for moderate constipation or mild constipation.   PRESERVISION AREDS 2 PO Take 1 capsule by mouth 2 (two) times daily.   RESOURCE 2.0 PO Take 180 mLs by mouth daily at 2 PM.   Salonpas 3.02-13-08 % Ptch Generic drug: Camphor-Menthol-Methyl Sal Apply 1 patch topically See admin instructions. Apply 1 patch to the lower back and remove (at most) after 8 hours- once a day   VITAMIN B12-FOLIC ACID PO Place  1 lozenge under the tongue in the morning.   Vitamin D3 50 MCG (2000 UT) capsule Take 1 capsule (2,000 Units total) by mouth daily.               Discharge Care Instructions  (From admission, onward)           Start     Ordered   09/07/20 0000  If the dressing is still on your incision site when you go home, remove it on the third day after your surgery date. Remove dressing if it begins to fall off, or if it is dirty or damaged before the third day.        09/07/20 1331             Brief H and P: For complete details please refer to admission H and P, but in brief,  Patient  is a 80 year old female with Alzheimer's dementia presented from nursing facility with a fall and right hip pain.  Patient reported that she woke up around 4 AM and was changing when she felt slightly dizzy, fell and hit her head but did not lose consciousness.  After the fall she had right hip pain. CT scan of the right hip confirmed right hip fracture.    Hospital Course:    Mechanical fall with closed right hip fracture ([place]) -Status post right hip hemiarthroplasty, postop day # 3 -Pain controlled, continue p.o. 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   Weight as of this encounter: 40.8 kg. -Recommend nutritional supplements with meals     Day of Discharge S: No acute complaints, tolerating diet.  Pain is controlled  BP 130/68 (BP Location: Right Arm)   Pulse 79   Temp 97.8 F (36.6 C) (Oral)   Resp 16   Ht '5\' 4"'$  (1.626 m)   Wt 40.8 kg   SpO2 96%   BMI 15.45 kg/m   Physical Exam: General: Alert and awake oriented, self and place, NAD CVS: S1-S2 clear no murmur rubs or gallops Chest: clear to auscultation bilaterally, no wheezing rales or rhonchi Abdomen: soft nontender, nondistended, normal bowel sounds Extremities: no cyanosis, clubbing or edema noted bilaterally Neuro: no new deficits    Get Medicines reviewed and adjusted: Please take all your medications with you for your next visit with your Primary MD  Please request your Primary MD to go over all hospital tests and  procedure/radiological results at the follow up. Please ask your Primary MD to get all Hospital records sent to his/her office.  If you experience worsening of your admission symptoms, develop shortness of breath, life threatening emergency, suicidal or homicidal thoughts you must seek medical attention immediately by calling 911 or calling your MD immediately  if symptoms less severe.  You must read complete instructions/literature along with all the possible adverse reactions/side effects for all the Medicines you take and that have been prescribed to you. Take any new Medicines after  you have completely understood and accept all the possible adverse reactions/side effects.   Do not drive when taking pain medications.   Do not take more than prescribed Pain, Sleep and Anxiety Medications  Special Instructions: If you have smoked or chewed Tobacco  in the last 2 yrs please stop smoking, stop any regular Alcohol  and or any Recreational drug use.  Wear Seat belts while driving.  Please note  You were cared for by a hospitalist during your hospital stay. Once you are discharged, your primary care physician will handle any further medical issues. Please note that NO REFILLS for any discharge medications will be authorized once you are discharged, as it is imperative that you return to your primary care physician (or establish a relationship with a primary care physician if you do not have one) for your aftercare needs so that they can reassess your need for medications and monitor your lab values.   The results of significant diagnostics from this hospitalization (including imaging, microbiology, ancillary and laboratory) are listed below for reference.      Procedures/Studies:  CT Head Wo Contrast  Result Date: 09/03/2020 CLINICAL DATA:  80 year old female status post unwitnessed fall, posterior head injury. EXAM: CT HEAD WITHOUT CONTRAST TECHNIQUE: Contiguous axial images were obtained  from the base of the skull through the vertex without intravenous contrast. COMPARISON:  Head CT 08/18/2020. FINDINGS: Brain: Stable cerebral volume. No midline shift, ventriculomegaly, mass effect, evidence of mass lesion, intracranial hemorrhage or evidence of cortically based acute infarction. Patchy and confluent bilateral white matter hypodensity is stable. Small perivascular space left posterior lentiform, normal variant. Vascular: Calcified atherosclerosis at the skull base. No suspicious intracranial vascular hyperdensity. Skull: Mild motion artifact today.  Stable, no fracture identified. Sinuses/Orbits: Visualized paranasal sinuses and mastoids are stable and well aerated. Other: No discrete scalp or orbits soft tissue injury identified. IMPRESSION: 1. No acute traumatic injury identified. 2. Stable non contrast CT appearance of chronic white matter disease. Electronically Signed   By: Genevie Ann M.D.   On: 09/03/2020 08:26   CT Head Wo Contrast  Result Date: 08/18/2020 CLINICAL DATA:  80 year old female with altered mental status. EXAM: CT HEAD WITHOUT CONTRAST TECHNIQUE: Contiguous axial images were obtained from the base of the skull through the vertex without intravenous contrast. COMPARISON:  None. FINDINGS: Brain: Moderate age-related atrophy and chronic microvascular ischemic changes. 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 There is no acute intracranial hemorrhage. No mass effect or midline shift. No extra-axial fluid collection. Vascular: No hyperdense vessel or unexpected calcification. Skull: Normal. Negative for fracture or focal lesion. Sinuses/Orbits: No acute finding. Other: None IMPRESSION: 1. No acute intracranial pathology. 2. Moderate age-related atrophy and chronic microvascular ischemic changes. Electronically Signed   By: Anner Crete M.D.   On: 08/18/2020 19:59   CT Abdomen Pelvis W Contrast  Result Date: 08/18/2020 CLINICAL DATA:  Altered mental status and  lethargy. EXAM: CT ABDOMEN AND PELVIS WITH CONTRAST TECHNIQUE: Multidetector CT imaging of the abdomen and pelvis was performed using the standard protocol following bolus administration of intravenous contrast. CONTRAST:  75 mL OMNIPAQUE IOHEXOL 350 MG/ML SOLN COMPARISON:  CT thoracic spine 04/16/2020. FINDINGS: Lower chest: There is cardiomegaly. No pleural or pericardial effusion. Lung bases clear. Hepatobiliary: No focal liver abnormality is seen. Status post cholecystectomy. No biliary dilatation. Pancreas: Unremarkable. No pancreatic ductal dilatation or surrounding inflammatory changes. Spleen: Normal in size. No focal lesion. A few calcifications in the spleen are consistent with old granulomatous disease.  Adrenals/Urinary Tract: Adrenal glands are unremarkable. Kidneys are normal, without renal calculi, focal lesion, or hydronephrosis. Bladder is distended but otherwise unremarkable. Stomach/Bowel: Stomach is within normal limits. Status post appendectomy. No evidence of bowel wall thickening, distention, or inflammatory changes. Vascular/Lymphatic: Aortic atherosclerosis. No enlarged abdominal or pelvic lymph nodes. Reproductive: Uterus and bilateral adnexa are unremarkable. Other: None. Musculoskeletal: The patient has T11, T12 and L1 compression fractures. The T11 and L1 fractures are new since the prior thoracic spine CT. Vertebral body height loss at T11 is up to 80% anteriorly and there is approximately 60% vertebral body height loss at L1. Minimal retropulsion off the superior endplate of 624THL. Mild bony retropulsion is seen off the superior endplate of L1. Remote T12 compression fracture where the patient is status post vertebral augmentation noted. No other acute bony abnormality is seen. No lytic or sclerotic lesion. IMPRESSION: T11 and L1 compression fractures are new since the 04/16/2020 thoracic spine CT. Vertebral body height loss is worse at T11 where it is up to approximately 80%. There is  mild bony retropulsion off the superior endplate of L1 which appears to cause mild central canal stenosis at T12-L1. Minimal retropulsion of seen off the superior endplate of 624THL Status post vertebral augmentation at L1 for compression fracture seen on the prior CT. Distended urinary bladder. Cause for this finding is not identified and may be incidental. Cardiomegaly. Aortic Atherosclerosis (ICD10-I70.0). Electronically Signed   By: Inge Rise M.D.   On: 08/18/2020 20:03   Pelvis Portable  Result Date: 09/04/2020 CLINICAL DATA:  Status post right hemiarthroplasty. EXAM: PORTABLE PELVIS 1-2 VIEWS COMPARISON:  Preoperative imaging 09/03/2020 FINDINGS: Right hip hemiarthroplasty in expected alignment. No periprosthetic lucency or fracture. Recent postsurgical change includes air and edema in the soft tissues. Lateral skin staples. IMPRESSION: Right hip hemiarthroplasty without immediate postoperative complication. Electronically Signed   By: Keith Rake M.D.   On: 09/04/2020 19:28   CT Hip Right Wo Contrast  Result Date: 09/03/2020 CLINICAL DATA:  Right hip pain since the patient suffered a fall last night. Initial encounter. EXAM: CT OF THE RIGHT HIP WITHOUT CONTRAST TECHNIQUE: Multidetector CT imaging of the right hip was performed according to the standard protocol. Multiplanar CT image reconstructions were also generated. COMPARISON:  Plain films right hip earlier today. FINDINGS: Bones/Joint/Cartilage The patient has an acute subcapital fracture of the right hip. The fracture is mildly impacted and there is some medial displacement the neck of the femur. No other acute abnormality is seen. No focal lesion. Small right hip joint effusion due to the fracture is noted. Ligaments Suboptimally assessed by CT. Muscles and Tendons Intact. Soft tissues Negative. There is some stranding in subcutaneous fat about the right hip likely due to contusion. IMPRESSION: Acute subcapital fracture right hip as  described. Electronically Signed   By: Inge Rise M.D.   On: 09/03/2020 09:22   DG Chest Port 1 View  Result Date: 08/18/2020 CLINICAL DATA:  Diffuse weakness EXAM: PORTABLE CHEST 1 VIEW COMPARISON:  None. FINDINGS: Cardiac shadow is within normal limits. Lungs are well aerated bilaterally. No focal infiltrate or sizable effusion is noted. Aortic calcifications are seen. No bony abnormality is noted. IMPRESSION: No acute abnormality noted. Electronically Signed   By: Inez Catalina M.D.   On: 08/18/2020 18:07   DG Knee Complete 4 Views Right  Result Date: 09/03/2020 CLINICAL DATA:  Pain status post fall. EXAM: RIGHT KNEE - COMPLETE 4+ VIEW COMPARISON:  None. FINDINGS: No evidence of fracture, dislocation,  or joint effusion. No evidence of arthropathy or other focal bone abnormality. Soft tissues are unremarkable. IMPRESSION: No acute fracture or dislocation of the right knee. Electronically Signed   By: Miachel Roux M.D.   On: 09/03/2020 07:53   DG C-Arm 1-60 Min-No Report  Result Date: 09/04/2020 Fluoroscopy was utilized by the requesting physician.  No radiographic interpretation.   DG HIP OPERATIVE UNILAT W OR W/O PELVIS RIGHT  Result Date: 09/04/2020 CLINICAL DATA:  Hip fracture. EXAM: OPERATIVE RIGHT HIP (WITH PELVIS IF PERFORMED) TECHNIQUE: Fluoroscopic spot image(s) were submitted for interpretation post-operatively. COMPARISON:  Preoperative imaging 09/03/2020 FINDINGS: Two fluoroscopic spot views of the right hip obtained in the operating room. Right hip arthroplasty in expected alignment. Fluoroscopy time 10 seconds. IMPRESSION: Procedural fluoroscopy for right hip arthroplasty. Electronically Signed   By: Keith Rake M.D.   On: 09/04/2020 18:03   DG Hip Unilat W or Wo Pelvis 2-3 Views Right  Result Date: 09/03/2020 CLINICAL DATA:  Diffuse right hip pain status post fall EXAM: DG HIP (WITH OR WITHOUT PELVIS) 2-3V RIGHT COMPARISON:  None. FINDINGS: There is subtle lucency in the  right femoral neck suspicious for nondisplaced fracture. Evaluation is significantly limited due to patient positioning and overlying structures. IMPRESSION: Subtle lucency in the right femoral neck is suspicious for nondisplaced fracture. Current images are limited. Further evaluation with CT would be beneficial. Electronically Signed   By: Miachel Roux M.D.   On: 09/03/2020 07:52      LAB RESULTS: Basic Metabolic Panel: Recent Labs  Lab 09/05/20 0537 09/06/20 0533  NA 133* 135  K 3.2* 3.5  CL 101 102  CO2 25 27  GLUCOSE 110* 95  BUN 23 18  CREATININE 0.59 0.40*  CALCIUM 8.6* 8.7*   Liver Function Tests: No results for input(s): AST, ALT, ALKPHOS, BILITOT, PROT, ALBUMIN in the last 168 hours. No results for input(s): LIPASE, AMYLASE in the last 168 hours. No results for input(s): AMMONIA in the last 168 hours. CBC: Recent Labs  Lab 09/03/20 0700 09/03/20 0716 09/06/20 0533 09/07/20 0400  WBC 12.4*   < > 9.6 8.1  NEUTROABS 11.0*  --   --   --   HGB 13.4   < > 11.1* 10.1*  HCT 41.5   < > 34.1* 30.1*  MCV 94.7   < > 93.2 92.0  PLT 165   < > 121* 136*   < > = values in this interval not displayed.   Cardiac Enzymes: No results for input(s): CKTOTAL, CKMB, CKMBINDEX, TROPONINI in the last 168 hours. BNP: Invalid input(s): POCBNP CBG: No results for input(s): GLUCAP in the last 168 hours.     Disposition and Follow-up: Discharge Instructions     Diet - low sodium heart healthy   Complete by: As directed    If the dressing is still on your incision site when you go home, remove it on the third day after your surgery date. Remove dressing if it begins to fall off, or if it is dirty or damaged before the third day.   Complete by: As directed    Increase activity slowly   Complete by: As directed         DISPOSITION: [facility] information for follow-up providers     Swinteck, Aaron Edelman, MD. Schedule an appointment as  soon as possible for a visit in 2 week(s).   Specialty: Orthopedic Surgery Why: For wound re-check Contact information: [address] [place] [address] [phone]         Plotnikov, [name], MD. Schedule an appointment as soon as possible for a visit in 2 week(s).   Specialty: Internal Medicine Contact information: [place] [address] [phone]              Contact information for after-discharge care     Destination     [facility] .   Service: Skilled Nursing Contact information: [place] [phone]                      Time coordinating discharge:  35 minutes  Signed:   Estill Cotta M.D. [facility] 09/07/2020, 1:45 PM

## 2020-09-07 NOTE — NC FL2 (Signed)
[place] LEVEL OF CARE SCREENING TOOL     IDENTIFICATION  Patient Name: Tanya Walker Birthdate: 02-05-1941 Sex: female Admission Date (Current Location): 09/03/2020  [facility] and [place] Number:  Herbalist and Address:  [facility],  [address] [place], [place]      Provider Number: [id number]  Attending Physician Name and Address:  Mendel Corning, MD  Relative Name and Phone Number:   Tanya Walker (spouse) [phone])    Current Level of Care: Hospital Recommended Level of Care: [place] Prior Approval Number:    Date Approved/Denied:   PASRR Number:  ([id number])  Discharge Plan: SNF    Current Diagnoses: Patient Active Problem List   Diagnosis Date Noted   Closed right hip fracture, initial encounter ([place]) 09/03/2020   Alzheimer disease ([place]) 08/21/2020   Vitamin B12 deficiency 08/21/2020   Hyperlipidemia 08/21/2020   Depression, recurrent ([place]) 08/21/2020   Protein-calorie malnutrition, severe 08/19/2020   Encephalopathy acute 08/19/2020   Altered mental status 08/18/2020   Prolonged QT interval 08/18/2020   Thrombocytopenia ([place]) 08/18/2020   Failure to thrive in adult 08/18/2020   Physical deconditioning 08/18/2020   Compression fracture of body of thoracic vertebra ([place]) 06/08/2020   Low back pain 03/27/2020   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   Pain of left hand 02/14/2019   Tibialis posterior tendinitis, right 07/08/2016   Ankle sprain 06/30/2016   Peripheral vascular disease ([place]) 04/03/2016   Ankle pain, left 03/31/2016   Sore in nose 03/31/2016   Well adult exam 09/25/2015   Adjustment disorder with mixed anxiety and depressed mood 02/03/2015   Allergic rhinitis 05/29/2014   Rash 06/10/2013   URI (upper respiratory infection) 03/08/2012   Lipoma 11/04/2009   PARESTHESIA 12/26/2008   Acute maxillary sinusitis 11/22/2007   Weight loss, abnormal 08/24/2007   Vitamin D deficiency  02/14/2007   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   Osteoporosis 09/01/2006    Orientation RESPIRATION BLADDER Height & Weight     Self  Normal Incontinent Weight: 40.8 kg Height:  '5\' 4"'$  (162.6 cm)  BEHAVIORAL SYMPTOMS/MOOD NEUROLOGICAL BOWEL NUTRITION STATUS      Continent Diet (Regular)  AMBULATORY STATUS COMMUNICATION OF NEEDS Skin   Limited Assist Verbally Normal                       Personal Care Assistance Level of Assistance  Bathing, Feeding, Dressing Bathing Assistance: Limited assistance Feeding assistance: Limited assistance Dressing Assistance: Limited assistance     Functional Limitations Info  Sight, Hearing, Speech Sight Info: Impaired (eyeglasses) Hearing Info: Adequate Speech Info: Adequate    SPECIAL CARE FACTORS FREQUENCY  PT (By licensed PT), OT (By licensed OT)     PT Frequency: 5x week OT Frequency: 5x week            Contractures Contractures Info: Not present    Additional Factors Info  Code Status, Allergies, Psychotropic Code Status Info:  (Full) Allergies Info:  (Alendronate Sodium, Risedronate Sodium, Tramadol, Keflex (Cephalexin) Psychotropic Info:  (Lexapro)         Current Medications (09/07/2020):  This is the current hospital active medication list Current Facility-Administered Medications  Medication Dose Route Frequency Provider Last Rate Last Admin   acetaminophen (TYLENOL) tablet 325-650 mg  325-650 mg Oral Q6H PRN Rod Can, MD   650 mg at 09/06/20 1644   amLODipine (NORVASC) tablet 5 mg  5 mg Oral Daily Rai,  Ripudeep K, MD   5 mg at 09/07/20 [id number]   Chlorhexidine Gluconate Cloth 2 % PADS 6 each  6 each Topical Daily Rai, Ripudeep K, MD   6 each at 09/07/20 [id number]   docusate sodium (COLACE) capsule 100 mg  100 mg Oral BID Rod Can, MD   100 mg at 09/07/20 0918   enoxaparin (LOVENOX) injection 30 mg  30 mg Subcutaneous Q24H Swinteck, Aaron Edelman, MD   30 mg at 09/07/20 0737   escitalopram (LEXAPRO) tablet 10 mg  10 mg  Oral QPM Swinteck, Aaron Edelman, MD   10 mg at [id number]   folic acid (FOLVITE) tablet 0.5 mg  0.5 mg Oral Daily Swinteck, Aaron Edelman, MD   0.5 mg at 09/07/20 [id number]   And   vitamin B-12 (CYANOCOBALAMIN) tablet 500 mcg  500 mcg Oral Daily Rod Can, MD   500 mcg at 09/07/20 [id number]   gabapentin (NEURONTIN) capsule 100 mg  100 mg Oral TID Rod Can, MD   100 mg at 09/07/20 [id number]   hydrALAZINE (APRESOLINE) injection 10 mg  10 mg Intravenous Q6H PRN Swinteck, Aaron Edelman, MD       HYDROcodone-acetaminophen (NORCO) 7.5-325 MG per tablet 1-2 tablet  1-2 tablet Oral Q4H PRN Swinteck, Aaron Edelman, MD       HYDROcodone-acetaminophen (NORCO/VICODIN) 5-325 MG per tablet 1-2 tablet  1-2 tablet Oral Q4H PRN Rod Can, MD   2 tablet at 09/05/20 1711   lactose free nutrition (BOOST PLUS) liquid 237 mL  237 mL Oral TID WC Swinteck, Aaron Edelman, MD   237 mL at 09/07/20 0737   LORazepam (ATIVAN) injection 0.5 mg  0.5 mg Intravenous Q4H PRN Swinteck, Aaron Edelman, MD       melatonin tablet 10 mg  10 mg Oral QHS Swinteck, Aaron Edelman, MD   10 mg at 09/06/20 2158   menthol-cetylpyridinium (CEPACOL) lozenge 3 mg  1 lozenge Oral PRN Swinteck, Aaron Edelman, MD       Or   phenol (CHLORASEPTIC) mouth spray 1 spray  1 spray Mouth/Throat PRN Swinteck, Aaron Edelman, MD       methocarbamol (ROBAXIN) 500 mg in dextrose 5 % 50 mL IVPB  500 mg Intravenous Q8H PRN Swinteck, Aaron Edelman, MD 100 mL/hr at 09/03/20 1459 500 mg at 09/03/20 1459   metoCLOPramide (REGLAN) tablet 5-10 mg  5-10 mg Oral Q8H PRN Swinteck, Aaron Edelman, MD       Or   metoCLOPramide (REGLAN) injection 5-10 mg  5-10 mg Intravenous Q8H PRN Swinteck, Aaron Edelman, MD       morphine 4 MG/ML injection 0.52-1 mg  0.52-1 mg Intravenous Q2H PRN Swinteck, Aaron Edelman, MD       multivitamin with minerals tablet 1 tablet  1 tablet Oral Daily Swinteck, Aaron Edelman, MD   1 tablet at 09/07/20 0918   ondansetron (ZOFRAN) tablet 4 mg  4 mg Oral Q6H PRN Swinteck, Aaron Edelman, MD       Or   ondansetron (ZOFRAN) injection 4 mg  4 mg Intravenous Q6H PRN  Swinteck, Aaron Edelman, MD       oxyCODONE-acetaminophen (PERCOCET/ROXICET) 5-325 MG per tablet 1 tablet  1 tablet Oral Q6H PRN Swinteck, Aaron Edelman, MD       polyethylene glycol (MIRALAX / GLYCOLAX) packet 17 g  17 g Oral Daily PRN Rai, Ripudeep K, MD   17 g at 09/06/20 0842   senna-docusate (Senokot-S) tablet 1 tablet  1 tablet Oral BID Rai, Ripudeep Raliegh Ip, MD   1 tablet at 09/07/20 [id number]     Discharge Medications: Please see discharge summary for  a list of discharge medications.  Relevant Imaging Results:  Relevant Lab Results:   Additional Information SS# [place]  Modesty Rudy, Juliann Pulse, [place]

## 2020-09-07 NOTE — TOC Transition Note (Addendum)
Transition of Care [facility]) - CM/SW Discharge Note   Patient Details  Name: GIANI SCHABEN MRN: [id number] Date of Birth: 18-Feb-1940  Transition of Care [facility]) CM/SW Contact:  Dessa Phi, RN Phone Number: 09/07/2020, 12:45 PM   Clinical Narrative: Awaiting d/c summary, , & signed fl2 to send to [facility] for review,prior PTAR.covid results back neg. 2p-covid neg,d/c summary sent to [place] rep Katie accepted.going to [place] rm30,nsg call report tel#[phone]. PTAR called.No further CM needs.     Final next level of care: [place] Barriers to Discharge: No Barriers Identified   Patient Goals and CMS Choice Patient states their goals for this hospitalization and ongoing recovery are:: Return back to [place] CMS Medicare.gov Compare Post Acute Care list provided to:: Patient Represenative (must comment) (dtr Sonia Baller [phone]) Choice offered to / list presented to : Adult Children  Discharge Placement                       Discharge Plan and Services   Discharge Planning Services: CM Consult Post Acute Care Choice: Skilled Nursing Facility                               Social Determinants of Health (SDOH) Interventions     Readmission Risk Interventions No flowsheet data found.

## 2020-09-08 ENCOUNTER — Encounter: Payer: Self-pay | Admitting: Internal Medicine

## 2020-09-08 ENCOUNTER — Non-Acute Institutional Stay (SKILLED_NURSING_FACILITY): Payer: Medicare Other | Admitting: Internal Medicine

## 2020-09-08 DIAGNOSIS — Z8781 Personal history of (healed) traumatic fracture: Secondary | ICD-10-CM

## 2020-09-08 DIAGNOSIS — R634 Abnormal weight loss: Secondary | ICD-10-CM

## 2020-09-08 DIAGNOSIS — I10 Essential (primary) hypertension: Secondary | ICD-10-CM

## 2020-09-08 DIAGNOSIS — R4189 Other symptoms and signs involving cognitive functions and awareness: Secondary | ICD-10-CM | POA: Diagnosis not present

## 2020-09-08 DIAGNOSIS — F339 Major depressive disorder, recurrent, unspecified: Secondary | ICD-10-CM | POA: Diagnosis not present

## 2020-09-08 DIAGNOSIS — M8000XD Age-related osteoporosis with current pathological fracture, unspecified site, subsequent encounter for fracture with routine healing: Secondary | ICD-10-CM

## 2020-09-08 DIAGNOSIS — E538 Deficiency of other specified B group vitamins: Secondary | ICD-10-CM | POA: Diagnosis not present

## 2020-09-08 DIAGNOSIS — E559 Vitamin D deficiency, unspecified: Secondary | ICD-10-CM

## 2020-09-08 NOTE — Progress Notes (Signed)
Provider:  Veleta Miners MD Location:   [facility] Room Number: 30 Place of Service:  SNF (31)  PCP: Plotnikov, [name], MD Patient Care Team: Cassandria Anger, MD as PCP - General (Internal Medicine)  Extended Emergency Contact Information Primary Emergency Contact: Burman Blacksmith Address: [address]          [place], [place] [address] [place] Phone: [phone] Relation: Spouse Secondary Emergency Contact: [facility] Phone: [phone] Relation: Daughter  Code Status: Full Code Goals of Care: Advanced Directive information Advanced Directives 09/08/2020  Does Patient Have a Medical Advance Directive? Yes  Type of Paramedic of [place];Living will  Does patient want to make changes to medical advance directive? No - Patient declined  Copy of [place] in Chart? No - copy requested  Would patient like information on creating a medical advance directive? -      Chief Complaint  Patient presents with   New Admit To SNF    Admission to SNF    HPI: Patient is a 80 y.o. female seen today for admission to SNF   Patient admitted in the hospital from 7/28-8/1 for closed right hip fracture S/p right hip hemiarthroplasty  Patient has h/o Osteoporosis with Multiple Compression Fracture on Evenity per Dr [name] H/o Weight loss ? Depression Has lost 20 lbs in past 3 months Was also admitted in the hospital from 7/12-7/14 for Change in Mental status most likely due to Remeron has CT scan showed moderate atrophy with chronic microvascular ischemia Was started on Wellbutrin for her depression  Patient fell in her room.  Was sent to the hospital was found to have right hip fracture Had surgery on 7/29 Was also started on Norvasc for hypertension Patient very anxious today States her pain has pain everywhere  does complain of constipation very anxious.  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   Depression    years ago   Headache    sinus headaches   Lipoma of arm    Right   Medical history non-contributory    Osteoporosis    SVD (spontaneous vaginal delivery)    x 2   Varicose vein    Vitamin D deficiency    Past Surgical History:  Procedure Laterality Date   ANTERIOR APPROACH HEMI HIP ARTHROPLASTY Right 09/04/2020   Procedure: ANTERIOR APPROACH HEMI HIP ARTHROPLASTY;  Surgeon: Rod Can, MD;  Location: [facility];  Service: Orthopedics;  Laterality: Right;   [place]   COLONOSCOPY     GANGLION CYST EXCISION     x2 left and right arm   HYSTEROSCOPY WITH D & C N/A 07/15/2013   Procedure: DILATATION AND CURETTAGE /HYSTEROSCOPY;  Surgeon: Lovenia Kim, MD;  Location: [facility];  Service: Gynecology;  Laterality: N/A;   KYPHOPLASTY Bilateral 05/04/2020   Procedure: KYPHOPLASTY THORACIC TWELVE;  Surgeon: Vallarie Mare, MD;  Location: [facility];  Service: Neurosurgery;  Laterality: Bilateral;   MANDIBLE SURGERY  1954   WISDOM TOOTH EXTRACTION      reports that she has never smoked. She has never used smokeless tobacco. She reports that she does not drink alcohol and does not use drugs. Social History   Socioeconomic History   Marital status: Married  Spouse name: Not on file   Number of children: 1   Years of education: Not on file   Highest education level: Not on file  Occupational History   Occupation: retired  Tobacco Use   Smoking status: Never   Smokeless tobacco: Never  Vaping Use   Vaping Use: Never used  Substance and Sexual Activity   Alcohol use: No   Drug use: No   Sexual activity: Yes    Birth control/protection: Post-menopausal  Other Topics Concern   Not on file  Social History Narrative   GI in [place] for colonoscopy   Dr Ronita Hipps - GYN      Denies Surgical history      Family  history of varicose veins   Mother is 50      Regular exercise - NO   Social Determinants of Health   Financial Resource Strain: Low Risk    Difficulty of Paying Living Expenses: Not hard at all  Food Insecurity: No Food Insecurity   Worried About Charity fundraiser in the Last Year: Never true   [place] in the Last Year: Never true  Transportation Needs: No Transportation Needs   Lack of Transportation (Medical): No   Lack of Transportation (Non-Medical): No  Physical Activity: Sufficiently Active   Days of Exercise per Week: 5 days   Minutes of Exercise per Session: 30 min  Stress: Stress Concern Present   Feeling of Stress : Rather much  Social Connections: Moderately Integrated   Frequency of Communication with Friends and Family: More than three times a week   Frequency of Social Gatherings with Friends and Family: Never   Attends Religious Services: Never   Marine scientist or Organizations: Yes   Attends Archivist Meetings: Never   Marital Status: Married  Human resources officer Violence: Not At Risk   Fear of Current or Ex-Partner: No   Emotionally Abused: No   Physically Abused: No   Sexually Abused: No    Functional Status Survey:    Family History  Problem Relation Age of Onset   Hypertension Mother    Heart disease Father    Cancer Father     Health Maintenance  Topic Date Due   COVID-19 Vaccine (4 - Booster) 01/08/2020   INFLUENZA VACCINE  09/07/2020   TETANUS/TDAP  12/25/2022   DEXA SCAN  Completed   PNA vac Low Risk Adult  Completed   Zoster Vaccines- Shingrix  Completed   HPV VACCINES  Aged Out    Allergies  Allergen Reactions   Alendronate Sodium Other (See Comments)    Leg cramps and "Allergic," per MAR   Risedronate Sodium Other (See Comments)    Caused the patient to be achy and is "Allergic," per MAR   Tramadol Other (See Comments)    Caused the patient to feel badly    Keflex [Cephalexin] Other (See Comments)     Possible rash    Allergies as of 09/08/2020       Reactions   Alendronate Sodium Other (See Comments)   Leg cramps and "Allergic," per MAR   Risedronate Sodium Other (See Comments)   Caused the patient to be achy and is "Allergic," per MAR   Tramadol Other (See Comments)   Caused the patient to feel badly    Keflex [cephalexin] Other (See Comments)   Possible rash        Medication List        Accurate as of  September 08, 2020  9:45 AM. If you have any questions, ask your nurse or doctor.          acetaminophen 500 MG tablet Commonly known as: TYLENOL Take 1,000 mg by mouth every 6 (six) hours as needed for moderate pain, mild pain, headache or fever.   amLODipine 5 MG tablet Commonly known as: NORVASC Take 1 tablet (5 mg total) by mouth daily.   aspirin 81 MG chewable tablet Commonly known as: Aspirin Childrens Chew 1 tablet (81 mg total) by mouth 2 (two) times daily with a meal.   buPROPion 75 MG tablet Commonly known as: WELLBUTRIN Take 75 mg by mouth 2 (two) times daily.   cetirizine 10 MG tablet Commonly known as: ZyrTEC Allergy Take 1 tablet (10 mg total) by mouth daily.   docusate sodium 100 MG capsule Commonly known as: COLACE Take 100 mg by mouth at bedtime.   escitalopram 10 MG tablet Commonly known as: LEXAPRO Take 10 mg by mouth every evening.   gabapentin 100 MG capsule Commonly known as: NEURONTIN Take 1 capsule (100 mg total) by mouth 3 (three) times daily.   HYDROcodone-acetaminophen 5-325 MG tablet Commonly known as: NORCO/VICODIN Take 1-2 tablets by mouth every 4 (four) hours as needed for moderate pain (pain score 4-6).   Melatonin 10 MG Tabs Take 10 mg by mouth at bedtime.   polyethylene glycol 17 g packet Commonly known as: MIRALAX / GLYCOLAX Take 17 g by mouth daily as needed for moderate constipation or mild constipation.   PRESERVISION AREDS 2 PO Take 1 capsule by mouth 2 (two) times daily.   RESOURCE 2.0 PO Take 180 mLs by  mouth daily at 2 PM.   Salonpas 3.02-13-08 % Ptch Generic drug: Camphor-Menthol-Methyl Sal Apply 1 patch topically See admin instructions. Apply 1 patch to the lower back and remove (at most) after 8 hours- once a day   VITAMIN B12-FOLIC ACID PO Place 1 lozenge under the tongue in the morning.   Vitamin D3 50 MCG (2000 UT) capsule Take 1 capsule (2,000 Units total) by mouth daily.        Review of Systems  Constitutional:  Positive for activity change and appetite change.  HENT: Negative.    Respiratory: Negative.    Cardiovascular: Negative.   Gastrointestinal:  Positive for constipation.  Genitourinary: Negative.   Musculoskeletal:  Positive for arthralgias, gait problem and myalgias.  Skin: Negative.   Neurological:  Positive for weakness.  Psychiatric/Behavioral:  Positive for confusion and dysphoric mood. The patient is nervous/anxious.    Vitals:   09/08/20 0931  BP: (!) 116/54  Pulse: 80  Resp: 16  Temp: 97.8 F (36.6 C)  SpO2: 93%  Weight: 82 lb 14.4 oz (37.6 kg)  Height: '5\' 4"'$  (1.626 m)   Body mass index is 14.23 kg/m. Physical Exam Constitutional: Oriented to person, place, and time. Very Thin and Frail HENT:  Head: Normocephalic.  Mouth/Throat: Oropharynx is clear and moist.  Eyes: Pupils are equal, round, and reactive to light.  Neck: Neck supple.  Cardiovascular: Normal rate and normal heart sounds.  No murmur heard. Pulmonary/Chest: Effort normal and breath sounds normal. No respiratory distress. No wheezes. She has no rales.  Abdominal: Soft. Bowel sounds are normal. No distension. There is no tenderness. There is no rebound.  Musculoskeletal: No edema.  Lymphadenopathy: none Neurological: Alert and oriented to person, place, and time.  Skin: Skin is warm and dry.  Psychiatric: Normal mood and affect. Behavior is normal. Thought  content normal.   Labs reviewed: Basic Metabolic Panel: Recent Labs    08/18/20 2330 08/19/20 0429 09/03/20 0716  09/04/20 1917 09/05/20 0537 09/06/20 0533  NA  --  137 136  --  133* 135  K  --  3.6 3.7  --  3.2* 3.5  CL  --  104 100  --  101 102  CO2  --  24  --   --  25 27  GLUCOSE  --  80 86  --  110* 95  BUN  --  10 16  --  23 18  CREATININE  --  <0.30* 0.50 0.56 0.59 0.40*  CALCIUM  --  8.6*  --   --  8.6* 8.7*  MG 2.2 2.1  --   --   --   --   PHOS  --  3.1  --   --   --   --    Liver Function Tests: Recent Labs    07/09/20 1830 08/18/20 1622 08/19/20 0429  AST '18 25 22  '$ ALT '15 18 18  '$ ALKPHOS 73 91 86  BILITOT 0.7 0.9 1.0  PROT 5.5* 6.3* 6.4*  ALBUMIN 3.1* 3.7 3.9   Recent Labs    08/18/20 1622  LIPASE 34   No results for input(s): AMMONIA in the last 8760 hours. CBC: Recent Labs    07/09/20 1830 08/18/20 1622 08/19/20 0429 09/03/20 0700 09/03/20 0716 09/05/20 0537 09/06/20 0533 09/07/20 0400  WBC 5.3 5.6   < > 12.4*   < > 11.8* 9.6 8.1  NEUTROABS 3.2 3.1  --  11.0*  --   --   --   --   HGB 12.8 13.1   < > 13.4   < > 11.4* 11.1* 10.1*  HCT 39.9 40.8   < > 41.5   < > 34.3* 34.1* 30.1*  MCV 95.7 94.9   < > 94.7   < > 93.0 93.2 92.0  PLT 127* 135*   < > 165   < > 134* 121* 136*   < > = values in this interval not displayed.   Cardiac Enzymes: No results for input(s): CKTOTAL, CKMB, CKMBINDEX, TROPONINI in the last 8760 hours. BNP: Invalid input(s): POCBNP No results found for: HGBA1C Lab Results  Component Value Date   TSH 2.12 05/21/2020   Lab Results  Component Value Date   [id number] 05/21/2020   No results found for: FOLATE No results found for: IRON, TIBC, FERRITIN  Imaging and Procedures obtained prior to SNF admission: CT Head Wo Contrast  Result Date: 09/03/2020 CLINICAL DATA:  80 year old female status post unwitnessed fall, posterior head injury. EXAM: CT HEAD WITHOUT CONTRAST TECHNIQUE: Contiguous axial images were obtained from the base of the skull through the vertex without intravenous contrast. COMPARISON:  Head CT 08/18/2020. FINDINGS:  Brain: Stable cerebral volume. No midline shift, ventriculomegaly, mass effect, evidence of mass lesion, intracranial hemorrhage or evidence of cortically based acute infarction. Patchy and confluent bilateral white matter hypodensity is stable. Small perivascular space left posterior lentiform, normal variant. Vascular: Calcified atherosclerosis at the skull base. No suspicious intracranial vascular hyperdensity. Skull: Mild motion artifact today.  Stable, no fracture identified. Sinuses/Orbits: Visualized paranasal sinuses and mastoids are stable and well aerated. Other: No discrete scalp or orbits soft tissue injury identified. IMPRESSION: 1. No acute traumatic injury identified. 2. Stable non contrast CT appearance of chronic white matter disease. Electronically Signed   By: Genevie Ann M.D.   On: 09/03/2020 08:26  Pelvis Portable  Result Date: 09/04/2020 CLINICAL DATA:  Status post right hemiarthroplasty. EXAM: PORTABLE PELVIS 1-2 VIEWS COMPARISON:  Preoperative imaging 09/03/2020 FINDINGS: Right hip hemiarthroplasty in expected alignment. No periprosthetic lucency or fracture. Recent postsurgical change includes air and edema in the soft tissues. Lateral skin staples. IMPRESSION: Right hip hemiarthroplasty without immediate postoperative complication. Electronically Signed   By: Keith Rake M.D.   On: 09/04/2020 19:28   CT Hip Right Wo Contrast  Result Date: 09/03/2020 CLINICAL DATA:  Right hip pain since the patient suffered a fall last night. Initial encounter. EXAM: CT OF THE RIGHT HIP WITHOUT CONTRAST TECHNIQUE: Multidetector CT imaging of the right hip was performed according to the standard protocol. Multiplanar CT image reconstructions were also generated. COMPARISON:  Plain films right hip earlier today. FINDINGS: Bones/Joint/Cartilage The patient has an acute subcapital fracture of the right hip. The fracture is mildly impacted and there is some medial displacement the neck of the femur. No  other acute abnormality is seen. No focal lesion. Small right hip joint effusion due to the fracture is noted. Ligaments Suboptimally assessed by CT. Muscles and Tendons Intact. Soft tissues Negative. There is some stranding in subcutaneous fat about the right hip likely due to contusion. IMPRESSION: Acute subcapital fracture right hip as described. Electronically Signed   By: Inge Rise M.D.   On: 09/03/2020 09:22   DG Knee Complete 4 Views Right  Result Date: 09/03/2020 CLINICAL DATA:  Pain status post fall. EXAM: RIGHT KNEE - COMPLETE 4+ VIEW COMPARISON:  None. FINDINGS: No evidence of fracture, dislocation, or joint effusion. No evidence of arthropathy or other focal bone abnormality. Soft tissues are unremarkable. IMPRESSION: No acute fracture or dislocation of the right knee. Electronically Signed   By: Miachel Roux M.D.   On: 09/03/2020 07:53   DG C-Arm 1-60 Min-No Report  Result Date: 09/04/2020 Fluoroscopy was utilized by the requesting physician.  No radiographic interpretation.   DG HIP OPERATIVE UNILAT W OR W/O PELVIS RIGHT  Result Date: 09/04/2020 CLINICAL DATA:  Hip fracture. EXAM: OPERATIVE RIGHT HIP (WITH PELVIS IF PERFORMED) TECHNIQUE: Fluoroscopic spot image(s) were submitted for interpretation post-operatively. COMPARISON:  Preoperative imaging 09/03/2020 FINDINGS: Two fluoroscopic spot views of the right hip obtained in the operating room. Right hip arthroplasty in expected alignment. Fluoroscopy time 10 seconds. IMPRESSION: Procedural fluoroscopy for right hip arthroplasty. Electronically Signed   By: Keith Rake M.D.   On: 09/04/2020 18:03   DG Hip Unilat W or Wo Pelvis 2-3 Views Right  Result Date: 09/03/2020 CLINICAL DATA:  Diffuse right hip pain status post fall EXAM: DG HIP (WITH OR WITHOUT PELVIS) 2-3V RIGHT COMPARISON:  None. FINDINGS: There is subtle lucency in the right femoral neck suspicious for nondisplaced fracture. Evaluation is significantly limited due  to patient positioning and overlying structures. IMPRESSION: Subtle lucency in the right femoral neck is suspicious for nondisplaced fracture. Current images are limited. Further evaluation with CT would be beneficial. Electronically Signed   By: Miachel Roux M.D.   On: 09/03/2020 07:52    Assessment/Plan S/P right hip fracture Pain Controlled on Norco On Aspirin BID WBAT Start Therapy Hypertension Started on Amlodipine Will monitor closely due to h/o Fall Age-related osteoporosis with current pathological fracture  AS been on Evenity per her PCP Would need Follow up D/w Pharmacy it is going to cost a lot for Patient to get shot in Facility Would need to resume in Office  Weight loss, abnormal Due to Depression On Supplements Failed  Remeron  Depression, recurrent (HCC) On Wellbutrin and Lexapro Will Be seen by Facility Nurse Cognitive impairment Supportive care for now Vitamin D deficiency On Supplements Vitamin B12 deficiency On Supplement Anemia Post op Constipation Senna Plus QHS  Family/ staff Communication:   Labs/tests ordered: CBC,bmp in 2 weeks

## 2020-09-16 ENCOUNTER — Non-Acute Institutional Stay (SKILLED_NURSING_FACILITY): Payer: Medicare Other | Admitting: Nurse Practitioner

## 2020-09-16 ENCOUNTER — Encounter: Payer: Self-pay | Admitting: Nurse Practitioner

## 2020-09-16 DIAGNOSIS — M810 Age-related osteoporosis without current pathological fracture: Secondary | ICD-10-CM

## 2020-09-16 DIAGNOSIS — M545 Low back pain, unspecified: Secondary | ICD-10-CM

## 2020-09-16 DIAGNOSIS — E785 Hyperlipidemia, unspecified: Secondary | ICD-10-CM

## 2020-09-16 DIAGNOSIS — K5901 Slow transit constipation: Secondary | ICD-10-CM | POA: Diagnosis not present

## 2020-09-16 DIAGNOSIS — J3089 Other allergic rhinitis: Secondary | ICD-10-CM | POA: Diagnosis not present

## 2020-09-16 DIAGNOSIS — E559 Vitamin D deficiency, unspecified: Secondary | ICD-10-CM | POA: Diagnosis not present

## 2020-09-16 DIAGNOSIS — F0280 Dementia in other diseases classified elsewhere without behavioral disturbance: Secondary | ICD-10-CM

## 2020-09-16 DIAGNOSIS — D500 Iron deficiency anemia secondary to blood loss (chronic): Secondary | ICD-10-CM | POA: Insufficient documentation

## 2020-09-16 DIAGNOSIS — S72001A Fracture of unspecified part of neck of right femur, initial encounter for closed fracture: Secondary | ICD-10-CM | POA: Diagnosis not present

## 2020-09-16 DIAGNOSIS — E43 Unspecified severe protein-calorie malnutrition: Secondary | ICD-10-CM

## 2020-09-16 DIAGNOSIS — F339 Major depressive disorder, recurrent, unspecified: Secondary | ICD-10-CM

## 2020-09-16 DIAGNOSIS — G309 Alzheimer's disease, unspecified: Secondary | ICD-10-CM

## 2020-09-16 DIAGNOSIS — I10 Essential (primary) hypertension: Secondary | ICD-10-CM | POA: Insufficient documentation

## 2020-09-16 DIAGNOSIS — D696 Thrombocytopenia, unspecified: Secondary | ICD-10-CM

## 2020-09-16 NOTE — Assessment & Plan Note (Signed)
last BM 3-4 days ago, takes Colace, Senokot S, will change MiraLax to qd.

## 2020-09-16 NOTE — Assessment & Plan Note (Addendum)
diet, LDL 118 11/01/19

## 2020-09-16 NOTE — Assessment & Plan Note (Signed)
Chronic back pain, stable, T11, L1 compression fx, takes Tylenol, Gabapentin, Norco

## 2020-09-16 NOTE — Assessment & Plan Note (Signed)
takes Zyrtec. 

## 2020-09-16 NOTE — Assessment & Plan Note (Signed)
Hospitalized 09/03/20-09/07/20 for s/p R hip hemiarthroplasty due to a mechanical fall. Managing pain with Tylenol, Norco, continue therapy.

## 2020-09-16 NOTE — Progress Notes (Signed)
Location:   [facility] Room Number: [phone] Place of Service:  SNF (31) Provider:  Marlean Mortell Otho Darner, NP  Plotnikov, [name], MD  Patient Care Team: Cassandria Anger, MD as PCP - General (Internal Medicine)  Extended Emergency Contact Information Primary Emergency Contact: Nakhia, [place] Address: [address]          [place], [place] [address] [place] Phone: [phone] Relation: Spouse Secondary Emergency Contact: [facility] Phone: [phone] Relation: Daughter  Code Status:  FULL CODE Goals of care: Advanced Directive information Advanced Directives 09/16/2020  Does Patient Have a Medical Advance Directive? No  Type of Advance Directive -  Does patient want to make changes to medical advance directive? No - Patient declined  Copy of [place] in Chart? -  Would patient like information on creating a medical advance directive? -     Chief Complaint  Patient presents with   Acute Visit    Patient presents for low blood pressure    HPI:  Pt is a 80 y.o. female seen today for an acute visit for low Bp measurement. The patient denied headache, chest pain/pressure, palpitation, SOB, nausea, vomiting, or increased fatigue. Bp 92/55, Amlodipine held.   Hospitalized 09/03/20-09/07/20 for s/p R hip hemiarthroplasty due to a mechanical fall  Hospitalized 08/18/20-08/20/20 for AMS/lethargy, didn't tolerate Mirtazapine for appetite. CT head/abd/pelvis showed stable T11, L1 compression fx  OP, on Evenity  Anemia, Hgb 10.1 09/07/20, post op.   HTN, low Bp, on Amlodipine. Bun/creat 18/0.40 09/06/09  Constipation, last BM 3-4 days ago, takes Colace, Senokot S, MiraLax.              Alzheimer's Dementia, Vit B12 499, TSH 2.12 05/21/20             Vit B12 deficiency, Vit B12 level 499 05/21/20, takes Vit B12             Vitamin D deficiency, takes Vit d daily             Allergic rhinitis, takes Zyrtec.              Hyperlipidemia, diet, LDL 118 11/01/19             Depression, takes Escitalopram, Wellbutrin             Chronic back pain, stable, T11, L1 compression fx, takes Tylenol, Gabapentin, Norco             Weight loss/adult failure to thrive, SNF [facility] for safety, care needs.             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   Depression    years ago   Headache    sinus headaches   Lipoma of arm    Right   Medical history non-contributory    Osteoporosis    SVD (spontaneous vaginal delivery)    x 2   Varicose vein    Vitamin D deficiency    Past Surgical History:  Procedure Laterality Date   ANTERIOR APPROACH HEMI HIP ARTHROPLASTY Right 09/04/2020   Procedure: ANTERIOR APPROACH HEMI HIP ARTHROPLASTY;  Surgeon: Rod Can, MD;  Location: [facility];  Service: Orthopedics;  Laterality: Right;   [place]   COLONOSCOPY  GANGLION CYST EXCISION     x2 left and right arm   HYSTEROSCOPY WITH D & C N/A 07/15/2013   Procedure: DILATATION AND CURETTAGE /HYSTEROSCOPY;  Surgeon: Lovenia Kim, MD;  Location: [facility];  Service: Gynecology;  Laterality: N/A;   KYPHOPLASTY Bilateral 05/04/2020   Procedure: KYPHOPLASTY THORACIC TWELVE;  Surgeon: Vallarie Mare, MD;  Location: [facility];  Service: Neurosurgery;  Laterality: Bilateral;   MANDIBLE SURGERY  1954   WISDOM TOOTH EXTRACTION      Allergies  Allergen Reactions   Alendronate Sodium Other (See Comments)    Leg cramps and "Allergic," per MAR   Risedronate Sodium Other (See Comments)    Caused the patient to be achy and is "Allergic," per MAR   Tramadol Other (See Comments)    Caused the patient to feel badly    Keflex [Cephalexin] Other (See Comments)    Possible rash    Allergies as of 09/16/2020       Reactions   Alendronate Sodium Other (See Comments)   Leg cramps and "Allergic," per MAR    Risedronate Sodium Other (See Comments)   Caused the patient to be achy and is "Allergic," per MAR   Tramadol Other (See Comments)   Caused the patient to feel badly    Keflex [cephalexin] Other (See Comments)   Possible rash        Medication List        Accurate as of September 16, 2020 11:59 PM. If you have any questions, ask your nurse or doctor.          acetaminophen 500 MG tablet Commonly known as: TYLENOL Take 1,000 mg by mouth every 6 (six) hours as needed for moderate pain, mild pain, headache or fever.   amLODipine 5 MG tablet Commonly known as: NORVASC Take 1 tablet (5 mg total) by mouth daily.   aspirin 81 MG chewable tablet Commonly known as: Aspirin Childrens Chew 1 tablet (81 mg total) by mouth 2 (two) times daily with a meal.   buPROPion 75 MG tablet Commonly known as: WELLBUTRIN Take 75 mg by mouth 2 (two) times daily.   cetirizine 10 MG tablet Commonly known as: ZyrTEC Allergy Take 1 tablet (10 mg total) by mouth daily.   docusate sodium 100 MG capsule Commonly known as: COLACE Take 100 mg by mouth at bedtime.   escitalopram 10 MG tablet Commonly known as: LEXAPRO Take 10 mg by mouth every evening.   gabapentin 100 MG capsule Commonly known as: NEURONTIN Take 1 capsule (100 mg total) by mouth 3 (three) times daily.   HYDROcodone-acetaminophen 5-325 MG tablet Commonly known as: NORCO/VICODIN Take 1-2 tablets by mouth every 4 (four) hours as needed for moderate pain (pain score 4-6).   Melatonin 10 MG Tabs Take 10 mg by mouth at bedtime.   polyethylene glycol 17 g packet Commonly known as: MIRALAX / GLYCOLAX Take 17 g by mouth daily as needed for moderate constipation or mild constipation.   PRESERVISION AREDS 2 PO Take 1 capsule by mouth 2 (two) times daily.   RESOURCE 2.0 PO Take 180 mLs by mouth daily at 2 PM.   Salonpas 3.02-13-08 % Ptch Generic drug: Camphor-Menthol-Methyl Sal Apply 1 patch topically See admin instructions. Apply  1 patch to the lower back and remove (at most) after 8 hours- once a day   senna-docusate 8.6-50 MG tablet Commonly known as: Senokot-S Take 1 tablet by mouth daily.   VITAMIN B12-FOLIC ACID PO Place 1 lozenge  under the tongue in the morning.   Vitamin D3 50 MCG (2000 UT) capsule Take 1 capsule (2,000 Units total) by mouth daily.        Review of Systems  Constitutional:  Negative for activity change, fatigue and fever.  HENT:  Positive for hearing loss. Negative for congestion and voice change.   Eyes:  Negative for visual disturbance.  Respiratory:  Negative for cough and shortness of breath.   Cardiovascular:  Positive for leg swelling.  Gastrointestinal:  Positive for constipation. Negative for abdominal pain, nausea and vomiting.  Genitourinary:  Negative for dysuria and urgency.  Musculoskeletal:  Positive for arthralgias, back pain and gait problem.       R hip pain  Skin:  Negative for color change.  Neurological:  Negative for speech difficulty, weakness and light-headedness.       Dementia.   Psychiatric/Behavioral:  Positive for confusion. Negative for behavioral problems. The patient is nervous/anxious.    Immunization History  Administered Date(s) Administered   DT (Pediatric) 04/10/2002   Fluad Quad(high Dose 65+) 10/28/2019   Influenza Split 03/08/2011, 11/17/2011   Influenza Whole 01/09/2007, 11/05/2008, 11/04/2009   Influenza, High Dose Seasonal PF 12/07/2015, 09/29/2016, 11/09/2017, 09/27/2018, 10/28/2019   Influenza-Unspecified 10/30/2012, 11/29/2013, 12/02/2014   Moderna Sars-Covid-2 Vaccination 02/08/2019, 03/11/2019   PFIZER(Purple Top)SARS-COV-2 Vaccination 10/09/2019   PPD Test 12/22/2015, 01/05/2016   Pneumococcal Conjugate-13 07/17/2014   Pneumococcal Polysaccharide-23 08/15/2005, 09/25/2015   Tetanus 12/24/2012   Zoster Recombinat (Shingrix) 06/07/2019, 09/06/2019   Zoster, Live 12/05/2013   Pertinent  Health Maintenance Due  Topic Date Due    INFLUENZA VACCINE  09/07/2020   DEXA SCAN  Completed   PNA vac Low Risk Adult  Completed   Fall Risk  10/10/2019 10/08/2018 10/02/2017 09/29/2016 10/08/2015  Falls in the past year? 0 0 No No No  Comment - - - - Emmi Telephone Survey: data to providers prior to load  Number falls in past yr: 0 0 - - -  Injury with Fall? 0 0 - - -  Risk for fall due to : No Fall Risks - - - -  Follow up Falls evaluation completed - - - -   Functional Status Survey:    Vitals:   09/16/20 1433  BP: 108/78  Pulse: 82  Resp: 18  Temp: 97.9 F (36.6 C)  SpO2: 96%  Weight: 84 lb (38.1 kg)  Height: '5\' 4"'$  (1.626 m)   Body mass index is 14.42 kg/m. Physical Exam Vitals and nursing note reviewed.  Constitutional:      Appearance: Normal appearance.  HENT:     Head: Normocephalic.     Nose: Nose normal.     Mouth/Throat:     Mouth: Mucous membranes are moist.  Eyes:     Conjunctiva/sclera: Conjunctivae normal.     Pupils: Pupils are equal, round, and reactive to light.  Cardiovascular:     Rate and Rhythm: Normal rate.     Heart sounds: No murmur heard. Abdominal:     General: Bowel sounds are normal.     Palpations: Abdomen is soft.     Tenderness: There is no abdominal tenderness. There is no right CVA tenderness, left CVA tenderness or guarding.  Musculoskeletal:        General: Tenderness present.     Cervical back: Normal range of motion and neck supple.     Right lower leg: No edema.     Left lower leg: No edema.     Comments:  Right hip s/p ORIF 09/08/20, mild swelling  R leg.   Skin:    General: Skin is warm and dry.  Neurological:     General: No focal deficit present.     Mental Status: She is alert. Mental status is at baseline.     Motor: No weakness.     Coordination: Coordination normal.     Gait: Gait abnormal.     Comments: Oriented to self.   Psychiatric:        Mood and Affect: Mood normal.        Behavior: Behavior normal.    Labs reviewed: Recent Labs     08/18/20 2330 08/19/20 0429 09/03/20 0716 09/04/20 1917 09/05/20 0537 09/06/20 0533  NA  --  137 136  --  133* 135  K  --  3.6 3.7  --  3.2* 3.5  CL  --  104 100  --  101 102  CO2  --  24  --   --  25 27  GLUCOSE  --  80 86  --  110* 95  BUN  --  10 16  --  23 18  CREATININE  --  <0.30* 0.50 0.56 0.59 0.40*  CALCIUM  --  8.6*  --   --  8.6* 8.7*  MG 2.2 2.1  --   --   --   --   PHOS  --  3.1  --   --   --   --    Recent Labs    07/09/20 1830 08/18/20 1622 08/19/20 0429  AST '18 25 22  '$ ALT '15 18 18  '$ ALKPHOS 73 91 86  BILITOT 0.7 0.9 1.0  PROT 5.5* 6.3* 6.4*  ALBUMIN 3.1* 3.7 3.9   Recent Labs    07/09/20 1830 08/18/20 1622 08/19/20 0429 09/03/20 0700 09/03/20 0716 09/05/20 0537 09/06/20 0533 09/07/20 0400  WBC 5.3 5.6   < > 12.4*   < > 11.8* 9.6 8.1  NEUTROABS 3.2 3.1  --  11.0*  --   --   --   --   HGB 12.8 13.1   < > 13.4   < > 11.4* 11.1* 10.1*  HCT 39.9 40.8   < > 41.5   < > 34.3* 34.1* 30.1*  MCV 95.7 94.9   < > 94.7   < > 93.0 93.2 92.0  PLT 127* 135*   < > 165   < > 134* 121* 136*   < > = values in this interval not displayed.   Lab Results  Component Value Date   TSH 2.12 05/21/2020   No results found for: HGBA1C Lab Results  Component Value Date   CHOL 201 (H) 11/01/2019   HDL 69 11/01/2019   LDLCALC 118 (H) 11/01/2019   LDLDIRECT 135.4 12/26/2008   TRIG 52 11/01/2019   CHOLHDL 2.9 11/01/2019    Significant Diagnostic Results in last 30 days:  CT Head Wo Contrast  Result Date: 09/03/2020 CLINICAL DATA:  80 year old female status post unwitnessed fall, posterior head injury. EXAM: CT HEAD WITHOUT CONTRAST TECHNIQUE: Contiguous axial images were obtained from the base of the skull through the vertex without intravenous contrast. COMPARISON:  Head CT 08/18/2020. FINDINGS: Brain: Stable cerebral volume. No midline shift, ventriculomegaly, mass effect, evidence of mass lesion, intracranial hemorrhage or evidence of cortically based acute infarction.  Patchy and confluent bilateral white matter hypodensity is stable. Small perivascular space left posterior lentiform, normal variant. Vascular: Calcified atherosclerosis at the skull base. No  suspicious intracranial vascular hyperdensity. Skull: Mild motion artifact today.  Stable, no fracture identified. Sinuses/Orbits: Visualized paranasal sinuses and mastoids are stable and well aerated. Other: No discrete scalp or orbits soft tissue injury identified. IMPRESSION: 1. No acute traumatic injury identified. 2. Stable non contrast CT appearance of chronic white matter disease. Electronically Signed   By: Genevie Ann M.D.   On: 09/03/2020 08:26   CT Head Wo Contrast  Result Date: 08/18/2020 CLINICAL DATA:  80 year old female with altered mental status. EXAM: CT HEAD WITHOUT CONTRAST TECHNIQUE: Contiguous axial images were obtained from the base of the skull through the vertex without intravenous contrast. COMPARISON:  None. FINDINGS: Brain: Moderate age-related atrophy and chronic microvascular ischemic changes. 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 There is no acute intracranial hemorrhage. No mass effect or midline shift. No extra-axial fluid collection. Vascular: No hyperdense vessel or unexpected calcification. Skull: Normal. Negative for fracture or focal lesion. Sinuses/Orbits: No acute finding. Other: None IMPRESSION: 1. No acute intracranial pathology. 2. Moderate age-related atrophy and chronic microvascular ischemic changes. Electronically Signed   By: Anner Crete M.D.   On: 08/18/2020 19:59   CT Abdomen Pelvis W Contrast  Result Date: 08/18/2020 CLINICAL DATA:  Altered mental status and lethargy. EXAM: CT ABDOMEN AND PELVIS WITH CONTRAST TECHNIQUE: Multidetector CT imaging of the abdomen and pelvis was performed using the standard protocol following bolus administration of intravenous contrast. CONTRAST:  75 mL OMNIPAQUE IOHEXOL 350 MG/ML SOLN COMPARISON:  CT thoracic spine  04/16/2020. FINDINGS: Lower chest: There is cardiomegaly. No pleural or pericardial effusion. Lung bases clear. Hepatobiliary: No focal liver abnormality is seen. Status post cholecystectomy. No biliary dilatation. Pancreas: Unremarkable. No pancreatic ductal dilatation or surrounding inflammatory changes. Spleen: Normal in size. No focal lesion. A few calcifications in the spleen are consistent with old granulomatous disease. Adrenals/Urinary Tract: Adrenal glands are unremarkable. Kidneys are normal, without renal calculi, focal lesion, or hydronephrosis. Bladder is distended but otherwise unremarkable. Stomach/Bowel: Stomach is within normal limits. Status post appendectomy. No evidence of bowel wall thickening, distention, or inflammatory changes. Vascular/Lymphatic: Aortic atherosclerosis. No enlarged abdominal or pelvic lymph nodes. Reproductive: Uterus and bilateral adnexa are unremarkable. Other: None. Musculoskeletal: The patient has T11, T12 and L1 compression fractures. The T11 and L1 fractures are new since the prior thoracic spine CT. Vertebral body height loss at T11 is up to 80% anteriorly and there is approximately 60% vertebral body height loss at L1. Minimal retropulsion off the superior endplate of 624THL. Mild bony retropulsion is seen off the superior endplate of L1. Remote T12 compression fracture where the patient is status post vertebral augmentation noted. No other acute bony abnormality is seen. No lytic or sclerotic lesion. IMPRESSION: T11 and L1 compression fractures are new since the 04/16/2020 thoracic spine CT. Vertebral body height loss is worse at T11 where it is up to approximately 80%. There is mild bony retropulsion off the superior endplate of L1 which appears to cause mild central canal stenosis at T12-L1. Minimal retropulsion of seen off the superior endplate of 624THL Status post vertebral augmentation at L1 for compression fracture seen on the prior CT. Distended urinary bladder.  Cause for this finding is not identified and may be incidental. Cardiomegaly. Aortic Atherosclerosis (ICD10-I70.0). Electronically Signed   By: Inge Rise M.D.   On: 08/18/2020 20:03   Pelvis Portable  Result Date: 09/04/2020 CLINICAL DATA:  Status post right hemiarthroplasty. EXAM: PORTABLE PELVIS 1-2 VIEWS COMPARISON:  Preoperative imaging 09/03/2020 FINDINGS: Right hip hemiarthroplasty in  expected alignment. No periprosthetic lucency or fracture. Recent postsurgical change includes air and edema in the soft tissues. Lateral skin staples. IMPRESSION: Right hip hemiarthroplasty without immediate postoperative complication. Electronically Signed   By: Keith Rake M.D.   On: 09/04/2020 19:28   CT Hip Right Wo Contrast  Result Date: 09/03/2020 CLINICAL DATA:  Right hip pain since the patient suffered a fall last night. Initial encounter. EXAM: CT OF THE RIGHT HIP WITHOUT CONTRAST TECHNIQUE: Multidetector CT imaging of the right hip was performed according to the standard protocol. Multiplanar CT image reconstructions were also generated. COMPARISON:  Plain films right hip earlier today. FINDINGS: Bones/Joint/Cartilage The patient has an acute subcapital fracture of the right hip. The fracture is mildly impacted and there is some medial displacement the neck of the femur. No other acute abnormality is seen. No focal lesion. Small right hip joint effusion due to the fracture is noted. Ligaments Suboptimally assessed by CT. Muscles and Tendons Intact. Soft tissues Negative. There is some stranding in subcutaneous fat about the right hip likely due to contusion. IMPRESSION: Acute subcapital fracture right hip as described. Electronically Signed   By: Inge Rise M.D.   On: 09/03/2020 09:22   DG Chest Port 1 View  Result Date: 08/18/2020 CLINICAL DATA:  Diffuse weakness EXAM: PORTABLE CHEST 1 VIEW COMPARISON:  None. FINDINGS: Cardiac shadow is within normal limits. Lungs are well aerated  bilaterally. No focal infiltrate or sizable effusion is noted. Aortic calcifications are seen. No bony abnormality is noted. IMPRESSION: No acute abnormality noted. Electronically Signed   By: Inez Catalina M.D.   On: 08/18/2020 18:07   DG Knee Complete 4 Views Right  Result Date: 09/03/2020 CLINICAL DATA:  Pain status post fall. EXAM: RIGHT KNEE - COMPLETE 4+ VIEW COMPARISON:  None. FINDINGS: No evidence of fracture, dislocation, or joint effusion. No evidence of arthropathy or other focal bone abnormality. Soft tissues are unremarkable. IMPRESSION: No acute fracture or dislocation of the right knee. Electronically Signed   By: Miachel Roux M.D.   On: 09/03/2020 07:53   DG C-Arm 1-60 Min-No Report  Result Date: 09/04/2020 Fluoroscopy was utilized by the requesting physician.  No radiographic interpretation.   DG HIP OPERATIVE UNILAT W OR W/O PELVIS RIGHT  Result Date: 09/04/2020 CLINICAL DATA:  Hip fracture. EXAM: OPERATIVE RIGHT HIP (WITH PELVIS IF PERFORMED) TECHNIQUE: Fluoroscopic spot image(s) were submitted for interpretation post-operatively. COMPARISON:  Preoperative imaging 09/03/2020 FINDINGS: Two fluoroscopic spot views of the right hip obtained in the operating room. Right hip arthroplasty in expected alignment. Fluoroscopy time 10 seconds. IMPRESSION: Procedural fluoroscopy for right hip arthroplasty. Electronically Signed   By: Keith Rake M.D.   On: 09/04/2020 18:03   DG Hip Unilat W or Wo Pelvis 2-3 Views Right  Result Date: 09/03/2020 CLINICAL DATA:  Diffuse right hip pain status post fall EXAM: DG HIP (WITH OR WITHOUT PELVIS) 2-3V RIGHT COMPARISON:  None. FINDINGS: There is subtle lucency in the right femoral neck suspicious for nondisplaced fracture. Evaluation is significantly limited due to patient positioning and overlying structures. IMPRESSION: Subtle lucency in the right femoral neck is suspicious for nondisplaced fracture. Current images are limited. Further evaluation  with CT would be beneficial. Electronically Signed   By: Miachel Roux M.D.   On: 09/03/2020 07:52    Assessment/Plan HTN (hypertension) low Bp measurement. The patient denied headache, chest pain/pressure, palpitation, SOB, nausea, vomiting, or increased fatigue. Bp 92/55, Amlodipine held.  Will decrease Amlodipine to 2.'5mg'$  qd, observe Bp  Closed right hip fracture, initial encounter [facility]) Hospitalized 09/03/20-09/07/20 for s/p R hip hemiarthroplasty due to a mechanical fall. Managing pain with Tylenol, Norco, continue therapy.   Osteoporosis Multiple fxs, continue evenity.   Blood loss anemia Hgb 10.1 09/07/20, post op.   Slow transit constipation  last BM 3-4 days ago, takes Colace, Senokot S, will change MiraLax to qd.   Alzheimer disease ([place]) No behavioral issues. Vit B12 499, TSH 2.12 05/21/20  Vitamin D deficiency , takes Vit d daily  Allergic rhinitis takes Zyrtec.  Hyperlipidemia diet, LDL 118 11/01/19  Depression, recurrent (HCC) Mild anxious facial looks, takes Escitalopram, Wellbutrin  Low back pain Chronic back pain, stable, T11, L1 compression fx, takes Tylenol, Gabapentin, Norco  Protein-calorie malnutrition, severe Weight loss/adult failure to thrive, SNF [facility] for safety, care needs  Thrombocytopenia ([place]) plt 136 09/07/20    Family/ staff Communication: plan of care reviewed with the patient and charge nurse.   Labs/tests ordered:  none  Time spend 35 minutes.

## 2020-09-16 NOTE — Assessment & Plan Note (Signed)
low Bp measurement. The patient denied headache, chest pain/pressure, palpitation, SOB, nausea, vomiting, or increased fatigue. Bp 92/55, Amlodipine held.  Will decrease Amlodipine to 2.'5mg'$  qd, observe Bp

## 2020-09-16 NOTE — Assessment & Plan Note (Signed)
No behavioral issues. Vit B12 499, TSH 2.12 05/21/20

## 2020-09-16 NOTE — Assessment & Plan Note (Signed)
Weight loss/adult failure to thrive, SNF [facility] for safety, care needs

## 2020-09-16 NOTE — Assessment & Plan Note (Signed)
plt 136 09/07/20

## 2020-09-16 NOTE — Assessment & Plan Note (Signed)
Multiple fxs, continue evenity.

## 2020-09-16 NOTE — Assessment & Plan Note (Signed)
Mild anxious facial looks, takes Escitalopram, Wellbutrin

## 2020-09-16 NOTE — Assessment & Plan Note (Signed)
Hgb 10.1 09/07/20, post op.

## 2020-09-16 NOTE — Assessment & Plan Note (Signed)
,   takes Vit d daily

## 2020-09-17 ENCOUNTER — Encounter: Payer: Self-pay | Admitting: Nurse Practitioner

## 2020-09-18 ENCOUNTER — Encounter: Payer: Self-pay | Admitting: Nurse Practitioner

## 2020-09-18 ENCOUNTER — Non-Acute Institutional Stay (SKILLED_NURSING_FACILITY): Payer: Medicare Other | Admitting: Nurse Practitioner

## 2020-09-18 DIAGNOSIS — F339 Major depressive disorder, recurrent, unspecified: Secondary | ICD-10-CM

## 2020-09-18 DIAGNOSIS — G4700 Insomnia, unspecified: Secondary | ICD-10-CM | POA: Diagnosis not present

## 2020-09-18 DIAGNOSIS — E538 Deficiency of other specified B group vitamins: Secondary | ICD-10-CM | POA: Diagnosis not present

## 2020-09-18 DIAGNOSIS — G309 Alzheimer's disease, unspecified: Secondary | ICD-10-CM | POA: Diagnosis not present

## 2020-09-18 DIAGNOSIS — K5901 Slow transit constipation: Secondary | ICD-10-CM

## 2020-09-18 DIAGNOSIS — I10 Essential (primary) hypertension: Secondary | ICD-10-CM | POA: Diagnosis not present

## 2020-09-18 DIAGNOSIS — J3089 Other allergic rhinitis: Secondary | ICD-10-CM | POA: Diagnosis not present

## 2020-09-18 DIAGNOSIS — F0390 Unspecified dementia without behavioral disturbance: Secondary | ICD-10-CM | POA: Diagnosis not present

## 2020-09-18 DIAGNOSIS — M810 Age-related osteoporosis without current pathological fracture: Secondary | ICD-10-CM

## 2020-09-18 DIAGNOSIS — M159 Polyosteoarthritis, unspecified: Secondary | ICD-10-CM

## 2020-09-18 DIAGNOSIS — E559 Vitamin D deficiency, unspecified: Secondary | ICD-10-CM | POA: Diagnosis not present

## 2020-09-18 DIAGNOSIS — M8949 Other hypertrophic osteoarthropathy, multiple sites: Secondary | ICD-10-CM | POA: Diagnosis not present

## 2020-09-18 DIAGNOSIS — F0280 Dementia in other diseases classified elsewhere without behavioral disturbance: Secondary | ICD-10-CM | POA: Diagnosis not present

## 2020-09-18 DIAGNOSIS — E785 Hyperlipidemia, unspecified: Secondary | ICD-10-CM

## 2020-09-18 DIAGNOSIS — M150 Primary generalized (osteo)arthritis: Secondary | ICD-10-CM

## 2020-09-18 DIAGNOSIS — F329 Major depressive disorder, single episode, unspecified: Secondary | ICD-10-CM | POA: Diagnosis not present

## 2020-09-18 DIAGNOSIS — F419 Anxiety disorder, unspecified: Secondary | ICD-10-CM | POA: Diagnosis not present

## 2020-09-18 DIAGNOSIS — D500 Iron deficiency anemia secondary to blood loss (chronic): Secondary | ICD-10-CM | POA: Diagnosis not present

## 2020-09-18 LAB — BASIC METABOLIC PANEL
BUN: 17 (ref 4–21)
CO2: 30 — AB (ref 13–22)
Chloride: 101 (ref 99–108)
Creatinine: 0.4 — AB (ref 0.5–1.1)
Glucose: 82
Potassium: 4 (ref 3.4–5.3)
Sodium: 138 (ref 137–147)

## 2020-09-18 LAB — COMPREHENSIVE METABOLIC PANEL
Albumin: 3.1 — AB (ref 3.5–5.0)
Calcium: 8.3 — AB (ref 8.7–10.7)
Globulin: 2.2

## 2020-09-18 LAB — CBC AND DIFFERENTIAL
HCT: 34 — AB (ref 36–46)
Hemoglobin: 11.1 — AB (ref 12.0–16.0)
Neutrophils Absolute: 4979
WBC: 6.5

## 2020-09-18 LAB — HEPATIC FUNCTION PANEL
ALT: 22 (ref 7–35)
AST: 21 (ref 13–35)
Alkaline Phosphatase: 100 (ref 25–125)
Bilirubin, Total: 0.4

## 2020-09-18 LAB — CBC: RBC: 3.58 — AB (ref 3.87–5.11)

## 2020-09-18 NOTE — Progress Notes (Signed)
Location:   [place] Room Number: [phone] Place of Service:  SNF (31) Provider:  Leoda Smithhart Otho Darner, NP  Plotnikov, [name], MD  Patient Care Team: Cassandria Anger, MD as PCP - General (Internal Medicine)  Extended Emergency Contact Information Primary Emergency Contact: Murlean, Strite Address: [address]          [place], [place] [address] [place] Phone: [phone] Relation: Spouse Secondary Emergency Contact: [facility] Phone: [phone] Relation: Daughter  Code Status:  FULL CODE Goals of care: Advanced Directive information Advanced Directives 09/18/2020  Does Patient Have a Medical Advance Directive? Yes  Type of Paramedic of [place];Living will  Does patient want to make changes to medical advance directive? No - Patient declined  Copy of [place] in Chart? No - copy requested  Would patient like information on creating a medical advance directive? No - Patient declined     Chief Complaint  Patient presents with   Acute Visit    Patient complains of right hip and side pain.    HPI:  Pt is a 80 y.o. female seen today for an acute visit for reported the patient's c/o right hip pain vs right side pain, the patient stated it felt like it did whne she had appendicitis. The patient is afebrile, denied nausea, vomiting, or diarrhea. Last BM is about 4-5 days ago. Actually her abd pain is LLQ of abd when palpated, negative for Murphy sign, no tenderness, rebound tenderness, or abd guarding.              Hospitalized 09/03/20-09/07/20 for s/p R hip hemiarthroplasty due to a mechanical fall, prn Tylenol, Norco available to her.              Hospitalized 08/18/20-08/20/20 for AMS/lethargy, didn't tolerate Mirtazapine for appetite. CT head/abd/pelvis showed stable T11, L1 compression fx             OP, on Evenity             Anemia, Hgb 10.1 09/07/20, post op.              HTN, on  Amlodipine. Bun/creat 18/0.40 09/06/09             Constipation, takes Colace, Senokot S, MiraLax.              Alzheimer's Dementia, Vit B12 499, TSH 2.12 05/21/20             Vit B12 deficiency, Vit B12 level 499 05/21/20, takes Vit B12             Vitamin D deficiency, takes Vit d daily             Allergic rhinitis, takes Zyrtec.             Hyperlipidemia, diet, LDL 118 11/01/19             Depression, takes Escitalopram, Wellbutrin             Chronic back pain, stable, T11, L1 compression fx, takes Tylenol, Gabapentin, Norco             Weight loss/adult failure to thrive, SNF [facility] for safety, care needs.             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   Depression    years ago   Headache    sinus headaches   Lipoma of arm    Right   Medical history non-contributory    Osteoporosis    SVD (spontaneous vaginal delivery)    x 2   Varicose vein    Vitamin D deficiency    Past Surgical History:  Procedure Laterality Date   ANTERIOR APPROACH HEMI HIP ARTHROPLASTY Right 09/04/2020   Procedure: ANTERIOR APPROACH HEMI HIP ARTHROPLASTY;  Surgeon: Rod Can, MD;  Location: [facility];  Service: Orthopedics;  Laterality: Right;   [place]   COLONOSCOPY     GANGLION CYST EXCISION     x2 left and right arm   HYSTEROSCOPY WITH D & C N/A 07/15/2013   Procedure: DILATATION AND CURETTAGE /HYSTEROSCOPY;  Surgeon: Lovenia Kim, MD;  Location: [facility];  Service: Gynecology;  Laterality: N/A;   KYPHOPLASTY Bilateral 05/04/2020   Procedure: KYPHOPLASTY THORACIC TWELVE;  Surgeon: Vallarie Mare, MD;  Location: [place];  Service: Neurosurgery;  Laterality: Bilateral;   MANDIBLE SURGERY  1954   WISDOM TOOTH EXTRACTION      Allergies  Allergen Reactions   Alendronate Sodium Other (See Comments)    Leg cramps and "Allergic," per MAR   Risedronate  Sodium Other (See Comments)    Caused the patient to be achy and is "Allergic," per MAR   Tramadol Other (See Comments)    Caused the patient to feel badly    Keflex [Cephalexin] Other (See Comments)    Possible rash    Allergies as of 09/18/2020       Reactions   Alendronate Sodium Other (See Comments)   Leg cramps and "Allergic," per MAR   Risedronate Sodium Other (See Comments)   Caused the patient to be achy and is "Allergic," per MAR   Tramadol Other (See Comments)   Caused the patient to feel badly    Keflex [cephalexin] Other (See Comments)   Possible rash        Medication List        Accurate as of September 18, 2020 11:59 PM. If you have any questions, ask your nurse or doctor.          acetaminophen 500 MG tablet Commonly known as: TYLENOL Take 1,000 mg by mouth every 6 (six) hours as needed for moderate pain, mild pain, headache or fever.   amLODipine 2.5 MG tablet Commonly known as: NORVASC Take 2.5 mg by mouth daily. What changed: Another medication with the same name was removed. Continue taking this medication, and follow the directions you see here. Changed by: Eimy Plaza X Quantarius Genrich, NP   aspirin 81 MG chewable tablet Commonly known as: Aspirin Childrens Chew 1 tablet (81 mg total) by mouth 2 (two) times daily with a meal.   buPROPion 75 MG tablet Commonly known as: WELLBUTRIN Take 75 mg by mouth 2 (two) times daily.   cetirizine 10 MG tablet Commonly known as: ZyrTEC Allergy Take 1 tablet (10 mg total) by mouth daily.   docusate sodium 100 MG capsule Commonly known as: COLACE Take 100 mg by mouth at bedtime.   escitalopram 10 MG tablet Commonly known as: LEXAPRO Take 10 mg by mouth every evening.   gabapentin 100 MG capsule Commonly known as: NEURONTIN Take 1 capsule (100 mg total) by mouth 3 (three) times daily.   HYDROcodone-acetaminophen 5-325 MG tablet Commonly known as: NORCO/VICODIN Take  1-2 tablets by mouth every 4 (four) hours as needed  for moderate pain (pain score 4-6).   Melatonin 10 MG Tabs Take 10 mg by mouth at bedtime.   polyethylene glycol 17 g packet Commonly known as: MIRALAX / GLYCOLAX Take 17 g by mouth daily as needed for moderate constipation or mild constipation.   PRESERVISION AREDS 2 PO Take 1 capsule by mouth 2 (two) times daily.   RESOURCE 2.0 PO Take 180 mLs by mouth daily at 2 PM.   Salonpas 3.02-13-08 % Ptch Generic drug: Camphor-Menthol-Methyl Sal Apply 1 patch topically See admin instructions. Apply 1 patch to the lower back and remove (at most) after 8 hours- once a day   senna-docusate 8.6-50 MG tablet Commonly known as: Senokot-S Take 1 tablet by mouth daily.   VITAMIN B12-FOLIC ACID PO Place 1 lozenge under the tongue in the morning.   Vitamin D3 50 MCG (2000 UT) capsule Take 1 capsule (2,000 Units total) by mouth daily.       ROS was provided with assistance of staff.  Review of Systems  Constitutional:  Negative for activity change, fatigue and fever.  HENT:  Positive for hearing loss. Negative for congestion and voice change.   Eyes:  Negative for visual disturbance.  Respiratory:  Negative for cough and shortness of breath.   Cardiovascular:  Positive for leg swelling.  Gastrointestinal:  Positive for abdominal pain and constipation. Negative for abdominal distention, anal bleeding, blood in stool, diarrhea, nausea and vomiting.  Genitourinary:  Negative for dysuria and urgency.  Musculoskeletal:  Positive for arthralgias, back pain and gait problem.       R hip pain  Skin:  Negative for color change.  Neurological:  Negative for speech difficulty, weakness and light-headedness.       Dementia.   Psychiatric/Behavioral:  Positive for confusion. Negative for behavioral problems. The patient is nervous/anxious.    Immunization History  Administered Date(s) Administered   DT (Pediatric) 04/10/2002   Fluad Quad(high Dose 65+) 10/28/2019   Influenza Split 03/08/2011,  11/17/2011   Influenza Whole 01/09/2007, 11/05/2008, 11/04/2009   Influenza, High Dose Seasonal PF 12/07/2015, 09/29/2016, 11/09/2017, 09/27/2018, 10/28/2019   Influenza-Unspecified 10/30/2012, 11/29/2013, 12/02/2014   Moderna Sars-Covid-2 Vaccination 02/08/2019, 03/11/2019   PFIZER(Purple Top)SARS-COV-2 Vaccination 10/09/2019   PPD Test 12/22/2015, 01/05/2016   Pneumococcal Conjugate-13 07/17/2014   Pneumococcal Polysaccharide-23 08/15/2005, 09/25/2015   Tetanus 12/24/2012   Zoster Recombinat (Shingrix) 06/07/2019, 09/06/2019   Zoster, Live 12/05/2013   Pertinent  Health Maintenance Due  Topic Date Due   INFLUENZA VACCINE  09/07/2020   DEXA SCAN  Completed   PNA vac Low Risk Adult  Completed   Fall Risk  10/10/2019 10/08/2018 10/02/2017 09/29/2016 10/08/2015  Falls in the past year? 0 0 No No No  Comment - - - - Emmi Telephone Survey: data to providers prior to load  Number falls in past yr: 0 0 - - -  Injury with Fall? 0 0 - - -  Risk for fall due to : No Fall Risks - - - -  Follow up Falls evaluation completed - - - -   Functional Status Survey:    Vitals:   09/18/20 1437  BP: 108/78  Pulse: 82  Resp: 18  Temp: 98 F (36.7 C)  SpO2: 96%  Weight: 84 lb (38.1 kg)  Height: '5\' 4"'$  (1.626 m)   Body mass index is 14.42 kg/m. Physical Exam Vitals and nursing note reviewed.  Constitutional:  Appearance: Normal appearance.  HENT:     Head: Normocephalic.     Nose: Nose normal.     Mouth/Throat:     Mouth: Mucous membranes are moist.  Eyes:     Conjunctiva/sclera: Conjunctivae normal.     Pupils: Pupils are equal, round, and reactive to light.  Cardiovascular:     Rate and Rhythm: Normal rate.     Heart sounds: No murmur heard. Abdominal:     General: Bowel sounds are normal.     Palpations: Abdomen is soft.     Tenderness: There is abdominal tenderness. There is no right CVA tenderness, left CVA tenderness, guarding or rebound.     Comments: LLQ discomfort/pain  palpated. Negative Murphy's sign, negative McBurney's point tenderness.   Musculoskeletal:        General: Tenderness present.     Cervical back: Normal range of motion and neck supple.     Right lower leg: No edema.     Left lower leg: No edema.     Comments: Right hip s/p ORIF 09/08/20, mild swelling  R leg.   Skin:    General: Skin is warm and dry.  Neurological:     General: No focal deficit present.     Mental Status: She is alert. Mental status is at baseline.     Motor: No weakness.     Coordination: Coordination normal.     Gait: Gait abnormal.     Comments: Oriented to self.   Psychiatric:        Mood and Affect: Mood normal.        Behavior: Behavior normal.    Labs reviewed: Recent Labs    08/18/20 2330 08/19/20 0429 09/03/20 0716 09/04/20 1917 09/05/20 0537 09/06/20 0533  NA  --  137 136  --  133* 135  K  --  3.6 3.7  --  3.2* 3.5  CL  --  104 100  --  101 102  CO2  --  24  --   --  25 27  GLUCOSE  --  80 86  --  110* 95  BUN  --  10 16  --  23 18  CREATININE  --  <0.30* 0.50 0.56 0.59 0.40*  CALCIUM  --  8.6*  --   --  8.6* 8.7*  MG 2.2 2.1  --   --   --   --   PHOS  --  3.1  --   --   --   --    Recent Labs    07/09/20 1830 08/18/20 1622 08/19/20 0429  AST '18 25 22  '$ ALT '15 18 18  '$ ALKPHOS 73 91 86  BILITOT 0.7 0.9 1.0  PROT 5.5* 6.3* 6.4*  ALBUMIN 3.1* 3.7 3.9   Recent Labs    07/09/20 1830 08/18/20 1622 08/19/20 0429 09/03/20 0700 09/03/20 0716 09/05/20 0537 09/06/20 0533 09/07/20 0400  WBC 5.3 5.6   < > 12.4*   < > 11.8* 9.6 8.1  NEUTROABS 3.2 3.1  --  11.0*  --   --   --   --   HGB 12.8 13.1   < > 13.4   < > 11.4* 11.1* 10.1*  HCT 39.9 40.8   < > 41.5   < > 34.3* 34.1* 30.1*  MCV 95.7 94.9   < > 94.7   < > 93.0 93.2 92.0  PLT 127* 135*   < > 165   < > 134* 121* 136*   < > =  values in this interval not displayed.   Lab Results  Component Value Date   TSH 2.12 05/21/2020   No results found for: HGBA1C Lab Results  Component  Value Date   CHOL 201 (H) 11/01/2019   HDL 69 11/01/2019   LDLCALC 118 (H) 11/01/2019   LDLDIRECT 135.4 12/26/2008   TRIG 52 11/01/2019   CHOLHDL 2.9 11/01/2019    Significant Diagnostic Results in last 30 days:  CT Head Wo Contrast  Result Date: 09/03/2020 CLINICAL DATA:  81 year old female status post unwitnessed fall, posterior head injury. EXAM: CT HEAD WITHOUT CONTRAST TECHNIQUE: Contiguous axial images were obtained from the base of the skull through the vertex without intravenous contrast. COMPARISON:  Head CT 08/18/2020. FINDINGS: Brain: Stable cerebral volume. No midline shift, ventriculomegaly, mass effect, evidence of mass lesion, intracranial hemorrhage or evidence of cortically based acute infarction. Patchy and confluent bilateral white matter hypodensity is stable. Small perivascular space left posterior lentiform, normal variant. Vascular: Calcified atherosclerosis at the skull base. No suspicious intracranial vascular hyperdensity. Skull: Mild motion artifact today.  Stable, no fracture identified. Sinuses/Orbits: Visualized paranasal sinuses and mastoids are stable and well aerated. Other: No discrete scalp or orbits soft tissue injury identified. IMPRESSION: 1. No acute traumatic injury identified. 2. Stable non contrast CT appearance of chronic white matter disease. Electronically Signed   By: Genevie Ann M.D.   On: 09/03/2020 08:26   Pelvis Portable  Result Date: 09/04/2020 CLINICAL DATA:  Status post right hemiarthroplasty. EXAM: PORTABLE PELVIS 1-2 VIEWS COMPARISON:  Preoperative imaging 09/03/2020 FINDINGS: Right hip hemiarthroplasty in expected alignment. No periprosthetic lucency or fracture. Recent postsurgical change includes air and edema in the soft tissues. Lateral skin staples. IMPRESSION: Right hip hemiarthroplasty without immediate postoperative complication. Electronically Signed   By: Keith Rake M.D.   On: 09/04/2020 19:28   CT Hip Right Wo Contrast  Result  Date: 09/03/2020 CLINICAL DATA:  Right hip pain since the patient suffered a fall last night. Initial encounter. EXAM: CT OF THE RIGHT HIP WITHOUT CONTRAST TECHNIQUE: Multidetector CT imaging of the right hip was performed according to the standard protocol. Multiplanar CT image reconstructions were also generated. COMPARISON:  Plain films right hip earlier today. FINDINGS: Bones/Joint/Cartilage The patient has an acute subcapital fracture of the right hip. The fracture is mildly impacted and there is some medial displacement the neck of the femur. No other acute abnormality is seen. No focal lesion. Small right hip joint effusion due to the fracture is noted. Ligaments Suboptimally assessed by CT. Muscles and Tendons Intact. Soft tissues Negative. There is some stranding in subcutaneous fat about the right hip likely due to contusion. IMPRESSION: Acute subcapital fracture right hip as described. Electronically Signed   By: Inge Rise M.D.   On: 09/03/2020 09:22   DG Knee Complete 4 Views Right  Result Date: 09/03/2020 CLINICAL DATA:  Pain status post fall. EXAM: RIGHT KNEE - COMPLETE 4+ VIEW COMPARISON:  None. FINDINGS: No evidence of fracture, dislocation, or joint effusion. No evidence of arthropathy or other focal bone abnormality. Soft tissues are unremarkable. IMPRESSION: No acute fracture or dislocation of the right knee. Electronically Signed   By: Miachel Roux M.D.   On: 09/03/2020 07:53   DG C-Arm 1-60 Min-No Report  Result Date: 09/04/2020 Fluoroscopy was utilized by the requesting physician.  No radiographic interpretation.   DG HIP OPERATIVE UNILAT W OR W/O PELVIS RIGHT  Result Date: 09/04/2020 CLINICAL DATA:  Hip fracture. EXAM: OPERATIVE RIGHT HIP (WITH PELVIS  IF PERFORMED) TECHNIQUE: Fluoroscopic spot image(s) were submitted for interpretation post-operatively. COMPARISON:  Preoperative imaging 09/03/2020 FINDINGS: Two fluoroscopic spot views of the right hip obtained in the  operating room. Right hip arthroplasty in expected alignment. Fluoroscopy time 10 seconds. IMPRESSION: Procedural fluoroscopy for right hip arthroplasty. Electronically Signed   By: Keith Rake M.D.   On: 09/04/2020 18:03   DG Hip Unilat W or Wo Pelvis 2-3 Views Right  Result Date: 09/03/2020 CLINICAL DATA:  Diffuse right hip pain status post fall EXAM: DG HIP (WITH OR WITHOUT PELVIS) 2-3V RIGHT COMPARISON:  None. FINDINGS: There is subtle lucency in the right femoral neck suspicious for nondisplaced fracture. Evaluation is significantly limited due to patient positioning and overlying structures. IMPRESSION: Subtle lucency in the right femoral neck is suspicious for nondisplaced fracture. Current images are limited. Further evaluation with CT would be beneficial. Electronically Signed   By: Miachel Roux M.D.   On: 09/03/2020 07:52    Assessment/Plan Slow transit constipation  reported the patient's c/o right hip pain vs right side pain, the patient stated it felt like it did whne she had appendicitis. The patient is afebrile, denied nausea, vomiting, or diarrhea. Last BM is about 4-5 days ago. Actually her abd pain is LLQ of abd when palpated, negative for Murphy sign, no tenderness, rebound tenderness, or abd guarding.  X-ray abd, will stat Mg Citrate 188m po, may repeat, continue MiraLax, Senokot, Colace. Observe.   Osteoarthritis, multiple sites  Hospitalized 09/03/20-09/07/20 for s/p R hip hemiarthroplasty due to a mechanical fall, prn Tylenol, Norco available to her.              Hospitalized 08/18/20-08/20/20 for AMS/lethargy, didn't tolerate Mirtazapine for appetite. CT head/abd/pelvis showed stable T11, L1 compression fx  Osteoporosis Takes Evenity.   Blood loss anemia Hgb 10.1 09/07/20, post op.   HTN (hypertension) Blood pressure is controlled, continue Amlodipine.   Alzheimer disease ([place]  Vit B12 499, TSH 2.12 05/21/20  Vitamin B12 deficiency Vit B12 level 499 05/21/20, takes  Vit B12  Vitamin D deficiency takes Vit d daily  Allergic rhinitis  takes Zyrtec.  Hyperlipidemia diet, LDL 118 11/01/19  Depression, recurrent ([place] Her mood is stable, takes Escitalopram, Wellbutrin    Family/ staff Communication: plan of care reviewed with the patient and charge nurse.   Labs/tests ordered:  X-ray abd  Time spend 35 minutes.

## 2020-09-18 NOTE — Assessment & Plan Note (Signed)
Magnesium citrate 142m po stat, may repeat if no BM>4hrs, increase Senokot S II qd, continue MiraLax. X-ray abd, update CBC/diff, CMP/eGFR

## 2020-09-22 ENCOUNTER — Encounter: Payer: Self-pay | Admitting: Nurse Practitioner

## 2020-09-22 DIAGNOSIS — M159 Polyosteoarthritis, unspecified: Secondary | ICD-10-CM | POA: Insufficient documentation

## 2020-09-22 DIAGNOSIS — S72031D Displaced midcervical fracture of right femur, subsequent encounter for closed fracture with routine healing: Secondary | ICD-10-CM | POA: Diagnosis not present

## 2020-09-22 LAB — BASIC METABOLIC PANEL
BUN: 18 (ref 4–21)
CO2: 27 — AB (ref 13–22)
Chloride: 100 (ref 99–108)
Creatinine: 0.5 (ref 0.5–1.1)
Glucose: 48
Potassium: 4.1 (ref 3.4–5.3)
Sodium: 138 (ref 137–147)

## 2020-09-22 LAB — COMPREHENSIVE METABOLIC PANEL
Albumin: 3 — AB (ref 3.5–5.0)
Calcium: 8.5 — AB (ref 8.7–10.7)
Globulin: 2.4

## 2020-09-22 LAB — CBC AND DIFFERENTIAL
HCT: 35 — AB (ref 36–46)
Hemoglobin: 11.3 — AB (ref 12.0–16.0)
Neutrophils Absolute: 4252
Platelets: 342 (ref 150–399)
WBC: 5.7

## 2020-09-22 LAB — HEPATIC FUNCTION PANEL
ALT: 28 (ref 7–35)
AST: 26 (ref 13–35)
Alkaline Phosphatase: 112 (ref 25–125)
Bilirubin, Total: 0.6

## 2020-09-22 LAB — CBC: RBC: 3.63 — AB (ref 3.87–5.11)

## 2020-09-22 NOTE — Assessment & Plan Note (Signed)
Blood pressure is controlled, continue Amlodipine.  

## 2020-09-22 NOTE — Assessment & Plan Note (Signed)
diet, LDL 118 11/01/19

## 2020-09-22 NOTE — Assessment & Plan Note (Signed)
takes Zyrtec. 

## 2020-09-22 NOTE — Assessment & Plan Note (Signed)
Takes Evenity.

## 2020-09-22 NOTE — Assessment & Plan Note (Signed)
reported the patient's c/o right hip pain vs right side pain, the patient stated it felt like it did whne she had appendicitis. The patient is afebrile, denied nausea, vomiting, or diarrhea. Last BM is about 4-5 days ago. Actually her abd pain is LLQ of abd when palpated, negative for Murphy sign, no tenderness, rebound tenderness, or abd guarding.  X-ray abd, will stat Mg Citrate 168m po, may repeat, continue MiraLax, Senokot, Colace. Observe.

## 2020-09-22 NOTE — Assessment & Plan Note (Signed)
Vit B12 level 499 05/21/20, takes Vit B12

## 2020-09-22 NOTE — Assessment & Plan Note (Signed)
takes Vit d daily

## 2020-09-22 NOTE — Assessment & Plan Note (Signed)
Hospitalized 09/03/20-09/07/20 for s/p R hip hemiarthroplasty due to a mechanical fall, prn Tylenol, Norco available to her.              Hospitalized 08/18/20-08/20/20 for AMS/lethargy, didn't tolerate Mirtazapine for appetite. CT head/abd/pelvis showed stable T11, L1 compression fx

## 2020-09-22 NOTE — Assessment & Plan Note (Signed)
Vit B12 499, TSH 2.12 05/21/20

## 2020-09-22 NOTE — Progress Notes (Signed)
This encounter was created in error - please disregard.

## 2020-09-22 NOTE — Assessment & Plan Note (Signed)
Hgb 10.1 09/07/20, post op.

## 2020-09-22 NOTE — Assessment & Plan Note (Signed)
Her mood is stable, takes Escitalopram, Wellbutrin

## 2020-09-29 ENCOUNTER — Encounter: Payer: Self-pay | Admitting: Internal Medicine

## 2020-09-29 ENCOUNTER — Non-Acute Institutional Stay (SKILLED_NURSING_FACILITY): Payer: Medicare Other | Admitting: Internal Medicine

## 2020-09-29 DIAGNOSIS — Z9889 Other specified postprocedural states: Secondary | ICD-10-CM | POA: Diagnosis not present

## 2020-09-29 DIAGNOSIS — M810 Age-related osteoporosis without current pathological fracture: Secondary | ICD-10-CM | POA: Diagnosis not present

## 2020-09-29 DIAGNOSIS — E538 Deficiency of other specified B group vitamins: Secondary | ICD-10-CM | POA: Diagnosis not present

## 2020-09-29 DIAGNOSIS — E559 Vitamin D deficiency, unspecified: Secondary | ICD-10-CM | POA: Diagnosis not present

## 2020-09-29 DIAGNOSIS — Z96641 Presence of right artificial hip joint: Secondary | ICD-10-CM

## 2020-09-29 DIAGNOSIS — I10 Essential (primary) hypertension: Secondary | ICD-10-CM

## 2020-09-29 DIAGNOSIS — F339 Major depressive disorder, recurrent, unspecified: Secondary | ICD-10-CM | POA: Diagnosis not present

## 2020-09-29 DIAGNOSIS — E785 Hyperlipidemia, unspecified: Secondary | ICD-10-CM | POA: Diagnosis not present

## 2020-09-29 DIAGNOSIS — K5901 Slow transit constipation: Secondary | ICD-10-CM | POA: Diagnosis not present

## 2020-09-29 DIAGNOSIS — D500 Iron deficiency anemia secondary to blood loss (chronic): Secondary | ICD-10-CM | POA: Diagnosis not present

## 2020-09-29 NOTE — Progress Notes (Signed)
Location:   [facility] Room Number: 30 Place of Service:  SNF [phone]) Provider:  Veleta Miners MD  Plotnikov, [name], MD  Patient Care Team: Cassandria Anger, MD as PCP - General (Internal Medicine)  Extended Emergency Contact Information Primary Emergency Contact: Burman Blacksmith Address: [address]          [place], [place] [address] [place] Phone: [phone] Relation: Spouse Secondary Emergency Contact: [facility] Phone: [phone] Relation: Daughter  Code Status:  Full Code Goals of care: Advanced Directive information Advanced Directives 09/29/2020  Does Patient Have a Medical Advance Directive? Yes  Type of Paramedic of [place];Living will  Does patient want to make changes to medical advance directive? No - Patient declined  Copy of [place] in Chart? No - copy requested  Would patient like information on creating a medical advance directive? No - Patient declined     Chief Complaint  Patient presents with   Acute Visit     HPI:  Pt is a 80 y.o. female seen today for an acute visit for Follow up of her Meds per her request    Patient admitted in the hospital from 7/28-8/1 for closed right hip fracture S/p right hip hemiarthroplasty   Patient has h/o Osteoporosis with Multiple Compression Fracture on Evenity per Dr [name] H/o Weight loss ? Depression Has lost 20 lbs in past 3 months Was also admitted in the hospital from 7/12-7/14 for Change in Mental status most likely due to Remeron has CT scan showed moderate atrophy with chronic microvascular ischemia Was started on Wellbutrin for her depression  Per Social worker she still some depressed but doing better Though has lost weight and now weighs 81 lbs  She says she does not like the food here Trying to drink Supplements Also wants her Meds reviewed. She wants to stop some of her meds Walking  with Therapy. Not independent yet Pain seems controlled Does not want to take Norco.      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   Depression    years ago   Headache    sinus headaches   Lipoma of arm    Right   Medical history non-contributory    Osteoporosis    SVD (spontaneous vaginal delivery)    x 2   Varicose vein    Vitamin D deficiency    Past Surgical History:  Procedure Laterality Date   ANTERIOR APPROACH HEMI HIP ARTHROPLASTY Right 09/04/2020   Procedure: ANTERIOR APPROACH HEMI HIP ARTHROPLASTY;  Surgeon: Rod Can, MD;  Location: [facility];  Service: Orthopedics;  Laterality: Right;   [place]   COLONOSCOPY     GANGLION CYST EXCISION     x2 left and right arm   HYSTEROSCOPY WITH D & C N/A 07/15/2013   Procedure: DILATATION AND CURETTAGE /HYSTEROSCOPY;  Surgeon: Lovenia Kim, MD;  Location: [facility];  Service: Gynecology;  Laterality: N/A;   KYPHOPLASTY Bilateral 05/04/2020   Procedure: KYPHOPLASTY THORACIC TWELVE;  Surgeon: Vallarie Mare, MD;  Location: [facility];  Service: Neurosurgery;  Laterality: Bilateral;   MANDIBLE SURGERY  1954   WISDOM TOOTH EXTRACTION      Allergies  Allergen Reactions   Alendronate Sodium Other (See Comments)    Leg cramps and "  Allergic," per MAR   Risedronate Sodium Other (See Comments)    Caused the patient to be achy and is "Allergic," per MAR   Tramadol Other (See Comments)    Caused the patient to feel badly    Keflex [Cephalexin] Other (See Comments)    Possible rash    Allergies as of 09/29/2020       Reactions   Alendronate Sodium Other (See Comments)   Leg cramps and "Allergic," per MAR   Risedronate Sodium Other (See Comments)   Caused the patient to be achy and is "Allergic," per MAR   Tramadol Other (See Comments)   Caused the patient to feel badly    Keflex [cephalexin] Other (See Comments)   Possible  rash        Medication List        Accurate as of September 29, 2020 12:22 PM. If you have any questions, ask your nurse or doctor.          acetaminophen 500 MG tablet Commonly known as: TYLENOL Take 1,000 mg by mouth every 6 (six) hours as needed for moderate pain, mild pain, headache or fever.   amLODipine 2.5 MG tablet Commonly known as: NORVASC Take 2.5 mg by mouth daily.   aspirin 81 MG chewable tablet Commonly known as: Aspirin Childrens Chew 1 tablet (81 mg total) by mouth 2 (two) times daily with a meal.   buPROPion 75 MG tablet Commonly known as: WELLBUTRIN Take 75 mg by mouth 2 (two) times daily.   cetirizine 10 MG tablet Commonly known as: ZyrTEC Allergy Take 1 tablet (10 mg total) by mouth daily.   docusate sodium 100 MG capsule Commonly known as: COLACE Take 100 mg by mouth at bedtime.   escitalopram 10 MG tablet Commonly known as: LEXAPRO Take 10 mg by mouth every evening.   gabapentin 100 MG capsule Commonly known as: NEURONTIN Take 1 capsule (100 mg total) by mouth 3 (three) times daily.   HYDROcodone-acetaminophen 5-325 MG tablet Commonly known as: NORCO/VICODIN Take 1-2 tablets by mouth every 4 (four) hours as needed for moderate pain (pain score 4-6).   Melatonin 10 MG Tabs Take 10 mg by mouth at bedtime.   polyethylene glycol 17 g packet Commonly known as: MIRALAX / GLYCOLAX Take 17 g by mouth daily as needed for moderate constipation or mild constipation.   PRESERVISION AREDS 2 PO Take 1 capsule by mouth 2 (two) times daily.   PRO-STAT AWC PO Take by mouth.   RESOURCE 2.0 PO Take 180 mLs by mouth daily at 2 PM.   Salonpas 3.02-13-08 % Ptch Generic drug: Camphor-Menthol-Methyl Sal Apply 1 patch topically See admin instructions. Apply 1 patch to the lower back and remove (at most) after 8 hours- once a day   senna-docusate 8.6-50 MG tablet Commonly known as: Senokot-S Take 1 tablet by mouth daily.   VITAMIN B12-FOLIC ACID  PO Place 1 lozenge under the tongue in the morning.   Vitamin D3 50 MCG (2000 UT) capsule Take 1 capsule (2,000 Units total) by mouth daily.        Review of Systems  Constitutional:  Positive for activity change, appetite change and unexpected weight change.  HENT: Negative.    Respiratory: Negative.    Cardiovascular: Negative.   Gastrointestinal:  Positive for constipation.  Genitourinary: Negative.   Musculoskeletal:  Positive for arthralgias, gait problem and myalgias.  Skin: Negative.   Neurological:  Positive for weakness.  Psychiatric/Behavioral:  Positive for dysphoric mood.  Immunization History  Administered Date(s) Administered   DT (Pediatric) 04/10/2002   Fluad Quad(high Dose 65+) 10/28/2019   Influenza Split 03/08/2011, 11/17/2011   Influenza Whole 01/09/2007, 11/05/2008, 11/04/2009   Influenza, High Dose Seasonal PF 12/07/2015, 09/29/2016, 11/09/2017, 09/27/2018, 10/28/2019   Influenza-Unspecified 10/30/2012, 11/29/2013, 12/02/2014   Moderna Sars-Covid-2 Vaccination 02/08/2019, 03/11/2019   PFIZER(Purple Top)SARS-COV-2 Vaccination 10/09/2019   PPD Test 12/22/2015, 01/05/2016   Pneumococcal Conjugate-13 07/17/2014   Pneumococcal Polysaccharide-23 08/15/2005, 09/25/2015   Tetanus 12/24/2012   Zoster Recombinat (Shingrix) 06/07/2019, 09/06/2019   Zoster, Live 12/05/2013   Pertinent  Health Maintenance Due  Topic Date Due   INFLUENZA VACCINE  09/07/2020   DEXA SCAN  Completed   PNA vac Low Risk Adult  Completed   Fall Risk  10/10/2019 10/08/2018 10/02/2017 09/29/2016 10/08/2015  Falls in the past year? 0 0 No No No  Comment - - - - [facility] Telephone Survey: data to providers prior to load  Number falls in past yr: 0 0 - - -  Injury with Fall? 0 0 - - -  Risk for fall due to : No Fall Risks - - - -  Follow up Falls evaluation completed - - - -   Functional Status Survey:    Vitals:   09/29/20 1120  BP: (!) 110/57  Pulse: 84  Resp: 18  Temp: 97.8 F  (36.6 C)  SpO2: 94%  Weight: 81 lb 8 oz (37 kg)  Height: '5\' 4"'$  (1.626 m)   Body mass index is 13.99 kg/m. Physical Exam Vitals reviewed.  Constitutional:      Appearance: Normal appearance.     Comments: Very frail  HENT:     Head: Normocephalic.     Nose: Nose normal.     Mouth/Throat:     Mouth: Mucous membranes are moist.     Pharynx: Oropharynx is clear.  Eyes:     Pupils: Pupils are equal, round, and reactive to light.  Cardiovascular:     Rate and Rhythm: Normal rate and regular rhythm.     Pulses: Normal pulses.  Pulmonary:     Effort: Pulmonary effort is normal.     Breath sounds: Normal breath sounds.  Abdominal:     General: Abdomen is flat. Bowel sounds are normal.  Musculoskeletal:        General: No swelling.     Cervical back: Neck supple.  Skin:    General: Skin is warm.  Neurological:     General: No focal deficit present.     Mental Status: She is alert and oriented to person, place, and time.  Psychiatric:        Mood and Affect: Mood normal.        Thought Content: Thought content normal.    Labs reviewed: Recent Labs    08/18/20 2330 08/19/20 0429 09/03/20 0716 09/04/20 1917 09/05/20 0537 09/06/20 0533 09/18/20 0000 09/22/20 0000  NA  --  137 136  --  133* 135 138 138  K  --  3.6 3.7  --  3.2* 3.5 4.0 4.1  CL  --  104 100  --  101 102 101 100  CO2  --  24  --   --  25 27 30* 27*  GLUCOSE  --  80 86  --  110* 95  --   --   BUN  --  10 16  --  '23 18 17 18  '$ CREATININE  --  <0.30* 0.50   < > 0.59  0.40* 0.4* 0.5  CALCIUM  --  8.6*  --   --  8.6* 8.7* 8.3* 8.5*  MG 2.2 2.1  --   --   --   --   --   --   PHOS  --  3.1  --   --   --   --   --   --    < > = values in this interval not displayed.   Recent Labs    07/09/20 1830 08/18/20 1622 08/19/20 0429 09/18/20 0000 09/22/20 0000  AST '18 25 22 21 26  '$ ALT '15 18 18 22 28  '$ ALKPHOS 73 91 86 100 112  BILITOT 0.7 0.9 1.0  --   --   PROT 5.5* 6.3* 6.4*  --   --   ALBUMIN 3.1* 3.7 3.9  3.1* 3.0*   Recent Labs    09/03/20 0700 09/03/20 0716 09/05/20 0537 09/06/20 0533 09/07/20 0400 09/18/20 0000 09/22/20 0000  WBC 12.4*   < > 11.8* 9.6 8.1 6.5 5.7  NEUTROABS 11.0*  --   --   --   --  4,979.00 4,252.00  HGB 13.4   < > 11.4* 11.1* 10.1* 11.1* 11.3*  HCT 41.5   < > 34.3* 34.1* 30.1* 34* 35*  MCV 94.7   < > 93.0 93.2 92.0  --   --   PLT 165   < > 134* 121* 136*  --  342   < > = values in this interval not displayed.   Lab Results  Component Value Date   TSH 2.12 05/21/2020   No results found for: HGBA1C Lab Results  Component Value Date   CHOL 201 (H) 11/01/2019   HDL 69 11/01/2019   LDLCALC 118 (H) 11/01/2019   LDLDIRECT 135.4 12/26/2008   TRIG 52 11/01/2019   CHOLHDL 2.9 11/01/2019    Significant Diagnostic Results in last 30 days:  CT Head Wo Contrast  Result Date: 09/03/2020 CLINICAL DATA:  80 year old female status post unwitnessed fall, posterior head injury. EXAM: CT HEAD WITHOUT CONTRAST TECHNIQUE: Contiguous axial images were obtained from the base of the skull through the vertex without intravenous contrast. COMPARISON:  Head CT 08/18/2020. FINDINGS: Brain: Stable cerebral volume. No midline shift, ventriculomegaly, mass effect, evidence of mass lesion, intracranial hemorrhage or evidence of cortically based acute infarction. Patchy and confluent bilateral white matter hypodensity is stable. Small perivascular space left posterior lentiform, normal variant. Vascular: Calcified atherosclerosis at the skull base. No suspicious intracranial vascular hyperdensity. Skull: Mild motion artifact today.  Stable, no fracture identified. Sinuses/Orbits: Visualized paranasal sinuses and mastoids are stable and well aerated. Other: No discrete scalp or orbits soft tissue injury identified. IMPRESSION: 1. No acute traumatic injury identified. 2. Stable non contrast CT appearance of chronic white matter disease. Electronically Signed   By: Genevie Ann M.D.   On: 09/03/2020  08:26   Pelvis Portable  Result Date: 09/04/2020 CLINICAL DATA:  Status post right hemiarthroplasty. EXAM: PORTABLE PELVIS 1-2 VIEWS COMPARISON:  Preoperative imaging 09/03/2020 FINDINGS: Right hip hemiarthroplasty in expected alignment. No periprosthetic lucency or fracture. Recent postsurgical change includes air and edema in the soft tissues. Lateral skin staples. IMPRESSION: Right hip hemiarthroplasty without immediate postoperative complication. Electronically Signed   By: Keith Rake M.D.   On: 09/04/2020 19:28   CT Hip Right Wo Contrast  Result Date: 09/03/2020 CLINICAL DATA:  Right hip pain since the patient suffered a fall last night. Initial encounter. EXAM: CT OF THE RIGHT HIP WITHOUT  CONTRAST TECHNIQUE: Multidetector CT imaging of the right hip was performed according to the standard protocol. Multiplanar CT image reconstructions were also generated. COMPARISON:  Plain films right hip earlier today. FINDINGS: Bones/Joint/Cartilage The patient has an acute subcapital fracture of the right hip. The fracture is mildly impacted and there is some medial displacement the neck of the femur. No other acute abnormality is seen. No focal lesion. Small right hip joint effusion due to the fracture is noted. Ligaments Suboptimally assessed by CT. Muscles and Tendons Intact. Soft tissues Negative. There is some stranding in subcutaneous fat about the right hip likely due to contusion. IMPRESSION: Acute subcapital fracture right hip as described. Electronically Signed   By: Inge Rise M.D.   On: 09/03/2020 09:22   DG Knee Complete 4 Views Right  Result Date: 09/03/2020 CLINICAL DATA:  Pain status post fall. EXAM: RIGHT KNEE - COMPLETE 4+ VIEW COMPARISON:  None. FINDINGS: No evidence of fracture, dislocation, or joint effusion. No evidence of arthropathy or other focal bone abnormality. Soft tissues are unremarkable. IMPRESSION: No acute fracture or dislocation of the right knee. Electronically  Signed   By: Miachel Roux M.D.   On: 09/03/2020 07:53   DG C-Arm 1-60 Min-No Report  Result Date: 09/04/2020 Fluoroscopy was utilized by the requesting physician.  No radiographic interpretation.   DG HIP OPERATIVE UNILAT W OR W/O PELVIS RIGHT  Result Date: 09/04/2020 CLINICAL DATA:  Hip fracture. EXAM: OPERATIVE RIGHT HIP (WITH PELVIS IF PERFORMED) TECHNIQUE: Fluoroscopic spot image(s) were submitted for interpretation post-operatively. COMPARISON:  Preoperative imaging 09/03/2020 FINDINGS: Two fluoroscopic spot views of the right hip obtained in the operating room. Right hip arthroplasty in expected alignment. Fluoroscopy time 10 seconds. IMPRESSION: Procedural fluoroscopy for right hip arthroplasty. Electronically Signed   By: Keith Rake M.D.   On: 09/04/2020 18:03   DG Hip Unilat W or Wo Pelvis 2-3 Views Right  Result Date: 09/03/2020 CLINICAL DATA:  Diffuse right hip pain status post fall EXAM: DG HIP (WITH OR WITHOUT PELVIS) 2-3V RIGHT COMPARISON:  None. FINDINGS: There is subtle lucency in the right femoral neck suspicious for nondisplaced fracture. Evaluation is significantly limited due to patient positioning and overlying structures. IMPRESSION: Subtle lucency in the right femoral neck is suspicious for nondisplaced fracture. Current images are limited. Further evaluation with CT would be beneficial. Electronically Signed   By: Miachel Roux M.D.   On: 09/03/2020 07:52    Assessment/Plan  History of hemiarthroplasty of right hip Does not want to take Norco Will discontinue it Use Tylenol pRN Hypertension, unspecified type BP running in lower side Discontinue Norvasc for now Will Continue to monitor Age-related osteoporosis without current pathological fracture been on Evenity per her PCP Would need Follow up D/w Pharmacy it is going to cost a lot for Patient to get shot in Facility Would need to resume in Office  Will continue to hold it for now  Slow transit  constipation On Miralax and   Blood loss anemia Hgb is good now  Vitamin B12 deficiency On supplement Vitamin D deficiency Continue Vit D   Depression, recurrent (HCC) Change Wellbutrin to QD 150 mg for reducing the pill  Also on Lexapro Continue to monuitor her weight   Family/ staff Communication:   Labs/tests ordered:    Total time spent in this patient care encounter was  45_  minutes; greater than 50% of the visit spent counseling patient and staff, reviewing records , Labs and coordinating care for problems addressed at  this encounter.

## 2020-10-09 ENCOUNTER — Encounter: Payer: Self-pay | Admitting: Nurse Practitioner

## 2020-10-09 ENCOUNTER — Non-Acute Institutional Stay (SKILLED_NURSING_FACILITY): Payer: Medicare Other | Admitting: Nurse Practitioner

## 2020-10-09 DIAGNOSIS — E559 Vitamin D deficiency, unspecified: Secondary | ICD-10-CM | POA: Diagnosis not present

## 2020-10-09 DIAGNOSIS — G309 Alzheimer's disease, unspecified: Secondary | ICD-10-CM | POA: Diagnosis not present

## 2020-10-09 DIAGNOSIS — F0280 Dementia in other diseases classified elsewhere without behavioral disturbance: Secondary | ICD-10-CM

## 2020-10-09 DIAGNOSIS — I10 Essential (primary) hypertension: Secondary | ICD-10-CM | POA: Diagnosis not present

## 2020-10-09 DIAGNOSIS — D500 Iron deficiency anemia secondary to blood loss (chronic): Secondary | ICD-10-CM

## 2020-10-09 DIAGNOSIS — M150 Primary generalized (osteo)arthritis: Secondary | ICD-10-CM

## 2020-10-09 DIAGNOSIS — F339 Major depressive disorder, recurrent, unspecified: Secondary | ICD-10-CM

## 2020-10-09 DIAGNOSIS — E538 Deficiency of other specified B group vitamins: Secondary | ICD-10-CM

## 2020-10-09 DIAGNOSIS — D696 Thrombocytopenia, unspecified: Secondary | ICD-10-CM

## 2020-10-09 DIAGNOSIS — M159 Polyosteoarthritis, unspecified: Secondary | ICD-10-CM

## 2020-10-09 DIAGNOSIS — M545 Low back pain, unspecified: Secondary | ICD-10-CM | POA: Diagnosis not present

## 2020-10-09 DIAGNOSIS — M8949 Other hypertrophic osteoarthropathy, multiple sites: Secondary | ICD-10-CM

## 2020-10-09 DIAGNOSIS — E785 Hyperlipidemia, unspecified: Secondary | ICD-10-CM

## 2020-10-09 DIAGNOSIS — J3089 Other allergic rhinitis: Secondary | ICD-10-CM

## 2020-10-09 DIAGNOSIS — R634 Abnormal weight loss: Secondary | ICD-10-CM | POA: Diagnosis not present

## 2020-10-09 DIAGNOSIS — M810 Age-related osteoporosis without current pathological fracture: Secondary | ICD-10-CM

## 2020-10-09 DIAGNOSIS — K5901 Slow transit constipation: Secondary | ICD-10-CM | POA: Diagnosis not present

## 2020-10-09 NOTE — Assessment & Plan Note (Signed)
Weight loss/adult failure to thrive, didn't tolerate Mirtazapine. Supportive care.

## 2020-10-09 NOTE — Assessment & Plan Note (Signed)
plt 136 09/07/20

## 2020-10-09 NOTE — Assessment & Plan Note (Signed)
diet, LDL 118 11/01/19

## 2020-10-09 NOTE — Progress Notes (Signed)
Location:   [facility] Number: [phone] Place of Service:  SNF (31)  Provider: Lonnell Chaput X Krystena Reitter, NP  PCP:  Patient Care Team: Manami Tutor X, NP as PCP - General (Internal Medicine)  Extended Emergency Contact Information Primary Emergency Contact: Rasheda, [place] Address: [address]          [place], [place] [address] [place] Phone: [phone] Relation: Spouse Secondary Emergency Contact: [facility] Phone: [phone] Relation: Daughter  Code Status: DNR Goals of care:  Advanced Directive information Advanced Directives 10/09/2020  Does Patient Have a Medical Advance Directive? Yes;No  Type of Advance Directive -  Does patient want to make changes to medical advance directive? No - Patient declined  Copy of [place] in Chart? -  Would patient like information on creating a medical advance directive? -     Allergies  Allergen Reactions   Alendronate Sodium Other (See Comments)    Leg cramps and "Allergic," per MAR   Risedronate Sodium Other (See Comments)    Caused the patient to be achy and is "Allergic," per MAR   Tramadol Other (See Comments)    Caused the patient to feel badly    Keflex [Cephalexin] Other (See Comments)    Possible rash    Chief Complaint  Patient presents with   Discharge Note    Patient being discharged     HPI:  80 y.o. female with medical history of osteoarthritis, HTN, post op anemia, constipation, Alzheimer's dementia, weight loss was admitted to [facility] for therapy following hospitalization 08/18/20-08/20/20 for AMS/lethargy, didn't tolerate Mirtazapine for appetite. CT head/abd/pelvis showed stable T11, L1 compression fx. Her SNF [facility] rehab was complicated by re hospitalization 09/03/20-09/07/20 for s/p R hip hemiarthroplasty due to a mechanical fall, prn Tylenol  available to her.              The patient has regained her physical strength, ADL function, ready for [facility]  level of care.              OP, on Evenity             Anemia, Hgb 11.3 09/22/20, post op.              HTN, off Amlodipine. Bun/creat 18/0.5 09/22/20             Constipation, takes Colace, Senokot S, MiraLax.              Alzheimer's Dementia, Vit B12 499, TSH 2.12 05/21/20             Vit B12 deficiency, Vit B12 level 499 05/21/20, takes Vit B12             Vitamin D deficiency, takes Vit d daily             Allergic rhinitis, takes Zyrtec.             Hyperlipidemia, diet, LDL 118 11/01/19             Depression, takes Escitalopram, decreased Wellbutrin 09/29/20             Chronic back pain, stable, T11, L1 compression fx, takes Tylenol, Gabapentin             Weight loss/adult failure to thrive, didn't tolerate Mirtazapine.             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   Depression    years ago   Headache    sinus headaches   Lipoma of arm    Right   Medical history non-contributory    Osteoporosis    SVD (spontaneous vaginal delivery)    x 2   Varicose vein    Vitamin D deficiency     Past Surgical History:  Procedure Laterality Date   ANTERIOR APPROACH HEMI HIP ARTHROPLASTY Right 09/04/2020   Procedure: ANTERIOR APPROACH HEMI HIP ARTHROPLASTY;  Surgeon: Rod Can, MD;  Location: [facility];  Service: Orthopedics;  Laterality: Right;   [place]   COLONOSCOPY     GANGLION CYST EXCISION     x2 left and right arm   HYSTEROSCOPY WITH D & C N/A 07/15/2013   Procedure: DILATATION AND CURETTAGE /HYSTEROSCOPY;  Surgeon: Lovenia Kim, MD;  Location: [facility];  Service: Gynecology;  Laterality: N/A;   KYPHOPLASTY Bilateral 05/04/2020   Procedure: KYPHOPLASTY THORACIC TWELVE;  Surgeon: Vallarie Mare, MD;  Location: [facility];  Service: Neurosurgery;  Laterality: Bilateral;   MANDIBLE SURGERY  1954   WISDOM TOOTH EXTRACTION        reports  that she has never smoked. She has never used smokeless tobacco. She reports that she does not drink alcohol and does not use drugs. Social History   Socioeconomic History   Marital status: Married    Spouse name: Not on file   Number of children: 1   Years of education: Not on file   Highest education level: Not on file  Occupational History   Occupation: retired  Tobacco Use   Smoking status: Never   Smokeless tobacco: Never  Vaping Use   Vaping Use: Never used  Substance and Sexual Activity   Alcohol use: No   Drug use: No   Sexual activity: Yes    Birth control/protection: Post-menopausal  Other Topics Concern   Not on file  Social History Narrative   GI in [place] for colonoscopy   Dr Ronita Hipps - GYN      Denies Surgical history      Family history of varicose veins   Mother is 61      Regular exercise - NO   Social Determinants of Health   Financial Resource Strain: Low Risk    Difficulty of Paying Living Expenses: Not hard at all  Food Insecurity: No Food Insecurity   Worried About Charity fundraiser in the Last Year: Never true   [place] in the Last Year: Never true  Transportation Needs: No Transportation Needs   Lack of Transportation (Medical): No   Lack of Transportation (Non-Medical): No  Physical Activity: Sufficiently Active   Days of Exercise per Week: 5 days   Minutes of Exercise per Session: 30 min  Stress: Stress Concern Present   Feeling of Stress : Rather much  Social Connections: Moderately Integrated   Frequency of Communication with Friends and Family: More than three times a week   Frequency of Social Gatherings with Friends and Family: Never   Attends Religious Services: Never   Marine scientist or Organizations: Yes   Attends Archivist Meetings: Never   Marital Status: Married  Human resources officer Violence: Not At Risk   Fear of Current or Ex-Partner: No   Emotionally Abused: No   Physically Abused:  No   Sexually  Abused: No   Functional Status Survey:    Allergies  Allergen Reactions   Alendronate Sodium Other (See Comments)    Leg cramps and "Allergic," per MAR   Risedronate Sodium Other (See Comments)    Caused the patient to be achy and is "Allergic," per MAR   Tramadol Other (See Comments)    Caused the patient to feel badly    Keflex [Cephalexin] Other (See Comments)    Possible rash    Pertinent  Health Maintenance Due  Topic Date Due   INFLUENZA VACCINE  09/07/2020   DEXA SCAN  Completed   PNA vac Low Risk Adult  Completed    Medications: Allergies as of 10/09/2020       Reactions   Alendronate Sodium Other (See Comments)   Leg cramps and "Allergic," per MAR   Risedronate Sodium Other (See Comments)   Caused the patient to be achy and is "Allergic," per MAR   Tramadol Other (See Comments)   Caused the patient to feel badly    Keflex [cephalexin] Other (See Comments)   Possible rash        Medication List        Accurate as of October 09, 2020 11:59 PM. If you have any questions, ask your nurse or doctor.          acetaminophen 500 MG tablet Commonly known as: TYLENOL Take 1,000 mg by mouth every 6 (six) hours as needed for moderate pain, mild pain, headache or fever.   aspirin 81 MG chewable tablet Commonly known as: Aspirin Childrens Chew 1 tablet (81 mg total) by mouth 2 (two) times daily with a meal.   buPROPion 150 MG 24 hr tablet Commonly known as: WELLBUTRIN XL Take 150 mg by mouth daily.   CALCIUM-CARB 600 PO Take 1 tablet by mouth 2 (two) times daily.   cetirizine 10 MG tablet Commonly known as: ZyrTEC Allergy Take 1 tablet (10 mg total) by mouth daily.   docusate sodium 100 MG capsule Commonly known as: COLACE Take 100 mg by mouth at bedtime.   escitalopram 10 MG tablet Commonly known as: LEXAPRO Take 10 mg by mouth every evening.   gabapentin 100 MG capsule Commonly known as: NEURONTIN Take 1 capsule (100 mg total) by mouth 3  (three) times daily.   Melatonin 10 MG Tabs Take 10 mg by mouth at bedtime.   polyethylene glycol 17 g packet Commonly known as: MIRALAX / GLYCOLAX Take 17 g by mouth daily as needed for moderate constipation or mild constipation.   PRESERVISION AREDS 2 PO Take 1 capsule by mouth 2 (two) times daily.   PRO-STAT AWC PO Take by mouth.   RESOURCE 2.0 PO Take 180 mLs by mouth daily at 2 PM.   Salonpas 3.02-13-08 % Ptch Generic drug: Camphor-Menthol-Methyl Sal Apply 1 patch topically See admin instructions. Apply 1 patch to the lower back and remove (at most) after 8 hours- once a day   senna-docusate 8.6-50 MG tablet Commonly known as: Senokot-S Take 1 tablet by mouth daily.   VITAMIN B12-FOLIC ACID PO Place 1 lozenge under the tongue in the morning.   Vitamin D3 50 MCG (2000 UT) capsule Take 1 capsule (2,000 Units total) by mouth daily.        Review of Systems  Constitutional:  Negative for fatigue, fever and unexpected weight change.       #80Ibs  HENT:  Positive for hearing loss. Negative for congestion and  voice change.   Eyes:  Negative for visual disturbance.  Respiratory:  Negative for cough and shortness of breath.   Cardiovascular:  Negative for leg swelling.  Gastrointestinal:  Negative for abdominal pain and constipation.  Genitourinary:  Negative for dysuria and urgency.  Musculoskeletal:  Positive for arthralgias, back pain and gait problem.       R hip pain is minimal.   Skin:  Negative for color change.  Neurological:  Negative for speech difficulty, weakness and headaches.       Dementia.   Psychiatric/Behavioral:  Negative for behavioral problems and confusion. The patient is not nervous/anxious.    Vitals:   10/09/20 1028  BP: (!) 117/59  Pulse: 98  Resp: 18  Temp: (!) 97.1 F (36.2 C)  SpO2: 98%  Weight: 80 lb 14.4 oz (36.7 kg)  Height: '5\' 4"'$  (1.626 m)   Body mass index is 13.89 kg/m. Physical Exam Vitals and nursing note reviewed.   Constitutional:      Appearance: Normal appearance.  HENT:     Head: Normocephalic.     Nose: Nose normal.     Mouth/Throat:     Mouth: Mucous membranes are moist.  Eyes:     Conjunctiva/sclera: Conjunctivae normal.     Pupils: Pupils are equal, round, and reactive to light.  Cardiovascular:     Rate and Rhythm: Normal rate.     Heart sounds: No murmur heard. Pulmonary:     Effort: Pulmonary effort is normal.     Breath sounds: No wheezing, rhonchi or rales.  Abdominal:     General: Bowel sounds are normal.     Palpations: Abdomen is soft.     Tenderness: There is no abdominal tenderness.  Musculoskeletal:     Cervical back: Normal range of motion and neck supple.     Right lower leg: No edema.     Left lower leg: No edema.     Comments: Right hip s/p ORIF 09/08/20, healed.   Skin:    General: Skin is warm and dry.  Neurological:     General: No focal deficit present.     Mental Status: She is alert. Mental status is at baseline.     Motor: No weakness.     Coordination: Coordination normal.     Gait: Gait abnormal.     Comments: Oriented to self.   Psychiatric:        Mood and Affect: Mood normal.        Behavior: Behavior normal.    Labs reviewed: Basic Metabolic Panel: Recent Labs    08/18/20 2330 08/19/20 0429 09/03/20 0716 09/04/20 1917 09/05/20 0537 09/06/20 0533 09/18/20 0000 09/22/20 0000  NA  --  137 136  --  133* 135 138 138  K  --  3.6 3.7  --  3.2* 3.5 4.0 4.1  CL  --  104 100  --  101 102 101 100  CO2  --  24  --   --  25 27 30* 27*  GLUCOSE  --  80 86  --  110* 95  --   --   BUN  --  10 16  --  '23 18 17 18  '$ CREATININE  --  <0.30* 0.50   < > 0.59 0.40* 0.4* 0.5  CALCIUM  --  8.6*  --   --  8.6* 8.7* 8.3* 8.5*  MG 2.2 2.1  --   --   --   --   --   --  PHOS  --  3.1  --   --   --   --   --   --    < > = values in this interval not displayed.   Liver Function Tests: Recent Labs    07/09/20 1830 08/18/20 1622 08/19/20 0429 09/18/20 0000  09/22/20 0000  AST '18 25 22 21 26  '$ ALT '15 18 18 22 28  '$ ALKPHOS 73 91 86 100 112  BILITOT 0.7 0.9 1.0  --   --   PROT 5.5* 6.3* 6.4*  --   --   ALBUMIN 3.1* 3.7 3.9 3.1* 3.0*   Recent Labs    08/18/20 1622  LIPASE 34   No results for input(s): AMMONIA in the last 8760 hours. CBC: Recent Labs    09/03/20 0700 09/03/20 0716 09/05/20 0537 09/06/20 0533 09/07/20 0400 09/18/20 0000 09/22/20 0000  WBC 12.4*   < > 11.8* 9.6 8.1 6.5 5.7  NEUTROABS 11.0*  --   --   --   --  4,979.00 4,252.00  HGB 13.4   < > 11.4* 11.1* 10.1* 11.1* 11.3*  HCT 41.5   < > 34.3* 34.1* 30.1* 34* 35*  MCV 94.7   < > 93.0 93.2 92.0  --   --   PLT 165   < > 134* 121* 136*  --  342   < > = values in this interval not displayed.   Cardiac Enzymes: No results for input(s): CKTOTAL, CKMB, CKMBINDEX, TROPONINI in the last 8760 hours. BNP: Invalid input(s): POCBNP CBG: Recent Labs    08/18/20 1736  GLUCAP 72    Procedures and Imaging Studies During Stay: No results found.  Assessment/Plan:   HTN (hypertension) off Amlodipine, blood pressure is in control.   Slow transit constipation takes Colace, Senokot S, MiraLax.   Alzheimer disease ([place])  Vit B12 499, TSH 2.12 05/21/20, 08/26/20 MMSE 28/30, no clock drawing. Supportive care.    Vitamin B12 deficiency Vit B12 deficiency, Vit B12 level 499 05/21/20, takes Vit B12  Vitamin D deficiency Vitamin D deficiency, takes Vit d daily  Allergic rhinitis  takes Zyrtec.  Hyperlipidemia  diet, LDL 118 11/01/19  Depression, recurrent (HCC) takes Escitalopram, decreased Wellbutrin 09/29/20  Low back pain Chronic back pain, stable, T11, L1 compression fx, takes Tylenol, Gabapentin  Weight loss, abnormal Weight loss/adult failure to thrive, didn't tolerate Mirtazapine. Supportive care.   Thrombocytopenia ([place]) plt 136 09/07/20  Blood loss anemia Hgb 11.3 09/22/20, post op.   Osteoporosis on Evenity  Osteoarthritis, multiple sites S/p R hip  hemiarthroplasty, able to ambulate with walker.     Patient is being discharged with the following home health services:    Patient is being discharged with the following durable medical equipment:    Patient has been advised to f/u with their PCP in 1-2 weeks to bring them up to date on their rehab stay.  Social services at facility was responsible for arranging this appointment.  Pt was provided with a 30 day supply of prescriptions for medications and refills must be obtained from their PCP.  For controlled substances, a more limited supply may be provided adequate until PCP appointment only.  Future labs/tests needed:  prn

## 2020-10-09 NOTE — Assessment & Plan Note (Signed)
takes Escitalopram, decreased Wellbutrin 09/29/20

## 2020-10-09 NOTE — Assessment & Plan Note (Addendum)
off Amlodipine, blood pressure is in control.

## 2020-10-09 NOTE — Assessment & Plan Note (Signed)
S/p R hip hemiarthroplasty, able to ambulate with walker.

## 2020-10-09 NOTE — Assessment & Plan Note (Signed)
Hgb 11.3 09/22/20, post op.

## 2020-10-09 NOTE — Assessment & Plan Note (Addendum)
Vit B12 499, TSH 2.12 05/21/20, 08/26/20 MMSE 28/30, no clock drawing. Supportive care.

## 2020-10-09 NOTE — Assessment & Plan Note (Signed)
Vitamin D deficiency, takes Vit d daily

## 2020-10-09 NOTE — Assessment & Plan Note (Signed)
Vit B12 deficiency, Vit B12 level 499 05/21/20, takes Vit B12

## 2020-10-09 NOTE — Assessment & Plan Note (Signed)
Chronic back pain, stable, T11, L1 compression fx, takes Tylenol, Gabapentin

## 2020-10-09 NOTE — Assessment & Plan Note (Signed)
takes Colace, Senokot S, MiraLax  

## 2020-10-09 NOTE — Assessment & Plan Note (Signed)
takes Zyrtec. 

## 2020-10-09 NOTE — Assessment & Plan Note (Signed)
on [employer]

## 2020-10-14 ENCOUNTER — Encounter: Payer: Self-pay | Admitting: Nurse Practitioner

## 2020-10-19 DIAGNOSIS — Z96641 Presence of right artificial hip joint: Secondary | ICD-10-CM | POA: Diagnosis not present

## 2020-10-19 DIAGNOSIS — S72011D Unspecified intracapsular fracture of right femur, subsequent encounter for closed fracture with routine healing: Secondary | ICD-10-CM | POA: Diagnosis not present

## 2020-10-19 DIAGNOSIS — Z9181 History of falling: Secondary | ICD-10-CM | POA: Diagnosis not present

## 2020-10-19 DIAGNOSIS — M6281 Muscle weakness (generalized): Secondary | ICD-10-CM | POA: Diagnosis not present

## 2020-10-19 DIAGNOSIS — R29898 Other symptoms and signs involving the musculoskeletal system: Secondary | ICD-10-CM | POA: Diagnosis not present

## 2020-10-19 DIAGNOSIS — R2681 Unsteadiness on feet: Secondary | ICD-10-CM | POA: Diagnosis not present

## 2020-10-20 DIAGNOSIS — M545 Low back pain, unspecified: Secondary | ICD-10-CM | POA: Diagnosis not present

## 2020-10-20 DIAGNOSIS — S72031D Displaced midcervical fracture of right femur, subsequent encounter for closed fracture with routine healing: Secondary | ICD-10-CM | POA: Diagnosis not present

## 2020-10-21 DIAGNOSIS — R29898 Other symptoms and signs involving the musculoskeletal system: Secondary | ICD-10-CM | POA: Diagnosis not present

## 2020-10-21 DIAGNOSIS — Z9181 History of falling: Secondary | ICD-10-CM | POA: Diagnosis not present

## 2020-10-21 DIAGNOSIS — R2681 Unsteadiness on feet: Secondary | ICD-10-CM | POA: Diagnosis not present

## 2020-10-21 DIAGNOSIS — M6281 Muscle weakness (generalized): Secondary | ICD-10-CM | POA: Diagnosis not present

## 2020-10-21 DIAGNOSIS — S72011D Unspecified intracapsular fracture of right femur, subsequent encounter for closed fracture with routine healing: Secondary | ICD-10-CM | POA: Diagnosis not present

## 2020-10-21 DIAGNOSIS — Z96641 Presence of right artificial hip joint: Secondary | ICD-10-CM | POA: Diagnosis not present

## 2020-10-22 DIAGNOSIS — G4700 Insomnia, unspecified: Secondary | ICD-10-CM | POA: Diagnosis not present

## 2020-10-22 DIAGNOSIS — F419 Anxiety disorder, unspecified: Secondary | ICD-10-CM | POA: Diagnosis not present

## 2020-10-22 DIAGNOSIS — F329 Major depressive disorder, single episode, unspecified: Secondary | ICD-10-CM | POA: Diagnosis not present

## 2020-10-22 DIAGNOSIS — F0390 Unspecified dementia without behavioral disturbance: Secondary | ICD-10-CM | POA: Diagnosis not present

## 2020-10-23 DIAGNOSIS — S72011D Unspecified intracapsular fracture of right femur, subsequent encounter for closed fracture with routine healing: Secondary | ICD-10-CM | POA: Diagnosis not present

## 2020-10-23 DIAGNOSIS — R2681 Unsteadiness on feet: Secondary | ICD-10-CM | POA: Diagnosis not present

## 2020-10-23 DIAGNOSIS — Z9181 History of falling: Secondary | ICD-10-CM | POA: Diagnosis not present

## 2020-10-23 DIAGNOSIS — R29898 Other symptoms and signs involving the musculoskeletal system: Secondary | ICD-10-CM | POA: Diagnosis not present

## 2020-10-23 DIAGNOSIS — Z96641 Presence of right artificial hip joint: Secondary | ICD-10-CM | POA: Diagnosis not present

## 2020-10-23 DIAGNOSIS — M6281 Muscle weakness (generalized): Secondary | ICD-10-CM | POA: Diagnosis not present

## 2020-10-26 DIAGNOSIS — Z96641 Presence of right artificial hip joint: Secondary | ICD-10-CM | POA: Diagnosis not present

## 2020-10-26 DIAGNOSIS — S72011D Unspecified intracapsular fracture of right femur, subsequent encounter for closed fracture with routine healing: Secondary | ICD-10-CM | POA: Diagnosis not present

## 2020-10-26 DIAGNOSIS — R29898 Other symptoms and signs involving the musculoskeletal system: Secondary | ICD-10-CM | POA: Diagnosis not present

## 2020-10-26 DIAGNOSIS — M6281 Muscle weakness (generalized): Secondary | ICD-10-CM | POA: Diagnosis not present

## 2020-10-26 DIAGNOSIS — R2681 Unsteadiness on feet: Secondary | ICD-10-CM | POA: Diagnosis not present

## 2020-10-26 DIAGNOSIS — Z9181 History of falling: Secondary | ICD-10-CM | POA: Diagnosis not present

## 2020-10-27 DIAGNOSIS — S72011D Unspecified intracapsular fracture of right femur, subsequent encounter for closed fracture with routine healing: Secondary | ICD-10-CM | POA: Diagnosis not present

## 2020-10-27 DIAGNOSIS — R29898 Other symptoms and signs involving the musculoskeletal system: Secondary | ICD-10-CM | POA: Diagnosis not present

## 2020-10-27 DIAGNOSIS — Z9181 History of falling: Secondary | ICD-10-CM | POA: Diagnosis not present

## 2020-10-27 DIAGNOSIS — M6281 Muscle weakness (generalized): Secondary | ICD-10-CM | POA: Diagnosis not present

## 2020-10-27 DIAGNOSIS — R2681 Unsteadiness on feet: Secondary | ICD-10-CM | POA: Diagnosis not present

## 2020-10-27 DIAGNOSIS — Z96641 Presence of right artificial hip joint: Secondary | ICD-10-CM | POA: Diagnosis not present

## 2020-10-27 DIAGNOSIS — Z23 Encounter for immunization: Secondary | ICD-10-CM | POA: Diagnosis not present

## 2020-10-28 DIAGNOSIS — M6281 Muscle weakness (generalized): Secondary | ICD-10-CM | POA: Diagnosis not present

## 2020-10-28 DIAGNOSIS — Z96641 Presence of right artificial hip joint: Secondary | ICD-10-CM | POA: Diagnosis not present

## 2020-10-28 DIAGNOSIS — R2681 Unsteadiness on feet: Secondary | ICD-10-CM | POA: Diagnosis not present

## 2020-10-28 DIAGNOSIS — Z9181 History of falling: Secondary | ICD-10-CM | POA: Diagnosis not present

## 2020-10-28 DIAGNOSIS — S72011D Unspecified intracapsular fracture of right femur, subsequent encounter for closed fracture with routine healing: Secondary | ICD-10-CM | POA: Diagnosis not present

## 2020-10-28 DIAGNOSIS — R29898 Other symptoms and signs involving the musculoskeletal system: Secondary | ICD-10-CM | POA: Diagnosis not present

## 2020-10-29 DIAGNOSIS — R29898 Other symptoms and signs involving the musculoskeletal system: Secondary | ICD-10-CM | POA: Diagnosis not present

## 2020-10-29 DIAGNOSIS — R2681 Unsteadiness on feet: Secondary | ICD-10-CM | POA: Diagnosis not present

## 2020-10-29 DIAGNOSIS — M6281 Muscle weakness (generalized): Secondary | ICD-10-CM | POA: Diagnosis not present

## 2020-10-29 DIAGNOSIS — Z96641 Presence of right artificial hip joint: Secondary | ICD-10-CM | POA: Diagnosis not present

## 2020-10-29 DIAGNOSIS — S72011D Unspecified intracapsular fracture of right femur, subsequent encounter for closed fracture with routine healing: Secondary | ICD-10-CM | POA: Diagnosis not present

## 2020-10-29 DIAGNOSIS — Z9181 History of falling: Secondary | ICD-10-CM | POA: Diagnosis not present

## 2020-10-30 DIAGNOSIS — M6281 Muscle weakness (generalized): Secondary | ICD-10-CM | POA: Diagnosis not present

## 2020-10-30 DIAGNOSIS — Z96641 Presence of right artificial hip joint: Secondary | ICD-10-CM | POA: Diagnosis not present

## 2020-10-30 DIAGNOSIS — Z9181 History of falling: Secondary | ICD-10-CM | POA: Diagnosis not present

## 2020-10-30 DIAGNOSIS — S72011D Unspecified intracapsular fracture of right femur, subsequent encounter for closed fracture with routine healing: Secondary | ICD-10-CM | POA: Diagnosis not present

## 2020-10-30 DIAGNOSIS — R2681 Unsteadiness on feet: Secondary | ICD-10-CM | POA: Diagnosis not present

## 2020-10-30 DIAGNOSIS — R29898 Other symptoms and signs involving the musculoskeletal system: Secondary | ICD-10-CM | POA: Diagnosis not present

## 2020-11-02 DIAGNOSIS — R29898 Other symptoms and signs involving the musculoskeletal system: Secondary | ICD-10-CM | POA: Diagnosis not present

## 2020-11-02 DIAGNOSIS — M6281 Muscle weakness (generalized): Secondary | ICD-10-CM | POA: Diagnosis not present

## 2020-11-02 DIAGNOSIS — Z96641 Presence of right artificial hip joint: Secondary | ICD-10-CM | POA: Diagnosis not present

## 2020-11-02 DIAGNOSIS — R2681 Unsteadiness on feet: Secondary | ICD-10-CM | POA: Diagnosis not present

## 2020-11-02 DIAGNOSIS — S72011D Unspecified intracapsular fracture of right femur, subsequent encounter for closed fracture with routine healing: Secondary | ICD-10-CM | POA: Diagnosis not present

## 2020-11-02 DIAGNOSIS — Z9181 History of falling: Secondary | ICD-10-CM | POA: Diagnosis not present

## 2020-11-03 DIAGNOSIS — S72011D Unspecified intracapsular fracture of right femur, subsequent encounter for closed fracture with routine healing: Secondary | ICD-10-CM | POA: Diagnosis not present

## 2020-11-03 DIAGNOSIS — R29898 Other symptoms and signs involving the musculoskeletal system: Secondary | ICD-10-CM | POA: Diagnosis not present

## 2020-11-03 DIAGNOSIS — M6281 Muscle weakness (generalized): Secondary | ICD-10-CM | POA: Diagnosis not present

## 2020-11-03 DIAGNOSIS — Z96641 Presence of right artificial hip joint: Secondary | ICD-10-CM | POA: Diagnosis not present

## 2020-11-03 DIAGNOSIS — R2681 Unsteadiness on feet: Secondary | ICD-10-CM | POA: Diagnosis not present

## 2020-11-03 DIAGNOSIS — Z9181 History of falling: Secondary | ICD-10-CM | POA: Diagnosis not present

## 2020-11-04 DIAGNOSIS — S72011D Unspecified intracapsular fracture of right femur, subsequent encounter for closed fracture with routine healing: Secondary | ICD-10-CM | POA: Diagnosis not present

## 2020-11-04 DIAGNOSIS — R2681 Unsteadiness on feet: Secondary | ICD-10-CM | POA: Diagnosis not present

## 2020-11-04 DIAGNOSIS — Z9181 History of falling: Secondary | ICD-10-CM | POA: Diagnosis not present

## 2020-11-04 DIAGNOSIS — Z96641 Presence of right artificial hip joint: Secondary | ICD-10-CM | POA: Diagnosis not present

## 2020-11-04 DIAGNOSIS — M6281 Muscle weakness (generalized): Secondary | ICD-10-CM | POA: Diagnosis not present

## 2020-11-04 DIAGNOSIS — R29898 Other symptoms and signs involving the musculoskeletal system: Secondary | ICD-10-CM | POA: Diagnosis not present

## 2020-11-05 ENCOUNTER — Telehealth: Payer: Self-pay

## 2020-11-05 ENCOUNTER — Non-Acute Institutional Stay (SKILLED_NURSING_FACILITY): Payer: Medicare Other | Admitting: Orthopedic Surgery

## 2020-11-05 ENCOUNTER — Encounter: Payer: Self-pay | Admitting: Orthopedic Surgery

## 2020-11-05 DIAGNOSIS — D696 Thrombocytopenia, unspecified: Secondary | ICD-10-CM | POA: Diagnosis not present

## 2020-11-05 DIAGNOSIS — G309 Alzheimer's disease, unspecified: Secondary | ICD-10-CM

## 2020-11-05 DIAGNOSIS — Z8781 Personal history of (healed) traumatic fracture: Secondary | ICD-10-CM

## 2020-11-05 DIAGNOSIS — Z96641 Presence of right artificial hip joint: Secondary | ICD-10-CM | POA: Diagnosis not present

## 2020-11-05 DIAGNOSIS — F339 Major depressive disorder, recurrent, unspecified: Secondary | ICD-10-CM | POA: Diagnosis not present

## 2020-11-05 DIAGNOSIS — Z9181 History of falling: Secondary | ICD-10-CM | POA: Diagnosis not present

## 2020-11-05 DIAGNOSIS — F0280 Dementia in other diseases classified elsewhere without behavioral disturbance: Secondary | ICD-10-CM

## 2020-11-05 DIAGNOSIS — S0003XA Contusion of scalp, initial encounter: Secondary | ICD-10-CM

## 2020-11-05 DIAGNOSIS — K5901 Slow transit constipation: Secondary | ICD-10-CM | POA: Diagnosis not present

## 2020-11-05 DIAGNOSIS — E785 Hyperlipidemia, unspecified: Secondary | ICD-10-CM

## 2020-11-05 DIAGNOSIS — R29898 Other symptoms and signs involving the musculoskeletal system: Secondary | ICD-10-CM | POA: Diagnosis not present

## 2020-11-05 DIAGNOSIS — M545 Low back pain, unspecified: Secondary | ICD-10-CM | POA: Diagnosis not present

## 2020-11-05 DIAGNOSIS — S72011D Unspecified intracapsular fracture of right femur, subsequent encounter for closed fracture with routine healing: Secondary | ICD-10-CM | POA: Diagnosis not present

## 2020-11-05 DIAGNOSIS — R54 Age-related physical debility: Secondary | ICD-10-CM | POA: Diagnosis not present

## 2020-11-05 DIAGNOSIS — M6281 Muscle weakness (generalized): Secondary | ICD-10-CM | POA: Diagnosis not present

## 2020-11-05 DIAGNOSIS — I10 Essential (primary) hypertension: Secondary | ICD-10-CM | POA: Diagnosis not present

## 2020-11-05 DIAGNOSIS — D500 Iron deficiency anemia secondary to blood loss (chronic): Secondary | ICD-10-CM

## 2020-11-05 DIAGNOSIS — E538 Deficiency of other specified B group vitamins: Secondary | ICD-10-CM

## 2020-11-05 DIAGNOSIS — R2681 Unsteadiness on feet: Secondary | ICD-10-CM | POA: Diagnosis not present

## 2020-11-05 NOTE — Telephone Encounter (Signed)
Disregard encounter, it appears pt have transferred care to Man Mast, NP.

## 2020-11-05 NOTE — Progress Notes (Signed)
Location:   [facility] Room Number: 30-A Place of Service:  SNF (31) Provider:  Windell Moulding, NP    Patient Care Team: Mast, Man X, NP as PCP - General (Internal Medicine)  Extended Emergency Contact Information Primary Emergency Contact: Meganne, Rita Address: [address]          [place], [place] [address] [place] Phone: [phone] Relation: Spouse Secondary Emergency Contact: [facility] Phone: [phone] Relation: Daughter  Code Status:  FULL CODE Goals of care: Advanced Directive information Advanced Directives 11/05/2020  Does Patient Have a Medical Advance Directive? No  Type of Advance Directive -  Does patient want to make changes to medical advance directive? No - Patient declined  Copy of [place] in Chart? -  Would patient like information on creating a medical advance directive? -     Chief Complaint  Patient presents with   Immunizations    Discuss the need for Influenza vaccine.   Acute Visit    HPI:  Pt is a 80 y.o. female seen today for acute visit due to fall with head laceration.   She currently resides on the skilled nursing unit at [facility]. Past medical history includes: HTN, PVD, constipation, Alzheimer's, OA, osteoporosis, thrombocytopenia, HLD, depression, and weakness.   09/28 she was found by staff with blood dripping from her head. She reported slipping and hitting the posterior crown of her head while changing her clothes. Nursing staff had to apply pressure to stop bleeding from head wound. She was alert and oriented after injury, vitals signs stable, normal neuro checks. She did not want to go to ED for further evaluation. This morning she reports intermittent headache. Headache began shortly after incident. Described as aching/tenderness to the back of her head. It resolved overnight and then came back early in this morning. She denies pain or headache at  this time. She denies blurred vision, nausea, and vomiting.   Right hip hemiarthroplasty- s/p hospitalization 07/28-08/01 due to mechanical fall, ambulating with walker, denies pain Alzheimer's- CT head 09/03/2020 indicated chronic white matter disease, MMSE 28/30 08/2020, no recent behavioral outbursts HTN- BUN/creat 18/0.5 09/22/2020, not on medication HLD- LDL 118 10/2019 Anemia- hgb 11.3 09/22/2020 Thrombocytopenia- platelets 342 09/22/2020 Vit B12 deficiency- 499 05/2020 Constipation- LBM 09/28, colace, senna, miralax Depression- denies increased blues, lexapro and Wellbutrin daily Chronic back pain- denies increased pain, scheduled tylenol and gabapentin daily  Recent blood pressures:  09/29- 110/60  09/28- 108/61, 110/75  09/27- 118/59  Recent weights:  09/23- 85 lbs  09/01- 80.9 lbs  08/04- 84.4 lbs            Past Medical History:  Diagnosis Date   Anxiety    no meds   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   Depression    years ago   Headache    sinus headaches   Lipoma of arm    Right   Medical history non-contributory    Osteoporosis    SVD (spontaneous vaginal delivery)    x 2   Varicose vein    Vitamin D deficiency    Past Surgical History:  Procedure Laterality Date   ANTERIOR APPROACH HEMI HIP ARTHROPLASTY Right 09/04/2020   Procedure: ANTERIOR APPROACH HEMI HIP ARTHROPLASTY;  Surgeon: Rod Can, MD;  Location: [facility];  Service: Orthopedics;  Laterality: Right;   [place]   COLONOSCOPY  GANGLION CYST EXCISION     x2 left and right arm   HYSTEROSCOPY WITH D & C N/A 07/15/2013   Procedure: DILATATION AND CURETTAGE /HYSTEROSCOPY;  Surgeon: Lovenia Kim, MD;  Location: [facility];  Service: Gynecology;  Laterality: N/A;   KYPHOPLASTY Bilateral 05/04/2020   Procedure: KYPHOPLASTY THORACIC TWELVE;  Surgeon: Vallarie Mare, MD;  Location: [facility];  Service: Neurosurgery;  Laterality: Bilateral;    MANDIBLE SURGERY  1954   WISDOM TOOTH EXTRACTION      Allergies  Allergen Reactions   Alendronate Sodium Other (See Comments)    Leg cramps and "Allergic," per MAR   Risedronate Sodium Other (See Comments)    Caused the patient to be achy and is "Allergic," per MAR   Tramadol Other (See Comments)    Caused the patient to feel badly    Keflex [Cephalexin] Other (See Comments)    Possible rash    Allergies as of 11/05/2020       Reactions   Alendronate Sodium Other (See Comments)   Leg cramps and "Allergic," per MAR   Risedronate Sodium Other (See Comments)   Caused the patient to be achy and is "Allergic," per MAR   Tramadol Other (See Comments)   Caused the patient to feel badly    Keflex [cephalexin] Other (See Comments)   Possible rash        Medication List        Accurate as of November 05, 2020  9:09 PM. If you have any questions, ask your nurse or doctor.          acetaminophen 500 MG tablet Commonly known as: TYLENOL Take 1,000 mg by mouth every 6 (six) hours as needed for moderate pain, mild pain, headache or fever.   ASPIRIN 81 PO Take 1 tablet by mouth 2 (two) times daily.   buPROPion 150 MG 24 hr tablet Commonly known as: WELLBUTRIN XL Take 150 mg by mouth daily.   CALCIUM-CARB 600 PO Take 1 tablet by mouth 2 (two) times daily.   cetirizine 10 MG tablet Commonly known as: ZyrTEC Allergy Take 1 tablet (10 mg total) by mouth daily.   docusate sodium 100 MG capsule Commonly known as: COLACE Take 100 mg by mouth at bedtime.   escitalopram 10 MG tablet Commonly known as: LEXAPRO Take 10 mg by mouth every evening.   gabapentin 100 MG capsule Commonly known as: NEURONTIN Take 1 capsule (100 mg total) by mouth 3 (three) times daily.   Melatonin 10 MG Tabs Take 10 mg by mouth at bedtime.   polyethylene glycol 17 g packet Commonly known as: MIRALAX / GLYCOLAX Take 17 g by mouth daily as needed for moderate constipation or mild  constipation.   PRESERVISION AREDS 2 PO Take 1 capsule by mouth 2 (two) times daily.   PRO-STAT AWC PO Take by mouth.   RESOURCE 2.0 PO Take 180 mLs by mouth daily at 2 PM.   Salonpas 3.02-13-08 % Ptch Generic drug: Camphor-Menthol-Methyl Sal Apply 1 patch topically See admin instructions. Apply 1 patch to the lower back and remove (at most) after 8 hours- once a day   senna-docusate 8.6-50 MG tablet Commonly known as: Senokot-S Take 1 tablet by mouth daily.   VITAMIN B12-FOLIC ACID PO Place 1 lozenge under the tongue in the morning.   Vitamin D3 50 MCG (2000 UT) capsule Take 1 capsule (2,000 Units total) by mouth daily.        Review of Systems  Constitutional:  Negative for activity change, appetite change, chills, fatigue and fever.  HENT:  Negative for dental problem, hearing loss and trouble swallowing.   Eyes:  Negative for visual disturbance.  Respiratory:  Negative for cough, shortness of breath and wheezing.   Cardiovascular:  Negative for chest pain and leg swelling.  Gastrointestinal:  Positive for constipation. Negative for abdominal distention, abdominal pain, diarrhea, nausea and vomiting.  Genitourinary:  Negative for dysuria, frequency and hematuria.  Musculoskeletal:  Positive for arthralgias, back pain and gait problem.  Skin:  Positive for wound.  Neurological:  Positive for weakness and headaches. Negative for dizziness and light-headedness.  Psychiatric/Behavioral:  Positive for confusion. Negative for dysphoric mood and sleep disturbance. The patient is not nervous/anxious.    Immunization History  Administered Date(s) Administered   DT (Pediatric) 04/10/2002   Fluad Quad(high Dose 65+) 10/28/2019   Influenza Split 03/08/2011, 11/17/2011   Influenza Whole 01/09/2007, 11/05/2008, 11/04/2009   Influenza, High Dose Seasonal PF 12/07/2015, 09/29/2016, 11/09/2017, 09/27/2018, 10/28/2019   Influenza-Unspecified 10/30/2012, 11/29/2013, 12/02/2014    Moderna Sars-Covid-2 Vaccination 02/08/2019, 03/11/2019   PFIZER(Purple Top)SARS-COV-2 Vaccination 10/09/2019   PPD Test 12/22/2015, 01/05/2016   Pneumococcal Conjugate-13 07/17/2014   Pneumococcal Polysaccharide-23 08/15/2005, 09/25/2015   Tetanus 12/24/2012   Unspecified SARS-COV-2 Vaccination 10/27/2020   Zoster Recombinat (Shingrix) 06/07/2019, 09/06/2019   Zoster, Live 12/05/2013   Pertinent  Health Maintenance Due  Topic Date Due   INFLUENZA VACCINE  09/07/2020   DEXA SCAN  Completed   Fall Risk  10/10/2019 10/08/2018 10/02/2017 09/29/2016 10/08/2015  Falls in the past year? 0 0 No No No  Comment - - - - Emmi Telephone Survey: data to providers prior to load  Number falls in past yr: 0 0 - - -  Injury with Fall? 0 0 - - -  Risk for fall due to : No Fall Risks - - - -  Follow up Falls evaluation completed - - - -   Functional Status Survey:    Vitals:   11/05/20 1151  BP: 110/60  Pulse: 82  Resp: 16  Temp: (!) 97.2 F (36.2 C)  SpO2: 94%  Weight: 85 lb (38.6 kg)  Height: 5\' 4"  (1.626 m)   Body mass index is 14.59 kg/m. Physical Exam Vitals reviewed.  Constitutional:      General: She is not in acute distress.    Comments: underweight  HENT:     Head: Abrasion present. No raccoon eyes, Battle's sign or masses.     Comments: 3-4 cm raised area to parietal/occipital portion of head, no bruising, some abrasions over area, no active bleeding, area tender to touch.     Right Ear: There is no impacted cerumen.     Left Ear: There is no impacted cerumen.     Nose: Nose normal.     Mouth/Throat:     Mouth: Mucous membranes are moist.  Eyes:     Extraocular Movements: Extraocular movements intact.     Right eye: Normal extraocular motion and no nystagmus.     Left eye: Normal extraocular motion and no nystagmus.     Pupils: Pupils are equal, round, and reactive to light.  Neck:     Vascular: No carotid bruit.  Cardiovascular:     Rate and Rhythm: Normal rate and  regular rhythm.     Pulses: Normal pulses.     Heart sounds: Normal heart sounds. No murmur heard. Pulmonary:     Effort: Pulmonary effort is normal. No respiratory distress.  Breath sounds: Normal breath sounds. No wheezing.  Abdominal:     General: Bowel sounds are normal. There is no distension.     Palpations: Abdomen is soft.     Tenderness: There is no abdominal tenderness.  Musculoskeletal:     Cervical back: Normal range of motion and neck supple. No rigidity or tenderness.     Right lower leg: No edema.     Left lower leg: No edema.  Lymphadenopathy:     Cervical: No cervical adenopathy.  Skin:    General: Skin is warm and dry.     Capillary Refill: Capillary refill takes less than 2 seconds.  Neurological:     General: No focal deficit present.     Mental Status: She is alert. Mental status is at baseline.     Motor: No weakness.     Gait: Gait normal.     Comments: walker  Psychiatric:        Mood and Affect: Mood normal.        Behavior: Behavior normal.    Labs reviewed: Recent Labs    08/18/20 2330 08/19/20 0429 09/03/20 0716 09/04/20 1917 09/05/20 0537 09/06/20 0533 09/18/20 0000 09/22/20 0000  NA  --  137 136  --  133* 135 138 138  K  --  3.6 3.7  --  3.2* 3.5 4.0 4.1  CL  --  104 100  --  101 102 101 100  CO2  --  24  --   --  25 27 30* 27*  GLUCOSE  --  80 86  --  110* 95  --   --   BUN  --  10 16  --  23 18 17 18   CREATININE  --  <0.30* 0.50   < > 0.59 0.40* 0.4* 0.5  CALCIUM  --  8.6*  --   --  8.6* 8.7* 8.3* 8.5*  MG 2.2 2.1  --   --   --   --   --   --   PHOS  --  3.1  --   --   --   --   --   --    < > = values in this interval not displayed.   Recent Labs    07/09/20 1830 08/18/20 1622 08/19/20 0429 09/18/20 0000 09/22/20 0000  AST 18 25 22 21 26   ALT 15 18 18 22 28   ALKPHOS 73 91 86 100 112  BILITOT 0.7 0.9 1.0  --   --   PROT 5.5* 6.3* 6.4*  --   --   ALBUMIN 3.1* 3.7 3.9 3.1* 3.0*   Recent Labs    09/03/20 0700  09/03/20 0716 09/05/20 0537 09/06/20 0533 09/07/20 0400 09/18/20 0000 09/22/20 0000  WBC 12.4*   < > 11.8* 9.6 8.1 6.5 5.7  NEUTROABS 11.0*  --   --   --   --  4,979.00 4,252.00  HGB 13.4   < > 11.4* 11.1* 10.1* 11.1* 11.3*  HCT 41.5   < > 34.3* 34.1* 30.1* 34* 35*  MCV 94.7   < > 93.0 93.2 92.0  --   --   PLT 165   < > 134* 121* 136*  --  342   < > = values in this interval not displayed.   Lab Results  Component Value Date   TSH 2.12 05/21/2020   No results found for: HGBA1C Lab Results  Component Value Date   CHOL 201 (H) 11/01/2019  HDL 69 11/01/2019   LDLCALC 118 (H) 11/01/2019   LDLDIRECT 135.4 12/26/2008   TRIG 52 11/01/2019   CHOLHDL 2.9 11/01/2019    Significant Diagnostic Results in last 30 days:  No results found.  Assessment/Plan 1. Hematoma of parietal scalp - due to fall - reports intermittent headache- suspect pain from hematoma - 3-4 cm raised area with some skin breakdown, no active bleeding - she does not want to go to ED at this time - Discussed reporting symptoms of headache, nausea, vomiting to nurse- send to ED - hold aspirin 09/29, 09/30, and 10/01  2. S/P right hip fracture - resolved - ambulating with walker without pain  3. Alzheimer disease ([place]) - CT head 08/2020 noted chronic white matter disease - cont skilled nursing care  4. Hypertension, unspecified type - controlled without medication  5. Hyperlipidemia, unspecified hyperlipidemia type - LDL 118, 2021  6. Blood loss anemia - Hgb 11.3 09/22/2020  7. Thrombocytopenia ([place]) - resolved  8. Vitamin B12 deficiency - B12 level 499 05/2020  9. Slow transit constipation - LBM 09/28 - cont colace,senna and miralax  10. Depression, recurrent ([place]) - stable with lexapro and Wellbutrin  11. Acute bilateral low back pain without sciatica - stable with tylenol and gabapentin  12. Frailty - BMI 14.59 - cont falls safety precautions    Family/ staff Communication: plan  discussed with patient and nurse  Labs/tests ordered:  none

## 2020-11-05 NOTE — Telephone Encounter (Signed)
Evenity VOB initiated via [email or web address]  Last OV:  Next OV:  Last Evenity inj: 07/28/20 Next Evenity inj DUE: 08/28/20

## 2020-11-06 DIAGNOSIS — M6281 Muscle weakness (generalized): Secondary | ICD-10-CM | POA: Diagnosis not present

## 2020-11-06 DIAGNOSIS — R2681 Unsteadiness on feet: Secondary | ICD-10-CM | POA: Diagnosis not present

## 2020-11-06 DIAGNOSIS — S72011D Unspecified intracapsular fracture of right femur, subsequent encounter for closed fracture with routine healing: Secondary | ICD-10-CM | POA: Diagnosis not present

## 2020-11-06 DIAGNOSIS — R29898 Other symptoms and signs involving the musculoskeletal system: Secondary | ICD-10-CM | POA: Diagnosis not present

## 2020-11-06 DIAGNOSIS — Z9181 History of falling: Secondary | ICD-10-CM | POA: Diagnosis not present

## 2020-11-06 DIAGNOSIS — Z96641 Presence of right artificial hip joint: Secondary | ICD-10-CM | POA: Diagnosis not present

## 2020-11-09 ENCOUNTER — Non-Acute Institutional Stay (SKILLED_NURSING_FACILITY): Payer: Medicare Other | Admitting: Nurse Practitioner

## 2020-11-09 ENCOUNTER — Encounter: Payer: Self-pay | Admitting: Nurse Practitioner

## 2020-11-09 DIAGNOSIS — F0280 Dementia in other diseases classified elsewhere without behavioral disturbance: Secondary | ICD-10-CM

## 2020-11-09 DIAGNOSIS — G309 Alzheimer's disease, unspecified: Secondary | ICD-10-CM

## 2020-11-09 DIAGNOSIS — I10 Essential (primary) hypertension: Secondary | ICD-10-CM | POA: Diagnosis not present

## 2020-11-09 DIAGNOSIS — Z96641 Presence of right artificial hip joint: Secondary | ICD-10-CM | POA: Diagnosis not present

## 2020-11-09 DIAGNOSIS — F339 Major depressive disorder, recurrent, unspecified: Secondary | ICD-10-CM | POA: Diagnosis not present

## 2020-11-09 DIAGNOSIS — S72011D Unspecified intracapsular fracture of right femur, subsequent encounter for closed fracture with routine healing: Secondary | ICD-10-CM | POA: Diagnosis not present

## 2020-11-09 DIAGNOSIS — K5901 Slow transit constipation: Secondary | ICD-10-CM

## 2020-11-09 DIAGNOSIS — R2681 Unsteadiness on feet: Secondary | ICD-10-CM | POA: Diagnosis not present

## 2020-11-09 DIAGNOSIS — D500 Iron deficiency anemia secondary to blood loss (chronic): Secondary | ICD-10-CM | POA: Diagnosis not present

## 2020-11-09 DIAGNOSIS — E538 Deficiency of other specified B group vitamins: Secondary | ICD-10-CM | POA: Diagnosis not present

## 2020-11-09 DIAGNOSIS — E785 Hyperlipidemia, unspecified: Secondary | ICD-10-CM | POA: Diagnosis not present

## 2020-11-09 DIAGNOSIS — R443 Hallucinations, unspecified: Secondary | ICD-10-CM | POA: Diagnosis not present

## 2020-11-09 DIAGNOSIS — J3089 Other allergic rhinitis: Secondary | ICD-10-CM

## 2020-11-09 DIAGNOSIS — M545 Low back pain, unspecified: Secondary | ICD-10-CM

## 2020-11-09 DIAGNOSIS — S0003XD Contusion of scalp, subsequent encounter: Secondary | ICD-10-CM | POA: Insufficient documentation

## 2020-11-09 DIAGNOSIS — M810 Age-related osteoporosis without current pathological fracture: Secondary | ICD-10-CM | POA: Diagnosis not present

## 2020-11-09 DIAGNOSIS — E559 Vitamin D deficiency, unspecified: Secondary | ICD-10-CM | POA: Diagnosis not present

## 2020-11-09 DIAGNOSIS — R29898 Other symptoms and signs involving the musculoskeletal system: Secondary | ICD-10-CM | POA: Diagnosis not present

## 2020-11-09 DIAGNOSIS — Z9181 History of falling: Secondary | ICD-10-CM | POA: Diagnosis not present

## 2020-11-09 DIAGNOSIS — M6281 Muscle weakness (generalized): Secondary | ICD-10-CM | POA: Diagnosis not present

## 2020-11-09 NOTE — Assessment & Plan Note (Signed)
takes Vit d daily

## 2020-11-09 NOTE — Assessment & Plan Note (Signed)
Hgb 11.3 09/22/20, post op.

## 2020-11-09 NOTE — Assessment & Plan Note (Addendum)
reported the patient's hallucination, seeing little boys in her room, water coming from the ceiling in her room, left occiput hematoma sustained from fall 11/05/20, oozing serosanguinous drainage, no new focal neurological symptoms.  Update CBC/diff, CMP/eGFR. Dr. Lyndel Safe: CT head to evaluate further.  11/10/20 CT head small left posterior scalp hematoma, no acute intracranial abnormality

## 2020-11-09 NOTE — Assessment & Plan Note (Signed)
stable, T11, L1 compression fx, takes Tylenol, Gabapentin 

## 2020-11-09 NOTE — Assessment & Plan Note (Signed)
off Amlodipine. Bun/creat 18/0.5 09/22/20

## 2020-11-09 NOTE — Assessment & Plan Note (Signed)
left occiput hematoma sustained from fall 11/05/20, oozing serosanguinous drainage, no new focal neurological symptoms, apply protective dressing, observe for new focal neurological symptoms.

## 2020-11-09 NOTE — Assessment & Plan Note (Addendum)
takes Escitalopram, Wellbutrin.  HPOA requested to taper off Wellbutrin: will decrease to qod x 1 week, then x2 x1 week, then dc.

## 2020-11-09 NOTE — Assessment & Plan Note (Addendum)
off Evenity-2nd to cost.

## 2020-11-09 NOTE — Assessment & Plan Note (Signed)
Vit B12 499, TSH 2.12 05/21/20, continue SNF [facility] for supportive care

## 2020-11-09 NOTE — Assessment & Plan Note (Signed)
takes Colace, Senokot S, MiraLax  

## 2020-11-09 NOTE — Assessment & Plan Note (Signed)
diet, LDL 118 11/01/19

## 2020-11-09 NOTE — Assessment & Plan Note (Signed)
takes Zyrtec. 

## 2020-11-09 NOTE — Progress Notes (Addendum)
Location:   [facility] Room Number: 30 A Place of Service:  SNF (31) Provider: Lennie Odor Holley Kocurek NP  Mikisha Roseland X, NP  Patient Care Team: Promiss Labarbera X, NP as PCP - General (Internal Medicine)  Extended Emergency Contact Information Primary Emergency Contact: Jamyiah, Labella Address: [address]          [place], [place] [address] [place] Phone: [phone] Relation: Spouse Secondary Emergency Contact: [facility] Phone: [phone] Relation: Daughter  Code Status: DNR Goals of care: Advanced Directive information Advanced Directives 11/05/2020  Does Patient Have a Medical Advance Directive? No  Type of Advance Directive -  Does patient want to make changes to medical advance directive? No - Patient declined  Copy of [place] in Chart? -  Would patient like information on creating a medical advance directive? -     Chief Complaint  Patient presents with   Acute Visit    Hallucinations     HPI:  Pt is a 81 y.o. female seen today for an acute visit for reported the patient's hallucination, seeing little boys in her room, water coming from the ceiling in her room, left occiput hematoma sustained from fall 11/05/20, oozing serosanguinous drainage, no new focal neurological symptoms.    08/18/20-08/20/20 for AMS/lethargy, didn't tolerate Mirtazapine for appetite. CT head/abd/pelvis showed stable T11, L1 compression fx. 09/03/20-09/07/20 for s/p R hip hemiarthroplasty due to a mechanical fall, healed.              OP, off Evenity 2nd to cost              Anemia, Hgb 11.3 09/22/20, post op.              HTN, off Amlodipine. Bun/creat 18/0.5 09/22/20             Constipation, takes Colace, Senokot S, MiraLax.              Alzheimer's Dementia, Vit B12 499, TSH 2.12 05/21/20             Vit B12 deficiency, Vit B12 level 499 05/21/20, takes Vit B12             Vitamin D deficiency, takes Vit d daily             Allergic rhinitis, takes  Zyrtec.             Hyperlipidemia, diet, LDL 118 11/01/19             Depression, takes Escitalopram, decreased Wellbutrin 09/29/20             Chronic back pain, stable, T11, L1 compression fx, takes Tylenol, Gabapentin             Weight loss/adult failure to thrive, didn't tolerate Mirtazapine.             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   Depression    years ago   Headache    sinus headaches   Lipoma of arm    Right   Medical history non-contributory    Osteoporosis    SVD (spontaneous vaginal delivery)    x 2   Varicose vein    Vitamin D deficiency    Past Surgical History:  Procedure Laterality Date   ANTERIOR APPROACH HEMI  HIP ARTHROPLASTY Right 09/04/2020   Procedure: ANTERIOR APPROACH HEMI HIP ARTHROPLASTY;  Surgeon: Rod Can, MD;  Location: [facility];  Service: Orthopedics;  Laterality: Right;   [place]   COLONOSCOPY     GANGLION CYST EXCISION     x2 left and right arm   HYSTEROSCOPY WITH D & C N/A 07/15/2013   Procedure: DILATATION AND CURETTAGE /HYSTEROSCOPY;  Surgeon: Lovenia Kim, MD;  Location: [facility];  Service: Gynecology;  Laterality: N/A;   KYPHOPLASTY Bilateral 05/04/2020   Procedure: KYPHOPLASTY THORACIC TWELVE;  Surgeon: Vallarie Mare, MD;  Location: [facility];  Service: Neurosurgery;  Laterality: Bilateral;   MANDIBLE SURGERY  1954   WISDOM TOOTH EXTRACTION      Allergies  Allergen Reactions   Alendronate Sodium Other (See Comments)    Leg cramps and "Allergic," per MAR   Risedronate Sodium Other (See Comments)    Caused the patient to be achy and is "Allergic," per MAR   Tramadol Other (See Comments)    Caused the patient to feel badly    Keflex [Cephalexin] Other (See Comments)    Possible rash    Allergies as of 11/09/2020       Reactions   Alendronate Sodium Other (See Comments)   Leg  cramps and "Allergic," per MAR   Risedronate Sodium Other (See Comments)   Caused the patient to be achy and is "Allergic," per MAR   Tramadol Other (See Comments)   Caused the patient to feel badly    Keflex [cephalexin] Other (See Comments)   Possible rash        Medication List        Accurate as of November 09, 2020 11:59 PM. If you have any questions, ask your nurse or doctor.          STOP taking these medications    PRO-STAT AWC PO Stopped by: Antiono Ettinger X Fines Kimberlin, NP       TAKE these medications    acetaminophen 500 MG tablet Commonly known as: TYLENOL Take 1,000 mg by mouth every 6 (six) hours as needed for moderate pain, mild pain, headache or fever.   ASPIRIN 81 PO Take 1 tablet by mouth 2 (two) times daily. Hold 09/28, 09/29, 10/01   buPROPion 150 MG 24 hr tablet Commonly known as: WELLBUTRIN XL Take 150 mg by mouth daily.   CALCIUM-CARB 600 PO Take 1 tablet by mouth 2 (two) times daily.   cetirizine 10 MG tablet Commonly known as: ZyrTEC Allergy Take 1 tablet (10 mg total) by mouth daily.   docusate sodium 100 MG capsule Commonly known as: COLACE Take 100 mg by mouth at bedtime.   escitalopram 10 MG tablet Commonly known as: LEXAPRO Take 10 mg by mouth every evening.   gabapentin 100 MG capsule Commonly known as: NEURONTIN Take 1 capsule (100 mg total) by mouth 3 (three) times daily.   Melatonin 10 MG Tabs Take 10 mg by mouth at bedtime.   polyethylene glycol 17 g packet Commonly known as: MIRALAX / GLYCOLAX Take 17 g by mouth daily as needed for moderate constipation or mild constipation.   PRESERVISION AREDS 2 PO Take 1 capsule by mouth 2 (two) times daily.   RESOURCE 2.0 PO Take 180 mLs by mouth daily at 2 PM.   Salonpas 3.02-13-08 % Ptch Generic drug: Camphor-Menthol-Methyl Sal Apply 1 patch topically See admin instructions. Apply 1 patch to the lower back and remove (at most) after  8 hours- once a day   senna-docusate 8.6-50 MG  tablet Commonly known as: Senokot-S Take 1 tablet by mouth daily.   VITAMIN B12-FOLIC ACID PO Place 1 lozenge under the tongue in the morning.   Vitamin D3 50 MCG (2000 UT) capsule Take 1 capsule (2,000 Units total) by mouth daily.        Review of Systems  Constitutional:  Negative for activity change, appetite change and fever.       #80Ibs  HENT:  Positive for hearing loss. Negative for congestion and voice change.   Eyes:  Negative for visual disturbance.  Respiratory:  Negative for cough and shortness of breath.   Cardiovascular:  Negative for leg swelling.  Gastrointestinal:  Negative for abdominal pain and constipation.  Genitourinary:  Negative for dysuria and urgency.  Musculoskeletal:  Positive for arthralgias, back pain and gait problem.       R hip pain is minimal.   Skin:  Positive for wound. Negative for color change.  Neurological:  Negative for speech difficulty, weakness and headaches.       Dementia.   Psychiatric/Behavioral:  Positive for hallucinations. Negative for confusion. The patient is not nervous/anxious.    Immunization History  Administered Date(s) Administered   DT (Pediatric) 04/10/2002   Fluad Quad(high Dose 65+) 10/28/2019   Influenza Split 03/08/2011, 11/17/2011   Influenza Whole 01/09/2007, 11/05/2008, 11/04/2009   Influenza, High Dose Seasonal PF 12/07/2015, 09/29/2016, 11/09/2017, 09/27/2018, 10/28/2019   Influenza-Unspecified 10/30/2012, 11/29/2013, 12/02/2014   Moderna Sars-Covid-2 Vaccination 02/08/2019, 03/11/2019   PFIZER(Purple Top)SARS-COV-2 Vaccination 10/09/2019   PPD Test 12/22/2015, 01/05/2016   Pneumococcal Conjugate-13 07/17/2014   Pneumococcal Polysaccharide-23 08/15/2005, 09/25/2015   Tetanus 12/24/2012   Unspecified SARS-COV-2 Vaccination 10/27/2020   Zoster Recombinat (Shingrix) 06/07/2019, 09/06/2019   Zoster, Live 12/05/2013   Pertinent  Health Maintenance Due  Topic Date Due   INFLUENZA VACCINE  09/07/2020    DEXA SCAN  Completed   Fall Risk  10/10/2019 10/08/2018 10/02/2017 09/29/2016 10/08/2015  Falls in the past year? 0 0 No No No  Comment - - - - Emmi Telephone Survey: data to providers prior to load  Number falls in past yr: 0 0 - - -  Injury with Fall? 0 0 - - -  Risk for fall due to : No Fall Risks - - - -  Follow up Falls evaluation completed - - - -   Functional Status Survey:    Vitals:   11/09/20 1616  BP: 136/78  Pulse: 70  Resp: 18  Temp: 97.7 F (36.5 C)  SpO2: 95%  Weight: 82 lb 6.4 oz (37.4 kg)  Height: _0  (1.626 m)   Body mass index is 14.14 kg/m. Physical Exam Vitals and nursing note reviewed.  Constitutional:      Appearance: Normal appearance.  HENT:     Head: Normocephalic.     Nose: Nose normal.     Mouth/Throat:     Mouth: Mucous membranes are moist.  Eyes:     Conjunctiva/sclera: Conjunctivae normal.     Pupils: Pupils are equal, round, and reactive to light.  Cardiovascular:     Rate and Rhythm: Normal rate.     Heart sounds: No murmur heard. Pulmonary:     Effort: Pulmonary effort is normal.     Breath sounds: No rales.  Abdominal:     General: Bowel sounds are normal.     Palpations: Abdomen is soft.     Tenderness: There is no abdominal tenderness.  There is no right CVA tenderness, left CVA tenderness, guarding or rebound.  Musculoskeletal:     Cervical back: Normal range of motion and neck supple.     Right lower leg: No edema.     Left lower leg: No edema.     Comments: Right hip s/p ORIF 09/08/20, healed.   Skin:    General: Skin is warm and dry.     Findings: Bruising present.     Comments: Left occiput hematoma oozing serosanguinous drainage.   Neurological:     General: No focal deficit present.     Mental Status: She is alert. Mental status is at baseline.     Motor: No weakness.     Coordination: Coordination normal.     Gait: Gait abnormal.     Comments: Oriented to self.   Psychiatric:        Mood and Affect: Mood normal.      Comments: Reported seeing little boys and water came down from ceiling in her room 11/08/20    Labs reviewed: Recent Labs    08/18/20 2330 08/19/20 0429 09/03/20 0716 09/04/20 1917 09/05/20 0537 09/06/20 0533 09/18/20 0000 09/22/20 0000  NA  --  137 136  --  133* 135 138 138  K  --  3.6 3.7  --  3.2* 3.5 4.0 4.1  CL  --  104 100  --  101 102 101 100  CO2  --  24  --   --  25 27 30* 27*  GLUCOSE  --  80 86  --  110* 95  --   --   BUN  --  10 16  --  _0 CREATININE  --  <0.30* 0.50   < > 0.59 0.40* 0.4* 0.5  CALCIUM  --  8.6*  --   --  8.6* 8.7* 8.3* 8.5*  MG 2.2 2.1  --   --   --   --   --   --   PHOS  --  3.1  --   --   --   --   --   --    < > = values in this interval not displayed.   Recent Labs    07/09/20 1830 08/18/20 1622 08/19/20 0429 09/18/20 0000 09/22/20 0000  AST _1 ALT _2 ALKPHOS 73 91 86 100 112  BILITOT 0.7 0.9 1.0  --   --   PROT 5.5* 6.3* 6.4*  --   --   ALBUMIN 3.1* 3.7 3.9 3.1* 3.0*   Recent Labs    09/03/20 0700 09/03/20 0716 09/05/20 0537 09/06/20 0533 09/07/20 0400 09/18/20 0000 09/22/20 0000  WBC 12.4*   < > 11.8* 9.6 8.1 6.5 5.7  NEUTROABS 11.0*  --   --   --   --  4,979.00 4,252.00  HGB 13.4   < > 11.4* 11.1* 10.1* 11.1* 11.3*  HCT 41.5   < > 34.3* 34.1* 30.1* 34* 35*  MCV 94.7   < > 93.0 93.2 92.0  --   --   PLT 165   < > 134* 121* 136*  --  342   < > = values in this interval not displayed.   Lab Results  Component Value Date   TSH 2.12 05/21/2020   No results found for: HGBA1C Lab Results  Component Value Date   CHOL 201 (H) 11/01/2019   HDL 69 11/01/2019  LDLCALC 118 (H) 11/01/2019   LDLDIRECT 135.4 12/26/2008   TRIG 52 11/01/2019   CHOLHDL 2.9 11/01/2019    Significant Diagnostic Results in last 30 days:  CT HEAD WO CONTRAST (5MM)  Result Date: 11/10/2020 CLINICAL DATA:  Dizziness after fall last week. EXAM: CT HEAD WITHOUT CONTRAST TECHNIQUE: Contiguous axial images were  obtained from the base of the skull through the vertex without intravenous contrast. COMPARISON:  September 03, 2020. FINDINGS: Brain: Mild chronic ischemic white matter disease is noted. No mass effect or midline shift is noted. Ventricular size is within normal limits. There is no evidence of mass lesion, hemorrhage or acute infarction. Vascular: No hyperdense vessel or unexpected calcification. Skull: Normal. Negative for fracture or focal lesion. Sinuses/Orbits: No acute finding. Other: Small left posterior scalp hematoma is noted. IMPRESSION: Small left posterior scalp hematoma. No acute intracranial abnormality seen. Electronically Signed   By: Marijo Conception M.D.   On: 11/10/2020 14:04    Assessment/Plan: Hallucination reported the patient's hallucination, seeing little boys in her room, water coming from the ceiling in her room, left occiput hematoma sustained from fall 11/05/20, oozing serosanguinous drainage, no new focal neurological symptoms.  Update CBC/diff, CMP/eGFR. Dr. Lyndel Safe: CT head to evaluate further.  11/10/20 CT head small left posterior scalp hematoma, no acute intracranial abnormality  Scalp hematoma, subsequent encounter  left occiput hematoma sustained from fall 11/05/20, oozing serosanguinous drainage, no new focal neurological symptoms, apply protective dressing, observe for new focal neurological symptoms.   Osteoporosis off Evenity-2nd to cost.   Blood loss anemia Hgb 11.3 09/22/20, post op.   HTN (hypertension) off Amlodipine. Bun/creat 18/0.5 09/22/20  Slow transit constipation  takes Colace, Senokot S, MiraLax.   Alzheimer disease ([place])  Vit B12 499, TSH 2.12 05/21/20, continue SN[facility] for supportive care  Vitamin B12 deficiency Vit B12 level 499 05/21/20, takes Vit B12  Vitamin D deficiency  takes Vit d daily  Allergic rhinitis  takes Zyrtec.  Hyperlipidemia diet, LDL 118 11/01/19  Depression, recurrent (HCC) takes Escitalopram, Wellbutrin.  HPOA requested  to taper off Wellbutrin: will decrease to qod x 1 week, then x2 x1 week, then dc.   Low back pain stable, T11, L1 compression fx, takes Tylenol, Gabapentin    Family/ staff Communication: plan of care reviewed with the patient and charge nurse.   Labs/tests ordered:  CBC/diff, CMP/eGFR, CT head  Time spend 35 minutes.

## 2020-11-09 NOTE — Assessment & Plan Note (Signed)
Vit B12 level 499 05/21/20, takes Vit B12

## 2020-11-10 ENCOUNTER — Ambulatory Visit
Admission: RE | Admit: 2020-11-10 | Discharge: 2020-11-10 | Disposition: A | Discharge status: Home / Self Care | Payer: Medicare Other | Source: Ambulatory Visit | Attending: Internal Medicine | Admitting: Internal Medicine

## 2020-11-10 ENCOUNTER — Encounter: Payer: Self-pay | Admitting: Nurse Practitioner

## 2020-11-10 ENCOUNTER — Other Ambulatory Visit: Payer: Self-pay | Admitting: Internal Medicine

## 2020-11-10 DIAGNOSIS — S72011D Unspecified intracapsular fracture of right femur, subsequent encounter for closed fracture with routine healing: Secondary | ICD-10-CM | POA: Diagnosis not present

## 2020-11-10 DIAGNOSIS — W19XXXA Unspecified fall, initial encounter: Secondary | ICD-10-CM

## 2020-11-10 DIAGNOSIS — M6281 Muscle weakness (generalized): Secondary | ICD-10-CM | POA: Diagnosis not present

## 2020-11-10 DIAGNOSIS — R9082 White matter disease, unspecified: Secondary | ICD-10-CM | POA: Diagnosis not present

## 2020-11-10 DIAGNOSIS — Z96641 Presence of right artificial hip joint: Secondary | ICD-10-CM | POA: Diagnosis not present

## 2020-11-10 DIAGNOSIS — I10 Essential (primary) hypertension: Secondary | ICD-10-CM | POA: Diagnosis not present

## 2020-11-10 DIAGNOSIS — S0003XA Contusion of scalp, initial encounter: Secondary | ICD-10-CM | POA: Diagnosis not present

## 2020-11-10 DIAGNOSIS — R2681 Unsteadiness on feet: Secondary | ICD-10-CM | POA: Diagnosis not present

## 2020-11-10 DIAGNOSIS — R627 Adult failure to thrive: Secondary | ICD-10-CM | POA: Diagnosis not present

## 2020-11-10 DIAGNOSIS — R42 Dizziness and giddiness: Secondary | ICD-10-CM | POA: Diagnosis not present

## 2020-11-10 DIAGNOSIS — Z9181 History of falling: Secondary | ICD-10-CM | POA: Diagnosis not present

## 2020-11-10 DIAGNOSIS — R29898 Other symptoms and signs involving the musculoskeletal system: Secondary | ICD-10-CM | POA: Diagnosis not present

## 2020-11-11 DIAGNOSIS — S72011D Unspecified intracapsular fracture of right femur, subsequent encounter for closed fracture with routine healing: Secondary | ICD-10-CM | POA: Diagnosis not present

## 2020-11-11 DIAGNOSIS — R2681 Unsteadiness on feet: Secondary | ICD-10-CM | POA: Diagnosis not present

## 2020-11-11 DIAGNOSIS — M6281 Muscle weakness (generalized): Secondary | ICD-10-CM | POA: Diagnosis not present

## 2020-11-11 DIAGNOSIS — Z9181 History of falling: Secondary | ICD-10-CM | POA: Diagnosis not present

## 2020-11-11 DIAGNOSIS — Z96641 Presence of right artificial hip joint: Secondary | ICD-10-CM | POA: Diagnosis not present

## 2020-11-11 DIAGNOSIS — R29898 Other symptoms and signs involving the musculoskeletal system: Secondary | ICD-10-CM | POA: Diagnosis not present

## 2020-11-11 LAB — BASIC METABOLIC PANEL
BUN: 18 (ref 4–21)
CO2: 28 — AB (ref 13–22)
Chloride: 104 (ref 99–108)
Creatinine: 0.6 (ref 0.5–1.1)
Glucose: 68
Potassium: 4.1 (ref 3.4–5.3)
Sodium: 139 (ref 137–147)

## 2020-11-11 LAB — HEPATIC FUNCTION PANEL
ALT: 9 (ref 7–35)
AST: 14 (ref 13–35)
Alkaline Phosphatase: 57 (ref 25–125)
Bilirubin, Total: 0.4

## 2020-11-11 LAB — CBC AND DIFFERENTIAL
HCT: 33 — AB (ref 36–46)
Hemoglobin: 10.6 — AB (ref 12.0–16.0)
Neutrophils Absolute: 2362
Platelets: 136 — AB (ref 150–399)
WBC: 4.1

## 2020-11-11 LAB — COMPREHENSIVE METABOLIC PANEL
Calcium: 8.6 — AB (ref 8.7–10.7)
Globulin: 2.3

## 2020-11-12 DIAGNOSIS — M6281 Muscle weakness (generalized): Secondary | ICD-10-CM | POA: Diagnosis not present

## 2020-11-12 DIAGNOSIS — S72011D Unspecified intracapsular fracture of right femur, subsequent encounter for closed fracture with routine healing: Secondary | ICD-10-CM | POA: Diagnosis not present

## 2020-11-12 DIAGNOSIS — R2681 Unsteadiness on feet: Secondary | ICD-10-CM | POA: Diagnosis not present

## 2020-11-12 DIAGNOSIS — Z96641 Presence of right artificial hip joint: Secondary | ICD-10-CM | POA: Diagnosis not present

## 2020-11-12 DIAGNOSIS — Z9181 History of falling: Secondary | ICD-10-CM | POA: Diagnosis not present

## 2020-11-12 DIAGNOSIS — R29898 Other symptoms and signs involving the musculoskeletal system: Secondary | ICD-10-CM | POA: Diagnosis not present

## 2020-11-13 DIAGNOSIS — R2681 Unsteadiness on feet: Secondary | ICD-10-CM | POA: Diagnosis not present

## 2020-11-13 DIAGNOSIS — R29898 Other symptoms and signs involving the musculoskeletal system: Secondary | ICD-10-CM | POA: Diagnosis not present

## 2020-11-13 DIAGNOSIS — Z9181 History of falling: Secondary | ICD-10-CM | POA: Diagnosis not present

## 2020-11-13 DIAGNOSIS — S72011D Unspecified intracapsular fracture of right femur, subsequent encounter for closed fracture with routine healing: Secondary | ICD-10-CM | POA: Diagnosis not present

## 2020-11-13 DIAGNOSIS — M6281 Muscle weakness (generalized): Secondary | ICD-10-CM | POA: Diagnosis not present

## 2020-11-13 DIAGNOSIS — Z96641 Presence of right artificial hip joint: Secondary | ICD-10-CM | POA: Diagnosis not present

## 2020-11-16 DIAGNOSIS — R29898 Other symptoms and signs involving the musculoskeletal system: Secondary | ICD-10-CM | POA: Diagnosis not present

## 2020-11-16 DIAGNOSIS — Z9181 History of falling: Secondary | ICD-10-CM | POA: Diagnosis not present

## 2020-11-16 DIAGNOSIS — R2681 Unsteadiness on feet: Secondary | ICD-10-CM | POA: Diagnosis not present

## 2020-11-16 DIAGNOSIS — Z96641 Presence of right artificial hip joint: Secondary | ICD-10-CM | POA: Diagnosis not present

## 2020-11-16 DIAGNOSIS — M6281 Muscle weakness (generalized): Secondary | ICD-10-CM | POA: Diagnosis not present

## 2020-11-16 DIAGNOSIS — S72011D Unspecified intracapsular fracture of right femur, subsequent encounter for closed fracture with routine healing: Secondary | ICD-10-CM | POA: Diagnosis not present

## 2020-11-17 DIAGNOSIS — M6281 Muscle weakness (generalized): Secondary | ICD-10-CM | POA: Diagnosis not present

## 2020-11-17 DIAGNOSIS — R29898 Other symptoms and signs involving the musculoskeletal system: Secondary | ICD-10-CM | POA: Diagnosis not present

## 2020-11-17 DIAGNOSIS — S72011D Unspecified intracapsular fracture of right femur, subsequent encounter for closed fracture with routine healing: Secondary | ICD-10-CM | POA: Diagnosis not present

## 2020-11-17 DIAGNOSIS — R2681 Unsteadiness on feet: Secondary | ICD-10-CM | POA: Diagnosis not present

## 2020-11-17 DIAGNOSIS — Z9181 History of falling: Secondary | ICD-10-CM | POA: Diagnosis not present

## 2020-11-17 DIAGNOSIS — Z96641 Presence of right artificial hip joint: Secondary | ICD-10-CM | POA: Diagnosis not present

## 2020-11-18 DIAGNOSIS — R29898 Other symptoms and signs involving the musculoskeletal system: Secondary | ICD-10-CM | POA: Diagnosis not present

## 2020-11-18 DIAGNOSIS — Z9181 History of falling: Secondary | ICD-10-CM | POA: Diagnosis not present

## 2020-11-18 DIAGNOSIS — S72011D Unspecified intracapsular fracture of right femur, subsequent encounter for closed fracture with routine healing: Secondary | ICD-10-CM | POA: Diagnosis not present

## 2020-11-18 DIAGNOSIS — R2681 Unsteadiness on feet: Secondary | ICD-10-CM | POA: Diagnosis not present

## 2020-11-18 DIAGNOSIS — Z96641 Presence of right artificial hip joint: Secondary | ICD-10-CM | POA: Diagnosis not present

## 2020-11-18 DIAGNOSIS — M6281 Muscle weakness (generalized): Secondary | ICD-10-CM | POA: Diagnosis not present

## 2020-11-19 DIAGNOSIS — Z9181 History of falling: Secondary | ICD-10-CM | POA: Diagnosis not present

## 2020-11-19 DIAGNOSIS — M6281 Muscle weakness (generalized): Secondary | ICD-10-CM | POA: Diagnosis not present

## 2020-11-19 DIAGNOSIS — Z96641 Presence of right artificial hip joint: Secondary | ICD-10-CM | POA: Diagnosis not present

## 2020-11-19 DIAGNOSIS — R2681 Unsteadiness on feet: Secondary | ICD-10-CM | POA: Diagnosis not present

## 2020-11-19 DIAGNOSIS — S72011D Unspecified intracapsular fracture of right femur, subsequent encounter for closed fracture with routine healing: Secondary | ICD-10-CM | POA: Diagnosis not present

## 2020-11-19 DIAGNOSIS — R29898 Other symptoms and signs involving the musculoskeletal system: Secondary | ICD-10-CM | POA: Diagnosis not present

## 2020-11-20 ENCOUNTER — Encounter: Payer: Self-pay | Admitting: Nurse Practitioner

## 2020-11-20 ENCOUNTER — Non-Acute Institutional Stay (SKILLED_NURSING_FACILITY): Payer: Medicare Other | Admitting: Nurse Practitioner

## 2020-11-20 DIAGNOSIS — I10 Essential (primary) hypertension: Secondary | ICD-10-CM

## 2020-11-20 DIAGNOSIS — D500 Iron deficiency anemia secondary to blood loss (chronic): Secondary | ICD-10-CM | POA: Diagnosis not present

## 2020-11-20 DIAGNOSIS — E538 Deficiency of other specified B group vitamins: Secondary | ICD-10-CM

## 2020-11-20 DIAGNOSIS — E559 Vitamin D deficiency, unspecified: Secondary | ICD-10-CM | POA: Diagnosis not present

## 2020-11-20 DIAGNOSIS — K5901 Slow transit constipation: Secondary | ICD-10-CM

## 2020-11-20 DIAGNOSIS — M810 Age-related osteoporosis without current pathological fracture: Secondary | ICD-10-CM

## 2020-11-20 DIAGNOSIS — J3089 Other allergic rhinitis: Secondary | ICD-10-CM | POA: Diagnosis not present

## 2020-11-20 DIAGNOSIS — F0280 Dementia in other diseases classified elsewhere without behavioral disturbance: Secondary | ICD-10-CM

## 2020-11-20 DIAGNOSIS — G309 Alzheimer's disease, unspecified: Secondary | ICD-10-CM

## 2020-11-20 DIAGNOSIS — M150 Primary generalized (osteo)arthritis: Secondary | ICD-10-CM

## 2020-11-20 DIAGNOSIS — R634 Abnormal weight loss: Secondary | ICD-10-CM

## 2020-11-20 DIAGNOSIS — M545 Low back pain, unspecified: Secondary | ICD-10-CM

## 2020-11-20 DIAGNOSIS — M159 Polyosteoarthritis, unspecified: Secondary | ICD-10-CM

## 2020-11-20 DIAGNOSIS — E785 Hyperlipidemia, unspecified: Secondary | ICD-10-CM

## 2020-11-20 DIAGNOSIS — F339 Major depressive disorder, recurrent, unspecified: Secondary | ICD-10-CM

## 2020-11-20 DIAGNOSIS — D696 Thrombocytopenia, unspecified: Secondary | ICD-10-CM | POA: Diagnosis not present

## 2020-11-20 DIAGNOSIS — R443 Hallucinations, unspecified: Secondary | ICD-10-CM

## 2020-11-20 NOTE — Assessment & Plan Note (Signed)
09/03/20-09/07/20 for s/p R hip hemiarthroplasty due to a mechanical fall, healed.

## 2020-11-20 NOTE — Assessment & Plan Note (Signed)
off Evenity 2nd to cost

## 2020-11-20 NOTE — Assessment & Plan Note (Signed)
Hgb 10.6 11/11/20, post op.

## 2020-11-20 NOTE — Assessment & Plan Note (Signed)
diet, LDL 118 11/01/19

## 2020-11-20 NOTE — Assessment & Plan Note (Signed)
No occurrence hallucination, saw little boys in her room, water coming from the ceiling in her room, left occiput hematoma sustained from fall 11/05/20, healed. CT 11/10/20 no acute intracranial abnormality.

## 2020-11-20 NOTE — Assessment & Plan Note (Signed)
off Amlodipine. Bun/creat 18/0.6 11/11/20

## 2020-11-20 NOTE — Assessment & Plan Note (Signed)
plt 136 11/11/20

## 2020-11-20 NOTE — Assessment & Plan Note (Signed)
Vit B12 deficiency, Vit B12 level 499 05/21/20, takes Vit B12

## 2020-11-20 NOTE — Assessment & Plan Note (Signed)
Vitamin D deficiency, takes Vit d daily

## 2020-11-20 NOTE — Progress Notes (Signed)
Location:   [facility] Room Number: 30 A Place of Service:  SNF (31) Provider:  Inella Kuwahara X Jamoni Hewes, NP  Lovis More X, NP  Patient Care Team: Kandas Oliveto X, NP as PCP - General (Internal Medicine)  Extended Emergency Contact Information Primary Emergency Contact: Lilyanne, [place] Address: [address]          [place], [place] [address] [place] Phone: [phone] Relation: Spouse Secondary Emergency Contact: [facility] Phone: [phone] Relation: Daughter  Code Status:  Full Code Goals of care: Advanced Directive information Advanced Directives 11/20/2020  Does Patient Have a Medical Advance Directive? No  Type of Advance Directive -  Does patient want to make changes to medical advance directive? -  Copy of [place] in Chart? -  Would patient like information on creating a medical advance directive? -     Chief Complaint  Patient presents with   Medical Management of Chronic Issues    Routine Follow up visit,    Health Maintenance    Flu Vaccine    HPI:  Pt is a 80 y.o. female seen today for medical management of chronic diseases.       hallucination, seeing little boys in her room, water coming from the ceiling in her room, left occiput hematoma sustained from fall 11/05/20, healed. CT 11/10/20 no acute intracranial abnormality.              08/18/20-08/20/20 for AMS/lethargy, didn't tolerate Mirtazapine for appetite. CT head/abd/pelvis showed stable T11, L1 compression fx. 09/03/20-09/07/20 for s/p R hip hemiarthroplasty due to a mechanical fall, healed.              OP, off Evenity 2nd to cost              Anemia, Hgb 10.6 11/11/20, post op.              HTN, off Amlodipine. Bun/creat 18/0.6 11/11/20             Constipation, takes Colace, Senokot S, MiraLax.              Alzheimer's Dementia, Vit B12 499, TSH 2.12 05/21/20, SNF [facility] for supportive care              Vit B12 deficiency, Vit B12 level 499 05/21/20,  takes Vit B12             Vitamin D deficiency, takes Vit d daily             Allergic rhinitis, takes Zyrtec.             Hyperlipidemia, diet, LDL 118 11/01/19             Depression, takes Escitalopram, HPOA requested taper off Wellbutrin             Chronic back pain, stable, T11, L1 compression fx, takes Tylenol, Gabapentin             Weight loss/adult failure to thrive, didn't tolerate Mirtazapine.             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   Depression    years ago   Headache    sinus headaches   Lipoma of arm    Right   Medical history non-contributory    Osteoporosis  SVD (spontaneous vaginal delivery)    x 2   Varicose vein    Vitamin D deficiency    Past Surgical History:  Procedure Laterality Date   ANTERIOR APPROACH HEMI HIP ARTHROPLASTY Right 09/04/2020   Procedure: ANTERIOR APPROACH HEMI HIP ARTHROPLASTY;  Surgeon: Rod Can, MD;  Location: [facility];  Service: Orthopedics;  Laterality: Right;   [place]   COLONOSCOPY     GANGLION CYST EXCISION     x2 left and right arm   HYSTEROSCOPY WITH D & C N/A 07/15/2013   Procedure: DILATATION AND CURETTAGE /HYSTEROSCOPY;  Surgeon: Lovenia Kim, MD;  Location: [facility];  Service: Gynecology;  Laterality: N/A;   KYPHOPLASTY Bilateral 05/04/2020   Procedure: KYPHOPLASTY THORACIC TWELVE;  Surgeon: Vallarie Mare, MD;  Location: [facility];  Service: Neurosurgery;  Laterality: Bilateral;   MANDIBLE SURGERY  1954   WISDOM TOOTH EXTRACTION      Allergies  Allergen Reactions   Alendronate Sodium Other (See Comments)    Leg cramps and "Allergic," per MAR   Risedronate Sodium Other (See Comments)    Caused the patient to be achy and is "Allergic," per MAR   Tramadol Other (See Comments)    Caused the patient to feel badly    Keflex [Cephalexin] Other (See Comments)    Possible  rash    Allergies as of 11/20/2020       Reactions   Alendronate Sodium Other (See Comments)   Leg cramps and "Allergic," per MAR   Risedronate Sodium Other (See Comments)   Caused the patient to be achy and is "Allergic," per MAR   Tramadol Other (See Comments)   Caused the patient to feel badly    Keflex [cephalexin] Other (See Comments)   Possible rash        Medication List        Accurate as of November 20, 2020 11:59 PM. If you have any questions, ask your nurse or doctor.          acetaminophen 500 MG tablet Commonly known as: TYLENOL Take 1,000 mg by mouth every 6 (six) hours as needed for moderate pain, mild pain, headache or fever.   ASPIRIN 81 PO Take 1 tablet by mouth 2 (two) times daily. Hold 09/28, 09/29, 10/01   buPROPion 150 MG 24 hr tablet Commonly known as: WELLBUTRIN XL Take 150 mg by mouth daily.   CALCIUM-CARB 600 PO Take 1 tablet by mouth 2 (two) times daily.   cetirizine 10 MG tablet Commonly known as: ZyrTEC Allergy Take 1 tablet (10 mg total) by mouth daily.   docusate sodium 100 MG capsule Commonly known as: COLACE Take 100 mg by mouth at bedtime.   escitalopram 10 MG tablet Commonly known as: LEXAPRO Take 10 mg by mouth every evening.   gabapentin 100 MG capsule Commonly known as: NEURONTIN Take 1 capsule (100 mg total) by mouth 3 (three) times daily.   melatonin 5 MG Tabs Take 5 mg by mouth at bedtime.   polyethylene glycol 17 g packet Commonly known as: MIRALAX / GLYCOLAX Take 17 g by mouth daily as needed for moderate constipation or mild constipation.   PRESERVISION AREDS 2 PO Take 1 capsule by mouth 2 (two) times daily.   RESOURCE 2.0 PO Take 180 mLs by mouth daily at 2 PM.   Salonpas 3.02-13-08 % Ptch Generic drug: Camphor-Menthol-Methyl Sal Apply 1 patch topically See admin instructions. Apply 1 patch to the lower  back and remove (at most) after 8 hours- once a day   senna-docusate 8.6-50 MG tablet Commonly  known as: Senokot-S Take 1 tablet by mouth daily.   sodium phosphate 7-19 GM/118ML Enem Place 1 enema rectally daily as needed for severe constipation.   VITAMIN B12-FOLIC ACID PO Place 1 lozenge under the tongue in the morning.   Vitamin D3 50 MCG (2000 UT) capsule Take 1 capsule (2,000 Units total) by mouth daily.        Review of Systems  Constitutional:  Negative for fatigue, fever and unexpected weight change.       #80Ibs  HENT:  Positive for hearing loss. Negative for congestion and voice change.   Eyes:  Negative for visual disturbance.  Respiratory:  Negative for cough and shortness of breath.   Cardiovascular:  Negative for leg swelling.  Gastrointestinal:  Negative for abdominal pain and constipation.  Genitourinary:  Negative for dysuria and urgency.  Musculoskeletal:  Positive for arthralgias, back pain and gait problem.       R hip pain is minimal.   Skin:  Negative for color change.  Neurological:  Negative for speech difficulty, weakness and headaches.       Dementia.   Psychiatric/Behavioral:  Negative for confusion and hallucinations. The patient is not nervous/anxious.    Immunization History  Administered Date(s) Administered   DT (Pediatric) 04/10/2002   Fluad Quad(high Dose 65+) 10/28/2019   Influenza Split 03/08/2011, 11/17/2011   Influenza Whole 01/09/2007, 11/05/2008, 11/04/2009   Influenza, High Dose Seasonal PF 12/07/2015, 09/29/2016, 11/09/2017, 09/27/2018, 10/28/2019   Influenza-Unspecified 10/30/2012, 11/29/2013, 12/02/2014   Moderna Sars-Covid-2 Vaccination 02/08/2019, 03/11/2019   PFIZER(Purple Top)SARS-COV-2 Vaccination 10/09/2019   PPD Test 12/22/2015, 01/05/2016   Pneumococcal Conjugate-13 07/17/2014   Pneumococcal Polysaccharide-23 08/15/2005, 09/25/2015   Tetanus 12/24/2012   Unspecified SARS-COV-2 Vaccination 12/17/2019, 10/27/2020   Zoster Recombinat (Shingrix) 06/07/2019, 09/06/2019   Zoster, Live 12/05/2013   Pertinent  Health  Maintenance Due  Topic Date Due   INFLUENZA VACCINE  09/07/2020   DEXA SCAN  Completed   Fall Risk  10/10/2019 10/08/2018 10/02/2017 09/29/2016 10/08/2015  Falls in the past year? 0 0 No No No  Comment - - - - Emmi Telephone Survey: data to providers prior to load  Number falls in past yr: 0 0 - - -  Injury with Fall? 0 0 - - -  Risk for fall due to : No Fall Risks - - - -  Follow up Falls evaluation completed - - - -   Functional Status Survey:    Vitals:   11/20/20 1025  BP: 112/80  Pulse: 88  Resp: 18  Temp: (!) 96.7 F (35.9 C)  SpO2: 97%  Weight: 84 lb 9.6 oz (38.4 kg)  Height: 5\' 4"  (1.626 m)   Body mass index is 14.52 kg/m. Physical Exam Vitals and nursing note reviewed.  Constitutional:      Appearance: Normal appearance.  HENT:     Head: Normocephalic.     Nose: Nose normal.     Mouth/Throat:     Mouth: Mucous membranes are moist.  Eyes:     Conjunctiva/sclera: Conjunctivae normal.     Pupils: Pupils are equal, round, and reactive to light.  Cardiovascular:     Rate and Rhythm: Normal rate.     Heart sounds: No murmur heard. Pulmonary:     Effort: Pulmonary effort is normal.     Breath sounds: No rales.  Abdominal:     General: Bowel  sounds are normal.     Palpations: Abdomen is soft.     Tenderness: There is no abdominal tenderness. There is no right CVA tenderness, left CVA tenderness, guarding or rebound.  Musculoskeletal:     Cervical back: Normal range of motion and neck supple.     Right lower leg: No edema.     Left lower leg: No edema.     Comments: Right hip s/p ORIF 09/08/20, healed.   Skin:    General: Skin is warm and dry.  Neurological:     General: No focal deficit present.     Mental Status: She is alert. Mental status is at baseline.     Motor: No weakness.     Coordination: Coordination normal.     Gait: Gait abnormal.     Comments: Oriented to self.   Psychiatric:        Mood and Affect: Mood normal.     Comments: Reported seeing  little boys and water came down from ceiling in her room 11/08/20    Labs reviewed: Recent Labs    08/18/20 2330 08/19/20 0429 09/03/20 0716 09/04/20 1917 09/05/20 0537 09/06/20 0533 09/18/20 0000 09/22/20 0000 11/11/20 0000  NA  --  137 136  --  133* 135 138 138 139  K  --  3.6 3.7  --  3.2* 3.5 4.0 4.1 4.1  CL  --  104 100  --  101 102 101 100 104  CO2  --  24  --   --  25 27 30* 27* 28*  GLUCOSE  --  80 86  --  110* 95  --   --   --   BUN  --  10 16  --  23 18 17 18 18   CREATININE  --  <0.30* 0.50   < > 0.59 0.40* 0.4* 0.5 0.6  CALCIUM  --  8.6*  --   --  8.6* 8.7* 8.3* 8.5* 8.6*  MG 2.2 2.1  --   --   --   --   --   --   --   PHOS  --  3.1  --   --   --   --   --   --   --    < > = values in this interval not displayed.   Recent Labs    07/09/20 1830 08/18/20 1622 08/19/20 0429 09/18/20 0000 09/22/20 0000 11/11/20 0000  AST 18 25 22 21 26 14   ALT 15 18 18 22 28 9   ALKPHOS 73 91 86 100 112 57  BILITOT 0.7 0.9 1.0  --   --   --   PROT 5.5* 6.3* 6.4*  --   --   --   ALBUMIN 3.1* 3.7 3.9 3.1* 3.0*  --    Recent Labs    09/05/20 0537 09/06/20 0533 09/07/20 0400 09/18/20 0000 09/22/20 0000 11/11/20 0000  WBC 11.8* 9.6 8.1 6.5 5.7 4.1  NEUTROABS  --   --   --  4,979.00 4,252.00 2,362.00  HGB 11.4* 11.1* 10.1* 11.1* 11.3* 10.6*  HCT 34.3* 34.1* 30.1* 34* 35* 33*  MCV 93.0 93.2 92.0  --   --   --   PLT 134* 121* 136*  --  342 136*   Lab Results  Component Value Date   TSH 2.12 05/21/2020   No results found for: HGBA1C Lab Results  Component Value Date   CHOL 201 (H) 11/01/2019   HDL 69 11/01/2019  LDLCALC 118 (H) 11/01/2019   LDLDIRECT 135.4 12/26/2008   TRIG 52 11/01/2019   CHOLHDL 2.9 11/01/2019    Significant Diagnostic Results in last 30 days:  CT HEAD WO CONTRAST (5MM)  Result Date: 11/10/2020 CLINICAL DATA:  Dizziness after fall last week. EXAM: CT HEAD WITHOUT CONTRAST TECHNIQUE: Contiguous axial images were obtained from the base of the  skull through the vertex without intravenous contrast. COMPARISON:  September 03, 2020. FINDINGS: Brain: Mild chronic ischemic white matter disease is noted. No mass effect or midline shift is noted. Ventricular size is within normal limits. There is no evidence of mass lesion, hemorrhage or acute infarction. Vascular: No hyperdense vessel or unexpected calcification. Skull: Normal. Negative for fracture or focal lesion. Sinuses/Orbits: No acute finding. Other: Small left posterior scalp hematoma is noted. IMPRESSION: Small left posterior scalp hematoma. No acute intracranial abnormality seen. Electronically Signed   By: Marijo Conception M.D.   On: 11/10/2020 14:04    Assessment/Plan Alzheimer disease ([place])  Alzheimer's Dementia, Vit B12 499, TSH 2.12 05/21/20, SNF [facility] for supportive care   Vitamin B12 deficiency Vit B12 deficiency, Vit B12 level 499 05/21/20, takes Vit B12  Vitamin D deficiency Vitamin D deficiency, takes Vit d daily  Allergic rhinitis Allergic rhinitis, takes Zyrtec.  Hyperlipidemia diet, LDL 118 11/01/19  Depression, recurrent (HCC) takes Escitalopram, HPOA requested taper off Wellbutrin  Low back pain Chronic back pain, stable, T11, L1 compression fx, takes Tylenol, Gabapentin  Weight loss, abnormal Weight loss/adult failure to thrive, didn't tolerate Mirtazapine. Weight stable.   Thrombocytopenia (HCC) plt 136 11/11/20  Slow transit constipation  takes Colace, Senokot S, MiraLax.   HTN (hypertension) off Amlodipine. Bun/creat 18/0.6 11/11/20  Blood loss anemia Hgb 10.6 11/11/20, post op.   Osteoporosis off Evenity 2nd to cost   Osteoarthritis, multiple sites 09/03/20-09/07/20 for s/p R hip hemiarthroplasty due to a mechanical fall, healed.   Hallucination No occurrence hallucination, saw little boys in her room, water coming from the ceiling in her room, left occiput hematoma sustained from fall 11/05/20, healed. CT 11/10/20 no acute intracranial abnormality.      Family/ staff Communication: plan of care reviewed with the patient and charge nurse.   Labs/tests ordered:  none  Time spend 35 minutes.

## 2020-11-20 NOTE — Assessment & Plan Note (Signed)
Allergic rhinitis, takes Zyrtec.

## 2020-11-20 NOTE — Assessment & Plan Note (Signed)
Alzheimer's Dementia, Vit B12 499, TSH 2.12 05/21/20, SNF [facility] for supportive care

## 2020-11-20 NOTE — Assessment & Plan Note (Signed)
Chronic back pain, stable, T11, L1 compression fx, takes Tylenol, Gabapentin

## 2020-11-20 NOTE — Assessment & Plan Note (Signed)
takes Escitalopram, HPOA requested taper off Wellbutrin

## 2020-11-20 NOTE — Assessment & Plan Note (Signed)
takes Colace, Senokot S, MiraLax  

## 2020-11-20 NOTE — Assessment & Plan Note (Signed)
Weight loss/adult failure to thrive, didn't tolerate Mirtazapine. Weight stable.

## 2020-11-23 ENCOUNTER — Encounter: Payer: Self-pay | Admitting: Nurse Practitioner

## 2020-11-23 DIAGNOSIS — M6281 Muscle weakness (generalized): Secondary | ICD-10-CM | POA: Diagnosis not present

## 2020-11-23 DIAGNOSIS — R29898 Other symptoms and signs involving the musculoskeletal system: Secondary | ICD-10-CM | POA: Diagnosis not present

## 2020-11-23 DIAGNOSIS — Z9181 History of falling: Secondary | ICD-10-CM | POA: Diagnosis not present

## 2020-11-23 DIAGNOSIS — S72011D Unspecified intracapsular fracture of right femur, subsequent encounter for closed fracture with routine healing: Secondary | ICD-10-CM | POA: Diagnosis not present

## 2020-11-23 DIAGNOSIS — R2681 Unsteadiness on feet: Secondary | ICD-10-CM | POA: Diagnosis not present

## 2020-11-23 DIAGNOSIS — Z96641 Presence of right artificial hip joint: Secondary | ICD-10-CM | POA: Diagnosis not present

## 2020-11-24 ENCOUNTER — Encounter: Payer: Self-pay | Admitting: Internal Medicine

## 2020-11-24 ENCOUNTER — Non-Acute Institutional Stay (SKILLED_NURSING_FACILITY): Payer: Medicare Other | Admitting: Internal Medicine

## 2020-11-24 DIAGNOSIS — Z8781 Personal history of (healed) traumatic fracture: Secondary | ICD-10-CM

## 2020-11-24 DIAGNOSIS — M810 Age-related osteoporosis without current pathological fracture: Secondary | ICD-10-CM

## 2020-11-24 DIAGNOSIS — F339 Major depressive disorder, recurrent, unspecified: Secondary | ICD-10-CM | POA: Diagnosis not present

## 2020-11-24 DIAGNOSIS — Z9181 History of falling: Secondary | ICD-10-CM | POA: Diagnosis not present

## 2020-11-24 DIAGNOSIS — M6281 Muscle weakness (generalized): Secondary | ICD-10-CM | POA: Diagnosis not present

## 2020-11-24 DIAGNOSIS — R29898 Other symptoms and signs involving the musculoskeletal system: Secondary | ICD-10-CM | POA: Diagnosis not present

## 2020-11-24 DIAGNOSIS — D500 Iron deficiency anemia secondary to blood loss (chronic): Secondary | ICD-10-CM | POA: Diagnosis not present

## 2020-11-24 DIAGNOSIS — R4189 Other symptoms and signs involving cognitive functions and awareness: Secondary | ICD-10-CM

## 2020-11-24 DIAGNOSIS — K5901 Slow transit constipation: Secondary | ICD-10-CM | POA: Diagnosis not present

## 2020-11-24 DIAGNOSIS — R443 Hallucinations, unspecified: Secondary | ICD-10-CM

## 2020-11-24 DIAGNOSIS — I10 Essential (primary) hypertension: Secondary | ICD-10-CM | POA: Diagnosis not present

## 2020-11-24 DIAGNOSIS — R2681 Unsteadiness on feet: Secondary | ICD-10-CM | POA: Diagnosis not present

## 2020-11-24 DIAGNOSIS — Z96641 Presence of right artificial hip joint: Secondary | ICD-10-CM | POA: Diagnosis not present

## 2020-11-24 DIAGNOSIS — S72011D Unspecified intracapsular fracture of right femur, subsequent encounter for closed fracture with routine healing: Secondary | ICD-10-CM | POA: Diagnosis not present

## 2020-11-24 NOTE — Progress Notes (Signed)
Location:   [facility] Room Number: 30 Place of Service:  SNF (31)  Provider: Veleta Miners MD  PCP: Mast, Man X, NP Patient Care Team: Mast, Man X, NP as PCP - General (Internal Medicine)  Extended Emergency Contact Information Primary Emergency Contact: Tatijana, Bierly Address: [address]          [place], [place] [address] [place] Phone: [phone] Relation: Spouse Secondary Emergency Contact: [facility] Phone: [phone] Relation: Daughter  Code Status: Full Code Goals of care:  Advanced Directive information Advanced Directives 11/24/2020  Does Patient Have a Medical Advance Directive? No  Type of Advance Directive -  Does patient want to make changes to medical advance directive? No - Patient declined  Copy of [place] in Chart? -  Would patient like information on creating a medical advance directive? -     Allergies  Allergen Reactions   Alendronate Sodium Other (See Comments)    Leg cramps and "Allergic," per MAR   Risedronate Sodium Other (See Comments)    Caused the patient to be achy and is "Allergic," per MAR   Tramadol Other (See Comments)    Caused the patient to feel badly    Keflex [Cephalexin] Other (See Comments)    Possible rash    Chief Complaint  Patient presents with   Discharge Note    Discharge from SNF    HPI:  80 y.o. female  for discharge to AL. Patient will follow with her PCP  Patient admitted in the hospital from 7/28-8/1 for closed right hip fracture S/p right hip hemiarthroplasty   Patient has h/o Osteoporosis with Multiple Compression Fracture on Evenity per Dr [name] H/o Weight loss ? Depression Has lost 20 lbs in past 3 months Was also admitted in the hospital from 7/12-7/14 for Change in Mental status most likely due to Remeron her CT scan showed moderate atrophy with chronic microvascular ischemia Then had fall in the room and sustained  Right Hip Fracture  She did have another fall in her room which caused her to have Scalp Hematoma  CT Scan was done which was negative for any acute changes  Was started on Wellbutrin for her depression but Family wants that to be stopped  Patient is going to be discharged to Assisted living tomorrow and will follow with her PCP She continues to stay very anxious says she sees little boys in her room who are telling her that her husband is cheating on her. Husband was also there in the room Also c/o Pain in her abdomen due to Constipation Appetite stays poor Though has gained some weight Gets Upset very easily. Very Paranoid. 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   Depression    years ago   Headache    sinus headaches   Lipoma of arm    Right   Medical history non-contributory    Osteoporosis    SVD (spontaneous vaginal delivery)    x 2   Varicose vein    Vitamin D deficiency     Past Surgical History:  Procedure Laterality Date   ANTERIOR APPROACH HEMI HIP ARTHROPLASTY Right 09/04/2020   Procedure: ANTERIOR APPROACH HEMI HIP ARTHROPLASTY;  Surgeon: Rod Can, MD;  Location: [facility];  Service: Orthopedics;  Laterality: Right;  [place]   COLONOSCOPY     GANGLION CYST EXCISION     x2 left and right arm   HYSTEROSCOPY WITH D & C N/A 07/15/2013   Procedure: DILATATION AND CURETTAGE /HYSTEROSCOPY;  Surgeon: Lovenia Kim, MD;  Location: [facility];  Service: Gynecology;  Laterality: N/A;   KYPHOPLASTY Bilateral 05/04/2020   Procedure: KYPHOPLASTY THORACIC TWELVE;  Surgeon: Vallarie Mare, MD;  Location: [facility];  Service: Neurosurgery;  Laterality: Bilateral;   MANDIBLE SURGERY  1954   WISDOM TOOTH EXTRACTION        reports that she has never smoked. She has never used smokeless tobacco. She reports that she does not drink alcohol and  does not use drugs. Social History   Socioeconomic History   Marital status: Married    Spouse name: Not on file   Number of children: 1   Years of education: Not on file   Highest education level: Not on file  Occupational History   Occupation: retired  Tobacco Use   Smoking status: Never   Smokeless tobacco: Never  Vaping Use   Vaping Use: Never used  Substance and Sexual Activity   Alcohol use: No   Drug use: No   Sexual activity: Yes    Birth control/protection: Post-menopausal  Other Topics Concern   Not on file  Social History Narrative   GI in [place] for colonoscopy   Dr Ronita Hipps - GYN      Denies Surgical history      Family history of varicose veins   Mother is 30      Regular exercise - NO   Social Determinants of Health   Financial Resource Strain: Not on file  Food Insecurity: Not on file  Transportation Needs: Not on file  Physical Activity: Not on file  Stress: Not on file  Social Connections: Not on file  Intimate Partner Violence: Not on file   Functional Status Survey:    Allergies  Allergen Reactions   Alendronate Sodium Other (See Comments)    Leg cramps and "Allergic," per MAR   Risedronate Sodium Other (See Comments)    Caused the patient to be achy and is "Allergic," per MAR   Tramadol Other (See Comments)    Caused the patient to feel badly    Keflex [Cephalexin] Other (See Comments)    Possible rash    Pertinent  Health Maintenance Due  Topic Date Due   INFLUENZA VACCINE  09/07/2020   DEXA SCAN  Completed    Medications: Allergies as of 11/24/2020       Reactions   Alendronate Sodium Other (See Comments)   Leg cramps and "Allergic," per MAR   Risedronate Sodium Other (See Comments)   Caused the patient to be achy and is "Allergic," per MAR   Tramadol Other (See Comments)   Caused the patient to feel badly    Keflex [cephalexin] Other (See Comments)   Possible rash        Medication List        Accurate as of  November 24, 2020 12:17 PM. If you have any questions, ask your nurse or doctor.          acetaminophen 500 MG tablet Commonly known as: TYLENOL Take 1,000 mg by mouth every 6 (six) hours as needed for moderate pain, mild pain, headache or fever.   ASPIRIN 81 PO Take 1 tablet by mouth 2 (two) times daily. Hold 09/28, 09/29, 10/01  buPROPion 150 MG 24 hr tablet Commonly known as: WELLBUTRIN XL Take 150 mg by mouth. Once A Day on Mon, Wed   CALCIUM-CARB 600 PO Take 1 tablet by mouth 2 (two) times daily.   cetirizine 10 MG tablet Commonly known as: ZyrTEC Allergy Take 1 tablet (10 mg total) by mouth daily.   docusate sodium 100 MG capsule Commonly known as: COLACE Take 100 mg by mouth at bedtime.   escitalopram 10 MG tablet Commonly known as: LEXAPRO Take 10 mg by mouth every evening.   gabapentin 100 MG capsule Commonly known as: NEURONTIN Take 1 capsule (100 mg total) by mouth 3 (three) times daily.   melatonin 5 MG Tabs Take 5 mg by mouth at bedtime.   polyethylene glycol 17 g packet Commonly known as: MIRALAX / GLYCOLAX Take 17 g by mouth daily as needed for moderate constipation or mild constipation.   PRESERVISION AREDS 2 PO Take 1 capsule by mouth 2 (two) times daily.   RESOURCE 2.0 PO Take 180 mLs by mouth daily at 2 PM.   Salonpas 3.02-13-08 % Ptch Generic drug: Camphor-Menthol-Methyl Sal Apply 1 patch topically See admin instructions. Apply 1 patch to the lower back and remove (at most) after 8 hours- once a day   senna-docusate 8.6-50 MG tablet Commonly known as: Senokot-S Take 2 tablets by mouth daily.   sodium phosphate 7-19 GM/118ML Enem Place 1 enema rectally daily as needed for severe constipation.   VITAMIN B12-FOLIC ACID PO Place 1 lozenge under the tongue in the morning.   Vitamin D3 50 MCG (2000 UT) capsule Take 1 capsule (2,000 Units total) by mouth daily.        Review of Systems  Constitutional:  Positive for activity change and  appetite change.  HENT: Negative.    Respiratory: Negative.    Cardiovascular: Negative.   Gastrointestinal:  Positive for abdominal pain and constipation.  Genitourinary: Negative.   Musculoskeletal:  Positive for gait problem.  Skin: Negative.   Neurological:  Positive for weakness.  Psychiatric/Behavioral:  Positive for behavioral problems, confusion, dysphoric mood, hallucinations and sleep disturbance. The patient is nervous/anxious.    Vitals:   11/24/20 1211  BP: 136/74  Pulse: 79  Resp: 18  Temp: (!) 97.3 F (36.3 C)  SpO2: 96%  Weight: 85 lb 11.2 oz (38.9 kg)  Height: 5\' 4"  (1.626 m)   Body mass index is 14.71 kg/m. Physical Exam Vitals reviewed.  Constitutional:      Comments: Very frail  HENT:     Head: Normocephalic.     Nose: Nose normal.     Mouth/Throat:     Mouth: Mucous membranes are moist.     Pharynx: Oropharynx is clear.  Eyes:     Pupils: Pupils are equal, round, and reactive to light.  Cardiovascular:     Rate and Rhythm: Normal rate and regular rhythm.     Pulses: Normal pulses.     Heart sounds: Normal heart sounds.  Pulmonary:     Effort: Pulmonary effort is normal.     Breath sounds: Normal breath sounds.  Abdominal:     Palpations: Abdomen is soft.     Comments: C/o Pain in her Left Lower quadrant  Musculoskeletal:        General: No swelling.     Cervical back: Neck supple.  Skin:    General: Skin is warm.  Neurological:     General: No focal deficit present.     Mental Status: She is  alert.  Psychiatric:     Comments: Very Anxious Paranoid Upset and Angry    Labs reviewed: Basic Metabolic Panel: Recent Labs    08/18/20 2330 08/19/20 0429 09/03/20 0716 09/04/20 1917 09/05/20 0537 09/06/20 0533 09/18/20 0000 09/22/20 0000 11/11/20 0000  NA  --  137 136  --  133* 135 138 138 139  K  --  3.6 3.7  --  3.2* 3.5 4.0 4.1 4.1  CL  --  104 100  --  101 102 101 100 104  CO2  --  24  --   --  25 27 30* 27* 28*  GLUCOSE  --   80 86  --  110* 95  --   --   --   BUN  --  10 16  --  23 18 17 18 18   CREATININE  --  <0.30* 0.50   < > 0.59 0.40* 0.4* 0.5 0.6  CALCIUM  --  8.6*  --   --  8.6* 8.7* 8.3* 8.5* 8.6*  MG 2.2 2.1  --   --   --   --   --   --   --   PHOS  --  3.1  --   --   --   --   --   --   --    < > = values in this interval not displayed.   Liver Function Tests: Recent Labs    07/09/20 1830 08/18/20 1622 08/19/20 0429 09/18/20 0000 09/22/20 0000 11/11/20 0000  AST 18 25 22 21 26 14   ALT 15 18 18 22 28 9   ALKPHOS 73 91 86 100 112 57  BILITOT 0.7 0.9 1.0  --   --   --   PROT 5.5* 6.3* 6.4*  --   --   --   ALBUMIN 3.1* 3.7 3.9 3.1* 3.0*  --    Recent Labs    08/18/20 1622  LIPASE 34   No results for input(s): AMMONIA in the last 8760 hours. CBC: Recent Labs    09/05/20 0537 09/06/20 0533 09/07/20 0400 09/18/20 0000 09/22/20 0000 11/11/20 0000  WBC 11.8* 9.6 8.1 6.5 5.7 4.1  NEUTROABS  --   --   --  4,979.00 4,252.00 2,362.00  HGB 11.4* 11.1* 10.1* 11.1* 11.3* 10.6*  HCT 34.3* 34.1* 30.1* 34* 35* 33*  MCV 93.0 93.2 92.0  --   --   --   PLT 134* 121* 136*  --  342 136*   Cardiac Enzymes: No results for input(s): CKTOTAL, CKMB, CKMBINDEX, TROPONINI in the last 8760 hours. BNP: Invalid input(s): POCBNP CBG: Recent Labs    08/18/20 1736  GLUCAP 72    Procedures and Imaging Studies During Stay: CT HEAD WO CONTRAST (5MM)  Result Date: 11/10/2020 CLINICAL DATA:  Dizziness after fall last week. EXAM: CT HEAD WITHOUT CONTRAST TECHNIQUE: Contiguous axial images were obtained from the base of the skull through the vertex without intravenous contrast. COMPARISON:  September 03, 2020. FINDINGS: Brain: Mild chronic ischemic white matter disease is noted. No mass effect or midline shift is noted. Ventricular size is within normal limits. There is no evidence of mass lesion, hemorrhage or acute infarction. Vascular: No hyperdense vessel or unexpected calcification. Skull: Normal. Negative for  fracture or focal lesion. Sinuses/Orbits: No acute finding. Other: Small left posterior scalp hematoma is noted. IMPRESSION: Small left posterior scalp hematoma. No acute intracranial abnormality seen. Electronically Signed   By: Bobbe Medico.D.  On: 11/10/2020 14:04    Assessment/Plan:   S/P right hip Hemiarthroplasty Doing well Pain Controlled Walking with her wawlker Need to Follow with Ortho  Hypertension, unspecified type Off all meds  BP stable Age-related osteoporosis without current pathological fracture To be restarted on Evenity per her PCP  Did not  get any doses in facility due to Cost  Depression, recurrent ([place]) Wellbutrin Tapering Off On Lexapro only Had change in mental status due to Remeron Follows with PCP Hallucination CT scan of head was normal ? Due to her depression. Will need further Eval Follow with PCP Blood loss anemia Not on Iron due to Constipation Poor Appetite HGB stable  Slow transit constipation Check for Imapction and increase her Miralax to BID for few days Disposition Patient is going to [place] with her husband With eventual goal of going to her Apartment Her depression continues to be her major issue      Patient is being discharged with the following durable medical equipment:  Walker  Patient has been advised to f/u with their PCP in 1-2 weeks to bring them up to date on their rehab stay.   Future labs/tests needed:

## 2020-11-26 DIAGNOSIS — R531 Weakness: Secondary | ICD-10-CM | POA: Diagnosis not present

## 2020-11-26 DIAGNOSIS — Z9181 History of falling: Secondary | ICD-10-CM | POA: Diagnosis not present

## 2020-11-26 DIAGNOSIS — R29898 Other symptoms and signs involving the musculoskeletal system: Secondary | ICD-10-CM | POA: Diagnosis not present

## 2020-11-26 DIAGNOSIS — M6281 Muscle weakness (generalized): Secondary | ICD-10-CM | POA: Diagnosis not present

## 2020-11-26 DIAGNOSIS — R2681 Unsteadiness on feet: Secondary | ICD-10-CM | POA: Diagnosis not present

## 2020-11-26 DIAGNOSIS — R627 Adult failure to thrive: Secondary | ICD-10-CM | POA: Diagnosis not present

## 2020-11-26 DIAGNOSIS — R41841 Cognitive communication deficit: Secondary | ICD-10-CM | POA: Diagnosis not present

## 2020-11-26 DIAGNOSIS — E441 Mild protein-calorie malnutrition: Secondary | ICD-10-CM | POA: Diagnosis not present

## 2020-11-27 DIAGNOSIS — R29898 Other symptoms and signs involving the musculoskeletal system: Secondary | ICD-10-CM | POA: Diagnosis not present

## 2020-11-27 DIAGNOSIS — R627 Adult failure to thrive: Secondary | ICD-10-CM | POA: Diagnosis not present

## 2020-11-27 DIAGNOSIS — E441 Mild protein-calorie malnutrition: Secondary | ICD-10-CM | POA: Diagnosis not present

## 2020-11-27 DIAGNOSIS — R531 Weakness: Secondary | ICD-10-CM | POA: Diagnosis not present

## 2020-11-27 DIAGNOSIS — M6281 Muscle weakness (generalized): Secondary | ICD-10-CM | POA: Diagnosis not present

## 2020-11-27 DIAGNOSIS — R41841 Cognitive communication deficit: Secondary | ICD-10-CM | POA: Diagnosis not present

## 2020-11-30 DIAGNOSIS — E441 Mild protein-calorie malnutrition: Secondary | ICD-10-CM | POA: Diagnosis not present

## 2020-11-30 DIAGNOSIS — R41841 Cognitive communication deficit: Secondary | ICD-10-CM | POA: Diagnosis not present

## 2020-11-30 DIAGNOSIS — R531 Weakness: Secondary | ICD-10-CM | POA: Diagnosis not present

## 2020-11-30 DIAGNOSIS — R627 Adult failure to thrive: Secondary | ICD-10-CM | POA: Diagnosis not present

## 2020-11-30 DIAGNOSIS — R29898 Other symptoms and signs involving the musculoskeletal system: Secondary | ICD-10-CM | POA: Diagnosis not present

## 2020-11-30 DIAGNOSIS — M6281 Muscle weakness (generalized): Secondary | ICD-10-CM | POA: Diagnosis not present

## 2020-12-01 DIAGNOSIS — R29898 Other symptoms and signs involving the musculoskeletal system: Secondary | ICD-10-CM | POA: Diagnosis not present

## 2020-12-01 DIAGNOSIS — R627 Adult failure to thrive: Secondary | ICD-10-CM | POA: Diagnosis not present

## 2020-12-01 DIAGNOSIS — M6281 Muscle weakness (generalized): Secondary | ICD-10-CM | POA: Diagnosis not present

## 2020-12-01 DIAGNOSIS — L814 Other melanin hyperpigmentation: Secondary | ICD-10-CM | POA: Diagnosis not present

## 2020-12-01 DIAGNOSIS — E441 Mild protein-calorie malnutrition: Secondary | ICD-10-CM | POA: Diagnosis not present

## 2020-12-01 DIAGNOSIS — L570 Actinic keratosis: Secondary | ICD-10-CM | POA: Diagnosis not present

## 2020-12-01 DIAGNOSIS — R531 Weakness: Secondary | ICD-10-CM | POA: Diagnosis not present

## 2020-12-01 DIAGNOSIS — R41841 Cognitive communication deficit: Secondary | ICD-10-CM | POA: Diagnosis not present

## 2020-12-02 DIAGNOSIS — E441 Mild protein-calorie malnutrition: Secondary | ICD-10-CM | POA: Diagnosis not present

## 2020-12-02 DIAGNOSIS — R627 Adult failure to thrive: Secondary | ICD-10-CM | POA: Diagnosis not present

## 2020-12-02 DIAGNOSIS — R531 Weakness: Secondary | ICD-10-CM | POA: Diagnosis not present

## 2020-12-02 DIAGNOSIS — R41841 Cognitive communication deficit: Secondary | ICD-10-CM | POA: Diagnosis not present

## 2020-12-02 DIAGNOSIS — M6281 Muscle weakness (generalized): Secondary | ICD-10-CM | POA: Diagnosis not present

## 2020-12-02 DIAGNOSIS — R29898 Other symptoms and signs involving the musculoskeletal system: Secondary | ICD-10-CM | POA: Diagnosis not present

## 2020-12-03 DIAGNOSIS — E441 Mild protein-calorie malnutrition: Secondary | ICD-10-CM | POA: Diagnosis not present

## 2020-12-03 DIAGNOSIS — M6281 Muscle weakness (generalized): Secondary | ICD-10-CM | POA: Diagnosis not present

## 2020-12-03 DIAGNOSIS — R627 Adult failure to thrive: Secondary | ICD-10-CM | POA: Diagnosis not present

## 2020-12-03 DIAGNOSIS — R531 Weakness: Secondary | ICD-10-CM | POA: Diagnosis not present

## 2020-12-03 DIAGNOSIS — R41841 Cognitive communication deficit: Secondary | ICD-10-CM | POA: Diagnosis not present

## 2020-12-03 DIAGNOSIS — R29898 Other symptoms and signs involving the musculoskeletal system: Secondary | ICD-10-CM | POA: Diagnosis not present

## 2020-12-04 DIAGNOSIS — R627 Adult failure to thrive: Secondary | ICD-10-CM | POA: Diagnosis not present

## 2020-12-04 DIAGNOSIS — R41841 Cognitive communication deficit: Secondary | ICD-10-CM | POA: Diagnosis not present

## 2020-12-04 DIAGNOSIS — M6281 Muscle weakness (generalized): Secondary | ICD-10-CM | POA: Diagnosis not present

## 2020-12-04 DIAGNOSIS — E441 Mild protein-calorie malnutrition: Secondary | ICD-10-CM | POA: Diagnosis not present

## 2020-12-04 DIAGNOSIS — R29898 Other symptoms and signs involving the musculoskeletal system: Secondary | ICD-10-CM | POA: Diagnosis not present

## 2020-12-04 DIAGNOSIS — R531 Weakness: Secondary | ICD-10-CM | POA: Diagnosis not present

## 2020-12-08 ENCOUNTER — Ambulatory Visit: Payer: Medicare Other | Admitting: Internal Medicine

## 2020-12-08 DIAGNOSIS — R2681 Unsteadiness on feet: Secondary | ICD-10-CM | POA: Diagnosis not present

## 2020-12-08 DIAGNOSIS — Z9181 History of falling: Secondary | ICD-10-CM | POA: Diagnosis not present

## 2020-12-08 DIAGNOSIS — R531 Weakness: Secondary | ICD-10-CM | POA: Diagnosis not present

## 2020-12-08 DIAGNOSIS — M6281 Muscle weakness (generalized): Secondary | ICD-10-CM | POA: Diagnosis not present

## 2020-12-08 DIAGNOSIS — R29898 Other symptoms and signs involving the musculoskeletal system: Secondary | ICD-10-CM | POA: Diagnosis not present

## 2020-12-08 DIAGNOSIS — R41841 Cognitive communication deficit: Secondary | ICD-10-CM | POA: Diagnosis not present

## 2020-12-09 DIAGNOSIS — M6281 Muscle weakness (generalized): Secondary | ICD-10-CM | POA: Diagnosis not present

## 2020-12-09 DIAGNOSIS — Z9181 History of falling: Secondary | ICD-10-CM | POA: Diagnosis not present

## 2020-12-09 DIAGNOSIS — R2681 Unsteadiness on feet: Secondary | ICD-10-CM | POA: Diagnosis not present

## 2020-12-09 DIAGNOSIS — R531 Weakness: Secondary | ICD-10-CM | POA: Diagnosis not present

## 2020-12-09 DIAGNOSIS — R41841 Cognitive communication deficit: Secondary | ICD-10-CM | POA: Diagnosis not present

## 2020-12-09 DIAGNOSIS — R29898 Other symptoms and signs involving the musculoskeletal system: Secondary | ICD-10-CM | POA: Diagnosis not present

## 2020-12-10 DIAGNOSIS — Z9181 History of falling: Secondary | ICD-10-CM | POA: Diagnosis not present

## 2020-12-10 DIAGNOSIS — R41841 Cognitive communication deficit: Secondary | ICD-10-CM | POA: Diagnosis not present

## 2020-12-10 DIAGNOSIS — R531 Weakness: Secondary | ICD-10-CM | POA: Diagnosis not present

## 2020-12-10 DIAGNOSIS — R29898 Other symptoms and signs involving the musculoskeletal system: Secondary | ICD-10-CM | POA: Diagnosis not present

## 2020-12-10 DIAGNOSIS — M6281 Muscle weakness (generalized): Secondary | ICD-10-CM | POA: Diagnosis not present

## 2020-12-10 DIAGNOSIS — R2681 Unsteadiness on feet: Secondary | ICD-10-CM | POA: Diagnosis not present

## 2020-12-11 DIAGNOSIS — Z9181 History of falling: Secondary | ICD-10-CM | POA: Diagnosis not present

## 2020-12-11 DIAGNOSIS — R2681 Unsteadiness on feet: Secondary | ICD-10-CM | POA: Diagnosis not present

## 2020-12-11 DIAGNOSIS — R531 Weakness: Secondary | ICD-10-CM | POA: Diagnosis not present

## 2020-12-11 DIAGNOSIS — M6281 Muscle weakness (generalized): Secondary | ICD-10-CM | POA: Diagnosis not present

## 2020-12-11 DIAGNOSIS — R29898 Other symptoms and signs involving the musculoskeletal system: Secondary | ICD-10-CM | POA: Diagnosis not present

## 2020-12-11 DIAGNOSIS — R41841 Cognitive communication deficit: Secondary | ICD-10-CM | POA: Diagnosis not present

## 2020-12-14 DIAGNOSIS — R2681 Unsteadiness on feet: Secondary | ICD-10-CM | POA: Diagnosis not present

## 2020-12-14 DIAGNOSIS — Z9181 History of falling: Secondary | ICD-10-CM | POA: Diagnosis not present

## 2020-12-14 DIAGNOSIS — R531 Weakness: Secondary | ICD-10-CM | POA: Diagnosis not present

## 2020-12-14 DIAGNOSIS — M6281 Muscle weakness (generalized): Secondary | ICD-10-CM | POA: Diagnosis not present

## 2020-12-14 DIAGNOSIS — R29898 Other symptoms and signs involving the musculoskeletal system: Secondary | ICD-10-CM | POA: Diagnosis not present

## 2020-12-14 DIAGNOSIS — R41841 Cognitive communication deficit: Secondary | ICD-10-CM | POA: Diagnosis not present

## 2020-12-15 DIAGNOSIS — M4856XA Collapsed vertebra, not elsewhere classified, lumbar region, initial encounter for fracture: Secondary | ICD-10-CM | POA: Diagnosis not present

## 2020-12-15 DIAGNOSIS — M545 Low back pain, unspecified: Secondary | ICD-10-CM | POA: Diagnosis not present

## 2020-12-16 ENCOUNTER — Telehealth: Payer: Self-pay | Admitting: Internal Medicine

## 2020-12-16 DIAGNOSIS — Z9181 History of falling: Secondary | ICD-10-CM | POA: Diagnosis not present

## 2020-12-16 DIAGNOSIS — R2681 Unsteadiness on feet: Secondary | ICD-10-CM | POA: Diagnosis not present

## 2020-12-16 DIAGNOSIS — M6281 Muscle weakness (generalized): Secondary | ICD-10-CM | POA: Diagnosis not present

## 2020-12-16 DIAGNOSIS — R29898 Other symptoms and signs involving the musculoskeletal system: Secondary | ICD-10-CM | POA: Diagnosis not present

## 2020-12-16 DIAGNOSIS — R531 Weakness: Secondary | ICD-10-CM | POA: Diagnosis not present

## 2020-12-16 DIAGNOSIS — R41841 Cognitive communication deficit: Secondary | ICD-10-CM | POA: Diagnosis not present

## 2020-12-16 NOTE — Telephone Encounter (Signed)
Patient spouse calling in  Says patients other provider Dr. Rolena Infante wants to perform back surgery  Patient & spouse would like Dr. Enis Slipper input on this  Please call [phone]

## 2020-12-17 DIAGNOSIS — M6281 Muscle weakness (generalized): Secondary | ICD-10-CM | POA: Diagnosis not present

## 2020-12-17 DIAGNOSIS — R2681 Unsteadiness on feet: Secondary | ICD-10-CM | POA: Diagnosis not present

## 2020-12-17 DIAGNOSIS — R29898 Other symptoms and signs involving the musculoskeletal system: Secondary | ICD-10-CM | POA: Diagnosis not present

## 2020-12-17 DIAGNOSIS — Z9181 History of falling: Secondary | ICD-10-CM | POA: Diagnosis not present

## 2020-12-17 DIAGNOSIS — R531 Weakness: Secondary | ICD-10-CM | POA: Diagnosis not present

## 2020-12-17 DIAGNOSIS — R41841 Cognitive communication deficit: Secondary | ICD-10-CM | POA: Diagnosis not present

## 2020-12-17 NOTE — Telephone Encounter (Signed)
I have not seen Marcie Bal since June.  When it looks like Dr. Posey Pronto was taking care of her at the rehab facility.  Barnabas Lister should ask Dr. Posey Pronto.  Thanks

## 2020-12-18 DIAGNOSIS — M6281 Muscle weakness (generalized): Secondary | ICD-10-CM | POA: Diagnosis not present

## 2020-12-18 DIAGNOSIS — R29898 Other symptoms and signs involving the musculoskeletal system: Secondary | ICD-10-CM | POA: Diagnosis not present

## 2020-12-18 DIAGNOSIS — R41841 Cognitive communication deficit: Secondary | ICD-10-CM | POA: Diagnosis not present

## 2020-12-18 DIAGNOSIS — R2681 Unsteadiness on feet: Secondary | ICD-10-CM | POA: Diagnosis not present

## 2020-12-18 DIAGNOSIS — R531 Weakness: Secondary | ICD-10-CM | POA: Diagnosis not present

## 2020-12-18 DIAGNOSIS — Z9181 History of falling: Secondary | ICD-10-CM | POA: Diagnosis not present

## 2020-12-18 NOTE — Telephone Encounter (Signed)
Notified pt husband Barnabas Lister) with MD response,,/lmb

## 2020-12-20 DIAGNOSIS — Z20828 Contact with and (suspected) exposure to other viral communicable diseases: Secondary | ICD-10-CM | POA: Diagnosis not present

## 2020-12-21 DIAGNOSIS — R531 Weakness: Secondary | ICD-10-CM | POA: Diagnosis not present

## 2020-12-21 DIAGNOSIS — R2681 Unsteadiness on feet: Secondary | ICD-10-CM | POA: Diagnosis not present

## 2020-12-21 DIAGNOSIS — R41841 Cognitive communication deficit: Secondary | ICD-10-CM | POA: Diagnosis not present

## 2020-12-21 DIAGNOSIS — Z9181 History of falling: Secondary | ICD-10-CM | POA: Diagnosis not present

## 2020-12-21 DIAGNOSIS — R29898 Other symptoms and signs involving the musculoskeletal system: Secondary | ICD-10-CM | POA: Diagnosis not present

## 2020-12-21 DIAGNOSIS — M6281 Muscle weakness (generalized): Secondary | ICD-10-CM | POA: Diagnosis not present

## 2020-12-23 DIAGNOSIS — R531 Weakness: Secondary | ICD-10-CM | POA: Diagnosis not present

## 2020-12-23 DIAGNOSIS — R2681 Unsteadiness on feet: Secondary | ICD-10-CM | POA: Diagnosis not present

## 2020-12-23 DIAGNOSIS — M6281 Muscle weakness (generalized): Secondary | ICD-10-CM | POA: Diagnosis not present

## 2020-12-23 DIAGNOSIS — Z9181 History of falling: Secondary | ICD-10-CM | POA: Diagnosis not present

## 2020-12-23 DIAGNOSIS — R29898 Other symptoms and signs involving the musculoskeletal system: Secondary | ICD-10-CM | POA: Diagnosis not present

## 2020-12-23 DIAGNOSIS — R41841 Cognitive communication deficit: Secondary | ICD-10-CM | POA: Diagnosis not present

## 2020-12-24 DIAGNOSIS — R29898 Other symptoms and signs involving the musculoskeletal system: Secondary | ICD-10-CM | POA: Diagnosis not present

## 2020-12-24 DIAGNOSIS — M6281 Muscle weakness (generalized): Secondary | ICD-10-CM | POA: Diagnosis not present

## 2020-12-24 DIAGNOSIS — Z9181 History of falling: Secondary | ICD-10-CM | POA: Diagnosis not present

## 2020-12-24 DIAGNOSIS — R2681 Unsteadiness on feet: Secondary | ICD-10-CM | POA: Diagnosis not present

## 2020-12-24 DIAGNOSIS — R41841 Cognitive communication deficit: Secondary | ICD-10-CM | POA: Diagnosis not present

## 2020-12-24 DIAGNOSIS — R531 Weakness: Secondary | ICD-10-CM | POA: Diagnosis not present

## 2020-12-25 DIAGNOSIS — R29898 Other symptoms and signs involving the musculoskeletal system: Secondary | ICD-10-CM | POA: Diagnosis not present

## 2020-12-25 DIAGNOSIS — R41841 Cognitive communication deficit: Secondary | ICD-10-CM | POA: Diagnosis not present

## 2020-12-25 DIAGNOSIS — Z9181 History of falling: Secondary | ICD-10-CM | POA: Diagnosis not present

## 2020-12-25 DIAGNOSIS — M6281 Muscle weakness (generalized): Secondary | ICD-10-CM | POA: Diagnosis not present

## 2020-12-25 DIAGNOSIS — R2681 Unsteadiness on feet: Secondary | ICD-10-CM | POA: Diagnosis not present

## 2020-12-25 DIAGNOSIS — R531 Weakness: Secondary | ICD-10-CM | POA: Diagnosis not present

## 2020-12-27 ENCOUNTER — Encounter (HOSPITAL_COMMUNITY): Payer: Self-pay

## 2020-12-27 ENCOUNTER — Emergency Department (HOSPITAL_COMMUNITY): Payer: Medicare Other

## 2020-12-27 ENCOUNTER — Emergency Department (HOSPITAL_COMMUNITY)
Admission: EM | Admit: 2020-12-27 | Discharge: 2020-12-30 | Disposition: A | Discharge status: Home / Self Care | Payer: Medicare Other | Attending: Emergency Medicine | Admitting: Emergency Medicine

## 2020-12-27 ENCOUNTER — Other Ambulatory Visit: Payer: Self-pay

## 2020-12-27 DIAGNOSIS — S0990XA Unspecified injury of head, initial encounter: Secondary | ICD-10-CM | POA: Diagnosis not present

## 2020-12-27 DIAGNOSIS — R41841 Cognitive communication deficit: Secondary | ICD-10-CM | POA: Diagnosis not present

## 2020-12-27 DIAGNOSIS — F339 Major depressive disorder, recurrent, unspecified: Secondary | ICD-10-CM | POA: Diagnosis present

## 2020-12-27 DIAGNOSIS — Y900 Blood alcohol level of less than 20 mg/100 ml: Secondary | ICD-10-CM | POA: Diagnosis not present

## 2020-12-27 DIAGNOSIS — I10 Essential (primary) hypertension: Secondary | ICD-10-CM | POA: Insufficient documentation

## 2020-12-27 DIAGNOSIS — R531 Weakness: Secondary | ICD-10-CM | POA: Diagnosis not present

## 2020-12-27 DIAGNOSIS — R42 Dizziness and giddiness: Secondary | ICD-10-CM | POA: Diagnosis not present

## 2020-12-27 DIAGNOSIS — Z7982 Long term (current) use of aspirin: Secondary | ICD-10-CM | POA: Insufficient documentation

## 2020-12-27 DIAGNOSIS — W01198A Fall on same level from slipping, tripping and stumbling with subsequent striking against other object, initial encounter: Secondary | ICD-10-CM | POA: Insufficient documentation

## 2020-12-27 DIAGNOSIS — Z85828 Personal history of other malignant neoplasm of skin: Secondary | ICD-10-CM | POA: Insufficient documentation

## 2020-12-27 DIAGNOSIS — R45851 Suicidal ideations: Secondary | ICD-10-CM | POA: Diagnosis present

## 2020-12-27 DIAGNOSIS — Z79899 Other long term (current) drug therapy: Secondary | ICD-10-CM | POA: Diagnosis not present

## 2020-12-27 DIAGNOSIS — F333 Major depressive disorder, recurrent, severe with psychotic symptoms: Secondary | ICD-10-CM | POA: Insufficient documentation

## 2020-12-27 DIAGNOSIS — Z20822 Contact with and (suspected) exposure to covid-19: Secondary | ICD-10-CM | POA: Diagnosis not present

## 2020-12-27 DIAGNOSIS — F4323 Adjustment disorder with mixed anxiety and depressed mood: Secondary | ICD-10-CM | POA: Diagnosis not present

## 2020-12-27 DIAGNOSIS — M6281 Muscle weakness (generalized): Secondary | ICD-10-CM | POA: Diagnosis not present

## 2020-12-27 DIAGNOSIS — S199XXA Unspecified injury of neck, initial encounter: Secondary | ICD-10-CM | POA: Diagnosis not present

## 2020-12-27 DIAGNOSIS — F332 Major depressive disorder, recurrent severe without psychotic features: Secondary | ICD-10-CM | POA: Diagnosis present

## 2020-12-27 DIAGNOSIS — R29898 Other symptoms and signs involving the musculoskeletal system: Secondary | ICD-10-CM | POA: Diagnosis not present

## 2020-12-27 DIAGNOSIS — Z9181 History of falling: Secondary | ICD-10-CM | POA: Diagnosis not present

## 2020-12-27 DIAGNOSIS — F29 Unspecified psychosis not due to a substance or known physiological condition: Secondary | ICD-10-CM | POA: Diagnosis not present

## 2020-12-27 DIAGNOSIS — T1491XA Suicide attempt, initial encounter: Secondary | ICD-10-CM | POA: Insufficient documentation

## 2020-12-27 DIAGNOSIS — W19XXXA Unspecified fall, initial encounter: Secondary | ICD-10-CM | POA: Diagnosis not present

## 2020-12-27 DIAGNOSIS — R2681 Unsteadiness on feet: Secondary | ICD-10-CM | POA: Diagnosis not present

## 2020-12-27 LAB — CBC WITH DIFFERENTIAL/PLATELET
Abs Immature Granulocytes: 0.01 10*3/uL (ref 0.00–0.07)
Basophils Absolute: 0 10*3/uL (ref 0.0–0.1)
Basophils Relative: 1 %
Eosinophils Absolute: 0.1 10*3/uL (ref 0.0–0.5)
Eosinophils Relative: 3 %
HCT: 36.8 % (ref 36.0–46.0)
Hemoglobin: 12 g/dL (ref 12.0–15.0)
Immature Granulocytes: 0 %
Lymphocytes Relative: 30 %
Lymphs Abs: 1.3 10*3/uL (ref 0.7–4.0)
MCH: 31.3 pg (ref 26.0–34.0)
MCHC: 32.6 g/dL (ref 30.0–36.0)
MCV: 95.8 fL (ref 80.0–100.0)
Monocytes Absolute: 0.6 10*3/uL (ref 0.1–1.0)
Monocytes Relative: 14 %
Neutro Abs: 2.3 10*3/uL (ref 1.7–7.7)
Neutrophils Relative %: 52 %
Platelets: 158 10*3/uL (ref 150–400)
RBC: 3.84 MIL/uL — ABNORMAL LOW (ref 3.87–5.11)
RDW: 13.4 % (ref 11.5–15.5)
WBC: 4.3 10*3/uL (ref 4.0–10.5)
nRBC: 0 % (ref 0.0–0.2)

## 2020-12-27 LAB — COMPREHENSIVE METABOLIC PANEL
ALT: 15 U/L (ref 0–44)
AST: 17 U/L (ref 15–41)
Albumin: 3.3 g/dL — ABNORMAL LOW (ref 3.5–5.0)
Alkaline Phosphatase: 53 U/L (ref 38–126)
Anion gap: 4 — ABNORMAL LOW (ref 5–15)
BUN: 29 mg/dL — ABNORMAL HIGH (ref 8–23)
CO2: 31 mmol/L (ref 22–32)
Calcium: 8.8 mg/dL — ABNORMAL LOW (ref 8.9–10.3)
Chloride: 105 mmol/L (ref 98–111)
Creatinine, Ser: 0.52 mg/dL (ref 0.44–1.00)
GFR, Estimated: >60 mL/min (ref >60)
Glucose, Bld: 102 mg/dL — ABNORMAL HIGH (ref 70–99)
Potassium: 4.1 mmol/L (ref 3.5–5.1)
Sodium: 140 mmol/L (ref 135–145)
Total Bilirubin: 0.4 mg/dL (ref 0.3–1.2)
Total Protein: 6 g/dL — ABNORMAL LOW (ref 6.5–8.1)

## 2020-12-27 LAB — URINALYSIS, ROUTINE W REFLEX MICROSCOPIC
Bilirubin Urine: NEGATIVE
Glucose, UA: NEGATIVE mg/dL
Hgb urine dipstick: NEGATIVE
Ketones, ur: NEGATIVE mg/dL
Leukocytes,Ua: NEGATIVE
Nitrite: NEGATIVE
Protein, ur: NEGATIVE mg/dL
Specific Gravity, Urine: 1.016 (ref 1.005–1.030)
pH: 7 (ref 5.0–8.0)

## 2020-12-27 LAB — RAPID URINE DRUG SCREEN, HOSP PERFORMED
Amphetamines: NOT DETECTED
Barbiturates: NOT DETECTED
Benzodiazepines: NOT DETECTED
Cocaine: NOT DETECTED
Opiates: NOT DETECTED
Tetrahydrocannabinol: NOT DETECTED

## 2020-12-27 LAB — ETHANOL: Alcohol, Ethyl (B): <10 mg/dL (ref <10)

## 2020-12-27 NOTE — ED Notes (Signed)
Pt dressed in burgundy scrubs. Belongings locked away.

## 2020-12-27 NOTE — ED Provider Notes (Signed)
[facility] DEPT Provider Note   CSN: [id number] Arrival date & time: 12/27/20  1336     History Chief Complaint  Patient presents with   Suicide Attempt    JAMIKA SADEK is a 80 y.o. female.  HPI Patient presents via EMS after striking her head following a fall.  Initially patient is reluctant to provide full details.  But she eventually acknowledges that she was spitting around trying to fall and hit her head in a suicide attempt.  She acknowledges dealing with depression for some time.  Currently she has pain only in the right lateral lower neck, described as sore, moderate, worse with motion.  She denies headache, weakness, numbness, speech difficulty, dyspnea, chest pain.  No recent medication change, use, activity change.    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   Pain of left hand 02/14/2019   Tibialis posterior tendinitis, right 07/08/2016   Ankle sprain 06/30/2016   Peripheral vascular disease ([place]) 04/03/2016   Ankle pain, left 03/31/2016   Sore in nose 03/31/2016   Well adult exam 09/25/2015   Adjustment disorder with mixed anxiety and depressed mood 02/03/2015   Allergic rhinitis 05/29/2014   Rash 06/10/2013   URI (upper respiratory infection) 03/08/2012   Lipoma 11/04/2009   PARESTHESIA 12/26/2008   Acute maxillary sinusitis 11/22/2007   Weight loss, abnormal 08/24/2007   Vitamin D deficiency 02/14/2007   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   Osteoporosis 09/01/2006    Past Surgical History:  Procedure Laterality Date   ANTERIOR APPROACH HEMI HIP ARTHROPLASTY Right 09/04/2020   Procedure: ANTERIOR APPROACH HEMI HIP ARTHROPLASTY;  Surgeon: Rod Can, MD;  Location: [facility];  Service: Orthopedics;  Laterality: Right;   [place]   COLONOSCOPY     GANGLION CYST EXCISION     x2 left and right arm   HYSTEROSCOPY WITH D & C N/A 07/15/2013   Procedure: DILATATION AND CURETTAGE /HYSTEROSCOPY;  Surgeon: Lovenia Kim, MD;  Location: [facility];  Service: Gynecology;  Laterality: N/A;   KYPHOPLASTY Bilateral 05/04/2020   Procedure: KYPHOPLASTY THORACIC TWELVE;  Surgeon: Vallarie Mare, MD;  Location: [facility];  Service: Neurosurgery;  Laterality: Bilateral;   MANDIBLE SURGERY  1954   WISDOM TOOTH EXTRACTION       OB History   No obstetric history on file.     Family History  Problem Relation Age of Onset   Hypertension Mother    Heart disease Father    Cancer Father     Social History   Tobacco Use   Smoking status: Never   Smokeless tobacco: Never  Vaping Use   Vaping Use: Never used  Substance Use Topics   Alcohol use: No   Drug use: No    Home Medications Prior to Admission medications    Medication Sig Start Date End Date Taking? Authorizing Provider  acetaminophen (TYLENOL) 500 MG tablet Take 1,000 mg by mouth every 6 (six) hours as needed for moderate pain, mild pain, headache or fever.    [provider]  ASPIRIN 81 PO Take 1 tablet by mouth daily.    [provider]  Calcium Carbonate (CALCIUM-CARB 600 PO) Take 1 tablet by mouth 2 (two) times daily.    [provider]  cetirizine (ZYRTEC ALLERGY) 10 MG tablet Take 1 tablet (10 mg total) by mouth daily. 10/28/19 11/24/20  Plotnikov, [name], MD  Cholecalciferol (VITAMIN D3) 2000 units capsule Take 1 capsule (2,000 Units total) by mouth daily. 12/05/16   Plotnikov, [name], MD  Cobalamin Combinations (VITAMIN B12-FOLIC ACID PO) Place 1 lozenge under the tongue in the morning.    [provider]  docusate sodium (COLACE) 100 MG capsule Take 100 mg by mouth at bedtime.    [provider]  escitalopram (LEXAPRO) 10 MG tablet Take 10 mg by mouth every evening. 07/28/20   [provider]  gabapentin (NEURONTIN) 100 MG capsule Take 1 capsule (100 mg total) by mouth 3 (three) times daily. 06/08/20   Plotnikov, [name], MD  melatonin 5 MG TABS Take 5 mg by mouth at bedtime.    [provider]  Multiple Vitamins-Minerals (PRESERVISION AREDS 2 PO) Take 1 capsule by mouth 2 (two) times daily.    [provider]  Nutritional Supplements (RESOURCE 2.0 PO) Take 180 mLs by mouth daily at 2 PM.    [provider]  polyethylene glycol (MIRALAX / GLYCOLAX) 17 g packet Take 17 g by mouth daily as needed for moderate constipation or mild constipation. 09/07/20   Rai, Ripudeep K, MD  SALONPAS 3.02-13-08 % PTCH Apply 1 patch topically See admin instructions. Apply 1 patch to the lower back and remove (at most) after 8 hours- once a day    [provider]  senna-docusate (SENOKOT-S) 8.6-50 MG tablet Take 2 tablets by mouth daily.    [provider]  sodium  phosphate (FLEET) 7-19 GM/118ML ENEM Place 1 enema rectally daily as needed for severe constipation.    [provider]    Allergies    Alendronate sodium, Risedronate sodium, Tramadol, and Keflex [cephalexin]  Review of Systems   Review of Systems  Constitutional:        Per HPI, otherwise negative  HENT:         Per HPI, otherwise negative  Respiratory:         Per HPI, otherwise negative  Cardiovascular:        Per HPI, otherwise negative  Gastrointestinal:  Negative for vomiting.  Endocrine:       Negative aside from HPI  Genitourinary:        Neg aside from HPI   Musculoskeletal:  Per HPI, otherwise negative  Skin: Negative.   Neurological:  Negative for syncope.   Physical Exam Updated Vital Signs BP (!) 174/84   Pulse 73   Temp 97.9 F (36.6 C) (Oral)   Resp 16   Ht 5\' 4"  (1.626 m)   Wt 39 kg   SpO2 96%   BMI 14.76 kg/m   Physical Exam Vitals and nursing note reviewed.  Constitutional:      General: She is not in acute distress.    Appearance: She is well-developed.  HENT:     Head: Normocephalic and atraumatic.  Eyes:     Conjunctiva/sclera: Conjunctivae normal.  Neck:   Cardiovascular:     Rate and Rhythm: Normal rate and regular rhythm.  Pulmonary:     Effort: Pulmonary effort is normal. No respiratory distress.     Breath sounds: Normal breath sounds. No stridor.  Abdominal:     General: There is no distension.  Musculoskeletal:     Cervical back: Normal range of motion and neck supple.  Skin:    General: Skin is warm and dry.  Neurological:     Mental Status: She is alert and oriented to person, place, and time.     Cranial Nerves: No cranial nerve deficit.    ED Results / Procedures / Treatments   Labs (all labs ordered are listed, but only abnormal results are displayed) Labs Reviewed  COMPREHENSIVE METABOLIC PANEL - Abnormal; Notable for the following components:      Result Value   Glucose, Bld 102 (*)    BUN 29  (*)    Calcium 8.8 (*)    Total Protein 6.0 (*)    Albumin 3.3 (*)    Anion gap 4 (*)    All other components within normal limits  CBC WITH DIFFERENTIAL/PLATELET - Abnormal; Notable for the following components:   RBC 3.84 (*)    All other components within normal limits  ETHANOL  URINALYSIS, ROUTINE W REFLEX MICROSCOPIC  RAPID URINE DRUG SCREEN, HOSP PERFORMED    EKG None  Radiology CT Head Wo Contrast  Result Date: 12/27/2020 CLINICAL DATA:  Trauma.  Pain. EXAM: CT HEAD WITHOUT CONTRAST CT CERVICAL SPINE WITHOUT CONTRAST TECHNIQUE: Multidetector CT imaging of the head and cervical spine was performed following the standard protocol without intravenous contrast. Multiplanar CT image reconstructions of the cervical spine were also generated. COMPARISON:  None. FINDINGS: CT HEAD FINDINGS Brain: No subdural, epidural, or subarachnoid hemorrhage. Ventricles and sulci are unremarkable. Cerebellum, brainstem, and basal cisterns are normal. Scattered white matter changes identified. No acute cortical ischemia or infarct. Vascular: No hyperdense vessel or unexpected calcification. Skull: Normal. Negative for fracture or focal lesion. Sinuses/Orbits: No acute finding. Other: None. CT CERVICAL SPINE FINDINGS Alignment: Normal. Skull base and vertebrae: No acute fracture. No primary bone lesion or focal pathologic process. Soft tissues and spinal canal: No prevertebral fluid or swelling. No visible canal hematoma. Disc levels:  Multilevel degenerative disc disease. Upper chest: Scarring in the apices. Other: No other abnormalities. IMPRESSION: 1. No acute intracranial abnormalities. 2. No fracture or traumatic malalignment in the cervical spine. Electronically Signed   By: Dorise Bullion III M.D.   On: 12/27/2020 15:15   CT Cervical Spine Wo Contrast  Result Date: 12/27/2020 CLINICAL DATA:  Trauma.  Pain. EXAM: CT HEAD WITHOUT CONTRAST CT CERVICAL SPINE WITHOUT CONTRAST TECHNIQUE: Multidetector CT  imaging of the head and cervical spine was performed following the standard protocol without intravenous contrast. Multiplanar CT image  reconstructions of the cervical spine were also generated. COMPARISON:  None. FINDINGS: CT HEAD FINDINGS Brain: No subdural, epidural, or subarachnoid hemorrhage. Ventricles and sulci are unremarkable. Cerebellum, brainstem, and basal cisterns are normal. Scattered white matter changes identified. No acute cortical ischemia or infarct. Vascular: No hyperdense vessel or unexpected calcification. Skull: Normal. Negative for fracture or focal lesion. Sinuses/Orbits: No acute finding. Other: None. CT CERVICAL SPINE FINDINGS Alignment: Normal. Skull base and vertebrae: No acute fracture. No primary bone lesion or focal pathologic process. Soft tissues and spinal canal: No prevertebral fluid or swelling. No visible canal hematoma. Disc levels:  Multilevel degenerative disc disease. Upper chest: Scarring in the apices. Other: No other abnormalities. IMPRESSION: 1. No acute intracranial abnormalities. 2. No fracture or traumatic malalignment in the cervical spine. Electronically Signed   By: Dorise Bullion III M.D.   On: 12/27/2020 15:15    Procedures Procedures   Medications Ordered in ED Medications - No data to display  ED Course  I have reviewed the triage vital signs and the nursing notes.  Pertinent labs & imaging results that were available during my care of the patient were reviewed by me and considered in my medical decision making (see chart for details).  Update: Patient in no distress.  Labs reviewed, unremarkable, within normal limits.  Head CT, neck CT reviewed fracture, no intracranial injury.  Given the patient's presentation here after attempting to spin, struck her head in an effort to kill her self she has been medically cleared for behavioral health evaluation. MDM Rules/Calculators/A&P MDM Number of Diagnoses or Management Options Suicide attempt  [facility]): new, needed workup Traumatic injury of head, initial encounter: new, needed workup   Amount and/or Complexity of Data Reviewed Clinical lab tests: ordered and reviewed Tests in the radiology section of CPT: ordered and reviewed Tests in the medicine section of CPT: ordered and reviewed Decide to obtain previous medical records or to obtain history from someone other than the patient: yes Obtain history from someone other than the patient: yes Review and summarize past medical records: yes Independent visualization of images, tracings, or specimens: yes  Risk of Complications, Morbidity, and/or Mortality Presenting problems: high Diagnostic procedures: high Management options: high  Critical Care Total time providing critical care: < 30 minutes  Patient Progress Patient progress: stable   Final Clinical Impression(s) / ED Diagnoses Final diagnoses:  Suicide attempt [facility])  Traumatic injury of head, initial encounter     Carmin Muskrat, MD 12/27/20 1927

## 2020-12-27 NOTE — BH Assessment (Signed)
Comprehensive Clinical Assessment (CCA) Note  12/27/2020 Tanya Walker [id number]  DISPOSITION: Gave clinical report to Quintella Reichert, NP who recommended Pt be transferred to a geriatric-psychiatry facility. Appropriate facilities will be contacted for placement. Notified Dr. Shirlyn Goltz and Clarene Reamer, RN of recommendation via secure message.  The patient demonstrates the following risk factors for suicide: Chronic risk factors for suicide include: psychiatric disorder of major depressive disorder and medical illness dementia . Acute risk factors for suicide include: social withdrawal/isolation. Protective factors for this patient include: responsibility to others (children, family). Considering these factors, the overall suicide risk at this point appears to be high. Patient is not appropriate for outpatient follow up.  Agency ED from 12/27/2020 in [facility] DEPT ED to Hosp-Admission (Discharged) from 09/03/2020 in [place] ED to Hosp-Admission (Discharged) from 08/18/2020 in [facility] [place] [facility]  C-SSRS RISK CATEGORY High Risk No Risk No Risk      Pt is an 80 year old married female who presents unaccompanied to [facility] via EMS reporting depressive symptoms and suicidal ideation. Pt's medical record indicates she has a history of major depressive disorder and dementia. Pt was unable to answer some questions due to memory impairment. Pt says she was brought to [facility] "because I tried to kill myself." Pt reports she was intentionally spinning around so she would fall on concrete. She cannot identify anything that happened recently that triggered this attempt. Pt says she is still suicidal and wants to die. She denies any history of previous suicide attempts. Pt says she "has been trying to get by" but feels very depressed. Pt acknowledges symptoms including crying spells, social withdrawal, loss of interest in  usual pleasures, fatigue, irritability, decreased concentration, decreased sleep, decreased appetite and feelings of worthlessness and hopelessness. Pt says she cannot sleep. She says she has been losing weight and today Pt weighs 86 pounds. She reports recent visual hallucinations which she cannot describe. Pt's medical record indicates on 11/20/2020 she reported hallucinations of seeing little boys in her room and water coming from the ceiling in her room. She denies homicidal ideation or history of aggression. She denies alcohol or other substance use.  Pt lives with her husband, Tanya Walker, at [facility]. She says she has two children. Pt gave permission to speak with her daughter, Tanya Walker. TTS attempted to contact Ms Stegall at [phone] and call went to full voicemail.  Pt was unable to answer more detailed questions due to memory impairment.  Pt is covered by blanket, alert and oriented to person, place and situation. She knew the day of her birthday but not the year. Pt speaks in a soft tone, at moderate volume and normal pace. Motor behavior appears normal. Eye contact is minimal. Pt's mood is depressed and affect is congruent with mood. Thought process is coherent and relevant. There is no indication Pt is currently responding to internal stimuli or experiencing delusional thought content. Pt was cooperative throughout assessment.   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 Other (Comment) (EMS)  Referral name: No data recorded Referral phone number: No data recorded  Whom do you see for routine medical problems? No data recorded Practice/Facility Name:  No data  recorded Practice/Facility Phone Number: No data recorded Name of Contact: No data recorded Contact Number: No data recorded Contact Fax Number: No data recorded Prescriber Name: No data recorded Prescriber Address (if known): No data recorded  What Is the Reason for Your Visit/Call Today? Pt reports she tried to kill herself today by intentionally falling on concrete. She reports feeling depressed, hopeless, and anxious. Pt has dementia and has difficulty answering some questions.  How Long Has This Been Causing You Problems? > than 6 months  What Do You Feel Would Help You the Most Today? Treatment for Depression or other mood problem; Medication(s)   Have You Recently Been in Any Inpatient Treatment (Hospital/Detox/Crisis Center/28-Day Program)? No data recorded Name/Location of Program/Hospital:No data recorded How Long Were You There? No data recorded When Were You Discharged? No data recorded  Have You Ever Received Services From [facility] Before? No data recorded Who Do You See at [facility]? No data recorded  Have You Recently Had Any Thoughts About Hurting Yourself? Yes  Are You Planning to Commit Suicide/Harm Yourself At This time? Yes   Have you Recently Had Thoughts About Hurting Someone [place]? No  Explanation: No data recorded  Have You Used Any Alcohol or Drugs in the Past 24 Hours? No  How Long Ago Did You Use Drugs or Alcohol? No data recorded What Did You Use and How Much? No data recorded  Do You Currently Have a Therapist/Psychiatrist? Yes  Name of Therapist/Psychiatrist: Pt cannot remember name   Have You Been Recently Discharged From Any Office Practice or Programs? No  Explanation of Discharge From Practice/Program: No data recorded    CCA Screening Triage Referral Assessment Type of Contact: Tele-Assessment  Is this Initial or Reassessment? Initial Assessment  Date Telepsych consult ordered in [facility]:  12/27/20  Time Telepsych consult  ordered in [facility]:  1521   Patient Reported Information Reviewed? No data recorded Patient Left Without Being Seen? No data recorded Reason for Not Completing Assessment: No data recorded  Collateral Involvement: Medical record   Does Patient Have a [place]? No data recorded Name and Contact of Legal Guardian: No data recorded If Minor and Not Living with Parent(s), Who has Custody? NA  Is CPS involved or ever been involved? Never  Is APS involved or ever been involved? Never   Patient Determined To Be At Risk for Harm To Self or Others Based on Review of Patient Reported Information or Presenting Complaint? Yes, for Self-Harm  Method: No data recorded Availability of Means: No data recorded Intent: No data recorded Notification Required: No data recorded Additional Information for Danger to Others Potential: No data recorded Additional Comments for Danger to Others Potential: No data recorded Are There Guns or Other Weapons in Your Home? No data recorded Types of Guns/Weapons: No data recorded Are These Weapons Safely Secured?                            No data recorded Who Could Verify You Are Able To Have These Secured: No data recorded Do You Have any Outstanding Charges, Pending Court Dates, Parole/Probation? No data recorded Contacted To Inform of Risk of Harm To Self or Others: Unable to Contact:   Location of Assessment: [facility]   Does Patient Present under Involuntary Commitment? No  IVC Papers Initial File Date: No data recorded  [place] of Residence: [place]   Patient Currently Receiving the  Following Services: Medication Management   Determination of Need: Emergent (2 hours)   Options For Referral: Inpatient Hospitalization     CCA Biopsychosocial Intake/Chief Complaint:  No data recorded Current Symptoms/Problems: No data recorded  Patient Reported Schizophrenia/Schizoaffective Diagnosis in Past: No   Strengths: Pt has  family support  Preferences: No data recorded Abilities: No data recorded  Type of Services Patient Feels are Needed: No data recorded  Initial Clinical Notes/Concerns: No data recorded  Mental Health Symptoms Depression:   Change in energy/activity; Difficulty Concentrating; Fatigue; Hopelessness; Increase/decrease in appetite; Irritability; Sleep (too much or little); Tearfulness; Weight gain/loss; Worthlessness   Duration of Depressive symptoms:  Greater than two weeks   Mania:   None   Anxiety:    Difficulty concentrating; Fatigue; Irritability; Restlessness; Sleep; Tension; Worrying   Psychosis:   Hallucinations   Duration of Psychotic symptoms:  Greater than six months   Trauma:   None   Obsessions:   None   Compulsions:   None   Inattention:   N/A   Hyperactivity/Impulsivity:   N/A   Oppositional/Defiant Behaviors:   N/A   Emotional Irregularity:   None   Other Mood/Personality Symptoms:   NA    Mental Status Exam Appearance and self-care  Stature:   Average   Weight:   Thin   Clothing:   -- (Cover by blanket)   Grooming:   Normal   Cosmetic use:   None   Posture/gait:   Normal   Motor activity:   Not Remarkable   Sensorium  Attention:   Distractible   Concentration:   Variable   Orientation:   Person; Place; Situation   Recall/memory:   Defective in Recent; Defective in Remote   Affect and Mood  Affect:   Depressed   Mood:   Depressed   Relating  Eye contact:   Fleeting   Facial expression:   Depressed; Anxious   Attitude toward examiner:   Cooperative   Thought and Language  Speech flow:  Slow   Thought content:   Appropriate to Mood and Circumstances   Preoccupation:   None   Hallucinations:   Visual   Organization:  No data recorded  [employer] of Knowledge:   Impoverished by (Comment) (Dementia)   Intelligence:   Average   Abstraction:   Functional   Judgement:    Poor   Reality Testing:   Variable   Insight:   Fair   Decision Making:   Vacilates; Confused   Social Functioning  Social Maturity:   Responsible   Social Judgement:   Normal   Stress  Stressors:   Illness   Coping Ability:   Exhausted; Overwhelmed   Skill Deficits:   Decision making; Self-care   Supports:   Family     Religion: Religion/Spirituality Are You A Religious Person?: Yes What is Your Religious Affiliation?: Christian How Might This Affect Treatment?: NA  Leisure/Recreation: Leisure / Recreation Do You Have Hobbies?: No  Exercise/Diet: Exercise/Diet Do You Exercise?: No Have You Gained or Lost A Significant Amount of Weight in the Past Six Months?: Yes-Lost Number of Pounds Lost?:  (Unknown) Do You Follow a Special Diet?: No Do You Have Any Trouble Sleeping?: Yes Explanation of Sleeping Difficulties: Pt says "I can't sleep"   CCA Employment/Education Employment/Work Situation: Employment / Work Situation Employment Situation: Retired Social research officer, government has Been Impacted by Current Illness: No Has Patient ever Been in Passenger transport manager?: No  Education:     CCA  Family/Childhood History Family and Relationship History: Family history Marital status: Married Does patient have children?: Yes How many children?: 2 How is patient's relationship with their children?: Good  Childhood History:  Childhood History Did patient suffer any verbal/emotional/physical/sexual abuse as a child?: No Did patient suffer from severe childhood neglect?: No Has patient ever been sexually abused/assaulted/raped as an adolescent or adult?: No Was the patient ever a victim of a crime or a disaster?: No Witnessed domestic violence?: No Has patient been affected by domestic violence as an adult?: No  Child/Adolescent Assessment:     CCA Substance Use Alcohol/Drug Use: Alcohol / Drug Use Pain Medications: Denies abuse Prescriptions: Denies abuse Over the  Counter: Denies abuse History of alcohol / drug use?: No history of alcohol / drug abuse Longest period of sobriety (when/how long): NA                         ASAM's:  Six Dimensions of Multidimensional Assessment  Dimension 1:  Acute Intoxication and/or Withdrawal Potential:      Dimension 2:  Biomedical Conditions and Complications:      Dimension 3:  Emotional, Behavioral, or Cognitive Conditions and Complications:     Dimension 4:  Readiness to Change:     Dimension 5:  Relapse, Continued use, or Continued Problem Potential:     Dimension 6:  Recovery/Living Environment:     ASAM Severity Score:    ASAM Recommended Level of Treatment:     Substance use Disorder (SUD)    Recommendations for Services/Supports/Treatments:    DSM5 Diagnoses: Patient Active Problem List   Diagnosis Date Noted   Hallucination 11/09/2020   Scalp hematoma, subsequent encounter 11/09/2020   Osteoarthritis, multiple sites 09/22/2020   HTN (hypertension) 09/16/2020   Blood loss anemia 09/16/2020   Slow transit constipation 09/16/2020   Closed right hip fracture, initial encounter ([place]) 09/03/2020   Alzheimer disease ([place]) 08/21/2020   Vitamin B12 deficiency 08/21/2020   Hyperlipidemia 08/21/2020   Depression, recurrent ([place]) 08/21/2020   Protein-calorie malnutrition, severe 08/19/2020   Encephalopathy acute 08/19/2020   Altered mental status 08/18/2020   Prolonged QT interval 08/18/2020   Thrombocytopenia ([place]) 08/18/2020   Failure to thrive in adult 08/18/2020   Physical deconditioning 08/18/2020   Compression fracture of body of thoracic vertebra ([place]) 06/08/2020   Low back pain 03/27/2020   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   Pain of left hand 02/14/2019   Tibialis posterior tendinitis, right 07/08/2016   Ankle sprain 06/30/2016   Peripheral vascular disease ([place]) 04/03/2016   Ankle pain, left 03/31/2016   Sore in nose 03/31/2016   Well adult exam 09/25/2015    Adjustment disorder with mixed anxiety and depressed mood 02/03/2015   Allergic rhinitis 05/29/2014   Rash 06/10/2013   URI (upper respiratory infection) 03/08/2012   Lipoma 11/04/2009   PARESTHESIA 12/26/2008   Acute maxillary sinusitis 11/22/2007   Weight loss, abnormal 08/24/2007   Vitamin D deficiency 02/14/2007   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

## 2020-12-27 NOTE — ED Notes (Signed)
Cleotis Lema, RN updated daughter of plan of care for pt.

## 2020-12-27 NOTE — ED Triage Notes (Signed)
Pt BIB EMS. Pt coming from [facility] assisted living. Per staff pt tried to attempt suicide by "spinning around and falling trying to hit her head". Pt has hx of anxiety, depression and dementia.

## 2020-12-28 DIAGNOSIS — S0990XA Unspecified injury of head, initial encounter: Secondary | ICD-10-CM | POA: Diagnosis not present

## 2020-12-28 LAB — RESP PANEL BY RT-PCR (FLU A&B, COVID) ARPGX2
Influenza A by PCR: NEGATIVE
Influenza B by PCR: NEGATIVE
SARS Coronavirus 2 by RT PCR: NEGATIVE

## 2020-12-28 NOTE — ED Notes (Signed)
Pt resting well in room. All safety and comfort measures in place with bed in low position. Sitter at bedside.

## 2020-12-28 NOTE — ED Notes (Signed)
Pt given water per request

## 2020-12-28 NOTE — ED Notes (Signed)
Meal tray offered at this time.

## 2020-12-28 NOTE — BH Assessment (Addendum)
[place] Assessment Progress Note   Per Quintella Reichert, NP, this pt requires psychiatric hospitalization at a facility providing specialty care for geriatric patients at this time.  The following facilities have been contacted to seek placement for this pt, with results as noted:  Beds available, information sent, decision pending: [place]  At capacity: [place]   If this voluntary pt is accepted to a facility, please discuss disposition with pt to be sure that she agrees to the plan.  If a facility agrees to accept pt and the plan changes in any way please call the facility to inform them of the change.  Final disposition is pending as of this writing.  Jalene Mullet, [place] Coordinator [phone]

## 2020-12-28 NOTE — ED Notes (Signed)
Sitter present at bedside.

## 2020-12-28 NOTE — BH Assessment (Signed)
Spoke with Pt's daughter, Tanya Walker [phone].  She says Pt has a history of depression and has been increasingly depressed for approximately six months. She states Pt has spinal fractures which have impaired her ability to be independent. She says this loss of independence, such as not being able to drive, has made Pt more depressed. She says Pt attempted suicide once many years ago by overdosing on medication. She states Pt and Pt's husband have been married 58 years and have a good relationship. He lives in independent living at [facility] and Pt recently moved to assisted living at [facility]. Husband's name is Tanya Walker and his cell phone number is [phone]. Ms Tanya Walker says he is currently asleep and it would be best to call him in the morning. Discussed recommendation for inpatient geriatric-psychiatry and Ms Tanya Walker agreed with plan.   Tanya Walker, [facility], [facility] Triage Specialist [phone]

## 2020-12-28 NOTE — ED Notes (Signed)
Pt care assumed at this time. Pt NAD, a/ox4. Pt states she has a broken hip and wasn't sure if she tried to hurt herself. Pt states she might have been out of her mind. Pt denies SI/HI and auditory or visual hallucinations.

## 2020-12-28 NOTE — ED Notes (Signed)
Patient urinated on her self in bed. Patient walked to the bathroom and assisted to changed into clean dry scrub pants/new brief.  New linens on bed and covered with warm blanket.

## 2020-12-28 NOTE — ED Notes (Signed)
Bed alarm applied.

## 2020-12-28 NOTE — ED Notes (Signed)
Spouse at bedside at this time.

## 2020-12-28 NOTE — ED Notes (Signed)
Meal tray provided at this time.

## 2020-12-29 DIAGNOSIS — F333 Major depressive disorder, recurrent, severe with psychotic symptoms: Secondary | ICD-10-CM | POA: Diagnosis not present

## 2020-12-29 DIAGNOSIS — R9431 Abnormal electrocardiogram [ECG] [EKG]: Secondary | ICD-10-CM | POA: Diagnosis not present

## 2020-12-29 DIAGNOSIS — T1491XA Suicide attempt, initial encounter: Secondary | ICD-10-CM | POA: Insufficient documentation

## 2020-12-29 DIAGNOSIS — F4323 Adjustment disorder with mixed anxiety and depressed mood: Secondary | ICD-10-CM | POA: Diagnosis not present

## 2020-12-29 DIAGNOSIS — S0990XA Unspecified injury of head, initial encounter: Secondary | ICD-10-CM | POA: Diagnosis not present

## 2020-12-29 DIAGNOSIS — F332 Major depressive disorder, recurrent severe without psychotic features: Secondary | ICD-10-CM | POA: Diagnosis present

## 2020-12-29 MED ORDER — ACETAMINOPHEN 500 MG PO TABS: 1000.0000 mg | ORAL_TABLET | Freq: Four times a day (QID) | ORAL | Status: DC | PRN

## 2020-12-29 MED ORDER — SENNOSIDES-DOCUSATE SODIUM 8.6-50 MG PO TABS
2.0000 | ORAL_TABLET | Freq: Every day | ORAL | Status: DC
  Administered 2020-12-29 – 2020-12-30 (×2): 2 via ORAL
  Filled 2020-12-29 (×2): qty 2

## 2020-12-29 MED ORDER — DOCUSATE SODIUM 100 MG PO CAPS
100.0000 mg | ORAL_CAPSULE | Freq: Every day | ORAL | Status: DC
  Administered 2020-12-29: 100 mg via ORAL
  Filled 2020-12-29: qty 1

## 2020-12-29 MED ORDER — GABAPENTIN 100 MG PO CAPS
100.0000 mg | ORAL_CAPSULE | Freq: Three times a day (TID) | ORAL | Status: DC
  Administered 2020-12-29 – 2020-12-30 (×4): 100 mg via ORAL
  Filled 2020-12-29 (×4): qty 1

## 2020-12-29 MED ORDER — PROSIGHT PO TABS
1.0000 | ORAL_TABLET | Freq: Two times a day (BID) | ORAL | Status: DC
  Administered 2020-12-29 – 2020-12-30 (×3): 1 via ORAL
  Filled 2020-12-29 (×3): qty 1

## 2020-12-29 MED ORDER — PRESERVISION AREDS 2 PO CAPS: 1.0000 | ORAL_CAPSULE | Freq: Two times a day (BID) | ORAL | Status: DC

## 2020-12-29 MED ORDER — VITAMIN D 25 MCG (1000 UNIT) PO TABS
2000.0000 [IU] | ORAL_TABLET | Freq: Every day | ORAL | Status: DC
  Administered 2020-12-29 – 2020-12-30 (×2): 2000 [IU] via ORAL
  Filled 2020-12-29 (×2): qty 2

## 2020-12-29 MED ORDER — ESCITALOPRAM OXALATE 10 MG PO TABS
10.0000 mg | ORAL_TABLET | Freq: Every evening | ORAL | Status: DC
  Administered 2020-12-29: 10 mg via ORAL
  Filled 2020-12-29: qty 1

## 2020-12-29 MED ORDER — VITAMIN B12-FOLIC ACID 500-400 MCG PO TABS: 1.0000 | ORAL_TABLET | Freq: Every morning | ORAL | Status: DC

## 2020-12-29 MED ORDER — FOLIC ACID 1 MG PO TABS
0.5000 mg | ORAL_TABLET | Freq: Every day | ORAL | Status: DC
  Administered 2020-12-29 – 2020-12-30 (×2): 0.5 mg via ORAL
  Filled 2020-12-29 (×2): qty 1

## 2020-12-29 MED ORDER — MELATONIN 5 MG PO TABS
5.0000 mg | ORAL_TABLET | Freq: Every day | ORAL | Status: DC
  Administered 2020-12-29: 5 mg via ORAL
  Filled 2020-12-29: qty 1

## 2020-12-29 MED ORDER — ASPIRIN 81 MG PO CHEW
81.0000 mg | CHEWABLE_TABLET | Freq: Every day | ORAL | Status: DC
  Administered 2020-12-29 – 2020-12-30 (×2): 81 mg via ORAL
  Filled 2020-12-29 (×2): qty 1

## 2020-12-29 MED ORDER — CAMPHOR-MENTHOL-METHYL SAL 3.1-6-10 % EX PTCH: 1.0000 | MEDICATED_PATCH | CUTANEOUS | Status: DC

## 2020-12-29 MED ORDER — LORATADINE 10 MG PO TABS
10.0000 mg | ORAL_TABLET | Freq: Every day | ORAL | Status: DC
  Administered 2020-12-29 – 2020-12-30 (×2): 10 mg via ORAL
  Filled 2020-12-29 (×2): qty 1

## 2020-12-29 MED ORDER — CYANOCOBALAMIN 500 MCG PO TABS
500.0000 ug | ORAL_TABLET | Freq: Every day | ORAL | Status: DC
  Administered 2020-12-29 – 2020-12-30 (×2): 500 ug via ORAL
  Filled 2020-12-29 (×2): qty 1

## 2020-12-29 MED ORDER — POLYETHYLENE GLYCOL 3350 17 G PO PACK
17.0000 g | PACK | Freq: Every day | ORAL | Status: DC | PRN
  Administered 2020-12-29: 17 g via ORAL
  Filled 2020-12-29: qty 1

## 2020-12-29 NOTE — ED Notes (Signed)
Pt requested to call her husband. This Probation officer noticed pt had a phone number of her husband written on a piece of paper, different from the one in her chart. This Probation officer has updated pt's husbands' number. Pt's husband requesting to be updated about pt's status. Writer notified pt's husband that the RN would call back whenever they had the chance. RN notified. Will continue to monitor pt

## 2020-12-29 NOTE — ED Notes (Signed)
Pt's breakfast has arrived. Pt sitting on the side of the stretcher, pt eating her breakfast. Will continue to monitor pt

## 2020-12-29 NOTE — ED Notes (Signed)
Pt had stated about feeling constipated. This Probation officer took patient to the bathroom and asked pt if she would like to walk around the ED to help move her bowels. Pt was compliant with this. Pt and writer walked to the secretaries desk, 50 ft, then back, in total 100 ft. Pt brushed her teeth, this Primary school teacher about pt feeling constipated. Pt now on her stretcher. Will continue to monitor pt

## 2020-12-29 NOTE — BH Assessment (Signed)
[place] Assessment Progress Note   Per Sheran Fava, NP this voluntary pt continues to require psychiatric hospitalization.  This Probation officer contacted Renee at [facility] about transferring pt, however, she reports that they will not has beds available today.  I then made the following contacts:  [place]: no answer, no option to leave message Rosana Hoes: left message, response pending [place]: left message, response pending Adela Ports: left message; Jeanett Schlein called back later, leaving a voice message for me.  She requests that labs be faxed to her independent of any other documentation.  She agrees to submit them to her provider tomorrow morning ([place], 12/30/20).  I have faxed to her and I have follow up with a voice message.  Decision is pending as of this writing.  Jalene Mullet, [place] Coordinator [phone]

## 2020-12-29 NOTE — ED Notes (Signed)
Pt assisted to restroom by tech Caryl Pina per pt request for female

## 2020-12-29 NOTE — Progress Notes (Addendum)
Inpatient Behavioral [place] placement   Pt meets inpatient criteria per Sheran Fava, NP. No appropriate beds at [facility] per [facility] Wynonia Hazard, RN. [facility] has no availability; this CSW sent a secure chat to [facility] physician and clinical staff along with including behavioral health clinician Jalene Mullet.  Referral was sent to the following facilities:  Destination Service Provider Address Phone Fax  [facility]  [place], [place] [address] [phone]  [facility]  [address] [place], [place] [address] [phone]  [facility]  [address], [place] [address] [phone] [place], [place] [address] [phone]  [facility]  [address] [place] [address] [phone] [facility]  [address] [place] [address] [phone] [facility]  [address], [place] [address] [phone]  [facility]  [address], [place] [address] [phone] [facility]  [address] [place] [address] [facility]  [address] [place] [address] [phone] [facility]  [place], [place] [address] [phone]  [facility]  [address] [place] [address] [phone]  [facility]  [address], [place] [address] [phone] [facility]  [address] [place] [address] [phone] [facility]  [place] [address] [phone]  [facility]  [place], [place] [address] [phone]    Situation ongoing,  CSW will follow up.   Benjaman Kindler, MSW, [facility] 12/29/2020  @ 11:03 PM

## 2020-12-29 NOTE — ED Notes (Signed)
This RN attempted to call patient's spouse. No answer. Will attempt another call soon

## 2020-12-29 NOTE — ED Notes (Signed)
Pt's lunch has arrived. Pt sat up on the side of the stretcher, pt eating her food. Will continue to monitor pt

## 2020-12-29 NOTE — ED Notes (Signed)
Patient's husband informed of plan of care

## 2020-12-29 NOTE — ED Notes (Addendum)
Pt requested to do some ADL'S and to walk to the bathroom. Pt walked to the BR with a steady gait, occasionally leaned forward for support but otherwise did well. Pt back on her stretcher. Will continue to monitor pt

## 2020-12-29 NOTE — Consult Note (Signed)
[facility] Face-to-Face Psychiatry Consult   Reason for Consult:  suicide attempt Referring Physician:  EDP Patient Identification: Tanya Walker MRN:  [id number] Principal Diagnosis: MDD (major depressive disorder), recurrent episode, severe ([place]) Diagnosis:  Principal Problem:   MDD (major depressive disorder), recurrent episode, severe ([place]) Active Problems:   Adjustment disorder with mixed anxiety and depressed mood   Total Time spent with patient: 30 minutes  Subjective:   Tanya Walker is a 80 y.o. female patient admitted with with worsening depressive symptoms and suicide attempt. Patient attempts to a suicide attempt, by throwing herself into the floor. " I know that I am fragile, and I figured if I [place] on the concrete floor. I could kill myself. " Patient continues to deny any immediate stressors that contributed to her suicide attempt. Patient continues to endorse depressive symptoms such as weight loss, socially withdrawn, decreased concentration, loss of interests, confusion, and feelings oh hopelessness. " I get frustrated when I cant balance my books. Or I am unable to do simple addition or subtraction. My husband will ask me where things are and sometimes I cant help him. " SHe reports not sleeping well at this time,due to being in the ED. She denies any active suicidal ideations but endorses passive and anhedonia. " I do not want to fall again but I still don't want to be here. " She is able to contract for safety at this time. She denies any homicidal ideations and or hallucinations at this time. Patient appears to be doing well for herself and her husband, as they both reside in a retirement community, and she still manages most of her ADLs and finances.   HPI:  Pt is an 80 year old married female who presents unaccompanied to [facility] ED via EMS reporting depressive symptoms and suicidal ideation. Pt's medical record indicates she has a history of major depressive disorder and  dementia. Pt was unable to answer some questions due to memory impairment. Pt says she was brought to [facility] "because I tried to kill myself." Pt reports she was intentionally spinning around so she would fall on concrete. She cannot identify anything that happened recently that triggered this attempt. Pt says she is still suicidal and wants to die. She denies any history of previous suicide attempts. Pt says she "has been trying to get by" but feels very depressed. Pt acknowledges symptoms including crying spells, social withdrawal, loss of interest in usual pleasures, fatigue, irritability, decreased concentration, decreased sleep, decreased appetite and feelings of worthlessness and hopelessness. Pt says she cannot sleep. She says she has been losing weight and today Pt weighs 86 pounds. She reports recent visual hallucinations which she cannot describe. Pt's medical record indicates on 11/20/2020 she reported hallucinations of seeing little boys in her room and water coming from the ceiling in her room. She denies homicidal ideation or history of aggression. She denies alcohol or other substance use.  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   Depression    years ago   Headache    sinus headaches   Lipoma of arm    Right   Medical history non-contributory    Osteoporosis    SVD (spontaneous vaginal  delivery)    x 2   Varicose vein    Vitamin D deficiency     Past Surgical History:  Procedure Laterality Date   ANTERIOR APPROACH HEMI HIP ARTHROPLASTY Right 09/04/2020   Procedure: ANTERIOR APPROACH HEMI HIP ARTHROPLASTY;  Surgeon: Rod Can, MD;  Location: [facility];  Service: Orthopedics;  Laterality: Right;   [place]   COLONOSCOPY     GANGLION CYST EXCISION     x2 left and right arm   HYSTEROSCOPY WITH D & C N/A 07/15/2013   Procedure: DILATATION AND CURETTAGE /HYSTEROSCOPY;  Surgeon: Lovenia Kim, MD;  Location: [facility];  Service: Gynecology;  Laterality: N/A;   KYPHOPLASTY Bilateral 05/04/2020   Procedure: KYPHOPLASTY THORACIC TWELVE;  Surgeon: Vallarie Mare, MD;  Location: [place];  Service: Neurosurgery;  Laterality: Bilateral;   MANDIBLE SURGERY  1954   WISDOM TOOTH EXTRACTION     Family History:  Family History  Problem Relation Age of Onset   Hypertension Mother    Heart disease Father    Cancer Father    Family Psychiatric  History: Dementia Social History:  Social History   Substance and Sexual Activity  Alcohol Use No     Social History   Substance and Sexual Activity  Drug Use No    Social History   Socioeconomic History   Marital status: Married    Spouse name: Not on file   Number of children: 1   Years of education: Not on file   Highest education level: Not on file  Occupational History   Occupation: retired  Tobacco Use   Smoking status: Never   Smokeless tobacco: Never  Vaping Use   Vaping Use: Never used  Substance and Sexual Activity   Alcohol use: No   Drug use: No   Sexual activity: Yes    Birth control/protection: Post-menopausal  Other Topics Concern   Not on file  Social History Narrative   GI in [place] for colonoscopy   Dr Ronita Hipps - GYN      Denies Surgical history      Family history of varicose veins   Mother is 49      Regular exercise - NO   Social Determinants of Health   Financial Resource Strain: Not on file  Food Insecurity: Not on file  Transportation Needs: Not on file  Physical Activity: Not on file  Stress: Not on file  Social Connections: Not on file   Additional Social History:    Allergies:   Allergies  Allergen Reactions   Alendronate Sodium Other (See Comments)    Leg cramps and "Allergic," per MAR    Risedronate Sodium Other (See Comments)    Caused the patient to be achy and is "Allergic," per MAR   Tramadol Other (See Comments)    Caused the patient to feel badly    Keflex [Cephalexin] Other (See Comments)    Possible rash    Labs:  Results for orders placed or performed during the hospital encounter of 12/27/20 (from the past 48 hour(s))  Ethanol     Status: None   Collection Time: 12/27/20  2:10 PM  Result Value Ref Range   Alcohol, Ethyl (B) <10 <10 mg/dL    Comment: (NOTE) Lowest detectable limit for serum alcohol is 10 mg/dL.  For medical purposes only. Performed at [facility], [place] [address] [place], [place] [address]   Comprehensive metabolic panel  Status: Abnormal   Collection Time: 12/27/20  2:10 PM  Result Value Ref Range   Sodium 140 135 - 145 mmol/L   Potassium 4.1 3.5 - 5.1 mmol/L   Chloride 105 98 - 111 mmol/L   CO2 31 22 - 32 mmol/L   Glucose, Bld 102 (H) 70 - 99 mg/dL    Comment: Glucose reference range applies only to samples taken after fasting for at least 8 hours.   BUN 29 (H) 8 - 23 mg/dL   Creatinine, Ser 0.52 0.44 - 1.00 mg/dL   Calcium 8.8 (L) 8.9 - 10.3 mg/dL   Total Protein 6.0 (L) 6.5 - 8.1 g/dL   Albumin 3.3 (L) 3.5 - 5.0 g/dL   AST 17 15 - 41 U/L   ALT 15 0 - 44 U/L   Alkaline Phosphatase 53 38 - 126 U/L   Total Bilirubin 0.4 0.3 - 1.2 mg/dL   GFR, Estimated >60 >60 mL/min    Comment: (NOTE) Calculated using the CKD-EPI Creatinine Equation (2021)    Anion gap 4 (L) 5 - 15    Comment: Performed at [facility], [place] [address] [place], [place] [address]  CBC with Differential     Status: Abnormal   Collection Time: 12/27/20  2:10 PM  Result Value Ref Range   WBC 4.3 4.0 - 10.5 K/uL   RBC 3.84 (L) 3.87 - 5.11 MIL/uL   Hemoglobin 12.0 12.0 - 15.0 g/dL   HCT 36.8 36.0 - 46.0 %   MCV 95.8 80.0 - 100.0 fL   MCH 31.3 26.0 - 34.0 pg   MCHC 32.6 30.0 - 36.0 g/dL   RDW 13.4 11.5 - 15.5 %    Platelets 158 150 - 400 K/uL   nRBC 0.0 0.0 - 0.2 %   Neutrophils Relative % 52 %   Neutro Abs 2.3 1.7 - 7.7 K/uL   Lymphocytes Relative 30 %   Lymphs Abs 1.3 0.7 - 4.0 K/uL   Monocytes Relative 14 %   Monocytes Absolute 0.6 0.1 - 1.0 K/uL   Eosinophils Relative 3 %   Eosinophils Absolute 0.1 0.0 - 0.5 K/uL   Basophils Relative 1 %   Basophils Absolute 0.0 0.0 - 0.1 K/uL   Immature Granulocytes 0 %   Abs Immature Granulocytes 0.01 0.00 - 0.07 K/uL    Comment: Performed at [facility], [place] [address], [place], [name] [address]  Urinalysis, Routine w reflex microscopic Urine, Clean Catch     Status: Abnormal   Collection Time: 12/27/20 10:00 PM  Result Value Ref Range   Color, Urine YELLOW YELLOW   APPearance HAZY (A) CLEAR   Specific Gravity, Urine 1.016 1.005 - 1.030   pH 7.0 5.0 - 8.0   Glucose, UA NEGATIVE NEGATIVE mg/dL   Hgb urine dipstick NEGATIVE NEGATIVE   Bilirubin Urine NEGATIVE NEGATIVE   Ketones, ur NEGATIVE NEGATIVE mg/dL   Protein, ur NEGATIVE NEGATIVE mg/dL   Nitrite NEGATIVE NEGATIVE   Leukocytes,Ua NEGATIVE NEGATIVE    Comment: Performed at [place] [address], [place], [place] [address]  Urine rapid drug screen (hosp performed)     Status: None   Collection Time: 12/27/20 10:00 PM  Result Value Ref Range   Opiates NONE DETECTED NONE DETECTED   Cocaine NONE DETECTED NONE DETECTED   Benzodiazepines NONE DETECTED NONE DETECTED   Amphetamines NONE DETECTED NONE DETECTED   Tetrahydrocannabinol NONE DETECTED NONE DETECTED   Barbiturates NONE DETECTED NONE DETECTED  Comment: (NOTE) DRUG SCREEN FOR MEDICAL PURPOSES ONLY.  IF CONFIRMATION IS NEEDED FOR ANY PURPOSE, NOTIFY LAB WITHIN 5 DAYS.  LOWEST DETECTABLE LIMITS FOR URINE DRUG SCREEN Drug Class                     Cutoff (ng/mL) Amphetamine and metabolites    1000 Barbiturate and metabolites    200 Benzodiazepine                 660 Tricyclics and  metabolites     300 Opiates and metabolites        300 Cocaine and metabolites        300 THC                            50 Performed at [facility], [place] [address] [place], [place] [address]   Resp Panel by RT-PCR (Flu A&B, Covid) Nasopharyngeal Swab     Status: None   Collection Time: 12/28/20  6:55 AM   Specimen: Nasopharyngeal Swab; Nasopharyngeal(NP) swabs in vial transport medium  Result Value Ref Range   SARS Coronavirus 2 by RT PCR NEGATIVE NEGATIVE    Comment: (NOTE) SARS-CoV-2 target nucleic acids are NOT DETECTED.  The SARS-CoV-2 RNA is generally detectable in upper respiratory specimens during the acute phase of infection. The lowest concentration of SARS-CoV-2 viral copies this assay can detect is 138 copies/mL. A negative result does not preclude SARS-Cov-2 infection and should not be used as the sole basis for treatment or other patient management decisions. A negative result may occur with  improper specimen collection/handling, submission of specimen other than nasopharyngeal swab, presence of viral mutation(s) within the areas targeted by this assay, and inadequate number of viral copies(<138 copies/mL). A negative result must be combined with clinical observations, patient history, and epidemiological information. The expected result is Negative.  Fact Sheet for Patients:  EntrepreneurPulse.com.au  Fact Sheet for Healthcare Providers:  IncredibleEmployment.be  This test is no t yet approved or cleared by the [place] FDA and  has been authorized for detection and/or diagnosis of SARS-CoV-2 by FDA under an Emergency Use Authorization (EUA). This EUA will remain  in effect (meaning this test can be used) for the duration of the COVID-19 declaration under Section 564(b)(1) of the Act, 21 U.S.C.section 360bbb-3(b)(1), unless the authorization is terminated  or revoked sooner.       Influenza A by  PCR NEGATIVE NEGATIVE   Influenza B by PCR NEGATIVE NEGATIVE    Comment: (NOTE) The Xpert Xpress SARS-CoV-2/FLU/RSV plus assay is intended as an aid in the diagnosis of influenza from Nasopharyngeal swab specimens and should not be used as a sole basis for treatment. Nasal washings and aspirates are unacceptable for Xpert Xpress SARS-CoV-2/FLU/RSV testing.  Fact Sheet for Patients: EntrepreneurPulse.com.au  Fact Sheet for Healthcare Providers: IncredibleEmployment.be  This test is not yet approved or cleared by the [place] FDA and has been authorized for detection and/or diagnosis of SARS-CoV-2 by FDA under an Emergency Use Authorization (EUA). This EUA will remain in effect (meaning this test can be used) for the duration of the COVID-19 declaration under Section 564(b)(1) of the Act, 21 U.S.C. section 360bbb-3(b)(1), unless the authorization is terminated or revoked.  Performed at [facility], [place] [address] [place], [place] [address]     Current Facility-Administered Medications  Medication Dose Route Frequency Provider Last Rate Last Admin   acetaminophen (  TYLENOL) tablet 1,000 mg  1,000 mg Oral Q6H PRN Isla Pence, MD       aspirin chewable tablet 81 mg  81 mg Oral Daily Isla Pence, MD   81 mg at 12/29/20 1017   cholecalciferol (VITAMIN D3) tablet 2,000 Units  2,000 Units Oral Daily Isla Pence, MD   2,000 Units at 12/29/20 1018   docusate sodium (COLACE) capsule 100 mg  100 mg Oral QHS Isla Pence, MD       escitalopram (LEXAPRO) tablet 10 mg  10 mg Oral QPM Isla Pence, MD       vitamin B-12 (CYANOCOBALAMIN) tablet 500 mcg  500 mcg Oral Daily Isla Pence, MD   500 mcg at 72/09/47 0962   And   folic acid (FOLVITE) tablet 0.5 mg  0.5 mg Oral Daily Isla Pence, MD   0.5 mg at 12/29/20 1017   gabapentin (NEURONTIN) capsule 100 mg  100 mg Oral TID Isla Pence, MD   100 mg at 12/29/20  1018   loratadine (CLARITIN) tablet 10 mg  10 mg Oral Daily Isla Pence, MD   10 mg at 12/29/20 1020   melatonin tablet 5 mg  5 mg Oral QHS Isla Pence, MD       multivitamin (PROSIGHT) tablet 1 tablet  1 tablet Oral BID Isla Pence, MD   1 tablet at 12/29/20 1019   polyethylene glycol (MIRALAX / GLYCOLAX) packet 17 g  17 g Oral Daily PRN Isla Pence, MD       senna-docusate (Senokot-S) tablet 2 tablet  2 tablet Oral Daily Isla Pence, MD   2 tablet at 12/29/20 1017   Current Outpatient Medications  Medication Sig Dispense Refill   acetaminophen (TYLENOL) 500 MG tablet Take 1,000 mg by mouth every 6 (six) hours as needed for moderate pain, mild pain, headache or fever.     ASPIRIN 81 PO Take 1 tablet by mouth daily.     Calcium Carbonate (CALCIUM-CARB 600 PO) Take 1 tablet by mouth 2 (two) times daily.     cetirizine (ZYRTEC ALLERGY) 10 MG tablet Take 1 tablet (10 mg total) by mouth daily. 30 tablet 11   Cholecalciferol (VITAMIN D3) 2000 units capsule Take 1 capsule (2,000 Units total) by mouth daily. 100 capsule 3   Cobalamin Combinations (VITAMIN B12-FOLIC ACID PO) Place 1 lozenge under the tongue in the morning.     docusate sodium (COLACE) 100 MG capsule Take 100 mg by mouth at bedtime.     escitalopram (LEXAPRO) 10 MG tablet Take 10 mg by mouth every evening.     gabapentin (NEURONTIN) 100 MG capsule Take 1 capsule (100 mg total) by mouth 3 (three) times daily. 90 capsule 3   KETOCONAZOLE, TOPICAL, (NIZORAL A-D) 1 % SHAM Apply 1 application topically once a week. Wednesday     melatonin 5 MG TABS Take 5 mg by mouth at bedtime.     Multiple Vitamins-Minerals (PRESERVISION AREDS 2 PO) Take 1 capsule by mouth 2 (two) times daily.     Nutritional Supplements (RESOURCE 2.0 PO) Take 180 mLs by mouth 2 (two) times daily.     polyethylene glycol (MIRALAX / GLYCOLAX) 17 g packet Take 17 g by mouth daily as needed for moderate constipation or mild constipation. (Patient taking  differently: Take 17 g by mouth daily.) 14 each 0   SALONPAS 3.02-13-08 % PTCH Apply 1 patch topically See admin instructions. Apply 1 patch to the lower back and remove (at most) after 8 hours- once a  day as needed for pain     senna-docusate (SENOKOT-S) 8.6-50 MG tablet Take 2 tablets by mouth daily.     sodium phosphate (FLEET) 7-19 GM/118ML ENEM Place 1 enema rectally daily as needed for severe constipation or mild constipation.      Musculoskeletal: Strength & Muscle Tone: within normal limits Gait & Station: normal Patient leans: N/A            Psychiatric Specialty Exam:  Presentation  General Appearance: Appropriate for Environment; Casual  Eye Contact:Fair  Speech:Clear and Coherent; Normal Rate  Speech Volume:Decreased  Handedness:Right   Mood and Affect  Mood:Depressed  Affect:Depressed; Flat   Thought Process  Thought Processes:Coherent; Linear  Descriptions of Associations:Intact  Orientation:Full (Time, Place and Person)  Thought Content:Logical  History of Schizophrenia/Schizoaffective disorder:No  Duration of Psychotic Symptoms:N/A  Hallucinations:Hallucinations: None  Ideas of Reference:None  Suicidal Thoughts:Suicidal Thoughts: Yes, Passive SI Passive Intent and/or Plan: With Intent; Without Plan  Homicidal Thoughts:Homicidal Thoughts: No   Sensorium  Memory:Immediate Good; Recent Good; Remote Fair  Judgment:Fair  Insight:Fair   Executive Functions  Concentration:Fair  Attention Span:Fair  [place]   Psychomotor Activity  Psychomotor Activity:Psychomotor Activity: Normal   Assets  Assets:Desire for Improvement; Armed forces logistics/support/administrative officer; Leisure Time; Catering manager; Housing; Physical Health   Sleep  Sleep:Sleep: Fair   Physical Exam: Physical Exam ROS Blood pressure (!) 165/86, pulse 69, temperature 97.6 F (36.4 C), temperature source Oral, resp.  rate 16, height 5\' 4"  (1.626 m), weight 39 kg, SpO2 96 %. Body mass index is 14.76 kg/m.  Treatment Plan Summary: Plan   Continue to recommend geropsych inpatient facility at this time. Patient with symptoms consistent with MDD, severe at this time. SHe continues to endorse some passive suicidal ideations and recommend continuing 1:1 safety observation at this time.  -WIll obtain EKG.  Will start Zoloft 25mg  po qhs for depression. Continue seroquel 25mg  po qhs for adjuvant therapy and intermittent hallucinations in which she denies at this time.   Disposition: Recommend psychiatric Inpatient admission when medically cleared.  Suella Broad, FNP 12/29/2020 11:25 AM

## 2020-12-29 NOTE — ED Notes (Signed)
Pt's dinner has arrived. Pt sat on the side of the stretcher. Pt eating her dinner, will continue to monitor pt

## 2020-12-29 NOTE — ED Provider Notes (Signed)
Emergency Medicine Observation Re-evaluation Note  Tanya Walker is a 80 y.o. female, seen on rounds today.  Pt initially presented to the ED for complaints of Suicide Attempt Currently, the patient is calm.  Pt here awaiting inpatient geri-psych placement for an attempted suicide attempt on 11/20.  Pt said she did not sleep very well last night.  She feels ok this morning.  No si now.  Physical Exam  BP (!) 165/86 (BP Location: Left Arm) Comment: Simultaneous filing. User may not have seen previous data.  Pulse 69 Comment: Simultaneous filing. User may not have seen previous data.  Temp 97.6 F (36.4 C) (Oral)   Resp 16   Ht 5\' 4"  (1.626 m)   Wt 39 kg   SpO2 96% Comment: Simultaneous filing. User may not have seen previous data.  BMI 14.76 kg/m  Physical Exam General: awake and alert Cardiac: rrr Lungs: cta bilat Psych: calm  ED Course / MDM  EKG:   I have reviewed the labs performed to date as well as medications administered while in observation.  Recent changes in the last 24 hours include none.  Plan  Current plan is for awaiting inpatient geri-psych placement.  DORATHEA FAERBER is not under involuntary commitment.     Isla Pence, MD 12/29/20 [phone]

## 2020-12-30 ENCOUNTER — Encounter: Payer: Self-pay | Admitting: Family

## 2020-12-30 ENCOUNTER — Inpatient Hospital Stay
Admission: AD | Admit: 2020-12-30 | Discharge: 2021-01-13 | DRG: 885 | Disposition: A | Discharge status: Nursing Home (Includes ICF) | Payer: Medicare Other | Source: Intra-hospital | Attending: Psychiatry | Admitting: Psychiatry

## 2020-12-30 ENCOUNTER — Other Ambulatory Visit: Payer: Self-pay

## 2020-12-30 DIAGNOSIS — F0392 Unspecified dementia, unspecified severity, with psychotic disturbance: Secondary | ICD-10-CM | POA: Diagnosis present

## 2020-12-30 DIAGNOSIS — Z9151 Personal history of suicidal behavior: Secondary | ICD-10-CM

## 2020-12-30 DIAGNOSIS — R45851 Suicidal ideations: Secondary | ICD-10-CM | POA: Diagnosis present

## 2020-12-30 DIAGNOSIS — F4323 Adjustment disorder with mixed anxiety and depressed mood: Secondary | ICD-10-CM | POA: Diagnosis not present

## 2020-12-30 DIAGNOSIS — R636 Underweight: Secondary | ICD-10-CM | POA: Diagnosis present

## 2020-12-30 DIAGNOSIS — Z79899 Other long term (current) drug therapy: Secondary | ICD-10-CM

## 2020-12-30 DIAGNOSIS — F0393 Unspecified dementia, unspecified severity, with mood disturbance: Secondary | ICD-10-CM | POA: Diagnosis present

## 2020-12-30 DIAGNOSIS — Z7982 Long term (current) use of aspirin: Secondary | ICD-10-CM | POA: Diagnosis not present

## 2020-12-30 DIAGNOSIS — S0990XA Unspecified injury of head, initial encounter: Secondary | ICD-10-CM | POA: Diagnosis not present

## 2020-12-30 DIAGNOSIS — F332 Major depressive disorder, recurrent severe without psychotic features: Secondary | ICD-10-CM | POA: Diagnosis not present

## 2020-12-30 DIAGNOSIS — M810 Age-related osteoporosis without current pathological fracture: Secondary | ICD-10-CM | POA: Diagnosis present

## 2020-12-30 DIAGNOSIS — Z20822 Contact with and (suspected) exposure to covid-19: Secondary | ICD-10-CM | POA: Diagnosis not present

## 2020-12-30 DIAGNOSIS — W1830XA Fall on same level, unspecified, initial encounter: Secondary | ICD-10-CM | POA: Diagnosis present

## 2020-12-30 DIAGNOSIS — Z681 Body mass index (BMI) 19 or less, adult: Secondary | ICD-10-CM | POA: Diagnosis not present

## 2020-12-30 DIAGNOSIS — G4700 Insomnia, unspecified: Secondary | ICD-10-CM | POA: Diagnosis present

## 2020-12-30 DIAGNOSIS — I10 Essential (primary) hypertension: Secondary | ICD-10-CM | POA: Diagnosis not present

## 2020-12-30 DIAGNOSIS — F0394 Unspecified dementia, unspecified severity, with anxiety: Secondary | ICD-10-CM | POA: Diagnosis present

## 2020-12-30 DIAGNOSIS — Z85828 Personal history of other malignant neoplasm of skin: Secondary | ICD-10-CM

## 2020-12-30 DIAGNOSIS — Z8249 Family history of ischemic heart disease and other diseases of the circulatory system: Secondary | ICD-10-CM | POA: Diagnosis not present

## 2020-12-30 DIAGNOSIS — F333 Major depressive disorder, recurrent, severe with psychotic symptoms: Secondary | ICD-10-CM | POA: Diagnosis not present

## 2020-12-30 DIAGNOSIS — Z96641 Presence of right artificial hip joint: Secondary | ICD-10-CM | POA: Diagnosis present

## 2020-12-30 DIAGNOSIS — T1491XA Suicide attempt, initial encounter: Secondary | ICD-10-CM | POA: Diagnosis not present

## 2020-12-30 DIAGNOSIS — M542 Cervicalgia: Secondary | ICD-10-CM | POA: Diagnosis present

## 2020-12-30 DIAGNOSIS — F5000 Anorexia nervosa, unspecified: Secondary | ICD-10-CM | POA: Diagnosis present

## 2020-12-30 DIAGNOSIS — Z9049 Acquired absence of other specified parts of digestive tract: Secondary | ICD-10-CM

## 2020-12-30 MED ORDER — ALUM & MAG HYDROXIDE-SIMETH 200-200-20 MG/5ML PO SUSP: 30.0000 mL | ORAL | Status: DC | PRN

## 2020-12-30 MED ORDER — ENSURE ENLIVE PO LIQD
237.0000 mL | Freq: Two times a day (BID) | ORAL | Status: DC
Start: 2020-12-30 — End: 2021-01-13
  Administered 2020-12-30 – 2021-01-13 (×25): 237 mL via ORAL

## 2020-12-30 MED ORDER — SENNOSIDES-DOCUSATE SODIUM 8.6-50 MG PO TABS
2.0000 | ORAL_TABLET | Freq: Every day | ORAL | Status: DC
  Administered 2020-12-31 – 2021-01-13 (×14): 2 via ORAL
  Filled 2020-12-30 (×14): qty 2

## 2020-12-30 MED ORDER — LORATADINE 10 MG PO TABS
10.0000 mg | ORAL_TABLET | Freq: Every day | ORAL | Status: DC
  Administered 2020-12-31 – 2021-01-13 (×14): 10 mg via ORAL
  Filled 2020-12-30 (×15): qty 1

## 2020-12-30 MED ORDER — ADULT MULTIVITAMIN W/MINERALS CH: 1.0000 | ORAL_TABLET | Freq: Every day | ORAL | Status: DC

## 2020-12-30 MED ORDER — DOCUSATE SODIUM 100 MG PO CAPS
100.0000 mg | ORAL_CAPSULE | Freq: Every day | ORAL | Status: DC
  Administered 2020-12-30 – 2021-01-12 (×14): 100 mg via ORAL
  Filled 2020-12-30 (×14): qty 1

## 2020-12-30 MED ORDER — MELATONIN 5 MG PO TABS: 5.0000 mg | ORAL_TABLET | Freq: Every day | ORAL | Status: DC

## 2020-12-30 MED ORDER — VITAMIN D 25 MCG (1000 UNIT) PO TABS
2000.0000 [IU] | ORAL_TABLET | Freq: Every day | ORAL | Status: DC
  Administered 2020-12-31 – 2021-01-13 (×14): 2000 [IU] via ORAL
  Filled 2020-12-30 (×14): qty 2

## 2020-12-30 MED ORDER — ADULT MULTIVITAMIN W/MINERALS CH
1.0000 | ORAL_TABLET | Freq: Every day | ORAL | Status: DC
  Administered 2020-12-31 – 2021-01-13 (×14): 1 via ORAL
  Filled 2020-12-30 (×14): qty 1

## 2020-12-30 MED ORDER — MELATONIN 5 MG PO TABS
5.0000 mg | ORAL_TABLET | Freq: Every evening | ORAL | Status: DC | PRN
  Administered 2021-01-04 – 2021-01-12 (×7): 5 mg via ORAL
  Filled 2020-12-30 (×8): qty 1

## 2020-12-30 MED ORDER — POLYETHYLENE GLYCOL 3350 17 G PO PACK
17.0000 g | PACK | Freq: Every day | ORAL | Status: DC | PRN
  Administered 2021-01-02 – 2021-01-10 (×2): 17 g via ORAL
  Filled 2020-12-30 (×2): qty 1

## 2020-12-30 MED ORDER — ACETAMINOPHEN 500 MG PO TABS: 1000.0000 mg | ORAL_TABLET | Freq: Four times a day (QID) | ORAL | Status: DC | PRN

## 2020-12-30 MED ORDER — VITAMIN B-12 1000 MCG PO TABS
500.0000 ug | ORAL_TABLET | Freq: Every day | ORAL | Status: DC
  Administered 2020-12-31 – 2021-01-13 (×14): 500 ug via ORAL
  Filled 2020-12-30 (×6): qty 1
  Filled 2020-12-30: qty 0.5
  Filled 2020-12-30 (×7): qty 1

## 2020-12-30 MED ORDER — FOLIC ACID 1 MG PO TABS
0.5000 mg | ORAL_TABLET | Freq: Every day | ORAL | Status: DC
  Administered 2020-12-31 – 2021-01-13 (×14): 0.5 mg via ORAL
  Filled 2020-12-30 (×11): qty 1
  Filled 2020-12-30: qty 0.5
  Filled 2020-12-30 (×2): qty 1

## 2020-12-30 MED ORDER — OLANZAPINE 5 MG PO TABS
5.0000 mg | ORAL_TABLET | Freq: Every day | ORAL | Status: DC
  Administered 2020-12-30 – 2021-01-12 (×14): 5 mg via ORAL
  Filled 2020-12-30 (×13): qty 1

## 2020-12-30 MED ORDER — MAGNESIUM HYDROXIDE 400 MG/5ML PO SUSP
30.0000 mL | Freq: Every day | ORAL | Status: DC | PRN
  Administered 2021-01-02: 30 mL via ORAL
  Filled 2020-12-30: qty 30

## 2020-12-30 MED ORDER — GABAPENTIN 100 MG PO CAPS
100.0000 mg | ORAL_CAPSULE | Freq: Three times a day (TID) | ORAL | Status: DC
  Administered 2020-12-30 – 2021-01-13 (×42): 100 mg via ORAL
  Filled 2020-12-30 (×42): qty 1

## 2020-12-30 MED ORDER — ESCITALOPRAM OXALATE 10 MG PO TABS
10.0000 mg | ORAL_TABLET | Freq: Every evening | ORAL | Status: DC
  Administered 2020-12-30 – 2021-01-04 (×6): 10 mg via ORAL
  Filled 2020-12-30 (×6): qty 1

## 2020-12-30 MED ORDER — ENSURE ENLIVE PO LIQD
237.0000 mL | Freq: Two times a day (BID) | ORAL | Status: DC
Start: 2020-12-30 — End: 2020-12-30
  Filled 2020-12-30: qty 237

## 2020-12-30 MED ORDER — PROSIGHT PO TABS: 1.0000 | ORAL_TABLET | Freq: Two times a day (BID) | ORAL | Status: DC

## 2020-12-30 MED ORDER — ASPIRIN 81 MG PO CHEW
81.0000 mg | CHEWABLE_TABLET | Freq: Every day | ORAL | Status: DC
  Administered 2020-12-31 – 2021-01-13 (×14): 81 mg via ORAL
  Filled 2020-12-30 (×14): qty 1

## 2020-12-30 NOTE — Progress Notes (Signed)
80 year old female admitted to [facility] unit from [facility] with the admitting diagnosis of Major Depressive Disorder (MDD). Patient is alert with confusion. Ambulatory with rolling walker with unsteady gait. Patient is weak and frail, high risk for fall. Continent of bladder and bowel function. Appetite is poor, patient refuse dinner but drink 100% of the ensure. Require assistance with ADL's.   Body assessment done with 2nd RN Manuela Schwartz, patient observed with dry, flaky and intact skin.   Orientation provide to patient on unit rules and phone hours. Orientation also provided to call light.

## 2020-12-30 NOTE — ED Notes (Signed)
Pt in [facility] scrubs w/ sitter present at bedside

## 2020-12-30 NOTE — ED Provider Notes (Signed)
Emergency Medicine Observation Re-evaluation Note  Tanya Walker is a 80 y.o. female, seen on rounds today.  Pt initially presented to the ED for complaints of Suicide Attempt Currently, the patient is awake, sitting up in bed.  Physical Exam  BP 135/67   Pulse 64   Temp 97.7 F (36.5 C) (Oral)   Resp 18   Ht 5\' 4"  (1.626 m)   Wt 39 kg   SpO2 93%   BMI 14.76 kg/m  Physical Exam General: Nondistressed, awake, alert Cardiac: Extremities well perfused Lungs: Breathing is even and unlabored Psych: No agitation, not responding to internal stimuli  ED Course / MDM  EKG:EKG Interpretation  Date/Time:  Tuesday December 29 2020 11:43:22 EST Ventricular Rate:  73 PR Interval:  140 QRS Duration: 98 QT Interval:  443 QTC Calculation: 489 R Axis:   97 Text Interpretation: Sinus rhythm Consider left atrial enlargement Right axis deviation RSR' in V1 or V2, probably normal variant Borderline prolonged QT interval since last tracing no significant change Confirmed by Daleen Bo [phone]) on 12/29/2020 11:49:21 AM  I have reviewed the labs performed to date as well as medications administered while in observation.  Recent changes in the last 24 hours include none.  Plan  Current plan is for geriatric-psychiatry placement.  Tanya Walker is not under involuntary commitment.     Godfrey Pick, MD 12/30/20 [phone]

## 2020-12-30 NOTE — BHH Suicide Risk Assessment (Signed)
[facility] Admission Suicide Risk Assessment   Nursing information obtained from:    Demographic factors:    Current Mental Status:    Loss Factors:    Historical Factors:    Risk Reduction Factors:     Total Time spent with patient: 1 hour Principal Problem: MDD (major depressive disorder), recurrent episode, severe ([place]) Diagnosis:  Principal Problem:   MDD (major depressive disorder), recurrent episode, severe ([place])  Subjective Data: Suicide attempt at assisted living by spinning and falling.  Continued Clinical Symptoms:    The "Alcohol Use Disorders Identification Test", Guidelines for Use in Primary Care, Second Edition.  World Pharmacologist [facility]). Score between 0-7:  no or low risk or alcohol related problems. Score between 8-15:  moderate risk of alcohol related problems. Score between 16-19:  high risk of alcohol related problems. Score 20 or above:  warrants further diagnostic evaluation for alcohol dependence and treatment.   CLINICAL FACTORS:   Severe Anxiety and/or Agitation Depression:   Anhedonia Hopelessness   Musculoskeletal: Strength & Muscle Tone: within normal limits Gait & Station: unsteady Patient leans: N/A  Psychiatric Specialty Exam:  Presentation  General Appearance: Appropriate for Environment; Casual  Eye Contact:Fair  Speech:Clear and Coherent; Normal Rate  Speech Volume:Decreased  Handedness:Right   Mood and Affect  Mood:Depressed  Affect:Depressed; Flat   Thought Process  Thought Processes:Coherent; Linear  Descriptions of Associations:Intact  Orientation:Full (Time, Place and Person)  Thought Content:Logical  History of Schizophrenia/Schizoaffective disorder:No  Duration of Psychotic Symptoms:N/A  Hallucinations:Hallucinations: None  Ideas of Reference:None  Suicidal Thoughts:Suicidal Thoughts: Yes, Passive SI Passive Intent and/or Plan: With Intent; Without Plan  Homicidal Thoughts:Homicidal Thoughts:  No   Sensorium  Memory:Immediate Good; Recent Good; Remote Fair  Judgment:Fair  Insight:Fair   Executive Functions  Concentration:Fair  Attention Span:Fair  [place]   Psychomotor Activity  Psychomotor Activity:Psychomotor Activity: Normal   Assets  Assets:Desire for Improvement; Armed forces logistics/support/administrative officer; Leisure Time; Catering manager; Housing; Physical Health   Sleep  Sleep:Sleep: Fair    Physical Exam: Physical Exam ROS There were no vitals taken for this visit. There is no height or weight on file to calculate BMI.   COGNITIVE FEATURES THAT CONTRIBUTE TO RISK:  None    SUICIDE RISK:   Mild:  Suicidal ideation of limited frequency, intensity, duration, and specificity.  There are no identifiable plans, no associated intent, mild dysphoria and related symptoms, good self-control (both objective and subjective assessment), few other risk factors, and identifiable protective factors, including available and accessible social support.  PLAN OF CARE: See Orders  I certify that inpatient services furnished can reasonably be expected to improve the patient's condition.   [place], DO 12/30/2020, 3:12 PM

## 2020-12-30 NOTE — BH Assessment (Signed)
Patient is to be admitted to [facility] today 12/30/20 by Dr.  Louis Meckel .  Attending Physician will be Dr.  Louis Meckel .   Patient has been assigned to room L28, by [facility] Charge Nurse, Vinnie Level.  Call report to: [phone].   ER staff is aware of the admission:  Seth Bake, Patient Access.

## 2020-12-30 NOTE — H&P (Signed)
Psychiatric Admission Assessment Adult  Patient Identification: Tanya Walker MRN:  [id number] Date of Evaluation:  12/30/2020 Chief Complaint:  MDD (major depressive disorder), recurrent episode, severe ([place]) [F33.2] Principal Diagnosis: MDD (major depressive disorder), recurrent episode, severe ([place]) Diagnosis:  Principal Problem:   MDD (major depressive disorder), recurrent episode, severe ([place])  History of Present Illness:  Patient is a 80 year old white female who presents to [facility] on transfer from [facility] emergency room.  She currently lives in assisted living with her husband.  She has been there for 5 years and states that she does not like it.  She looks like she has lost weight and she states that she has lost weight because she does not like the food.  This is apparently her second suicide attempt.  She does not remember being psychiatrically hospitalized in the past.  She admits to depression and suicide attempt.  She states that currently her medications are up-to-date.  She is able to answer questions appropriately for the most part but has trouble remembering things in her past.  She is able to contract for safety on the inpatient unit.   PER INITIAL INTAKE: Patient presents via EMS after striking her head following a fall.  Initially patient is reluctant to provide full details.  But she eventually acknowledges that she was spitting around trying to fall and hit her head in a suicide attempt.  She acknowledges dealing with depression for some time.  Currently she has pain only in the right lateral lower neck, described as sore, moderate, worse with motion.  She denies headache, weakness, numbness, speech difficulty, dyspnea, chest pain. No recent medication change, use, activity change.  Pt is an 80 year old married female who presents unaccompanied to [facility] ED via EMS reporting depressive symptoms and suicidal ideation. Pt's medical record  indicates she has a history of major depressive disorder and dementia. Pt was unable to answer some questions due to memory impairment. Pt says she was brought to [facility] "because I tried to kill myself." Pt reports she was intentionally spinning around so she would fall on concrete. She cannot identify anything that happened recently that triggered this attempt. Pt says she is still suicidal and wants to die. She denies any history of previous suicide attempts. Pt says she "has been trying to get by" but feels very depressed. Pt acknowledges symptoms including crying spells, social withdrawal, loss of interest in usual pleasures, fatigue, irritability, decreased concentration, decreased sleep, decreased appetite and feelings of worthlessness and hopelessness. Pt says she cannot sleep. She says she has been losing weight and today Pt weighs 86 pounds. She reports recent visual hallucinations which she cannot describe. Pt's medical record indicates on 11/20/2020 she reported hallucinations of seeing little boys in her room and water coming from the ceiling in her room. She denies homicidal ideation or history of aggression. She denies alcohol or other substance use.   Pt lives with her husband, Daesha Insco, at [facility]. She says she has two children. Pt gave permission to speak with her daughter, Rana Snare. TTS attempted to contact Ms Stegall at [phone] and call went to full voicemail.   Associated Signs/Symptoms: Depression Symptoms:  depressed mood, anhedonia, impaired memory, recurrent thoughts of death, suicidal attempt, anxiety, weight loss, Duration of Depression Symptoms: Greater than two weeks  (Hypo) Manic Symptoms:  Labiality of Mood, Anxiety Symptoms:  Excessive Worry, Psychotic Symptoms:   None PTSD Symptoms: NA Total Time spent with patient: 1 hour  Past Psychiatric History: As above  Is the patient at risk to self? Yes.    Has the patient been a risk to self in the  past 6 months? No.  Has the patient been a risk to self within the distant past? No.  Is the patient a risk to others? No.  Has the patient been a risk to others in the past 6 months? No.  Has the patient been a risk to others within the distant past? No.   Prior Inpatient Therapy: Yes Prior Outpatient Therapy: Yes  Alcohol Screening:  N/A Substance Abuse History in the last 12 months:  No. 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   Depression    years ago   Headache    sinus headaches   Lipoma of arm    Right   Medical history non-contributory    Osteoporosis    SVD (spontaneous vaginal delivery)    x 2   Varicose vein    Vitamin D deficiency     Past Surgical History:  Procedure Laterality Date   ANTERIOR APPROACH HEMI HIP ARTHROPLASTY Right 09/04/2020   Procedure: ANTERIOR APPROACH HEMI HIP ARTHROPLASTY;  Surgeon: Rod Can, MD;  Location: [facility];  Service: Orthopedics;  Laterality: Right;   [place]   COLONOSCOPY     GANGLION CYST EXCISION     x2 left and right arm   HYSTEROSCOPY WITH D & C N/A 07/15/2013   Procedure: DILATATION AND CURETTAGE /HYSTEROSCOPY;  Surgeon: Lovenia Kim, MD;  Location: [facility];  Service: Gynecology;  Laterality: N/A;   KYPHOPLASTY Bilateral 05/04/2020   Procedure: KYPHOPLASTY THORACIC TWELVE;  Surgeon: Vallarie Mare, MD;  Location: [facility];  Service: Neurosurgery;  Laterality: Bilateral;   MANDIBLE SURGERY  1954   WISDOM TOOTH EXTRACTION     Family History:  Family History  Problem Relation Age of Onset   Hypertension Mother    Heart disease Father    Cancer Father    Family Psychiatric  History: Unremarkable Tobacco Screening:   Social History:  Social History   Substance and Sexual Activity   Alcohol Use No     Social History   Substance and Sexual Activity  Drug Use No    Additional Social History:                           Allergies:   Allergies  Allergen Reactions   Alendronate Sodium Other (See Comments)    Leg cramps and "Allergic," per MAR   Risedronate Sodium Other (See Comments)    Caused the patient to be achy and is "Allergic," per MAR   Tramadol Other (See Comments)    Caused the patient to feel badly    Keflex [Cephalexin] Other (See Comments)    Possible rash   Lab Results: No results found for this or any previous visit (from the past 48 hour(s)).  Blood Alcohol level:  Lab Results  Component Value Date   ETH <10 64/15/8309    Metabolic Disorder Labs:  No results found for: HGBA1C, MPG No results found for: PROLACTIN Lab Results  Component Value Date   CHOL 201 (H) 11/01/2019   TRIG 52 11/01/2019  HDL 69 11/01/2019   CHOLHDL 2.9 11/01/2019   VLDL 11.2 12/05/2016   LDLCALC 118 (H) 11/01/2019   LDLCALC 107 (H) 12/05/2016    Current Medications: Current Facility-Administered Medications  Medication Dose Route Frequency Provider Last Rate Last Admin   acetaminophen (TYLENOL) tablet 1,000 mg  1,000 mg Oral Q6H PRN Starkes-Perry, Gayland Curry, FNP       alum & mag hydroxide-simeth (MAALOX/MYLANTA) 200-200-20 MG/5ML suspension 30 mL  30 mL Oral Q4H PRN Starkes-Perry, Gayland Curry, FNP       [START ON 12/31/2020] aspirin chewable tablet 81 mg  81 mg Oral Daily Starkes-Perry, Gayland Curry, FNP       [START ON 12/31/2020] cholecalciferol (VITAMIN D3) tablet 2,000 Units  2,000 Units Oral Daily Starkes-Perry, Gayland Curry, FNP       docusate sodium (COLACE) capsule 100 mg  100 mg Oral QHS Starkes-Perry, Gayland Curry, FNP       escitalopram (LEXAPRO) tablet 10 mg  10 mg Oral QPM Starkes-Perry, Gayland Curry, FNP       feeding supplement (ENSURE ENLIVE / ENSURE PLUS) liquid 237 mL  237 mL Oral BID BM Parks Ranger, DO       [START ON 12/31/2020] vitamin B-12  (CYANOCOBALAMIN) tablet 500 mcg  500 mcg Oral Daily Starkes-Perry, Gayland Curry, FNP       And   [START ON 82/42/3536] folic acid (FOLVITE) tablet 0.5 mg  0.5 mg Oral Daily Starkes-Perry, Gayland Curry, FNP       gabapentin (NEURONTIN) capsule 100 mg  100 mg Oral TID Suella Broad, FNP       [START ON 12/31/2020] loratadine (CLARITIN) tablet 10 mg  10 mg Oral Daily Starkes-Perry, Gayland Curry, FNP       magnesium hydroxide (MILK OF MAGNESIA) suspension 30 mL  30 mL Oral Daily PRN Suella Broad, FNP       melatonin tablet 5 mg  5 mg Oral QHS PRN Parks Ranger, DO       [START ON 12/31/2020] multivitamin with minerals tablet 1 tablet  1 tablet Oral Daily Parks Ranger, DO       OLANZapine (ZYPREXA) tablet 5 mg  5 mg Oral QHS Shilee Biggs Edward, DO       polyethylene glycol (MIRALAX / GLYCOLAX) packet 17 g  17 g Oral Daily PRN Starkes-Perry, Gayland Curry, FNP       [START ON 12/31/2020] senna-docusate (Senokot-S) tablet 2 tablet  2 tablet Oral Daily Starkes-Perry, Gayland Curry, FNP       PTA Medications: Medications Prior to Admission  Medication Sig Dispense Refill Last Dose   acetaminophen (TYLENOL) 500 MG tablet Take 1,000 mg by mouth every 6 (six) hours as needed for moderate pain, mild pain, headache or fever.      ASPIRIN 81 PO Take 1 tablet by mouth daily.      Calcium Carbonate (CALCIUM-CARB 600 PO) Take 1 tablet by mouth 2 (two) times daily.      cetirizine (ZYRTEC ALLERGY) 10 MG tablet Take 1 tablet (10 mg total) by mouth daily. 30 tablet 11    Cholecalciferol (VITAMIN D3) 2000 units capsule Take 1 capsule (2,000 Units total) by mouth daily. 100 capsule 3    Cobalamin Combinations (VITAMIN B12-FOLIC ACID PO) Place 1 lozenge under the tongue in the morning.      docusate sodium (COLACE) 100 MG capsule Take 100 mg by mouth at bedtime.      escitalopram (LEXAPRO) 10 MG tablet Take 10  mg by mouth every evening.      gabapentin (NEURONTIN) 100 MG capsule Take 1 capsule (100  mg total) by mouth 3 (three) times daily. 90 capsule 3    KETOCONAZOLE, TOPICAL, (NIZORAL A-D) 1 % SHAM Apply 1 application topically once a week. Wednesday      melatonin 5 MG TABS Take 5 mg by mouth at bedtime.      Multiple Vitamins-Minerals (PRESERVISION AREDS 2 PO) Take 1 capsule by mouth 2 (two) times daily.      Nutritional Supplements (RESOURCE 2.0 PO) Take 180 mLs by mouth 2 (two) times daily.      polyethylene glycol (MIRALAX / GLYCOLAX) 17 g packet Take 17 g by mouth daily as needed for moderate constipation or mild constipation. (Patient taking differently: Take 17 g by mouth daily.) 14 each 0    SALONPAS 3.02-13-08 % PTCH Apply 1 patch topically See admin instructions. Apply 1 patch to the lower back and remove (at most) after 8 hours- once a day as needed for pain      senna-docusate (SENOKOT-S) 8.6-50 MG tablet Take 2 tablets by mouth daily.      sodium phosphate (FLEET) 7-19 GM/118ML ENEM Place 1 enema rectally daily as needed for severe constipation or mild constipation.       Musculoskeletal: Strength & Muscle Tone: decreased Gait & Station: unsteady Patient leans: N/A            Psychiatric Specialty Exam:  Presentation  General Appearance: Appropriate for Environment; Casual  Eye Contact:Fair  Speech:Clear and Coherent; Normal Rate  Speech Volume:Decreased  Handedness:Right   Mood and Affect  Mood:Depressed  Affect:Depressed; Flat   Thought Process  Thought Processes:Coherent; Linear  Duration of Psychotic Symptoms: N/A  Past Diagnosis of Schizophrenia or Psychoactive disorder: No  Descriptions of Associations:Intact  Orientation:Full (Time, Place and Person)  Thought Content:Logical  Hallucinations:Hallucinations: None  Ideas of Reference:None  Suicidal Thoughts:Suicidal Thoughts: Yes, Passive SI Passive Intent and/or Plan: With Intent; Without Plan  Homicidal Thoughts:Homicidal Thoughts: No   Sensorium  Memory:Immediate Good;  Recent Good; Remote Fair  Judgment:Fair  Insight:Fair   Executive Functions  Concentration:Fair  Attention Span:Fair  [place]   Psychomotor Activity  Psychomotor Activity:Psychomotor Activity: Normal   Assets  Assets:Desire for Improvement; Armed forces logistics/support/administrative officer; Leisure Time; Catering manager; Housing; Physical Health   Sleep  Sleep:Sleep: Fair    Physical Exam: Physical Exam Constitutional:      Appearance: Normal appearance.  HENT:     Head: Normocephalic and atraumatic.     Mouth/Throat:     Pharynx: Oropharynx is clear.  Eyes:     Extraocular Movements: Extraocular movements intact.     Pupils: Pupils are equal, round, and reactive to light.  Cardiovascular:     Rate and Rhythm: Normal rate and regular rhythm.  Pulmonary:     Effort: Pulmonary effort is normal.     Breath sounds: Normal breath sounds.  Abdominal:     General: Abdomen is flat.     Palpations: Abdomen is soft.  Musculoskeletal:        General: Normal range of motion.     Cervical back: Normal range of motion and neck supple.  Skin:    General: Skin is warm and dry.  Neurological:     General: No focal deficit present.     Mental Status: She is alert and oriented to person, place, and time. Mental status is at baseline.     Coordination:  Coordination abnormal.  Psychiatric:        Mood and Affect: Mood is anxious. Affect is flat.        Speech: Speech normal.        Behavior: Behavior is cooperative.        Thought Content: Thought content normal.        Cognition and Memory: Cognition normal. She exhibits impaired remote memory.        Judgment: Judgment normal.   Review of Systems  Constitutional:  Positive for weight loss.  HENT: Negative.    Eyes: Negative.   Respiratory: Negative.    Cardiovascular: Negative.   Gastrointestinal: Negative.   Genitourinary: Negative.   Musculoskeletal: Negative.   Skin: Negative.    Neurological:  Positive for weakness.  Endo/Heme/Allergies: Negative.   Psychiatric/Behavioral:  Positive for depression, memory loss and suicidal ideas. The patient is nervous/anxious.   There were no vitals taken for this visit. There is no height or weight on file to calculate BMI.  Treatment Plan Summary: Daily contact with patient to assess and evaluate symptoms and progress in treatment, Medication management, and Plan :Start Zyprexa 5 mg at bedtime.  Continue Neurontin and Lexapro 10 mg at bedtime.  Change melatonin to 5 mg at bedtime as needed.  Observation Level/Precautions:  Fall  Laboratory:  CBC Chemistry Profile HbAIC  Psychotherapy:    Medications:    Consultations:    Discharge Concerns:    Estimated LOS:  Other:     Physician Treatment Plan for Primary Diagnosis: MDD (major depressive disorder), recurrent episode, severe ([place]) Long Term Goal(s): Improvement in symptoms so as ready for discharge  Short Term Goals: Ability to identify changes in lifestyle to reduce recurrence of condition will improve, Ability to disclose and discuss suicidal ideas, Ability to identify and develop effective coping behaviors will improve, Compliance with prescribed medications will improve, and Ability to identify triggers associated with substance abuse/mental health issues will improve  Physician Treatment Plan for Secondary Diagnosis: Principal Problem:   MDD (major depressive disorder), recurrent episode, severe ([place])  Long Term Goal(s): Improvement in symptoms so as ready for discharge  Short Term Goals: Ability to identify changes in lifestyle to reduce recurrence of condition will improve, Ability to verbalize feelings will improve, Ability to disclose and discuss suicidal ideas, Ability to demonstrate self-control will improve, Ability to identify and develop effective coping behaviors will improve, Ability to maintain clinical measurements within normal limits will improve,  Compliance with prescribed medications will improve, and Ability to identify triggers associated with substance abuse/mental health issues will improve  I certify that inpatient services furnished can reasonably be expected to improve the patient's condition.    Parks Ranger, DO 11/23/20222:38 PM

## 2020-12-30 NOTE — Tx Team (Signed)
Initial Treatment Plan 12/30/2020 5:08 PM Tanya Walker HCS:[id number]    PATIENT STRESSORS: Other:       PATIENT STRENGTHS: Supportive family/friends    PATIENT IDENTIFIED PROBLEMS: Patient stated "I wanted to take my life " when ask the reason why " I don't know"                     DISCHARGE CRITERIA:  Improved stabilization in mood, thinking, and/or behavior  PRELIMINARY DISCHARGE PLAN: Return to previous living arrangement  PATIENT/FAMILY INVOLVEMENT: This treatment plan has been presented to and reviewed with the patient, Tanya Walker,  The patient have been given the opportunity to ask questions and make suggestions.  Kennadi Albany, RN 12/30/2020, 5:08 PM

## 2020-12-30 NOTE — Plan of Care (Signed)
  Problem: Education: Goal: Knowledge of  General Education information/materials will improve Outcome: Not Progressing Goal: Emotional status will improve Outcome: Not Progressing Goal: Mental status will improve Outcome: Not Progressing   Problem: Activity: Goal: Interest or engagement in activities will improve Outcome: Not Progressing Goal: Sleeping patterns will improve Outcome: Not Progressing   Problem: Health Behavior/Discharge Planning: Goal: Compliance with treatment plan for underlying cause of condition will improve Outcome: Not Progressing   Problem: Safety: Goal: Periods of time without injury will increase Outcome: Not Progressing

## 2020-12-30 NOTE — BH Assessment (Addendum)
[facility] Assessment Progress Note   Per Sheran Fava, NP, this pt requires psychiatric hospitalization.  [place], RN reports that pt has been accepted to [facility] by Caren Griffins, MD to Rm L28.  Pt has reluctantly signed Voluntary Admission and Consent for Treatment, as well as Consent to Release Information to her husband, and signed forms have been faxed to [phone].  EDP Godfrey Pick, MD and pt's nurse, Clarise Cruz, have been notified, and Clarise Cruz agrees to call report to [phone].  Pt is to be transported via [facility].     Jalene Mullet, [place] [phone]   Addendum:  At 11:40 I called pt's husband, Azyiah Bo, and notified him of pt's disposition, including contact information for the unit at [place], [place] Coordinator [phone]

## 2020-12-30 NOTE — Progress Notes (Signed)
Initial Nutrition Assessment  DOCUMENTATION CODES:   Underweight  INTERVENTION:   Ensure Enlive po BID, each supplement provides 350 kcal and 20 grams of protein  MVI po daily   NUTRITION DIAGNOSIS:   Predicted suboptimal nutrient intake related to social / environmental circumstances as evidenced by other (comment) (pt is underweight).  GOAL:   Patient will meet greater than or equal to 90% of their needs  MONITOR:   PO intake, Supplement acceptance  REASON FOR ASSESSMENT:   Other (Comment) (Low BMI)    ASSESSMENT:   80 y.o. female with h/o MDD, PVD and dementia who is admitted with with worsening depressive symptoms and suicide attempt.  Pt with decreased appetite and oral intake in hospital; pt eating <50 of meals. RD suspects pt with poor oral intake at baseline as pt is underweight. RD will add supplements and vitamins to help pt meet her estimated needs. Pt is likely at refeed risk. Per chart, pt appears weight stable pta. Pt is at high risk for malnutrition.   Medications reviewed and include: aspirin, D3, colace, R17, folic acid, melatonin, MVI, senokot  Labs reviewed:  Diet Order:   Diet Order     None      EDUCATION NEEDS:   No education needs have been identified at this time  Last BM:  11/21- TYPE 1  Height:   Ht Readings from Last 1 Encounters:  12/27/20 5\' 4"  (1.626 m)    Weight:   Wt Readings from Last 1 Encounters:  12/27/20 39 kg   Estimated Nutritional Needs:   Kcal:  1200-1400kcal/day  Protein:  60-70g/day  Fluid:  1.0-1.2L/day  Koleen Distance MS, RD, LDN Please refer to [facility] for RD and/or RD on-call/weekend/after hours pager

## 2020-12-30 NOTE — Progress Notes (Signed)
Initial Nutrition Assessment  DOCUMENTATION CODES:   Underweight  INTERVENTION:   Ensure Enlive po BID, each supplement provides 350 kcal and 20 grams of protein  MVI po daily   NUTRITION DIAGNOSIS:   Predicted suboptimal nutrient intake related to social / environmental circumstances as evidenced by other (comment) (pt is underweight).  GOAL:   Patient will meet greater than or equal to 90% of their needs  MONITOR:   PO intake, Supplement acceptance  REASON FOR ASSESSMENT:   Other (Comment) (Low BMI)    ASSESSMENT:   80 y.o. female with h/o MDD, PVD and dementia who is admitted with with worsening depressive symptoms and suicide attempt.  Pt with decreased appetite and oral intake in hospital; pt eating <50 of meals. RD suspects pt with poor oral intake at baseline as pt is underweight. RD will add supplements and vitamins to help pt meet her estimated needs. Pt is likely at refeed risk. Per chart, pt appears weight stable pta. Pt is at high risk for malnutrition.   Medications reviewed and include: aspirin, D3, colace, J88, folic acid, melatonin, MVI, senokot  Labs reviewed:  Diet Order:   Diet Order             Diet regular Room service appropriate? Yes; Fluid consistency: Thin  Diet effective now                  EDUCATION NEEDS:   No education needs have been identified at this time  Last BM:  11/21- type 1  Height:   Ht Readings from Last 1 Encounters:  12/27/20 5\' 4"  (1.626 m)    Weight:   Wt Readings from Last 1 Encounters:  12/27/20 39 kg   BMI:  Body mass index is 14.76 kg/m.  Estimated Nutritional Needs:   Kcal:  1200-1400kcal/day  Protein:  60-70g/day  Fluid:  1.0-1.2L/day  Koleen Distance MS, RD, LDN Please refer to [facility] for RD and/or RD on-call/weekend/after hours pager

## 2020-12-30 NOTE — Group Note (Signed)
[facility] LCSW Group Therapy Note   Group Date: 12/30/2020 Start Time: 1400 End Time: 1430   Type of Therapy/Topic:  Group Therapy:  Emotion Regulation  Participation Level:  Did Not Attend     Description of Group:    The purpose of this group is to assist patients in learning to regulate negative emotions and experience positive emotions. Patients will be guided to discuss ways in which they have been vulnerable to their negative emotions. These vulnerabilities will be juxtaposed with experiences of positive emotions or situations, and patients challenged to use positive emotions to combat negative ones. Special emphasis will be placed on coping with negative emotions in conflict situations, and patients will process healthy conflict resolution skills.  Therapeutic Goals: Patient will identify two positive emotions or experiences to reflect on in order to balance out negative emotions:  Patient will label two or more emotions that they find the most difficult to experience:  Patient will be able to demonstrate positive conflict resolution skills through discussion or role plays:   Summary of Patient Progress:   X    Therapeutic Modalities:   Cognitive Behavioral Therapy Feelings Identification Dialectical Behavioral Therapy   Jasey Cortez A [place], LCSWA

## 2020-12-31 NOTE — Progress Notes (Signed)
[facility] MD Progress Note  12/31/2020 11:26 AM Tanya Walker  MRN:  [id number] Subjective: Tanya Walker is slowly improving.  She is not having as many outbursts.  She has been compliant with her medication.  She is eating and drinking.  Her cooperation is improving.  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   Depression    years ago   Headache    sinus headaches   Lipoma of arm    Right   Medical history non-contributory    Osteoporosis    SVD (spontaneous vaginal delivery)    x 2   Varicose vein    Vitamin D deficiency     Past Surgical History:  Procedure Laterality Date   ANTERIOR APPROACH HEMI HIP ARTHROPLASTY Right 09/04/2020   Procedure: ANTERIOR APPROACH HEMI HIP ARTHROPLASTY;  Surgeon: Rod Can, MD;  Location: [facility];  Service: Orthopedics;  Laterality: Right;   [employer]   COLONOSCOPY     GANGLION CYST EXCISION     x2 left and right arm   HYSTEROSCOPY WITH D & C N/A 07/15/2013   Procedure: DILATATION AND CURETTAGE /HYSTEROSCOPY;  Surgeon: Lovenia Kim, MD;  Location: [facility];  Service: Gynecology;  Laterality: N/A;   KYPHOPLASTY Bilateral 05/04/2020   Procedure: KYPHOPLASTY THORACIC TWELVE;  Surgeon: Vallarie Mare, MD;  Location: [employer];  Service: Neurosurgery;  Laterality: Bilateral;   MANDIBLE SURGERY  1954   WISDOM TOOTH EXTRACTION     Family History:  Family History  Problem Relation Age of Onset   Hypertension Mother    Heart disease Father    Cancer Father    Family Psychiatric  History: Unknown markable Social History:  Social History   Substance and Sexual Activity   Alcohol Use No     Social History   Substance and Sexual Activity  Drug Use No    Social History   Socioeconomic History   Marital status: Married    Spouse name: Not on file   Number of children: 1   Years of education: Not on file   Highest education level: Not on file  Occupational History   Occupation: retired  Tobacco Use   Smoking status: Never   Smokeless tobacco: Never  Vaping Use   Vaping Use: Never used  Substance and Sexual Activity   Alcohol use: No   Drug use: No   Sexual activity: Yes    Birth control/protection: Post-menopausal  Other Topics Concern   Not on file  Social History Narrative   GI in [place] for colonoscopy   Dr Ronita Hipps - GYN      Denies Surgical history      Family history of varicose veins   Mother is 45      Regular exercise - NO   Social Determinants of Health   Financial Resource Strain: Not on file  Food Insecurity: Not on file  Transportation Needs: Not on file  Physical Activity: Not on file  Stress: Not on file  Social Connections: Not on file   Additional Social History:  Sleep: Good  Appetite:  Good  Current Medications: Current Facility-Administered Medications  Medication Dose Route Frequency Provider Last Rate Last Admin   acetaminophen (TYLENOL) tablet 1,000 mg  1,000 mg Oral Q6H PRN Starkes-Perry, Gayland Curry, FNP       alum & mag hydroxide-simeth (MAALOX/MYLANTA) 200-200-20 MG/5ML suspension 30 mL  30 mL Oral Q4H PRN Starkes-Perry, Gayland Curry, FNP       aspirin chewable tablet 81 mg  81 mg Oral Daily Starkes-Perry, Takia S, FNP       cholecalciferol (VITAMIN D3) tablet 2,000 Units  2,000 Units Oral Daily Starkes-Perry, Gayland Curry, FNP       docusate sodium (COLACE) capsule 100 mg  100 mg Oral QHS Suella Broad, FNP   100 mg at 12/30/20 2104   escitalopram (LEXAPRO) tablet 10 mg  10 mg Oral QPM Suella Broad, FNP   10 mg at 12/30/20 1806   feeding supplement (ENSURE ENLIVE  / ENSURE PLUS) liquid 237 mL  237 mL Oral BID BM Parks Ranger, DO   237 mL at 12/30/20 1608   vitamin B-12 (CYANOCOBALAMIN) tablet 500 mcg  500 mcg Oral Daily Suella Broad, FNP       And   folic acid (FOLVITE) tablet 0.5 mg  0.5 mg Oral Daily Starkes-Perry, Takia S, FNP       gabapentin (NEURONTIN) capsule 100 mg  100 mg Oral TID Suella Broad, FNP   100 mg at 12/30/20 2104   loratadine (CLARITIN) tablet 10 mg  10 mg Oral Daily Starkes-Perry, Gayland Curry, FNP       magnesium hydroxide (MILK OF MAGNESIA) suspension 30 mL  30 mL Oral Daily PRN Suella Broad, FNP       melatonin tablet 5 mg  5 mg Oral QHS PRN Parks Ranger, DO       multivitamin with minerals tablet 1 tablet  1 tablet Oral Daily Parks Ranger, DO       OLANZapine [facility]) tablet 5 mg  5 mg Oral QHS Parks Ranger, DO   5 mg at 12/30/20 2104   polyethylene glycol (MIRALAX / GLYCOLAX) packet 17 g  17 g Oral Daily PRN Starkes-Perry, Gayland Curry, FNP       senna-docusate (Senokot-S) tablet 2 tablet  2 tablet Oral Daily Starkes-Perry, Gayland Curry, FNP        Lab Results: No results found for this or any previous visit (from the past 48 hour(s)).  Blood Alcohol level:  Lab Results  Component Value Date   ETH <10 60/73/7106    Metabolic Disorder Labs: No results found for: HGBA1C, MPG No results found for: PROLACTIN Lab Results  Component Value Date   CHOL 201 (H) 11/01/2019   TRIG 52 11/01/2019   HDL 69 11/01/2019   CHOLHDL 2.9 11/01/2019   VLDL 11.2 12/05/2016   LDLCALC 118 (H) 11/01/2019   LDLCALC 107 (H) 12/05/2016    Physical Findings: AIMS:  , ,  ,  ,    CIWA:    COWS:     Musculoskeletal: Strength & Muscle Tone: within normal limits Gait & Station: normal Patient leans: N/A  Psychiatric Specialty Exam:  Presentation  General Appearance: Appropriate for Environment; Casual  Eye Contact:Fair  Speech:Clear and Coherent; Normal Rate  Speech  Volume:Decreased  Handedness:Right   Mood and Affect  Mood:Depressed  Affect:Depressed; Flat   Thought Process  Thought Processes:Coherent; Linear  Descriptions of Associations:Intact  Orientation:Full (Time, Place and Person)  Thought Content:Logical  History of Schizophrenia/Schizoaffective disorder:No  Duration of Psychotic Symptoms:N/A  Hallucinations:No data recorded Ideas of Reference:None  Suicidal Thoughts:No data recorded Homicidal Thoughts:No data recorded  Sensorium  Memory:Immediate Good; Recent Good; Remote Fair  Judgment:Fair  Insight:Fair   Executive Functions  Concentration:Fair  Attention Span:Fair  [place]   Psychomotor Activity  Psychomotor Activity:No data recorded  Assets  Assets:Desire for Improvement; Communication Skills; Leisure Time; Catering manager; Housing; Physical Health   Sleep  Sleep:No data recorded   Physical Exam: Physical Exam Vitals and nursing note reviewed.  Constitutional:      Appearance: She is underweight.  Neurological:     General: No focal deficit present.     Mental Status: She is alert and oriented to person, place, and time.  Psychiatric:        Attention and Perception: Attention and perception normal.        Mood and Affect: Affect is labile and angry.        Speech: Speech normal.        Behavior: Behavior is agitated. Behavior is cooperative.        Thought Content: Thought content is paranoid.        Cognition and Memory: Cognition is impaired. She exhibits impaired recent memory.        Judgment: Judgment is impulsive.   Review of Systems  Constitutional: Negative.   Neurological: Negative.   Psychiatric/Behavioral:  Positive for depression and memory loss. The patient is nervous/anxious.   Blood pressure 112/75, pulse 92, temperature 98.4 F (36.9 C), temperature source Oral, resp. rate 20, height 5\' 2"  (1.575 m), weight  39.5 kg, SpO2 100 %. Body mass index is 15.91 kg/m.   Treatment Plan Summary: Daily contact with patient to assess and evaluate symptoms and progress in treatment, Medication management, and Plan continue current medications.  Parks Ranger, DO 12/31/2020, 11:26 AM

## 2020-12-31 NOTE — BHH Counselor (Signed)
CSW made first attempt to complete clinical assessment with pt. Pt was unable to be roused from sleep. CSW will attempt assessment at another time.   [place] Ganesh [place], MSW, LCSW-A 11/24/202210:07 AM

## 2020-12-31 NOTE — Progress Notes (Addendum)
Pt has been alert and oriented to person, place, time and situation, states, "It's 2022." Pt is easily irritable, affect is blunted, makes poor eye contact. Pt uses her own walker, shuffles during ambulation. Pt reports that she remembers the reason she is in the hospital as, "because I wanted to kill myself." Pt reports that she continues to have suicidal ideation, without plan or intent. When asked what triggered SI thoughts, pt is unable to identify any particular trigger for wanting to kill herself, and reports she is not sure why she feels depressed. Pt states at one point in an irritable tone of voice, "I wish everyone would stop talking to me. Why does everyone keep ask me the same questions! I don't want to talk to you anymore." Pt denies any thoughts of wanting to harm others, denies AVH, denies anxiety. Pt reports she feels tired and went back to sleep after eating breakfast. PT's appetite is fair. Pt denies pain. Will continue to monitor pt per Q15 minute face checks and monitor for safety and progress.   Pt was sleeping after breakfast, when this pt was woken to pass 1000am meds, pt became very irritable and refused them, told writer to come back later after she got some more sleep. Re-attempted to pass meds at 11:30am, pt was agreeable, but with a great deal of encouragement. When pt got to her last pill to take, which was given on spoons of applesauce, pt states, "Not another one!" Pt eventually agreed to take her last pill, then pt was noted to angrily in a hostile way, push the spoon down after medication was taken. Pt ate one bite of her mash potatoes at lunch, a couple of green beans, and one bite of her [place] stuffing. Pt ate 90% of her apple pie, and is drinking PO fluids. Pt wanted to go to sleep again after lunch. Pt was assisted back to her room after pt was noted to be lost wandering down the wrong hall looking for her room. Pt refused to answer basic questions about her appetite on her  way to her room, and was noted to turn her face away from writer, easily irritable, and rolled her eyes with an angry expression. Pt was able to get into her bed without assistance and then asked writer to pull the covers over her and shut her light off. When writer attempted to reposition her walker near her bed so it would be easier to access when she got up, pt yelled at [place], "Don't touch my walker, leave it where it is!." Will continue to monitor pt per Q15 minute face checks and monitor for safety and progress.

## 2020-12-31 NOTE — Group Note (Signed)
[facility] LCSW Group Therapy Note   Group Date: 12/31/2020 Start Time: 1610 End Time: 1140   Type of Therapy/Topic:  Group Therapy:  Balance in Life  Participation Level:  Did Not Attend   Description of Group:    This group will address the concept of balance and how it feels and looks when one is unbalanced. Patients will be encouraged to process areas in their lives that are out of balance, and identify reasons for remaining unbalanced. Facilitators will guide patients utilizing problem- solving interventions to address and correct the stressor making their life unbalanced. Understanding and applying boundaries will be explored and addressed for obtaining  and maintaining a balanced life. Patients will be encouraged to explore ways to assertively make their unbalanced needs known to significant others in their lives, using other group members and facilitator for support and feedback.  Therapeutic Goals: Patient will identify two or more emotions or situations they have that consume much of in their lives. Patient will identify signs/triggers that life has become out of balance:  Patient will identify two ways to set boundaries in order to achieve balance in their lives:  Patient will demonstrate ability to communicate their needs through discussion and/or role plays  Summary of Patient Progress:  X    Therapeutic Modalities:   Cognitive Behavioral Therapy Solution-Focused Therapy Assertiveness Training   [place], LCSWA

## 2020-12-31 NOTE — Progress Notes (Signed)
Patient currently denies SI/HI/AVH. Patient isolative to room. Patient came out to dayroom to eat some of her dinner. Patient compliant with medication administration.  Q15 minute safety checks maintained. Patient remains safe on the unit at this time.

## 2021-01-01 DIAGNOSIS — F332 Major depressive disorder, recurrent severe without psychotic features: Principal | ICD-10-CM

## 2021-01-01 NOTE — Progress Notes (Signed)
Patient currently denies SI/HI/AVH. Patient isolative to room. Patient compliant with medication administration.  Q15 minute safety checks maintained. Patient remains safe on the unit at this time.

## 2021-01-01 NOTE — BHH Counselor (Signed)
Adult Comprehensive Assessment  Patient ID: Tanya Walker, female   DOB: 09-11-1940, 80 y.o.   MRN: [id number]  Information Source: Information source: Patient (Partial assessment completed via chart review of pt)  Current Stressors:  Patient states their primary concerns and needs for treatment are:: "felt like I wanted to die" Patient states their goals for this hospitilization and ongoing recovery are:: "don't know" Educational / Learning stressors: Pt denies Employment / Job issues: Pt denies Family Relationships: Pt denies Museum/gallery curator / Lack of resources (include bankruptcy): "yes, paying the bills" Housing / Lack of housing: Pt states she does not liked living at [facility] (include injuries & life threatening diseases): Pt states she has significant physical health challenges, notably memory decline Social relationships: Pt denies Substance abuse: Pt denies Bereavement / Loss: Pt denies  Living/Environment/Situation:  Living Arrangements: Other (Comment) ("[facility]/assisted living community") Who else lives in the home?: Pt states that she lives with her husband How long has patient lived in current situation?: 5 years What is atmosphere in current home: Other (Comment) ("i don't know")  Family History:  Marital status: Married Number of Years Married: 81 Additional relationship information: Per chart review, pt has good relationship with husband Are you sexually active?:  (unable to assess) What is your sexual orientation?: unable to assess Has your sexual activity been affected by drugs, alcohol, medication, or emotional stress?: unable to assess Does patient have children?: Yes (1 son, 1 daughter) How many children?: 2 How is patient's relationship with their children?: "good"  Childhood History:  By whom was/is the patient raised?: Mother, Father Description of patient's relationship with caregiver when they were a child: "like any other  childhood" Patient's description of current relationship with people who raised him/her: Deceased How were you disciplined when you got in trouble as a child/adolescent?: unable to assess Does patient have siblings?: Yes Number of Siblings: 2 (1 brother, 1 sister, [place]) Description of patient's current relationship with siblings: "fine" Did patient suffer any verbal/emotional/physical/sexual abuse as a child?: No Did patient suffer from severe childhood neglect?: No Has patient ever been sexually abused/assaulted/raped as an adolescent or adult?: No Was the patient ever a victim of a crime or a disaster?: No Witnessed domestic violence?: No Has patient been affected by domestic violence as an adult?: No  Education:  Highest grade of school patient has completed: unable to assess Currently a student?: No Learning disability?: No  Employment/Work Situation:   Employment Situation: Retired Social research officer, government has Been Impacted by Current Illness: No What is the Longest Time Patient has Held a Job?: Unable to [employer] Where was the Patient Employed at that Time?: Unable to assess Has Patient ever Been in the [employer]?: No  Financial Resources:   Museum/gallery curator resources: Teacher, early years/pre Does patient have a Programmer, applications or guardian?: No  Alcohol/Substance Abuse:   What has been your use of drugs/alcohol within the last 12 months?: Pt denies If attempted suicide, did drugs/alcohol play a role in this?: No Has alcohol/substance abuse ever caused legal problems?: No  Social Support System:   Pensions consultant Support System: Good Describe Community Support System: Husband, children Type of faith/religion: Per chart review, Christian How does patient's faith help to cope with current illness?: Unable to assess  Leisure/Recreation:   Do You Have Hobbies?: No (Per chart review)  Strengths/Needs:   What is the patient's perception of their strengths?: "don't like much about myself  anymore" Patient states they can use these personal strengths  during their treatment to contribute to their recovery: Pt denies Patient states these barriers may affect/interfere with their treatment: Pt denies Patient states these barriers may affect their return to the community: Pt denies  Discharge Plan:   Currently receiving community mental health services: Yes (From Whom) (Per chart review, pt has psychiatrist however cannot remember name) Patient states concerns and preferences for aftercare planning are: Pt states that she is not sure Patient states they will know when they are safe and ready for discharge when: "i don't know" Does patient have access to transportation?: Yes Does patient have financial barriers related to discharge medications?: No Will patient be returning to same living situation after discharge?: Yes  Summary/Recommendations:   Summary and Recommendations (to be completed by the evaluator): Patient is a 80 year female, married, from [place], [place] ([place]) presenting to [facility] involuntarily. She reports that she receives SSDI has Medicare part A and part B. Per chart review pt has been depressed and attempted to end her life by forcing a fatal fall onto the floor. Recent stressors include her daughter reporting recent spinal fractures and physical health ailments, significant weight loss, and recent changes to housing along with onset of depressive symptoms of low mood, limited appetite, sleeplessness, and irritability.She has a primary diagnosis of MDD (major depressive disorder), recurrent episode, severe . Pt reports that she does not like living at [facility] but could not elaborate as to why. Patient has psychiatrist but cannot recall the name of the provider. Patient has two children who live in the area that are assisting with pt's care. Recommendations include: crisis stabilization, therapeutic milieu, encourage group attendance and participation,  medication management for detox/mood stabilization and development of comprehensive mental wellness/sobriety plan.  Tanya Walker. 01/01/2021

## 2021-01-01 NOTE — BHH Suicide Risk Assessment (Signed)
[facility] INPATIENT:  Family/Significant Other Suicide Prevention Education  Suicide Prevention Education: SPE completed with pt, as pt refused to consent to family contact. SPI pamphlet provided to pt and pt was encouraged to share information with support network, ask questions, and talk about any concerns relating to SPE. Pt denies access to guns/firearms and verbalized understanding of information provided. Mobile Crisis information also provided to pt.  Patient Refusal for Family/Significant Other Suicide Prevention Education: The patient Tanya Walker has refused to provide written consent for family/significant other to be provided Family/Significant Other Suicide Prevention Education during admission and/or prior to discharge.  Physician notified.  Patrece Tallie A Martinique 01/01/2021, 3:03 PM

## 2021-01-01 NOTE — BHH Counselor (Signed)
CSW received voicemail from pt's social worker at [facility], [place], [phone], ext: [phone]. CSW called [facility] pt's social worker  regarding pt. There was no answer, CSW left voicemail with contact information. CSW will follow up at another time.   Chasey Dull [place], MSW, LCSW-A 11/25/20223:45 PM

## 2021-01-01 NOTE — BH IP Treatment Plan (Signed)
Interdisciplinary Treatment and Diagnostic Plan Update  01/01/2021 Time of Session: 1:15PM Tanya Walker MRN: [id number]  Principal Diagnosis: MDD (major depressive disorder), recurrent episode, severe ([place])  Secondary Diagnoses: Principal Problem:   MDD (major depressive disorder), recurrent episode, severe ([place])   Current Medications:  Current Facility-Administered Medications  Medication Dose Route Frequency Provider Last Rate Last Admin   acetaminophen (TYLENOL) tablet 1,000 mg  1,000 mg Oral Q6H PRN Starkes-Perry, Gayland Curry, FNP       alum & mag hydroxide-simeth (MAALOX/MYLANTA) 200-200-20 MG/5ML suspension 30 mL  30 mL Oral Q4H PRN Starkes-Perry, Gayland Curry, FNP       aspirin chewable tablet 81 mg  81 mg Oral Daily Suella Broad, FNP   81 mg at 01/01/21 1150   cholecalciferol (VITAMIN D3) tablet 2,000 Units  2,000 Units Oral Daily Suella Broad, FNP   2,000 Units at 01/01/21 1151   docusate sodium (COLACE) capsule 100 mg  100 mg Oral QHS Suella Broad, FNP   100 mg at 12/31/20 2122   escitalopram (LEXAPRO) tablet 10 mg  10 mg Oral QPM Suella Broad, FNP   10 mg at 12/31/20 1758   feeding supplement (ENSURE ENLIVE / ENSURE PLUS) liquid 237 mL  237 mL Oral BID BM Parks Ranger, DO   237 mL at 01/01/21 1201   vitamin B-12 (CYANOCOBALAMIN) tablet 500 mcg  500 mcg Oral Daily Suella Broad, FNP   500 mcg at 12/45/80 9983   And   folic acid (FOLVITE) tablet 0.5 mg  0.5 mg Oral Daily Suella Broad, FNP   0.5 mg at 01/01/21 1149   gabapentin (NEURONTIN) capsule 100 mg  100 mg Oral TID Suella Broad, FNP   100 mg at 01/01/21 1150   loratadine (CLARITIN) tablet 10 mg  10 mg Oral Daily Suella Broad, FNP   10 mg at 01/01/21 1152   magnesium hydroxide (MILK OF MAGNESIA) suspension 30 mL  30 mL Oral Daily PRN Suella Broad, FNP       melatonin tablet 5 mg  5 mg Oral QHS PRN Parks Ranger, DO        multivitamin with minerals tablet 1 tablet  1 tablet Oral Daily Parks Ranger, DO   1 tablet at 01/01/21 1148   OLANZapine (ZYPREXA) tablet 5 mg  5 mg Oral QHS Parks Ranger, DO   5 mg at 12/31/20 2122   polyethylene glycol (MIRALAX / GLYCOLAX) packet 17 g  17 g Oral Daily PRN Starkes-Perry, Gayland Curry, FNP       senna-docusate (Senokot-S) tablet 2 tablet  2 tablet Oral Daily Suella Broad, FNP   2 tablet at 01/01/21 1151   PTA Medications: Medications Prior to Admission  Medication Sig Dispense Refill Last Dose   acetaminophen (TYLENOL) 500 MG tablet Take 1,000 mg by mouth every 6 (six) hours as needed for moderate pain, mild pain, headache or fever.      ASPIRIN 81 PO Take 1 tablet by mouth daily.      Calcium Carbonate (CALCIUM-CARB 600 PO) Take 1 tablet by mouth 2 (two) times daily.      cetirizine (ZYRTEC ALLERGY) 10 MG tablet Take 1 tablet (10 mg total) by mouth daily. 30 tablet 11    Cholecalciferol (VITAMIN D3) 2000 units capsule Take 1 capsule (2,000 Units total) by mouth daily. 100 capsule 3    Cobalamin Combinations (VITAMIN B12-FOLIC ACID PO) Place 1 lozenge under the tongue  in the morning.      docusate sodium (COLACE) 100 MG capsule Take 100 mg by mouth at bedtime.      escitalopram (LEXAPRO) 10 MG tablet Take 10 mg by mouth every evening.      gabapentin (NEURONTIN) 100 MG capsule Take 1 capsule (100 mg total) by mouth 3 (three) times daily. 90 capsule 3    KETOCONAZOLE, TOPICAL, (NIZORAL A-D) 1 % SHAM Apply 1 application topically once a week. Wednesday      melatonin 5 MG TABS Take 5 mg by mouth at bedtime.      Multiple Vitamins-Minerals (PRESERVISION AREDS 2 PO) Take 1 capsule by mouth 2 (two) times daily.      Nutritional Supplements (RESOURCE 2.0 PO) Take 180 mLs by mouth 2 (two) times daily.      polyethylene glycol (MIRALAX / GLYCOLAX) 17 g packet Take 17 g by mouth daily as needed for moderate constipation or mild constipation. (Patient taking  differently: Take 17 g by mouth daily.) 14 each 0    SALONPAS 3.02-13-08 % PTCH Apply 1 patch topically See admin instructions. Apply 1 patch to the lower back and remove (at most) after 8 hours- once a day as needed for pain      senna-docusate (SENOKOT-S) 8.6-50 MG tablet Take 2 tablets by mouth daily.      sodium phosphate (FLEET) 7-19 GM/118ML ENEM Place 1 enema rectally daily as needed for severe constipation or mild constipation.       Patient Stressors: Other:     Patient Strengths: Supportive family/friends   Treatment Modalities: Medication Management, Group therapy, Case management,  1 to 1 session with clinician, Psychoeducation, Recreational therapy.   Physician Treatment Plan for Primary Diagnosis: MDD (major depressive disorder), recurrent episode, severe ([place]) Long Term Goal(s): Improvement in symptoms so as ready for discharge   Short Term Goals: Ability to identify changes in lifestyle to reduce recurrence of condition will improve Ability to verbalize feelings will improve Ability to disclose and discuss suicidal ideas Ability to demonstrate self-control will improve Ability to identify and develop effective coping behaviors will improve Ability to maintain clinical measurements within normal limits will improve Compliance with prescribed medications will improve Ability to identify triggers associated with substance abuse/mental health issues will improve  Medication Management: Evaluate patient's response, side effects, and tolerance of medication regimen.  Therapeutic Interventions: 1 to 1 sessions, Unit Group sessions and Medication administration.  Evaluation of Outcomes: Not Met  Physician Treatment Plan for Secondary Diagnosis: Principal Problem:   MDD (major depressive disorder), recurrent episode, severe ([place])  Long Term Goal(s): Improvement in symptoms so as ready for discharge   Short Term Goals: Ability to identify changes in lifestyle to reduce  recurrence of condition will improve Ability to verbalize feelings will improve Ability to disclose and discuss suicidal ideas Ability to demonstrate self-control will improve Ability to identify and develop effective coping behaviors will improve Ability to maintain clinical measurements within normal limits will improve Compliance with prescribed medications will improve Ability to identify triggers associated with substance abuse/mental health issues will improve     Medication Management: Evaluate patient's response, side effects, and tolerance of medication regimen.  Therapeutic Interventions: 1 to 1 sessions, Unit Group sessions and Medication administration.  Evaluation of Outcomes: Not Met   RN Treatment Plan for Primary Diagnosis: MDD (major depressive disorder), recurrent episode, severe ([place]) Long Term Goal(s): Knowledge of disease and therapeutic regimen to maintain health will improve  Short Term Goals: Ability to  remain free from injury will improve, Ability to verbalize frustration and anger appropriately will improve, Ability to demonstrate self-control, Ability to participate in decision making will improve, Ability to verbalize feelings will improve, Ability to disclose and discuss suicidal ideas, Ability to identify and develop effective coping behaviors will improve, and Compliance with prescribed medications will improve  Medication Management: RN will administer medications as ordered by provider, will assess and evaluate patient's response and provide education to patient for prescribed medication. RN will report any adverse and/or side effects to prescribing provider.  Therapeutic Interventions: 1 on 1 counseling sessions, Psychoeducation, Medication administration, Evaluate responses to treatment, Monitor vital signs and CBGs as ordered, Perform/monitor CIWA, COWS, AIMS and Fall Risk screenings as ordered, Perform wound care treatments as ordered.  Evaluation of  Outcomes: Not Met   LCSW Treatment Plan for Primary Diagnosis: MDD (major depressive disorder), recurrent episode, severe ([place]) Long Term Goal(s): Safe transition to appropriate next level of care at discharge, Engage patient in therapeutic group addressing interpersonal concerns.  Short Term Goals: Engage patient in aftercare planning with referrals and resources, Increase social support, Increase ability to appropriately verbalize feelings, Increase emotional regulation, Facilitate acceptance of mental health diagnosis and concerns, Identify triggers associated with mental health/substance abuse issues, and Increase skills for wellness and recovery  Therapeutic Interventions: Assess for all discharge needs, 1 to 1 time with Social worker, Explore available resources and support systems, Assess for adequacy in community support network, Educate family and significant other(s) on suicide prevention, Complete Psychosocial Assessment, Interpersonal group therapy.  Evaluation of Outcomes: Not Met   Progress in Treatment: Attending groups: Yes. Participating in groups: Yes. Taking medication as prescribed: Yes. Toleration medication: Yes. Family/Significant other contact made: No, will contact:  when given permission Patient understands diagnosis: Yes. Discussing patient identified problems/goals with staff: No. Medical problems stabilized or resolved: Yes. Denies suicidal/homicidal ideation: Yes. Issues/concerns per patient self-inventory: No. Other: None  New problem(s) identified: No, Describe:  None  New Short Term/Long Term Goal(s): elimination of symptoms of psychosis, medication management for mood stabilization; elimination of SI thoughts; development of comprehensive mental wellness plan.   Patient Goals:  "I don't know"  Discharge Plan or Barriers: CSW will assist pt development of appropriate discharge/aftercare plan.   Reason for Continuation of Hospitalization:  Depression Medication stabilization  Estimated Length of Stay: TBD   Scribe for Treatment Team: Taquita Demby A [place], [place] 01/01/2021 1:21 PM

## 2021-01-01 NOTE — Progress Notes (Signed)
[facility] MD Progress Note  01/01/2021 2:03 PM Tanya Walker  MRN:  [id number] Subjective: Patient seen and chart reviewed.  80 year old woman with what appears to be a severe major depression.  Irritable.  Continues to endorse depression.  Continues to endorse suicidal ideation.  He will not eating very well and giving as an excuse that she does not like the food.  Staff are concerned about her not eating.  Stays in bed most of the time.  Irritable blunted affect. Principal Problem: MDD (major depressive disorder), recurrent episode, severe ([place]) Diagnosis: Principal Problem:   MDD (major depressive disorder), recurrent episode, severe ([place])  Total Time spent with patient: 30 minutes  Past Psychiatric History: Past history somewhat vague.  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   Depression    years ago   Headache    sinus headaches   Lipoma of arm    Right   Medical history non-contributory    Osteoporosis    SVD (spontaneous vaginal delivery)    x 2   Varicose vein    Vitamin D deficiency     Past Surgical History:  Procedure Laterality Date   ANTERIOR APPROACH HEMI HIP ARTHROPLASTY Right 09/04/2020   Procedure: ANTERIOR APPROACH HEMI HIP ARTHROPLASTY;  Surgeon: Rod Can, MD;  Location: [facility];  Service: Orthopedics;  Laterality: Right;   [place]   COLONOSCOPY     GANGLION CYST EXCISION     x2 left and right arm   HYSTEROSCOPY WITH D & C N/A 07/15/2013   Procedure: DILATATION AND CURETTAGE /HYSTEROSCOPY;  Surgeon: Lovenia Kim, MD;  Location: [facility];  Service: Gynecology;  Laterality: N/A;   KYPHOPLASTY Bilateral 05/04/2020   Procedure: KYPHOPLASTY THORACIC TWELVE;  Surgeon: Vallarie Mare, MD;  Location: [facility];  Service: Neurosurgery;  Laterality: Bilateral;    MANDIBLE SURGERY  1954   WISDOM TOOTH EXTRACTION     Family History:  Family History  Problem Relation Age of Onset   Hypertension Mother    Heart disease Father    Cancer Father    Family Psychiatric  History: See previous Social History:  Social History   Substance and Sexual Activity  Alcohol Use No     Social History   Substance and Sexual Activity  Drug Use No    Social History   Socioeconomic History   Marital status: Married    Spouse name: Not on file   Number of children: 1   Years of education: Not on file   Highest education level: Not on file  Occupational History   Occupation: retired  Tobacco Use   Smoking status: Never   Smokeless tobacco: Never  Vaping Use   Vaping Use: Never used  Substance and Sexual Activity   Alcohol use: No   Drug use: No   Sexual activity: Yes    Birth control/protection: Post-menopausal  Other Topics Concern   Not on file  Social History Narrative   GI in [place] for colonoscopy   Dr Ronita Hipps - GYN      Denies Surgical history      Family history of varicose veins   Mother is 96      Regular exercise - NO   Social Determinants  of Health   Financial Resource Strain: Not on file  Food Insecurity: Not on file  Transportation Needs: Not on file  Physical Activity: Not on file  Stress: Not on file  Social Connections: Not on file   Additional Social History:                         Sleep: Poor  Appetite:  Poor  Current Medications: Current Facility-Administered Medications  Medication Dose Route Frequency Provider Last Rate Last Admin   acetaminophen (TYLENOL) tablet 1,000 mg  1,000 mg Oral Q6H PRN Starkes-Perry, Gayland Curry, FNP       alum & mag hydroxide-simeth (MAALOX/MYLANTA) 200-200-20 MG/5ML suspension 30 mL  30 mL Oral Q4H PRN Starkes-Perry, Gayland Curry, FNP       aspirin chewable tablet 81 mg  81 mg Oral Daily Suella Broad, FNP   81 mg at 01/01/21 1150   cholecalciferol (VITAMIN D3) tablet  2,000 Units  2,000 Units Oral Daily Suella Broad, FNP   2,000 Units at 01/01/21 1151   docusate sodium (COLACE) capsule 100 mg  100 mg Oral QHS Suella Broad, FNP   100 mg at 12/31/20 2122   escitalopram (LEXAPRO) tablet 10 mg  10 mg Oral QPM Suella Broad, FNP   10 mg at 12/31/20 1758   feeding supplement (ENSURE ENLIVE / ENSURE PLUS) liquid 237 mL  237 mL Oral BID BM Parks Ranger, DO   237 mL at 01/01/21 1201   vitamin B-12 (CYANOCOBALAMIN) tablet 500 mcg  500 mcg Oral Daily Suella Broad, FNP   500 mcg at 02/08/70 5366   And   folic acid (FOLVITE) tablet 0.5 mg  0.5 mg Oral Daily Suella Broad, FNP   0.5 mg at 01/01/21 1149   gabapentin (NEURONTIN) capsule 100 mg  100 mg Oral TID Suella Broad, FNP   100 mg at 01/01/21 1150   loratadine (CLARITIN) tablet 10 mg  10 mg Oral Daily Suella Broad, FNP   10 mg at 01/01/21 1152   magnesium hydroxide (MILK OF MAGNESIA) suspension 30 mL  30 mL Oral Daily PRN Suella Broad, FNP       melatonin tablet 5 mg  5 mg Oral QHS PRN Parks Ranger, DO       multivitamin with minerals tablet 1 tablet  1 tablet Oral Daily Parks Ranger, DO   1 tablet at 01/01/21 1148   OLANZapine (ZYPREXA) tablet 5 mg  5 mg Oral QHS Parks Ranger, DO   5 mg at 12/31/20 2122   polyethylene glycol (MIRALAX / GLYCOLAX) packet 17 g  17 g Oral Daily PRN Starkes-Perry, Gayland Curry, FNP       senna-docusate (Senokot-S) tablet 2 tablet  2 tablet Oral Daily Suella Broad, FNP   2 tablet at 01/01/21 1151    Lab Results: No results found for this or any previous visit (from the past 48 hour(s)).  Blood Alcohol level:  Lab Results  Component Value Date   ETH <10 44/04/4740    Metabolic Disorder Labs: No results found for: HGBA1C, MPG No results found for: PROLACTIN Lab Results  Component Value Date   CHOL 201 (H) 11/01/2019   TRIG 52 11/01/2019   HDL 69 11/01/2019    CHOLHDL 2.9 11/01/2019   VLDL 11.2 12/05/2016   LDLCALC 118 (H) 11/01/2019   LDLCALC 107 (H) 12/05/2016    Physical Findings: AIMS:  , ,  ,  ,  CIWA:    COWS:     Musculoskeletal: Strength & Muscle Tone: decreased Gait & Station: unsteady Patient leans: N/A  Psychiatric Specialty Exam:  Presentation  General Appearance: Appropriate for Environment; Casual  Eye Contact:Fair  Speech:Clear and Coherent; Normal Rate  Speech Volume:Decreased  Handedness:Right   Mood and Affect  Mood:Depressed  Affect:Depressed; Flat   Thought Process  Thought Processes:Coherent; Linear  Descriptions of Associations:Intact  Orientation:Full (Time, Place and Person)  Thought Content:Logical  History of Schizophrenia/Schizoaffective disorder:No  Duration of Psychotic Symptoms:N/A  Hallucinations:No data recorded Ideas of Reference:None  Suicidal Thoughts:No data recorded Homicidal Thoughts:No data recorded  Sensorium  Memory:Immediate Good; Recent Good; Remote Fair  Judgment:Fair  Insight:Fair   Executive Functions  Concentration:Fair  Attention Span:Fair  [place]   Psychomotor Activity  Psychomotor Activity:No data recorded  Assets  Assets:Desire for Improvement; Communication Skills; Leisure Time; Catering manager; Housing; Physical Health   Sleep  Sleep:No data recorded   Physical Exam: Physical Exam Vitals and nursing note reviewed.  Constitutional:      Appearance: Normal appearance.  HENT:     Head: Normocephalic and atraumatic.     Mouth/Throat:     Pharynx: Oropharynx is clear.  Eyes:     Pupils: Pupils are equal, round, and reactive to light.  Cardiovascular:     Rate and Rhythm: Normal rate and regular rhythm.  Pulmonary:     Effort: Pulmonary effort is normal.     Breath sounds: Normal breath sounds.  Abdominal:     General: Abdomen is flat.     Palpations: Abdomen is  soft.  Musculoskeletal:        General: Normal range of motion.  Skin:    General: Skin is warm and dry.  Neurological:     General: No focal deficit present.     Mental Status: She is alert. Mental status is at baseline.  Psychiatric:        Attention and Perception: She is inattentive.        Mood and Affect: Mood is anxious and depressed. Affect is labile.        Speech: Speech normal.        Behavior: Behavior is agitated. Behavior is not aggressive.        Thought Content: Thought content includes suicidal ideation. Thought content does not include suicidal plan.        Cognition and Memory: Memory is impaired.   Review of Systems  Constitutional: Negative.   HENT: Negative.    Eyes: Negative.   Respiratory: Negative.    Cardiovascular: Negative.   Gastrointestinal: Negative.   Musculoskeletal:  Positive for neck pain.  Skin: Negative.   Neurological: Negative.   Psychiatric/Behavioral:  Positive for depression, hallucinations, memory loss, substance abuse and suicidal ideas. The patient is nervous/anxious and has insomnia.   Blood pressure 134/73, pulse 64, temperature 98.6 F (37 C), temperature source Oral, resp. rate 20, height 5\' 2"  (1.575 m), weight 39.5 kg, SpO2 99 %. Body mass index is 15.91 kg/m.   Treatment Plan Summary: Plan continue current medication.  Lexapro and Zyprexa.  Patient compliant with medicine.  Tried to do some psychoeducation and encouragement with her.  Staff are aware of her limitations.  If anything would help her with eating more providing extra food anything of the sort it should probably be considered.  Patient denies any intention to act on suicidal thoughts in the hospital.  Alethia Berthold, MD 01/01/2021, 2:03 PM

## 2021-01-01 NOTE — Progress Notes (Signed)
Margarita Grizzle patient's husband did call for an update. Spouse did request visiting hours. Writer did inform Spouse and encourage participation with treatment plan.

## 2021-01-01 NOTE — Plan of Care (Signed)
Pt presents irritable but cooperative.  Pt denies SI/HI/Avh and contracts for safety.  Patient did verbalize SA as reason for hospitalization.  Pt states "I tried to fall and hurt myself".  Pt rates Depression 10/10 and nausea  but declines prn medications.  Pt did comply with scheduled medications per Provider. Pt denies any adverse reactions from medications given. Pt had minimal participation in therapeutic milieu. Pt did eat small amount of special ordered meal.  Q 15 minute safety rounding in place.  Plan of care will continue.      Problem: Activity: Goal: Sleeping patterns will improve Outcome: Progressing   Problem: Education: Goal: Knowledge of [place] [facility] Education information/materials will improve Outcome: Not Progressing Goal: Emotional status will improve Outcome: Not Progressing Goal: Mental status will improve Outcome: Not Progressing   Problem: Activity: Goal: Interest or engagement in activities will improve Outcome: Not Progressing

## 2021-01-01 NOTE — Progress Notes (Signed)
Patient is calm and cooperative. Pt is passing gas and stated she has not had a full BM in the past few days. Pt was given docusate sodium as scheduled and encouraged to drink fluids. Pt denies SI, HI, AVH, anxiety, and depression. Pt appears to be preoccupied most of the time and is minimal in interaction. Pt has a poor appetite and is encouraged to eat and drink more. Will continue to monitor with q 15 minute safety checks.

## 2021-01-02 NOTE — Progress Notes (Signed)
[facility] MD Progress Note  01/02/2021 2:53 PM Tanya Walker  MRN:  [id number] Subjective: Patient seen and chart reviewed.  80 year old woman with what appears to be a severe major depression.   Ok" Per nursing- Patient is calm and cooperative. Pt is passing gas and stated she has not had a full BM in the past few days. Pt was given docusate sodium as scheduled and encouraged to drink fluids. Pt denies SI, HI, AVH, anxiety, and depression. Pt appears to be preoccupied most of the time and is minimal in interaction. Pt has a poor appetite and is encouraged to eat and drink more. Will continue to monitor with q 15 minute safety checks  Pt seen today in her room. Continues to endorse depression, denies SI.    Restricted affect, Reports poor sleep.  Principal Problem: MDD (major depressive disorder), recurrent episode, severe ([place]) Diagnosis: Principal Problem:   MDD (major depressive disorder), recurrent episode, severe ([place])  Total Time spent with patient: 30 minutes  Past Psychiatric History: Past history somewhat vague.  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   Depression    years ago   Headache    sinus headaches   Lipoma of arm    Right   Medical history non-contributory    Osteoporosis    SVD (spontaneous vaginal delivery)    x 2   Varicose vein    Vitamin D deficiency     Past Surgical History:  Procedure Laterality Date   ANTERIOR APPROACH HEMI HIP ARTHROPLASTY Right 09/04/2020   Procedure: ANTERIOR APPROACH HEMI HIP ARTHROPLASTY;  Surgeon: Rod Can, MD;  Location: [facility];  Service: Orthopedics;  Laterality: Right;   [place]   COLONOSCOPY     GANGLION CYST EXCISION     x2 left and right arm   HYSTEROSCOPY WITH D & C N/A 07/15/2013   Procedure: DILATATION AND  CURETTAGE /HYSTEROSCOPY;  Surgeon: Lovenia Kim, MD;  Location: [facility];  Service: Gynecology;  Laterality: N/A;   KYPHOPLASTY Bilateral 05/04/2020   Procedure: KYPHOPLASTY THORACIC TWELVE;  Surgeon: Vallarie Mare, MD;  Location: [facility];  Service: Neurosurgery;  Laterality: Bilateral;   MANDIBLE SURGERY  1954   WISDOM TOOTH EXTRACTION     Family History:  Family History  Problem Relation Age of Onset   Hypertension Mother    Heart disease Father    Cancer Father    Family Psychiatric  History: See previous Social History:  Social History   Substance and Sexual Activity  Alcohol Use No     Social History   Substance and Sexual Activity  Drug Use No    Social History   Socioeconomic History   Marital status: Married    Spouse name: Not on file   Number of children: 1   Years of education: Not on file   Highest education level: Not on file  Occupational History   Occupation: retired  Tobacco Use   Smoking status: Never   Smokeless tobacco: Never  Vaping Use   Vaping Use: Never used  Substance and Sexual Activity   Alcohol use: No   Drug use: No   Sexual activity: Yes    Birth control/protection: Post-menopausal  Other Topics Concern   Not  on file  Social History Narrative   GI in [place] for colonoscopy   Dr Ronita Hipps - GYN      Denies Surgical history      Family history of varicose veins   Mother is 51      Regular exercise - NO   Social Determinants of Health   Financial Resource Strain: Not on file  Food Insecurity: Not on file  Transportation Needs: Not on file  Physical Activity: Not on file  Stress: Not on file  Social Connections: Not on file   Additional Social History:                         Sleep: Poor  Appetite:  Poor  Current Medications: Current Facility-Administered Medications  Medication Dose Route Frequency Provider Last Rate Last Admin   acetaminophen (TYLENOL) tablet 1,000 mg  1,000 mg Oral Q6H PRN Starkes-Perry,  Gayland Curry, FNP       alum & mag hydroxide-simeth (MAALOX/MYLANTA) 200-200-20 MG/5ML suspension 30 mL  30 mL Oral Q4H PRN Starkes-Perry, Gayland Curry, FNP       aspirin chewable tablet 81 mg  81 mg Oral Daily Suella Broad, FNP   81 mg at 01/02/21 0955   cholecalciferol (VITAMIN D3) tablet 2,000 Units  2,000 Units Oral Daily Suella Broad, FNP   2,000 Units at 01/02/21 0959   docusate sodium (COLACE) capsule 100 mg  100 mg Oral QHS Suella Broad, FNP   100 mg at 01/01/21 2111   escitalopram (LEXAPRO) tablet 10 mg  10 mg Oral QPM Suella Broad, FNP   10 mg at 01/01/21 1700   feeding supplement (ENSURE ENLIVE / ENSURE PLUS) liquid 237 mL  237 mL Oral BID BM Parks Ranger, DO   237 mL at 01/02/21 1401   vitamin B-12 (CYANOCOBALAMIN) tablet 500 mcg  500 mcg Oral Daily Suella Broad, FNP   500 mcg at 16/10/96 0454   And   folic acid (FOLVITE) tablet 0.5 mg  0.5 mg Oral Daily Suella Broad, FNP   0.5 mg at 01/02/21 0981   gabapentin (NEURONTIN) capsule 100 mg  100 mg Oral TID Suella Broad, FNP   100 mg at 01/02/21 1000   loratadine (CLARITIN) tablet 10 mg  10 mg Oral Daily Suella Broad, FNP   10 mg at 01/02/21 1000   magnesium hydroxide (MILK OF MAGNESIA) suspension 30 mL  30 mL Oral Daily PRN Suella Broad, FNP       melatonin tablet 5 mg  5 mg Oral QHS PRN Parks Ranger, DO       multivitamin with minerals tablet 1 tablet  1 tablet Oral Daily Parks Ranger, DO   1 tablet at 01/02/21 0957   OLANZapine (ZYPREXA) tablet 5 mg  5 mg Oral QHS Parks Ranger, DO   5 mg at 01/01/21 2111   polyethylene glycol (MIRALAX / GLYCOLAX) packet 17 g  17 g Oral Daily PRN Starkes-Perry, Gayland Curry, FNP       senna-docusate (Senokot-S) tablet 2 tablet  2 tablet Oral Daily Suella Broad, FNP   2 tablet at 01/02/21 1001    Lab Results: No results found for this or any previous visit (from the past 48  hour(s)).  Blood Alcohol level:  Lab Results  Component Value Date   ETH <10 19/14/7829    Metabolic Disorder Labs: No results found for: HGBA1C, MPG No  results found for: PROLACTIN Lab Results  Component Value Date   CHOL 201 (H) 11/01/2019   TRIG 52 11/01/2019   HDL 69 11/01/2019   CHOLHDL 2.9 11/01/2019   VLDL 11.2 12/05/2016   LDLCALC 118 (H) 11/01/2019   LDLCALC 107 (H) 12/05/2016    Physical Findings: AIMS:  , ,  ,  ,    CIWA:    COWS:     Musculoskeletal: Strength & Muscle Tone: decreased Gait & Station: unsteady Patient leans: N/A  Psychiatric Specialty Exam:  Presentation  General Appearance: Appropriate for Environment; Casual  Eye Contact:Fair  Speech:Clear and Coherent; Normal Rate  Speech Volume:Decreased  Handedness:Right   Mood and Affect  Mood:Depressed  Affect:Depressed; Flat   Thought Process  Thought Processes:Coherent; Linear  Descriptions of Associations:Intact  Orientation:Full (Time, Place and Person)  Thought Content:Logical  History of Schizophrenia/Schizoaffective disorder:No  Duration of Psychotic Symptoms:N/A  Hallucinations:No data recorded Ideas of Reference:None  Suicidal Thoughts:denies Homicidal Thoughts:denies  Sensorium  Memory:Immediate Good; Recent Good; Remote Fair  Judgment:Fair  Insight:Fair   Executive Functions  Concentration:Fair  Attention Span:Fair  [place]   Psychomotor Activity  Psychomotor Activity:No data recorded  Assets  Assets:Desire for Improvement; Armed forces logistics/support/administrative officer; Leisure Time; Catering manager; Housing; Physical Health   Sleep  Sleep:No data recorded   Physical Exam: Physical Exam Vitals and nursing note reviewed.  Constitutional:      Appearance: Normal appearance.  HENT:     Head: Normocephalic and atraumatic.     Mouth/Throat:     Pharynx: Oropharynx is clear.  Eyes:     Pupils: Pupils  are equal, round, and reactive to light.  Cardiovascular:     Rate and Rhythm: Normal rate and regular rhythm.  Pulmonary:     Effort: Pulmonary effort is normal.  Abdominal:     General: Abdomen is flat.  Musculoskeletal:        General: Normal range of motion.  Skin:    General: Skin is warm and dry.  Neurological:     General: No focal deficit present.     Mental Status: She is alert. Mental status is at baseline.  Psychiatric:        Attention and Perception: She is inattentive.        Mood and Affect: Mood is anxious and depressed. Affect is labile.        Speech: Speech normal.        Behavior: Behavior is agitated. Behavior is not aggressive.        Thought Content: Thought content includes suicidal ideation. Thought content does not include suicidal plan.        Cognition and Memory: Memory is impaired.   Review of Systems  Constitutional: Negative.   HENT: Negative.    Eyes: Negative.   Respiratory: Negative.    Cardiovascular: Negative.   Gastrointestinal: Negative.   Musculoskeletal:  Positive for neck pain.  Skin: Negative.   Neurological: Negative.   Psychiatric/Behavioral:  Positive for depression, hallucinations, memory loss, substance abuse and suicidal ideas. The patient is nervous/anxious and has insomnia.   Blood pressure 120/82, pulse 68, temperature 97.6 F (36.4 C), temperature source Oral, resp. rate 18, height 5\' 2"  (1.575 m), weight 39.5 kg, SpO2 98 %. Body mass index is 15.91 kg/m.   Treatment Plan Summary: Pt with depression, still depressed, poor appetite and poor sleep. Cont lexapro for depression Zyprexa for mood Melatonin for sleep, trazodone for sleep MV for supplement Group and milieu  tx as tolerated.   Lenward Chancellor, MD 01/02/2021, 2:53 PM Patient ID: Tanya Walker, female   DOB: 23-Feb-1940, 80 y.o.   MRN: [id number]

## 2021-01-02 NOTE — Progress Notes (Signed)
Pt often in her room, she does come out for brief periods of time. She walked/exercised around the unit with her walker. She did not go outside to the courtyard with the group, because she does not have shoes. Pt attended the social worker group.  No suicidal behavior verbalized, pleasant on approach and no complaints.She comes to the dayroom to eat, but quickly goes back to her room.

## 2021-01-02 NOTE — Progress Notes (Signed)
Patient is calm and cooperative but appears as anxious. Pt had a phone call from husband and was confused as to why she was here. She said she does not remember having a suicide attempt and neither does her husband. Pt was reoriented. Pt denies pain. Pt has not had a BM in the last few days. Pt given milk of mag, Miralax PRN at 1721 and Colace as scheduled. Pt also encouraged and given fluids to drink. Pt reports having a low appetite and not eating well. Pt denies SI, HI, AVH at this time. Pt denies feelings of anxiety and depression. Will continue to monitor with q 15 minute safety checks.

## 2021-01-02 NOTE — Progress Notes (Signed)
Pt was up this morning, and asking for her eggs that did not come up for breakfast. She often goes back to her room and lays down. Pt easy to engage, but minimal interaction with peers. Pt has short and long term memory loss, she can't remember all the details about what brought her here. She stated she was here for suicidal thoughts, "wanting to fall to hit her head." She denies SI/HI and AVH, with no overt signs of emotional distress. Pt does well with the walker. She needs assess with ADL's and uses a diaper for incontinence. She needs assistance with cutting her food up in small piece to eat. RN encouraged her to ambulate in the hallway for exercise.

## 2021-01-02 NOTE — Group Note (Signed)
LCSW Group Therapy Note  Group Date: 01/02/2021 Start Time: 1410 End Time: 1535   Type of Therapy and Topic:  Group Therapy - Healthy vs Unhealthy Coping Skills  Participation Level:  Active   Description of Group The focus of this group was to determine what unhealthy coping techniques typically are used by group members and what healthy coping techniques would be helpful in coping with various problems. Patients were guided in becoming aware of the differences between healthy and unhealthy coping techniques. Patients were asked to identify 2-3 healthy coping skills they would like to learn to use more effectively.  Therapeutic Goals Patients learned that coping is what human beings do all day long to deal with various situations in their lives Patients defined and discussed healthy vs unhealthy coping techniques Patients identified their preferred coping techniques and identified whether these were healthy or unhealthy Patients determined 2-3 healthy coping skills they would like to become more familiar with and use more often. Patients provided support and ideas to each other   Summary of Patient Progress: Patient was present for the entirety of the group session. Patient was an active listener and participated in the topic of discussion. Patient presented with a restricted affect, but remained cooperative throughout the session. Patient shared that her husband is supportive but she does not like talking with her family about her "mental issues" because they do not understand. Patient shared that she often feels tired and her physical limitations make her feel angry. Patient shared that she used to journal in the past and inquired about what types of topics people journal about.    Therapeutic Modalities Cognitive Behavioral Therapy Motivational Interviewing  Sherilyn Dacosta 01/02/2021  4:34 PM

## 2021-01-02 NOTE — Progress Notes (Signed)
Pt stated she has not has a bowel movement for 2 weeks. I gave her two prn's for constipation.

## 2021-01-02 NOTE — Plan of Care (Signed)
See progress note Problem: Education: Goal: Knowledge of [facility] Education information/materials will improve Outcome: Progressing Goal: Emotional status will improve Outcome: Progressing Goal: Mental status will improve Outcome: Progressing   Problem: Activity: Goal: Interest or engagement in activities will improve Outcome: Progressing Goal: Sleeping patterns will improve Outcome: Progressing   Problem: Health Behavior/Discharge Planning: Goal: Compliance with treatment plan for underlying cause of condition will improve Outcome: Progressing   Problem: Safety: Goal: Periods of time without injury will increase Outcome: Progressing

## 2021-01-03 NOTE — Progress Notes (Signed)
[facility] MD Progress Note  01/03/2021 1:43 PM Tanya Walker  MRN:  [id number] Subjective: Patient seen and chart reviewed.  80 year old woman with what appears to be a severe major depression.   Ok" Per nursing- Patient is calm and cooperative but appears as anxious. Pt had a phone call from husband and was confused as to why she was here. She said she does not remember having a suicide attempt and neither does her husband. Pt was reoriented. Pt denies pain. Pt has not had a BM in the last few days. Pt given milk of mag, Miralax PRN at 1721 and Colace as scheduled. Pt also encouraged and given fluids to drink. Pt reports having a low appetite and not eating well. Pt denies SI, HI, AVH at this time. Pt denies feelings of anxiety and depression. Will continue to monitor with q 15 minute safety checks.  Pt seen today in her room. Pt sleepy but easily arousable, Continues to endorse depression, denies SI.    Restricted affect, ox2.  Principal Problem: MDD (major depressive disorder), recurrent episode, severe ([place]) Diagnosis: Principal Problem:   MDD (major depressive disorder), recurrent episode, severe ([place])  Total Time spent with patient: 30 minutes  Past Psychiatric History: Past history somewhat vague.  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   Depression    years ago   Headache    sinus headaches   Lipoma of arm    Right   Medical history non-contributory    Osteoporosis    SVD (spontaneous vaginal delivery)    x 2   Varicose vein    Vitamin D deficiency     Past Surgical History:  Procedure Laterality Date   ANTERIOR APPROACH HEMI HIP ARTHROPLASTY Right 09/04/2020   Procedure: ANTERIOR APPROACH HEMI HIP ARTHROPLASTY;  Surgeon: Rod Can, MD;  Location: [facility];  Service: Orthopedics;  Laterality: Right;    [place]   COLONOSCOPY     GANGLION CYST EXCISION     x2 left and right arm   HYSTEROSCOPY WITH D & C N/A 07/15/2013   Procedure: DILATATION AND CURETTAGE /HYSTEROSCOPY;  Surgeon: Lovenia Kim, MD;  Location: [facility];  Service: Gynecology;  Laterality: N/A;   KYPHOPLASTY Bilateral 05/04/2020   Procedure: KYPHOPLASTY THORACIC TWELVE;  Surgeon: Vallarie Mare, MD;  Location: [facility];  Service: Neurosurgery;  Laterality: Bilateral;   MANDIBLE SURGERY  1954   WISDOM TOOTH EXTRACTION     Family History:  Family History  Problem Relation Age of Onset   Hypertension Mother    Heart disease Father    Cancer Father    Family Psychiatric  History: See previous Social History:  Social History   Substance and Sexual Activity  Alcohol Use No     Social History   Substance and Sexual Activity  Drug Use No    Social History   Socioeconomic History   Marital status: Married    Spouse name: Not on file   Number of children: 1   Years of education: Not on file   Highest education level: Not on file  Occupational History   Occupation: retired  Tobacco Use   Smoking status: Never   Smokeless tobacco: Never  Vaping Use   Vaping  Use: Never used  Substance and Sexual Activity   Alcohol use: No   Drug use: No   Sexual activity: Yes    Birth control/protection: Post-menopausal  Other Topics Concern   Not on file  Social History Narrative   GI in [place] for colonoscopy   Dr Ronita Hipps - GYN      Denies Surgical history      Family history of varicose veins   Mother is 19      Regular exercise - NO   Social Determinants of Health   Financial Resource Strain: Not on file  Food Insecurity: Not on file  Transportation Needs: Not on file  Physical Activity: Not on file  Stress: Not on file  Social Connections: Not on file   Additional Social History:                         Sleep: Poor  Appetite:  Poor  Current  Medications: Current Facility-Administered Medications  Medication Dose Route Frequency Provider Last Rate Last Admin   acetaminophen (TYLENOL) tablet 1,000 mg  1,000 mg Oral Q6H PRN Starkes-Perry, Gayland Curry, FNP       alum & mag hydroxide-simeth (MAALOX/MYLANTA) 200-200-20 MG/5ML suspension 30 mL  30 mL Oral Q4H PRN Starkes-Perry, Gayland Curry, FNP       aspirin chewable tablet 81 mg  81 mg Oral Daily Suella Broad, FNP   81 mg at 01/03/21 0919   cholecalciferol (VITAMIN D3) tablet 2,000 Units  2,000 Units Oral Daily Suella Broad, FNP   2,000 Units at 01/03/21 0919   docusate sodium (COLACE) capsule 100 mg  100 mg Oral QHS Suella Broad, FNP   100 mg at 01/02/21 2106   escitalopram (LEXAPRO) tablet 10 mg  10 mg Oral QPM Suella Broad, FNP   10 mg at 01/02/21 1747   feeding supplement (ENSURE ENLIVE / ENSURE PLUS) liquid 237 mL  237 mL Oral BID BM Parks Ranger, DO   237 mL at 01/03/21 0175   vitamin B-12 (CYANOCOBALAMIN) tablet 500 mcg  500 mcg Oral Daily Suella Broad, FNP   500 mcg at 12/02/83 2778   And   folic acid (FOLVITE) tablet 0.5 mg  0.5 mg Oral Daily Suella Broad, FNP   0.5 mg at 01/03/21 0917   gabapentin (NEURONTIN) capsule 100 mg  100 mg Oral TID Suella Broad, FNP   100 mg at 01/03/21 0919   loratadine (CLARITIN) tablet 10 mg  10 mg Oral Daily Suella Broad, FNP   10 mg at 01/03/21 0919   magnesium hydroxide (MILK OF MAGNESIA) suspension 30 mL  30 mL Oral Daily PRN Suella Broad, FNP   30 mL at 01/02/21 1722   melatonin tablet 5 mg  5 mg Oral QHS PRN Parks Ranger, DO       multivitamin with minerals tablet 1 tablet  1 tablet Oral Daily Parks Ranger, DO   1 tablet at 01/03/21 0918   OLANZapine (ZYPREXA) tablet 5 mg  5 mg Oral QHS Parks Ranger, DO   5 mg at 01/02/21 2106   polyethylene glycol (MIRALAX / GLYCOLAX) packet 17 g  17 g Oral Daily PRN Suella Broad, FNP   17 g at 01/02/21 1721   senna-docusate (Senokot-S) tablet 2 tablet  2 tablet Oral Daily Suella Broad, FNP   2 tablet at 01/03/21 2423    Lab Results: No  results found for this or any previous visit (from the past 56 hour(s)).  Blood Alcohol level:  Lab Results  Component Value Date   ETH <10 81/82/9937    Metabolic Disorder Labs: No results found for: HGBA1C, MPG No results found for: PROLACTIN Lab Results  Component Value Date   CHOL 201 (H) 11/01/2019   TRIG 52 11/01/2019   HDL 69 11/01/2019   CHOLHDL 2.9 11/01/2019   VLDL 11.2 12/05/2016   LDLCALC 118 (H) 11/01/2019   LDLCALC 107 (H) 12/05/2016    Physical Findings: AIMS:  , ,  ,  ,    CIWA:    COWS:     Musculoskeletal: Strength & Muscle Tone: decreased Gait & Station: unsteady Patient leans: N/A  Psychiatric Specialty Exam:  Presentation  General Appearance: Appropriate for Environment; Casual  Eye Contact:Fair  Speech:Clear and Coherent; Normal Rate  Speech Volume:Decreased  Handedness:Right   Mood and Affect  Mood:Depressed  Affect:Depressed; Flat   Thought Process  Thought Processes:Coherent; Linear  Descriptions of Associations:Intact  Orientation:Full (Time, Place and Person) Sleepy but easily arousable Thought Content:Logical  History of Schizophrenia/Schizoaffective disorder:No  Duration of Psychotic Symptoms:N/A  Hallucinations:No data recorded Ideas of Reference:None  Suicidal Thoughts:denies Homicidal Thoughts:denies  Sensorium  Memory:Immediate Good; Recent Good; Remote Fair  Judgment:Fair  Insight:Fair   Executive Functions  Concentration:Fair  Attention Span:Fair  [place]   Psychomotor Activity  Psychomotor Activity:No data recorded  Assets  Assets:Desire for Improvement; Armed forces logistics/support/administrative officer; Leisure Time; Catering manager; Housing; Physical Health   Sleep  Sleep:No data  recorded   Physical Exam: Physical Exam Vitals and nursing note reviewed.  Constitutional:      Appearance: Normal appearance.  HENT:     Head: Normocephalic and atraumatic.     Mouth/Throat:     Pharynx: Oropharynx is clear.  Eyes:     Pupils: Pupils are equal, round, and reactive to light.  Cardiovascular:     Rate and Rhythm: Normal rate and regular rhythm.  Pulmonary:     Effort: Pulmonary effort is normal.  Abdominal:     General: Abdomen is flat.  Musculoskeletal:        General: Normal range of motion.  Skin:    General: Skin is warm and dry.  Neurological:     General: No focal deficit present.     Mental Status: She is alert. Mental status is at baseline.  Psychiatric:        Attention and Perception: She is inattentive.        Mood and Affect: Mood is anxious and depressed. Affect is labile.        Speech: Speech normal.        Behavior: Behavior is agitated. Behavior is not aggressive.        Thought Content: Thought content includes suicidal ideation. Thought content does not include suicidal plan.        Cognition and Memory: Memory is impaired.   Review of Systems  Constitutional: Negative.   HENT: Negative.    Eyes: Negative.   Respiratory: Negative.    Cardiovascular: Negative.   Gastrointestinal: Negative.   Musculoskeletal:  Positive for neck pain.  Skin: Negative.   Neurological: Negative.   Psychiatric/Behavioral:  Positive for depression, hallucinations, memory loss, substance abuse and suicidal ideas. The patient is nervous/anxious and has insomnia.   Blood pressure (!) 141/78, pulse 66, temperature 97.8 F (36.6 C), temperature source Oral, resp. rate 18, height 5\' 2"  (1.575 m),  weight 36.8 kg, SpO2 99 %. Body mass index is 14.84 kg/m.   Treatment Plan Summary: Pt with depression, still depressed, possible cognitive impairment. Sleepy but arousable this morning, will monitor.  Cont lexapro for depression Zyprexa for mood Melatonin for  sleep, trazodone for sleep MV for supplement Group and milieu tx as tolerated.   Lenward Chancellor, MD 01/03/2021, 1:43 PM Patient ID: Tanya Walker, female   DOB: 10-01-1940, 80 y.o.   MRN: [id number] Patient ID: Tanya Walker, female   DOB: Jun 10, 1940, 80 y.o.   MRN: [id number]

## 2021-01-03 NOTE — Plan of Care (Signed)
Patient presents Depressed during assessment.  Patient denies SI/HI/Avh and contracts for safety.  Patient did verbalize 5/10 Depression "I always feel depressed".  Patient did have increased participation in therapeutic milieu on unit during shift.  Noted patient taking a walk to improve mobility. Patient denies pain thus far during shift. Patient did improve nutritional intake during shift. Informed patient to notify nursing staff if any needs arise.  Q 15 minute safety rounds in place.  Plan of care will continue.   Problem: Education: Goal: Knowledge of [facility] Education information/materials will improve Outcome: Progressing Goal: Emotional status will improve Outcome: Progressing Goal: Mental status will improve Outcome: Progressing   Problem: Activity: Goal: Interest or engagement in activities will improve Outcome: Progressing Goal: Sleeping patterns will improve Outcome: Progressing

## 2021-01-03 NOTE — Progress Notes (Signed)
Patient is calm and cooperative but appears as anxious. Pt voiced no complaints to this Probation officer. Pt denies SI, HI, AH, and VH. Pt was given diapers and supplies to clean self and change into clean clothes. Pt is now clean and in bed sleeping. Will continue to monitor with q 15 minute safety checks.

## 2021-01-03 NOTE — Group Note (Signed)
LCSW Group Therapy Note  Group Date: 01/03/2021 Start Time: 1500 End Time: 1545   Type of Therapy and Topic:  Group Therapy - How To Cope with Nervousness about Discharge   Participation Level:  Did Not Attend   Description of Group This process group involved identification of patients' feelings about discharge. Some of them are scheduled to be discharged soon, while others are new admissions, but each of them was asked to share thoughts and feelings surrounding discharge from the hospital. One common theme was that they are excited at the prospect of going home, while another was that many of them are apprehensive about sharing why they were hospitalized. Patients were given the opportunity to discuss these feelings with their peers in preparation for discharge.  Therapeutic Goals  Patient will identify their overall feelings about pending discharge. Patient will think about how they might proactively address issues that they believe will once again arise once they get home (i.e. with parents). Patients will participate in discussion about having hope for change.   Summary of Patient Progress: Patient did not attend group despite encouraged participation.   Therapeutic Modalities Cognitive Behavioral Therapy   Sherilyn Dacosta 01/03/2021  3:56 PM

## 2021-01-04 NOTE — Group Note (Signed)
[facility] LCSW Group Therapy Note    Group Date: 01/04/2021 Start Time: 1030 End Time: 1130  Type of Therapy and Topic:  Group Therapy:  Overcoming Obstacles  Participation Level:  [facility] PARTICIPATION LEVEL: Did Not Attend    Description of Group:   In this group patients will be encouraged to explore what they see as obstacles to their own wellness and recovery. They will be guided to discuss their thoughts, feelings, and behaviors related to these obstacles. The group will process together ways to cope with barriers, with attention given to specific choices patients can make. Each patient will be challenged to identify changes they are motivated to make in order to overcome their obstacles. This group will be process-oriented, with patients participating in exploration of their own experiences as well as giving and receiving support and challenge from other group members.  Therapeutic Goals: 1. Patient will identify personal and current obstacles as they relate to admission. 2. Patient will identify barriers that currently interfere with their wellness or overcoming obstacles.  3. Patient will identify feelings, thought process and behaviors related to these barriers. 4. Patient will identify two changes they are willing to make to overcome these obstacles:    Summary of Patient Progress   X   Therapeutic Modalities:   Cognitive Behavioral Therapy Solution Focused Therapy Motivational Interviewing Relapse Prevention Therapy   Amin Fornwalt A [place], LCSWA

## 2021-01-04 NOTE — Plan of Care (Signed)
Patient remain calm and cooperative. Denies pain, report anxiety and depression rating 5/10. Denies SI, HI, and AVH. Contract for safety. Report sleeping ok. Appetite fair. Ate breakfast in the day room among staff and peers and tolerated well. Remain safe on the unit with q15 minute safety checks.   Problem: Education: Goal: Knowledge of [facility] Education information/materials will improve Outcome: Progressing Goal: Emotional status will improve Outcome: Progressing Goal: Mental status will improve Outcome: Progressing   Problem: Activity: Goal: Interest or engagement in activities will improve Outcome: Progressing Goal: Sleeping patterns will improve Outcome: Progressing   Problem: Health Behavior/Discharge Planning: Goal: Compliance with treatment plan for underlying cause of condition will improve Outcome: Progressing   Problem: Safety: Goal: Periods of time without injury will increase Outcome: Progressing   Problem: Education: Goal: Knowledge of General Education information will improve Description: Including pain rating scale, medication(s)/side effects and non-pharmacologic comfort measures Outcome: Progressing   Problem: Health Behavior/Discharge Planning: Goal: Ability to manage health-related needs will improve Outcome: Progressing   Problem: Clinical Measurements: Goal: Ability to maintain clinical measurements within normal limits will improve Outcome: Progressing Goal: Will remain free from infection Outcome: Progressing Goal: Diagnostic test results will improve Outcome: Progressing Goal: Respiratory complications will improve Outcome: Progressing Goal: Cardiovascular complication will be avoided Outcome: Progressing   Problem: Activity: Goal: Risk for activity intolerance will decrease Outcome: Progressing   Problem: Nutrition: Goal: Adequate nutrition will be maintained Outcome: Progressing   Problem: Coping: Goal: Level of anxiety  will decrease Outcome: Progressing   Problem: Elimination: Goal: Will not experience complications related to bowel motility Outcome: Progressing Goal: Will not experience complications related to urinary retention Outcome: Progressing   Problem: Pain Managment: Goal: General experience of comfort will improve Outcome: Progressing   Problem: Safety: Goal: Ability to remain free from injury will improve Outcome: Progressing   Problem: Skin Integrity: Goal: Risk for impaired skin integrity will decrease Outcome: Progressing

## 2021-01-04 NOTE — BHH Counselor (Signed)
CSW contacted pt's social worker, Angelina Sheriff, [phone], [phone]: [phone] at [facility] to update on pt's progress. She stated that upon discharge, the plan is for pt to return to skilled care unit at  the facility.   She provided contact information for Skilled Nursing social worker, Angela Nevin, [phone].  CSW will coordinate with social worker upon pending discharge date.

## 2021-01-04 NOTE — Progress Notes (Signed)
[facility] MD Progress Note  01/04/2021 10:38 AM Tanya Walker  MRN:  [id number] Subjective: Tanya Walker continues to have suicidal ideas.  She does not have a plan.  She is still very withdrawn but she is eating better.  She is taking her medications as prescribed and denies any side effects.  There is no evidence of EPS or TD.  She came in on Lexapro 10 mg/day and I started Zyprexa at bedtime because she is pretty underweight.  She is sleeping well.  She is able to contract for safety in the hospital.  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   Depression    years ago   Headache    sinus headaches   Lipoma of arm    Right   Medical history non-contributory    Osteoporosis    SVD (spontaneous vaginal delivery)    x 2   Varicose vein    Vitamin D deficiency     Past Surgical History:  Procedure Laterality Date   ANTERIOR APPROACH HEMI HIP ARTHROPLASTY Right 09/04/2020   Procedure: ANTERIOR APPROACH HEMI HIP ARTHROPLASTY;  Surgeon: Rod Can, MD;  Location: [facility];  Service: Orthopedics;  Laterality: Right;   [place]   COLONOSCOPY     GANGLION CYST EXCISION     x2 left and right arm   HYSTEROSCOPY WITH D & C N/A 07/15/2013   Procedure: DILATATION AND CURETTAGE /HYSTEROSCOPY;  Surgeon: Lovenia Kim, MD;  Location: [facility];  Service: Gynecology;  Laterality: N/A;   KYPHOPLASTY Bilateral 05/04/2020   Procedure: KYPHOPLASTY THORACIC TWELVE;  Surgeon: Vallarie Mare, MD;  Location: [facility];  Service: Neurosurgery;  Laterality: Bilateral;   MANDIBLE SURGERY  1954   WISDOM TOOTH EXTRACTION     Family  History:  Family History  Problem Relation Age of Onset   Hypertension Mother    Heart disease Father    Cancer Father    Family Psychiatric  History: Unremarkable Social History:  Social History   Substance and Sexual Activity  Alcohol Use No     Social History   Substance and Sexual Activity  Drug Use No    Social History   Socioeconomic History   Marital status: Married    Spouse name: Not on file   Number of children: 1   Years of education: Not on file   Highest education level: Not on file  Occupational History   Occupation: retired  Tobacco Use   Smoking status: Never   Smokeless tobacco: Never  Vaping Use   Vaping Use: Never used  Substance and Sexual Activity   Alcohol use: No   Drug use: No   Sexual activity: Yes    Birth control/protection: Post-menopausal  Other Topics Concern   Not on file  Social History Narrative   GI in [place] for colonoscopy   Dr Ronita Hipps - GYN      Denies Surgical history      Family history of varicose veins   Mother is 87      Regular exercise - NO   Social Determinants of Health   Financial  Resource Strain: Not on file  Food Insecurity: Not on file  Transportation Needs: Not on file  Physical Activity: Not on file  Stress: Not on file  Social Connections: Not on file   Additional Social History:                         Sleep: Good  Appetite:  Good  Current Medications: Current Facility-Administered Medications  Medication Dose Route Frequency Provider Last Rate Last Admin   acetaminophen (TYLENOL) tablet 1,000 mg  1,000 mg Oral Q6H PRN Starkes-Perry, Gayland Curry, FNP       alum & mag hydroxide-simeth (MAALOX/MYLANTA) 200-200-20 MG/5ML suspension 30 mL  30 mL Oral Q4H PRN Starkes-Perry, Gayland Curry, FNP       aspirin chewable tablet 81 mg  81 mg Oral Daily Suella Broad, FNP   81 mg at 01/04/21 3244   cholecalciferol (VITAMIN D3) tablet 2,000 Units  2,000 Units Oral Daily Suella Broad, FNP    2,000 Units at 01/04/21 0102   docusate sodium (COLACE) capsule 100 mg  100 mg Oral QHS Suella Broad, FNP   100 mg at 01/03/21 2102   escitalopram (LEXAPRO) tablet 10 mg  10 mg Oral QPM Suella Broad, FNP   10 mg at 01/03/21 1720   feeding supplement (ENSURE ENLIVE / ENSURE PLUS) liquid 237 mL  237 mL Oral BID BM Parks Ranger, DO   237 mL at 01/04/21 1002   vitamin B-12 (CYANOCOBALAMIN) tablet 500 mcg  500 mcg Oral Daily Suella Broad, FNP   500 mcg at 72/53/66 4403   And   folic acid (FOLVITE) tablet 0.5 mg  0.5 mg Oral Daily Suella Broad, FNP   0.5 mg at 01/04/21 0953   gabapentin (NEURONTIN) capsule 100 mg  100 mg Oral TID Suella Broad, FNP   100 mg at 01/04/21 0953   loratadine (CLARITIN) tablet 10 mg  10 mg Oral Daily Suella Broad, FNP   10 mg at 01/04/21 0955   magnesium hydroxide (MILK OF MAGNESIA) suspension 30 mL  30 mL Oral Daily PRN Suella Broad, FNP   30 mL at 01/02/21 1722   melatonin tablet 5 mg  5 mg Oral QHS PRN Parks Ranger, DO       multivitamin with minerals tablet 1 tablet  1 tablet Oral Daily Parks Ranger, DO   1 tablet at 01/04/21 0952   OLANZapine (ZYPREXA) tablet 5 mg  5 mg Oral QHS Parks Ranger, DO   5 mg at 01/03/21 2102   polyethylene glycol (MIRALAX / GLYCOLAX) packet 17 g  17 g Oral Daily PRN Suella Broad, FNP   17 g at 01/02/21 1721   senna-docusate (Senokot-S) tablet 2 tablet  2 tablet Oral Daily Suella Broad, FNP   2 tablet at 01/04/21 1001    Lab Results: No results found for this or any previous visit (from the past 48 hour(s)).  Blood Alcohol level:  Lab Results  Component Value Date   ETH <10 47/42/5956    Metabolic Disorder Labs: No results found for: HGBA1C, MPG No results found for: PROLACTIN Lab Results  Component Value Date   CHOL 201 (H) 11/01/2019   TRIG 52 11/01/2019   HDL 69 11/01/2019   CHOLHDL 2.9 11/01/2019    VLDL 11.2 12/05/2016   LDLCALC 118 (H) 11/01/2019   LDLCALC 107 (H) 12/05/2016    Physical Findings: AIMS:  , ,  ,  ,  CIWA:    COWS:     Musculoskeletal: Strength & Muscle Tone: within normal limits Gait & Station: normal Patient leans: N/A  Psychiatric Specialty Exam:  Presentation  General Appearance: Appropriate for Environment; Casual  Eye Contact:Fair  Speech:Clear and Coherent; Normal Rate  Speech Volume:Decreased  Handedness:Right   Mood and Affect  Mood:Depressed  Affect:Depressed; Flat   Thought Process  Thought Processes:Coherent; Linear  Descriptions of Associations:Intact  Orientation:Full (Time, Place and Person)  Thought Content:Logical  History of Schizophrenia/Schizoaffective disorder:No  Duration of Psychotic Symptoms:N/A  Hallucinations:No data recorded Ideas of Reference:None  Suicidal Thoughts:No data recorded Homicidal Thoughts:No data recorded  Sensorium  Memory:Immediate Good; Recent Good; Remote Fair  Judgment:Fair  Insight:Fair   Executive Functions  Concentration:Fair  Attention Span:Fair  [place]   Psychomotor Activity  Psychomotor Activity:No data recorded  Assets  Assets:Desire for Improvement; Communication Skills; Leisure Time; Catering manager; Housing; Physical Health   Sleep  Sleep:No data recorded    Physical Exam Constitutional:      Appearance: She is underweight.  Neurological:     General: No focal deficit present.     Mental Status: She is alert and oriented to person, place, and time.  Psychiatric:        Attention and Perception: Attention and perception normal.        Mood and Affect: Mood is depressed. Affect is flat.        Speech: Speech normal.        Behavior: Behavior is withdrawn. Behavior is cooperative.        Thought Content: Thought content includes suicidal ideation.        Cognition and Memory: Cognition  normal. She exhibits impaired remote memory.        Judgment: Judgment normal.   Review of Systems  Constitutional:  Positive for weight loss.  Neurological: Negative.   Psychiatric/Behavioral:  Positive for depression, memory loss and suicidal ideas.   Blood pressure 138/79, pulse 66, temperature 97.7 F (36.5 C), temperature source Oral, resp. rate 16, height 5\' 2"  (1.575 m), weight 36.8 kg, SpO2 100 %. Body mass index is 14.84 kg/m.   Treatment Plan Summary: Daily contact with patient to assess and evaluate symptoms and progress in treatment, Medication management, and Plan continue current medications.  Consider switching Lexapro to Effexor.  Parks Ranger, DO 01/04/2021, 10:38 AM

## 2021-01-05 MED ORDER — FLUOXETINE HCL 20 MG PO CAPS
20.0000 mg | ORAL_CAPSULE | Freq: Every day | ORAL | Status: DC
  Administered 2021-01-05 – 2021-01-12 (×8): 20 mg via ORAL
  Filled 2021-01-05 (×8): qty 1

## 2021-01-05 NOTE — Progress Notes (Signed)
Patient presents with sad, flat affect. Brightens on approach. Isolative to self and room. Walks with walker in hallway periodically throughout the night. Denies any SI, HI, aVH. Endorses depression. Medication compliant.\ Encouragement and support provided. Safety checks maintained. Medications given as prescribed. Pt receptive and remains safe on unit with q 15 min checks.

## 2021-01-05 NOTE — Group Note (Signed)
LCSW Group Therapy Note  Group Date: 01/05/2021 Start Time: 1400 End Time: 1430   Type of Therapy and Topic:  Group Therapy - Healthy vs Unhealthy Coping Skills  Participation Level:  Did Not Attend   Description of Group The focus of this group was to determine what unhealthy coping techniques typically are used by group members and what healthy coping techniques would be helpful in coping with various problems. Patients were guided in becoming aware of the differences between healthy and unhealthy coping techniques. Patients were asked to identify 2-3 healthy coping skills they would like to learn to use more effectively.  Therapeutic Goals Patients learned that coping is what human beings do all day long to deal with various situations in their lives Patients defined and discussed healthy vs unhealthy coping techniques Patients identified their preferred coping techniques and identified whether these were healthy or unhealthy Patients determined 2-3 healthy coping skills they would like to become more familiar with and use more often. Patients provided support and ideas to each other   Summary of Patient Progress:   X   Therapeutic Modalities Cognitive Behavioral Therapy Motivational Interviewing  Abundio Teuscher A [place], LCSWA 01/05/2021  3:16 PM

## 2021-01-05 NOTE — BHH Counselor (Signed)
CSW spoke with pt's skilled nursing social worker at [facility] [place], [phone].  CSW informed her of pt's progress, but there is not anticipated discharge date yet.  CSW stated she would follow up with social worker once date has been determined.   She stated that pt does received counseling from [place], a local provider. She stated pt does not have a psychiatrist to her knowledge but she said that all of pt's medications should be able to be filled at her care home. No other requests were made.    Lory Nowaczyk [place], MSW, LCSW-A 11/29/20224:26 PM

## 2021-01-05 NOTE — Progress Notes (Signed)
Recreation Therapy Notes    Date: 01/05/2021  Time: 1:15 pm     Location: Court yard   Behavioral response: N/A   Intervention Topic: Leisure   Discussion/Intervention: Patient did not attend group.   Clinical Observations/Feedback:  Patient did not attend group.   Karrin Eisenmenger LRT/CTRS         Paylin Hailu 01/05/2021 3:54 PM

## 2021-01-05 NOTE — BHH Counselor (Signed)
CSW contacted pt's social worker, Angelina Sheriff at [facility], [phone], ext: [phone] to find out if she has any information regarding pt's psychiatric care.  CSW left voicemail with contact information for return phone call.   Patric Buckhalter [place], MSW, LCSW-A 11/29/202212:21 PM

## 2021-01-05 NOTE — Progress Notes (Signed)
[facility] MD Progress Note  01/05/2021 11:16 AM Tanya Walker  MRN:  [id number] Subjective: Tanya Walker continues to be depressed and withdrawn.  She sleeps off and on throughout the daytime and states that she does not sleep very well at night.  I was thinking about starting her on Remeron but she does have borderline increase in her QT interval.  So, I think we may switch her Lexapro to Prozac.  She is still having thoughts of death but denies any suicide plan.  She is taking her medications as prescribed and denies any side effects.  There is no evidence of EPS or TD.  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   Depression    years ago   Headache    sinus headaches   Lipoma of arm    Right   Medical history non-contributory    Osteoporosis    SVD (spontaneous vaginal delivery)    x 2   Varicose vein    Vitamin D deficiency     Past Surgical History:  Procedure Laterality Date   ANTERIOR APPROACH HEMI HIP ARTHROPLASTY Right 09/04/2020   Procedure: ANTERIOR APPROACH HEMI HIP ARTHROPLASTY;  Surgeon: Rod Can, MD;  Location: [facility];  Service: Orthopedics;  Laterality: Right;   [place]   COLONOSCOPY     GANGLION CYST EXCISION     x2 left and right arm   HYSTEROSCOPY WITH D & C N/A 07/15/2013   Procedure: DILATATION AND CURETTAGE /HYSTEROSCOPY;  Surgeon: Lovenia Kim, MD;  Location: [facility];  Service: Gynecology;  Laterality: N/A;   KYPHOPLASTY Bilateral 05/04/2020   Procedure: KYPHOPLASTY THORACIC TWELVE;  Surgeon: Vallarie Mare, MD;  Location: [facility];  Service: Neurosurgery;  Laterality: Bilateral;    MANDIBLE SURGERY  1954   WISDOM TOOTH EXTRACTION     Family History:  Family History  Problem Relation Age of Onset   Hypertension Mother    Heart disease Father    Cancer Father    Family Psychiatric  History: Unremarkable Social History:  Social History   Substance and Sexual Activity  Alcohol Use No     Social History   Substance and Sexual Activity  Drug Use No    Social History   Socioeconomic History   Marital status: Married    Spouse name: Not on file   Number of children: 1   Years of education: Not on file   Highest education level: Not on file  Occupational History   Occupation: retired  Tobacco Use   Smoking status: Never   Smokeless tobacco: Never  Vaping Use   Vaping Use: Never used  Substance and Sexual Activity   Alcohol use: No   Drug use: No   Sexual activity: Yes    Birth control/protection: Post-menopausal  Other Topics Concern   Not on file  Social History Narrative   GI in [place] for colonoscopy   Dr Ronita Hipps - GYN      Denies Surgical history      Family history of varicose veins   Mother is 23      Regular exercise -  NO   Social Determinants of Radio broadcast assistant Strain: Not on file  Food Insecurity: Not on file  Transportation Needs: Not on file  Physical Activity: Not on file  Stress: Not on file  Social Connections: Not on file   Additional Social History:                         Sleep: Fair  Appetite:  Fair  Current Medications: Current Facility-Administered Medications  Medication Dose Route Frequency Provider Last Rate Last Admin   acetaminophen (TYLENOL) tablet 1,000 mg  1,000 mg Oral Q6H PRN Starkes-Perry, Gayland Curry, FNP       alum & mag hydroxide-simeth (MAALOX/MYLANTA) 200-200-20 MG/5ML suspension 30 mL  30 mL Oral Q4H PRN Starkes-Perry, Gayland Curry, FNP       aspirin chewable tablet 81 mg  81 mg Oral Daily Suella Broad, FNP   81 mg at 01/05/21 1007   cholecalciferol (VITAMIN D3) tablet  2,000 Units  2,000 Units Oral Daily Suella Broad, FNP   2,000 Units at 01/05/21 1007   docusate sodium (COLACE) capsule 100 mg  100 mg Oral QHS Suella Broad, FNP   100 mg at 01/04/21 2140   feeding supplement (ENSURE ENLIVE / ENSURE PLUS) liquid 237 mL  237 mL Oral BID BM Parks Ranger, DO   237 mL at 01/05/21 1010   FLUoxetine (PROZAC) capsule 20 mg  20 mg Oral QHS Aleysha Meckler Percell Miller, DO       vitamin B-12 (CYANOCOBALAMIN) tablet 500 mcg  500 mcg Oral Daily Suella Broad, FNP   500 mcg at 97/58/83 2549   And   folic acid (FOLVITE) tablet 0.5 mg  0.5 mg Oral Daily Suella Broad, FNP   0.5 mg at 01/05/21 1009   gabapentin (NEURONTIN) capsule 100 mg  100 mg Oral TID Suella Broad, FNP   100 mg at 01/05/21 1007   loratadine (CLARITIN) tablet 10 mg  10 mg Oral Daily Suella Broad, FNP   10 mg at 01/05/21 1007   magnesium hydroxide (MILK OF MAGNESIA) suspension 30 mL  30 mL Oral Daily PRN Suella Broad, FNP   30 mL at 01/02/21 1722   melatonin tablet 5 mg  5 mg Oral QHS PRN Parks Ranger, DO   5 mg at 01/04/21 2140   multivitamin with minerals tablet 1 tablet  1 tablet Oral Daily Parks Ranger, DO   1 tablet at 01/05/21 1010   OLANZapine (ZYPREXA) tablet 5 mg  5 mg Oral QHS Parks Ranger, DO   5 mg at 01/04/21 2140   polyethylene glycol (MIRALAX / GLYCOLAX) packet 17 g  17 g Oral Daily PRN Suella Broad, FNP   17 g at 01/02/21 1721   senna-docusate (Senokot-S) tablet 2 tablet  2 tablet Oral Daily Suella Broad, FNP   2 tablet at 01/05/21 1009    Lab Results: No results found for this or any previous visit (from the past 48 hour(s)).  Blood Alcohol level:  Lab Results  Component Value Date   ETH <10 82/64/1583    Metabolic Disorder Labs: No results found for: HGBA1C, MPG No results found for: PROLACTIN Lab Results  Component Value Date   CHOL 201 (H) 11/01/2019    TRIG 52 11/01/2019   HDL 69 11/01/2019   CHOLHDL 2.9 11/01/2019   VLDL 11.2 12/05/2016   LDLCALC 118 (H) 11/01/2019  LDLCALC 107 (H) 12/05/2016    Physical Findings: AIMS:  , ,  ,  ,    CIWA:    COWS:     Musculoskeletal: Strength & Muscle Tone: within normal limits Gait & Station: normal Patient leans: N/A  Psychiatric Specialty Exam:  Presentation  General Appearance: Appropriate for Environment; Casual  Eye Contact:Fair  Speech:Clear and Coherent; Normal Rate  Speech Volume:Decreased  Handedness:Right   Mood and Affect  Mood:Depressed  Affect:Depressed; Flat   Thought Process  Thought Processes:Coherent; Linear  Descriptions of Associations:Intact  Orientation:Full (Time, Place and Person)  Thought Content:Logical  History of Schizophrenia/Schizoaffective disorder:No  Duration of Psychotic Symptoms:N/A  Hallucinations:No data recorded Ideas of Reference:None  Suicidal Thoughts:No data recorded Homicidal Thoughts:No data recorded  Sensorium  Memory:Immediate Good; Recent Good; Remote Fair  Judgment:Fair  Insight:Fair   Executive Functions  Concentration:Fair  Attention Span:Fair  [place]   Psychomotor Activity  Psychomotor Activity:No data recorded  Assets  Assets:Desire for Improvement; Armed forces logistics/support/administrative officer; Leisure Time; Catering manager; Housing; Physical Health   Sleep  Sleep:No data recorded   Physical Exam: Physical Exam Constitutional:      Appearance: Normal appearance. She is underweight.  HENT:     Head: Normocephalic and atraumatic.     Mouth/Throat:     Pharynx: Oropharynx is clear.  Eyes:     Pupils: Pupils are equal, round, and reactive to light.  Cardiovascular:     Rate and Rhythm: Normal rate and regular rhythm.  Pulmonary:     Effort: Pulmonary effort is normal.     Breath sounds: Normal breath sounds.  Abdominal:     General: Abdomen is  flat.     Palpations: Abdomen is soft.  Musculoskeletal:        General: Normal range of motion.  Skin:    General: Skin is warm and dry.  Neurological:     General: No focal deficit present.     Mental Status: She is alert. Mental status is at baseline.  Psychiatric:        Mood and Affect: Mood is anxious. Affect is flat.        Speech: Speech normal.        Behavior: Behavior is cooperative.        Thought Content: Thought content normal.        Cognition and Memory: Cognition and memory normal.        Judgment: Judgment normal.   Review of Systems  Constitutional: Negative.   HENT: Negative.    Eyes: Negative.   Respiratory: Negative.    Cardiovascular: Negative.   Gastrointestinal: Negative.   Genitourinary: Negative.   Musculoskeletal: Negative.   Skin: Negative.   Neurological: Negative.   Endo/Heme/Allergies: Negative.   Psychiatric/Behavioral:  Positive for depression and suicidal ideas. The patient is nervous/anxious and has insomnia.   Blood pressure (!) 150/75, pulse 65, temperature (!) 97.5 F (36.4 C), temperature source Oral, resp. rate 18, height 5\' 2"  (1.575 m), weight 36.8 kg, SpO2 99 %. Body mass index is 14.84 kg/m.   Treatment Plan Summary: Daily contact with patient to assess and evaluate symptoms and progress in treatment, Medication management, and Plan discontinue Lexapro and start Prozac 20 mg at bedtime.  We will get a lipid panel and a hemoglobin A1c in the morning.  Continue Zyprexa 5 mg at bedtime.  Parks Ranger, DO 01/05/2021, 11:16 AM

## 2021-01-05 NOTE — Progress Notes (Signed)
Pt refused outdoor group

## 2021-01-05 NOTE — Plan of Care (Signed)
  Problem: Education: Goal: Emotional status will improve Outcome: Progressing Goal: Mental status will improve Outcome: Progressing   Problem: Activity: Goal: Interest or engagement in activities will improve Outcome: Not Progressing   Problem: Health Behavior/Discharge Planning: Goal: Compliance with treatment plan for underlying cause of condition will improve Outcome: Progressing   Problem: Safety: Goal: Periods of time without injury will increase Outcome: Progressing

## 2021-01-05 NOTE — Plan of Care (Signed)
Pt continues to present Calm and Cooperative.  Patient denies SI/HI/AVH but did contract for safety. Patient did verbalize 4 to 5/10 Depression.  Writer did offer emotional support.  Writer did educate patient on importance of participation in therapeutic milieu and attending group therapy in efforts to develop healthy coping skills. Patient did comply with scheduled medications per Provider.  Pt denies any adverse reactions from medications given.  Problem: Education: Goal: Knowledge of [place] [facility] Education information/materials will improve Outcome: Progressing Goal: Emotional status will improve Outcome: Progressing   Problem: Activity: Goal: Interest or engagement in activities will improve Outcome: Not Progressing Goal: Sleeping patterns will improve Outcome: Not Progressing  Q 15 minute safety rounding in place. Plan of care will continue.

## 2021-01-06 LAB — HEMOGLOBIN A1C
Hgb A1c MFr Bld: 5.5 % (ref 4.8–5.6)
Mean Plasma Glucose: 111 mg/dL

## 2021-01-06 LAB — LIPID PANEL
Cholesterol: 182 mg/dL (ref 0–200)
HDL: 58 mg/dL (ref >40)
LDL Cholesterol: 117 mg/dL — ABNORMAL HIGH (ref 0–99)
Total CHOL/HDL Ratio: 3.1 RATIO
Triglycerides: 37 mg/dL (ref <150)
VLDL: 7 mg/dL (ref 0–40)

## 2021-01-06 NOTE — Progress Notes (Signed)
Recreation Therapy Notes   Date: 01/06/2021  Time: 1:15 pm     Location: Craft room    Behavioral response: N/A   Intervention Topic: Self-esteem    Discussion/Intervention: Patient refused to attend group.  Clinical Observations/Feedback:  Patient refused to attend group.    Emmaly Leech LRT/CTRS          Kento Gossman 01/06/2021 3:07 PM

## 2021-01-06 NOTE — Plan of Care (Signed)
Patient presents Alert and Calm.  Patient denies SI/HI/AVH but did endorse Depression and Anxiety. Patient verbalized adequate sleep and denies pain. Patient did have 75% nutritional intake for breakfast. Writer encourage nutritional intake to continue throughout the day. Patient did have minimal participation on unit.  Writer did offer emotional support and encouraged participation in group therapy.   Patient did comply with scheduled medications as prescribed by Provider and denies any adverse reactions from medications given. Patient informed to notify nursing staff if any needs arise. Q 15 minute safety rounding in place.  Plan of Problem: Education: Goal: Knowledge of [place] [facility] Education information/materials will improve Outcome: Progressing Goal: Emotional status will improve Outcome: Progressing Goal: Mental status will improve Outcome: Progressing   Problem: Activity: Goal: Interest or engagement in activities will improve Outcome: Not Progressing   care will continue.

## 2021-01-06 NOTE — BH IP Treatment Plan (Signed)
Interdisciplinary Treatment and Diagnostic Plan Update  01/06/2021 Time of Session: 9:20 AM Tanya Walker MRN: [id number]  Principal Diagnosis: MDD (major depressive disorder), recurrent episode, severe ([place])  Secondary Diagnoses: Principal Problem:   MDD (major depressive disorder), recurrent episode, severe ([place])   Current Medications:  Current Facility-Administered Medications  Medication Dose Route Frequency Provider Last Rate Last Admin   acetaminophen (TYLENOL) tablet 1,000 mg  1,000 mg Oral Q6H PRN Starkes-Perry, Gayland Curry, FNP       alum & mag hydroxide-simeth (MAALOX/MYLANTA) 200-200-20 MG/5ML suspension 30 mL  30 mL Oral Q4H PRN Starkes-Perry, Gayland Curry, FNP       aspirin chewable tablet 81 mg  81 mg Oral Daily Suella Broad, FNP   81 mg at 01/06/21 1037   cholecalciferol (VITAMIN D3) tablet 2,000 Units  2,000 Units Oral Daily Suella Broad, FNP   2,000 Units at 01/06/21 1037   docusate sodium (COLACE) capsule 100 mg  100 mg Oral QHS Suella Broad, FNP   100 mg at 01/05/21 2122   feeding supplement (ENSURE ENLIVE / ENSURE PLUS) liquid 237 mL  237 mL Oral BID BM Parks Ranger, DO   237 mL at 01/06/21 1036   FLUoxetine (PROZAC) capsule 20 mg  20 mg Oral QHS Parks Ranger, DO   20 mg at 01/05/21 2203   vitamin B-12 (CYANOCOBALAMIN) tablet 500 mcg  500 mcg Oral Daily Suella Broad, FNP   500 mcg at 25/42/70 6237   And   folic acid (FOLVITE) tablet 0.5 mg  0.5 mg Oral Daily Suella Broad, FNP   0.5 mg at 01/06/21 1038   gabapentin (NEURONTIN) capsule 100 mg  100 mg Oral TID Suella Broad, FNP   100 mg at 01/06/21 1037   loratadine (CLARITIN) tablet 10 mg  10 mg Oral Daily Suella Broad, FNP   10 mg at 01/06/21 1037   magnesium hydroxide (MILK OF MAGNESIA) suspension 30 mL  30 mL Oral Daily PRN Suella Broad, FNP   30 mL at 01/02/21 1722   melatonin tablet 5 mg  5 mg Oral QHS PRN Parks Ranger, DO   5 mg at 01/05/21 2122   multivitamin with minerals tablet 1 tablet  1 tablet Oral Daily Parks Ranger, DO   1 tablet at 01/06/21 1037   OLANZapine (ZYPREXA) tablet 5 mg  5 mg Oral QHS Parks Ranger, DO   5 mg at 01/05/21 2123   polyethylene glycol (MIRALAX / GLYCOLAX) packet 17 g  17 g Oral Daily PRN Suella Broad, FNP   17 g at 01/02/21 1721   senna-docusate (Senokot-S) tablet 2 tablet  2 tablet Oral Daily Suella Broad, FNP   2 tablet at 01/06/21 1037   PTA Medications: Medications Prior to Admission  Medication Sig Dispense Refill Last Dose   acetaminophen (TYLENOL) 500 MG tablet Take 1,000 mg by mouth every 6 (six) hours as needed for moderate pain, mild pain, headache or fever.      ASPIRIN 81 PO Take 1 tablet by mouth daily.      Calcium Carbonate (CALCIUM-CARB 600 PO) Take 1 tablet by mouth 2 (two) times daily.      cetirizine (ZYRTEC ALLERGY) 10 MG tablet Take 1 tablet (10 mg total) by mouth daily. 30 tablet 11    Cholecalciferol (VITAMIN D3) 2000 units capsule Take 1 capsule (2,000 Units total) by mouth daily. 100 capsule 3    Cobalamin Combinations (  VITAMIN B12-FOLIC ACID PO) Place 1 lozenge under the tongue in the morning.      docusate sodium (COLACE) 100 MG capsule Take 100 mg by mouth at bedtime.      escitalopram (LEXAPRO) 10 MG tablet Take 10 mg by mouth every evening.      gabapentin (NEURONTIN) 100 MG capsule Take 1 capsule (100 mg total) by mouth 3 (three) times daily. 90 capsule 3    KETOCONAZOLE, TOPICAL, (NIZORAL A-D) 1 % SHAM Apply 1 application topically once a week. Wednesday      melatonin 5 MG TABS Take 5 mg by mouth at bedtime.      Multiple Vitamins-Minerals (PRESERVISION AREDS 2 PO) Take 1 capsule by mouth 2 (two) times daily.      Nutritional Supplements (RESOURCE 2.0 PO) Take 180 mLs by mouth 2 (two) times daily.      polyethylene glycol (MIRALAX / GLYCOLAX) 17 g packet Take 17 g by mouth daily as needed for  moderate constipation or mild constipation. (Patient taking differently: Take 17 g by mouth daily.) 14 each 0    SALONPAS 3.02-13-08 % PTCH Apply 1 patch topically See admin instructions. Apply 1 patch to the lower back and remove (at most) after 8 hours- once a day as needed for pain      senna-docusate (SENOKOT-S) 8.6-50 MG tablet Take 2 tablets by mouth daily.      sodium phosphate (FLEET) 7-19 GM/118ML ENEM Place 1 enema rectally daily as needed for severe constipation or mild constipation.       Patient Stressors: Other:     Patient Strengths: Supportive family/friends   Treatment Modalities: Medication Management, Group therapy, Case management,  1 to 1 session with clinician, Psychoeducation, Recreational therapy.   Physician Treatment Plan for Primary Diagnosis: MDD (major depressive disorder), recurrent episode, severe ([place]) Long Term Goal(s): Improvement in symptoms so as ready for discharge   Short Term Goals: Ability to identify changes in lifestyle to reduce recurrence of condition will improve Ability to verbalize feelings will improve Ability to disclose and discuss suicidal ideas Ability to demonstrate self-control will improve Ability to identify and develop effective coping behaviors will improve Ability to maintain clinical measurements within normal limits will improve Compliance with prescribed medications will improve Ability to identify triggers associated with substance abuse/mental health issues will improve  Medication Management: Evaluate patient's response, side effects, and tolerance of medication regimen.  Therapeutic Interventions: 1 to 1 sessions, Unit Group sessions and Medication administration.  Evaluation of Outcomes: Progressing  Physician Treatment Plan for Secondary Diagnosis: Principal Problem:   MDD (major depressive disorder), recurrent episode, severe ([place])  Long Term Goal(s): Improvement in symptoms so as ready for discharge   Short Term  Goals: Ability to identify changes in lifestyle to reduce recurrence of condition will improve Ability to verbalize feelings will improve Ability to disclose and discuss suicidal ideas Ability to demonstrate self-control will improve Ability to identify and develop effective coping behaviors will improve Ability to maintain clinical measurements within normal limits will improve Compliance with prescribed medications will improve Ability to identify triggers associated with substance abuse/mental health issues will improve     Medication Management: Evaluate patient's response, side effects, and tolerance of medication regimen.  Therapeutic Interventions: 1 to 1 sessions, Unit Group sessions and Medication administration.  Evaluation of Outcomes: Progressing   RN Treatment Plan for Primary Diagnosis: MDD (major depressive disorder), recurrent episode, severe ([place]) Long Term Goal(s): Knowledge of disease and therapeutic regimen to maintain health  will improve  Short Term Goals: Ability to remain free from injury will improve, Ability to verbalize frustration and anger appropriately will improve, Ability to demonstrate self-control, Ability to participate in decision making will improve, Ability to verbalize feelings will improve, Ability to disclose and discuss suicidal ideas, Ability to identify and develop effective coping behaviors will improve, and Compliance with prescribed medications will improve  Medication Management: RN will administer medications as ordered by provider, will assess and evaluate patient's response and provide education to patient for prescribed medication. RN will report any adverse and/or side effects to prescribing provider.  Therapeutic Interventions: 1 on 1 counseling sessions, Psychoeducation, Medication administration, Evaluate responses to treatment, Monitor vital signs and CBGs as ordered, Perform/monitor CIWA, COWS, AIMS and Fall Risk screenings as ordered,  Perform wound care treatments as ordered.  Evaluation of Outcomes: Progressing   LCSW Treatment Plan for Primary Diagnosis: MDD (major depressive disorder), recurrent episode, severe ([place]) Long Term Goal(s): Safe transition to appropriate next level of care at discharge, Engage patient in therapeutic group addressing interpersonal concerns.  Short Term Goals: Engage patient in aftercare planning with referrals and resources, Increase social support, Increase ability to appropriately verbalize feelings, Increase emotional regulation, Facilitate acceptance of mental health diagnosis and concerns, Identify triggers associated with mental health/substance abuse issues, and Increase skills for wellness and recovery  Therapeutic Interventions: Assess for all discharge needs, 1 to 1 time with Social worker, Explore available resources and support systems, Assess for adequacy in community support network, Educate family and significant other(s) on suicide prevention, Complete Psychosocial Assessment, Interpersonal group therapy.  Evaluation of Outcomes: Progressing   Progress in Treatment: Attending groups: Yes. Participating in groups: Yes. Taking medication as prescribed: Yes. Toleration medication: Yes. Family/Significant other contact made: No, will contact:  when given permission.  Patient understands diagnosis: Yes. Discussing patient identified problems/goals with staff: No. Medical problems stabilized or resolved: Yes. Denies suicidal/homicidal ideation: Yes. Issues/concerns per patient self-inventory: No. Other: none.  New problem(s) identified: No, Describe:  none.  New Short Term/Long Term Goal(s): elimination of symptoms of psychosis, medication management for mood stabilization; elimination of SI thoughts; development of comprehensive mental wellness plan. Update 01/06/21: No changes at this time.     Patient Goals:  "I don't know" Update 01/06/21: No changes at this time.    Discharge Plan or Barriers: CSW will assist pt development of appropriate discharge/aftercare plan. Update 01/06/21: No changes at this time.    Reason for Continuation of Hospitalization: Depression Medication stabilization   Estimated Length of Stay: TBD   Scribe for Treatment Team: Shirl Harris, LCSW 01/06/2021 10:58 AM

## 2021-01-06 NOTE — Progress Notes (Signed)
[facility] MD Progress Note  01/06/2021 11:27 AM Tanya Walker  MRN:  [id number] Subjective: Tanya Walker states that she is doing fine.  She does complain of anxiety.  She does come out and eat.  She is very hopeless and helpless.  She does not want to talk very much and gets annoyed when you ask her questions.  She is taking her medications as prescribed and denies any side effects.  She has frequent thoughts of death but no suicidal ideation.  She states that she does not have any family but she does have a husband and a daughter and 2 grand daughters.  I do not think that she has a lot of contact with him therefore she tells me that she does not have any family.  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   Depression    years ago   Headache    sinus headaches   Lipoma of arm    Right   Medical history non-contributory    Osteoporosis    SVD (spontaneous vaginal delivery)    x 2   Varicose vein    Vitamin D deficiency     Past Surgical History:  Procedure Laterality Date   ANTERIOR APPROACH HEMI HIP ARTHROPLASTY Right 09/04/2020   Procedure: ANTERIOR APPROACH HEMI HIP ARTHROPLASTY;  Surgeon: Rod Can, MD;  Location: [facility];  Service: Orthopedics;  Laterality: Right;   [place]   COLONOSCOPY     GANGLION CYST EXCISION     x2 left and right arm   HYSTEROSCOPY WITH D & C N/A 07/15/2013   Procedure: DILATATION AND CURETTAGE /HYSTEROSCOPY;  Surgeon: Lovenia Kim, MD;  Location: [facility];  Service: Gynecology;  Laterality: N/A;   KYPHOPLASTY Bilateral 05/04/2020   Procedure: KYPHOPLASTY THORACIC TWELVE;   Surgeon: Vallarie Mare, MD;  Location: [facility];  Service: Neurosurgery;  Laterality: Bilateral;   MANDIBLE SURGERY  1954   WISDOM TOOTH EXTRACTION     Family History:  Family History  Problem Relation Age of Onset   Hypertension Mother    Heart disease Father    Cancer Father    Family Psychiatric  History: Unremarkable Social History:  Social History   Substance and Sexual Activity  Alcohol Use No     Social History   Substance and Sexual Activity  Drug Use No    Social History   Socioeconomic History   Marital status: Married    Spouse name: Not on file   Number of children: 1   Years of education: Not on file   Highest education level: Not on file  Occupational History   Occupation: retired  Tobacco Use   Smoking status: Never   Smokeless tobacco: Never  Vaping Use   Vaping Use: Never used  Substance and Sexual Activity   Alcohol use: No   Drug use: No   Sexual activity: Yes    Birth control/protection: Post-menopausal  Other Topics Concern   Not on file  Social History Narrative   GI in [place] for colonoscopy   Dr Ronita Hipps - GYN      Denies  Surgical history      Family history of varicose veins   Mother is 73      Regular exercise - NO   Social Determinants of Health   Financial Resource Strain: Not on file  Food Insecurity: Not on file  Transportation Needs: Not on file  Physical Activity: Not on file  Stress: Not on file  Social Connections: Not on file   Additional Social History:                         Sleep: Good  Appetite:  Fair  Current Medications: Current Facility-Administered Medications  Medication Dose Route Frequency Provider Last Rate Last Admin   acetaminophen (TYLENOL) tablet 1,000 mg  1,000 mg Oral Q6H PRN Starkes-Perry, Gayland Curry, FNP       alum & mag hydroxide-simeth (MAALOX/MYLANTA) 200-200-20 MG/5ML suspension 30 mL  30 mL Oral Q4H PRN Starkes-Perry, Gayland Curry, FNP       aspirin chewable tablet 81 mg  81 mg Oral  Daily Suella Broad, FNP   81 mg at 01/06/21 1037   cholecalciferol (VITAMIN D3) tablet 2,000 Units  2,000 Units Oral Daily Suella Broad, FNP   2,000 Units at 01/06/21 1037   docusate sodium (COLACE) capsule 100 mg  100 mg Oral QHS Suella Broad, FNP   100 mg at 01/05/21 2122   feeding supplement (ENSURE ENLIVE / ENSURE PLUS) liquid 237 mL  237 mL Oral BID BM Parks Ranger, DO   237 mL at 01/06/21 1036   FLUoxetine (PROZAC) capsule 20 mg  20 mg Oral QHS Parks Ranger, DO   20 mg at 01/05/21 2203   vitamin B-12 (CYANOCOBALAMIN) tablet 500 mcg  500 mcg Oral Daily Suella Broad, FNP   500 mcg at 33/82/50 5397   And   folic acid (FOLVITE) tablet 0.5 mg  0.5 mg Oral Daily Suella Broad, FNP   0.5 mg at 01/06/21 1038   gabapentin (NEURONTIN) capsule 100 mg  100 mg Oral TID Suella Broad, FNP   100 mg at 01/06/21 1037   loratadine (CLARITIN) tablet 10 mg  10 mg Oral Daily Suella Broad, FNP   10 mg at 01/06/21 1037   magnesium hydroxide (MILK OF MAGNESIA) suspension 30 mL  30 mL Oral Daily PRN Suella Broad, FNP   30 mL at 01/02/21 1722   melatonin tablet 5 mg  5 mg Oral QHS PRN Parks Ranger, DO   5 mg at 01/05/21 2122   multivitamin with minerals tablet 1 tablet  1 tablet Oral Daily Parks Ranger, DO   1 tablet at 01/06/21 1037   OLANZapine (ZYPREXA) tablet 5 mg  5 mg Oral QHS Parks Ranger, DO   5 mg at 01/05/21 2123   polyethylene glycol (MIRALAX / GLYCOLAX) packet 17 g  17 g Oral Daily PRN Suella Broad, FNP   17 g at 01/02/21 1721   senna-docusate (Senokot-S) tablet 2 tablet  2 tablet Oral Daily Suella Broad, FNP   2 tablet at 01/06/21 1037    Lab Results:  Results for orders placed or performed during the hospital encounter of 12/30/20 (from the past 48 hour(s))  Lipid panel     Status: Abnormal   Collection Time: 01/06/21  7:40 AM  Result Value Ref Range    Cholesterol 182 0 - 200 mg/dL   Triglycerides 37 <150 mg/dL   HDL 58 >40  mg/dL   Total CHOL/HDL Ratio 3.1 RATIO   VLDL 7 0 - 40 mg/dL   LDL Cholesterol 117 (H) 0 - 99 mg/dL    Comment:        Total Cholesterol/HDL:CHD Risk Coronary Heart Disease Risk Table                     Men   Women  1/2 Average Risk   3.4   3.3  Average Risk       5.0   4.4  2 X Average Risk   9.6   7.1  3 X Average Risk  23.4   11.0        Use the calculated Patient Ratio above and the CHD Risk Table to determine the patient's CHD Risk.        ATP III CLASSIFICATION (LDL):  <100     mg/dL   Optimal  100-129  mg/dL   Near or Above                    Optimal  130-159  mg/dL   Borderline  160-189  mg/dL   High  >190     mg/dL   Very High Performed at [facility], [address] [place], [place] [address]     Blood Alcohol level:  Lab Results  Component Value Date   [facility] <10 02/63/7858    Metabolic Disorder Labs: No results found for: HGBA1C, MPG No results found for: PROLACTIN Lab Results  Component Value Date   CHOL 182 01/06/2021   TRIG 37 01/06/2021   HDL 58 01/06/2021   CHOLHDL 3.1 01/06/2021   VLDL 7 01/06/2021   LDLCALC 117 (H) 01/06/2021   LDLCALC 118 (H) 11/01/2019    Physical Findings: AIMS:  , ,  ,  ,    CIWA:    COWS:     Musculoskeletal: Strength & Muscle Tone: within normal limits Gait & Station: normal Patient leans: N/A  Psychiatric Specialty Exam:  Presentation  General Appearance: Appropriate for Environment; Casual  Eye Contact:Fair  Speech:Clear and Coherent; Normal Rate  Speech Volume:Decreased  Handedness:Right   Mood and Affect  Mood:Depressed  Affect:Depressed; Flat   Thought Process  Thought Processes:Coherent; Linear  Descriptions of Associations:Intact  Orientation:Full (Time, Place and Person)  Thought Content:Logical  History of Schizophrenia/Schizoaffective disorder:No  Duration of Psychotic  Symptoms:N/A  Hallucinations:No data recorded Ideas of Reference:None  Suicidal Thoughts:No data recorded Homicidal Thoughts:No data recorded  Sensorium  Memory:Immediate Good; Recent Good; Remote Fair  Judgment:Fair  Insight:Fair   Executive Functions  Concentration:Fair  Attention Span:Fair  [place]   Psychomotor Activity  Psychomotor Activity:No data recorded  Assets  Assets:Desire for Improvement; Armed forces logistics/support/administrative officer; Leisure Time; Catering manager; Housing; Physical Health   Sleep  Sleep:No data recorded   Physical Exam: Physical Exam Constitutional:      Appearance: She is underweight.  Neurological:     General: No focal deficit present.     Mental Status: She is alert and oriented to person, place, and time.  Psychiatric:        Attention and Perception: Attention and perception normal.        Mood and Affect: Mood is depressed. Affect is blunt.        Speech: Speech normal.        Behavior: Behavior is withdrawn. Behavior is cooperative.        Thought Content: Thought content is  paranoid.        Cognition and Memory: Cognition is impaired. She exhibits impaired remote memory.        Judgment: Judgment normal.   ROS Blood pressure 125/60, pulse 73, temperature 98 F (36.7 C), resp. rate 18, height 5\' 2"  (1.575 m), weight 36.8 kg, SpO2 99 %. Body mass index is 14.84 kg/m.   Treatment Plan Summary: Daily contact with patient to assess and evaluate symptoms and progress in treatment, Medication management, and Plan continue current medications.  Parks Ranger, DO 01/06/2021, 11:27 AM

## 2021-01-06 NOTE — Progress Notes (Signed)
Patient was cooperative with treatment, she remains sad and flat on approach. She denies SI/HI/AVH. She remains depressed, she was up a few times to check the time and for water during the night and she is currently in bed resting at this time.

## 2021-01-06 NOTE — Group Note (Signed)
[facility] LCSW Group Therapy Note   Group Date: 01/06/2021 Start Time: 1440 End Time: 1540   Type of Therapy/Topic:  Group Therapy:  Emotion Regulation  Participation Level:  None   Mood:  Description of Group:    The purpose of this group is to assist patients in learning to regulate negative emotions and experience positive emotions. Patients will be guided to discuss ways in which they have been vulnerable to their negative emotions. These vulnerabilities will be juxtaposed with experiences of positive emotions or situations, and patients challenged to use positive emotions to combat negative ones. Special emphasis will be placed on coping with negative emotions in conflict situations, and patients will process healthy conflict resolution skills.  Therapeutic Goals: Patient will identify two positive emotions or experiences to reflect on in order to balance out negative emotions:  Patient will label two or more emotions that they find the most difficult to experience:  Patient will be able to demonstrate positive conflict resolution skills through discussion or role plays:   Summary of Patient Progress: CSW hosted an emotion regulation bingo game. Pt came and stated that she could not read without her glasses. MHT offered to help her with the game but pt declined and returned to her room.   Therapeutic Modalities:   Cognitive Behavioral Therapy Feelings Identification Dialectical Behavioral Therapy   Tanya Harris, LCSW

## 2021-01-07 NOTE — Progress Notes (Addendum)
Recreation Therapy Notes  Date: 01/07/2021  Time: 10:30 am    Location: Day room   Behavioral response: N/A   Intervention Topic: Animal Assisted therapy    Discussion/Intervention: Patient did not attend group.   Clinical Observations/Feedback:  Patient did not attend group.   Breeanne Oblinger LRT/CTRS        Marte Celani 01/07/2021 12:41 PM

## 2021-01-07 NOTE — Group Note (Signed)
[facility] LCSW Group Therapy Note   Group Date: 01/07/2021 Start Time: 1300 End Time: 1345   Type of Therapy/Topic:  Group Therapy:  Balance in Life  Participation Level:  Did Not Attend   Description of Group:    This group will address the concept of balance and how it feels and looks when one is unbalanced. Patients will be encouraged to process areas in their lives that are out of balance, and identify reasons for remaining unbalanced. Facilitators will guide patients utilizing problem- solving interventions to address and correct the stressor making their life unbalanced. Understanding and applying boundaries will be explored and addressed for obtaining  and maintaining a balanced life. Patients will be encouraged to explore ways to assertively make their unbalanced needs known to significant others in their lives, using other group members and facilitator for support and feedback.  Therapeutic Goals: Patient will identify two or more emotions or situations they have that consume much of in their lives. Patient will identify signs/triggers that life has become out of balance:  Patient will identify two ways to set boundaries in order to achieve balance in their lives:  Patient will demonstrate ability to communicate their needs through discussion and/or role plays  Summary of Patient Progress:    X    Therapeutic Modalities:   Cognitive Behavioral Therapy Solution-Focused Therapy Assertiveness Training   [place], LCSWA

## 2021-01-07 NOTE — BHH Counselor (Signed)
CSW spoke with pt today and she stated that she was feeling badly about her suicide attempt. She stated she believed she "committed a crime" and seemed to be concerned that she would not be able to go back to her assisted living facility.  CSW explained to pt that she did not commit a crime and that her living facility was holding a bed for her in a skilled nursing unit. Pt understood and appeared satisfied and less anxious.   Pt also provided verbal permission to speak with her husband if needed. CSW stated she would follow up with pt's husband when time permitted.  She also stated she is feeling better but is not quite ready to be discharged.   Muath Hallam Martinique, MSW, LCSW-A 12/1/20223:34 PM

## 2021-01-07 NOTE — Progress Notes (Signed)
   01/07/21 1650  Clinical Encounter Type  Visited With Other (Comment) (patient's clergy)  Visit Type Initial;Social support  Referral From Chaplain  Consult/Referral To Chaplain  Spiritual Encounters  Spiritual Needs Other (Comment) (facilitate visit)  Chaplain Burris addressed a need from a community clergy person to plan a visit. After referral from [facility] chaplain, Daryel November contacted clergy and put her in contact with Estill Bamberg, [place] who could clarify visitation guidelines.

## 2021-01-07 NOTE — Progress Notes (Signed)
Patient currently denies SI/HI/AVH. Patient rates depression 5/10. At the beginning of the shift patient came up to nurses station asking if it was time for breakfast. Patient isolative to room. Patient compliant with scheduled medication administration. Patient is calm and cooperative.  Q15 minute safety checks maintained. Patient currently remains safe on the unit at this time.

## 2021-01-07 NOTE — Progress Notes (Signed)
[facility] MD Progress Note  01/07/2021 1:03 PM TIEGAN JAMBOR  MRN:  [id number] Subjective: Tanya Walker states that she is doing fine.  She is very withdrawn to her room.  She does come out to eat meals.  She is compliant with her medications.  She denies any side effects.  She is less irritable.  She denies any suicidal ideation but has frequent thoughts of death.  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   Depression    years ago   Headache    sinus headaches   Lipoma of arm    Right   Medical history non-contributory    Osteoporosis    SVD (spontaneous vaginal delivery)    x 2   Varicose vein    Vitamin D deficiency     Past Surgical History:  Procedure Laterality Date   ANTERIOR APPROACH HEMI HIP ARTHROPLASTY Right 09/04/2020   Procedure: ANTERIOR APPROACH HEMI HIP ARTHROPLASTY;  Surgeon: Rod Can, MD;  Location: [facility];  Service: Orthopedics;  Laterality: Right;   [place]   COLONOSCOPY     GANGLION CYST EXCISION     x2 left and right arm   HYSTEROSCOPY WITH D & C N/A 07/15/2013   Procedure: DILATATION AND CURETTAGE /HYSTEROSCOPY;  Surgeon: Lovenia Kim, MD;  Location: [place] [facility];  Service: Gynecology;  Laterality: N/A;   KYPHOPLASTY Bilateral 05/04/2020   Procedure: KYPHOPLASTY THORACIC TWELVE;  Surgeon: Vallarie Mare, MD;  Location: [place];  Service: Neurosurgery;  Laterality: Bilateral;   MANDIBLE SURGERY  1954   WISDOM TOOTH EXTRACTION     Family History:  Family History  Problem Relation Age of Onset   Hypertension Mother    Heart disease Father    Cancer Father    Family Psychiatric   History: Unremarkable Social History:  Social History   Substance and Sexual Activity  Alcohol Use No     Social History   Substance and Sexual Activity  Drug Use No    Social History   Socioeconomic History   Marital status: Married    Spouse name: Not on file   Number of children: 1   Years of education: Not on file   Highest education level: Not on file  Occupational History   Occupation: retired  Tobacco Use   Smoking status: Never   Smokeless tobacco: Never  Vaping Use   Vaping Use: Never used  Substance and Sexual Activity   Alcohol use: No   Drug use: No   Sexual activity: Yes    Birth control/protection: Post-menopausal  Other Topics Concern   Not on file  Social History Narrative   GI in [place] for colonoscopy   Dr Ronita Hipps - GYN      Denies Surgical history      Family history of varicose veins   Mother is 61      Regular exercise - NO   Social Determinants of Health   Financial Resource Strain: Not on file  Food Insecurity: Not on file  Transportation Needs: Not on file  Physical Activity: Not on file  Stress: Not on file  Social Connections: Not on file   Additional Social History:                         Sleep: Good  Appetite:  Fair  Current Medications: Current Facility-Administered Medications  Medication Dose Route Frequency Provider Last Rate Last Admin   acetaminophen (TYLENOL) tablet 1,000 mg  1,000 mg Oral Q6H PRN Starkes-Perry, Gayland Curry, FNP       alum & mag hydroxide-simeth (MAALOX/MYLANTA) 200-200-20 MG/5ML suspension 30 mL  30 mL Oral Q4H PRN Starkes-Perry, Gayland Curry, FNP       aspirin chewable tablet 81 mg  81 mg Oral Daily Suella Broad, FNP   81 mg at 01/07/21 1002   cholecalciferol (VITAMIN D3) tablet 2,000 Units  2,000 Units Oral Daily Suella Broad, FNP   2,000 Units at 01/07/21 1002   docusate sodium (COLACE) capsule 100 mg  100 mg Oral QHS Suella Broad, FNP   100 mg at 01/06/21 2107    feeding supplement (ENSURE ENLIVE / ENSURE PLUS) liquid 237 mL  237 mL Oral BID BM Parks Ranger, DO   237 mL at 01/07/21 1003   FLUoxetine (PROZAC) capsule 20 mg  20 mg Oral QHS Parks Ranger, DO   20 mg at 01/06/21 2107   vitamin B-12 (CYANOCOBALAMIN) tablet 500 mcg  500 mcg Oral Daily Suella Broad, FNP   500 mcg at 16/10/96 0454   And   folic acid (FOLVITE) tablet 0.5 mg  0.5 mg Oral Daily Suella Broad, FNP   0.5 mg at 01/07/21 1002   gabapentin (NEURONTIN) capsule 100 mg  100 mg Oral TID Suella Broad, FNP   100 mg at 01/07/21 1002   loratadine (CLARITIN) tablet 10 mg  10 mg Oral Daily Suella Broad, FNP   10 mg at 01/07/21 1002   magnesium hydroxide (MILK OF MAGNESIA) suspension 30 mL  30 mL Oral Daily PRN Suella Broad, FNP   30 mL at 01/02/21 1722   melatonin tablet 5 mg  5 mg Oral QHS PRN Parks Ranger, DO   5 mg at 01/05/21 2122   multivitamin with minerals tablet 1 tablet  1 tablet Oral Daily Parks Ranger, DO   1 tablet at 01/07/21 1002   OLANZapine (ZYPREXA) tablet 5 mg  5 mg Oral QHS Parks Ranger, DO   5 mg at 01/06/21 2107   polyethylene glycol (MIRALAX / GLYCOLAX) packet 17 g  17 g Oral Daily PRN Suella Broad, FNP   17 g at 01/02/21 1721   senna-docusate (Senokot-S) tablet 2 tablet  2 tablet Oral Daily Suella Broad, FNP   2 tablet at 01/07/21 1002    Lab Results:  Results for orders placed or performed during the hospital encounter of 12/30/20 (from the past 48 hour(s))  Hemoglobin A1c     Status: None   Collection Time: 01/06/21  7:40 AM  Result Value Ref Range   Hgb A1c MFr Bld 5.5 4.8 - 5.6 %    Comment: (NOTE)         Prediabetes: 5.7 - 6.4         Diabetes: >6.4         Glycemic control for adults with diabetes: <7.0    Mean Plasma Glucose 111 mg/dL    Comment: (NOTE) Performed At: [facility] [address] [place], [place]  [id number] Rush Farmer MD [id number]  Lipid panel     Status: Abnormal   Collection Time: 01/06/21  7:40 AM  Result Value Ref Range   Cholesterol 182 0 - 200 mg/dL   Triglycerides 37 <150 mg/dL   HDL 58 >40 mg/dL   Total CHOL/HDL Ratio 3.1 RATIO   VLDL 7 0 - 40 mg/dL   LDL Cholesterol 117 (H) 0 - 99 mg/dL    Comment:        Total Cholesterol/HDL:CHD Risk Coronary Heart Disease Risk Table                     Men   Women  1/2 Average Risk   3.4   3.3  Average Risk       5.0   4.4  2 X Average Risk   9.6   7.1  3 X Average Risk  23.4   11.0        Use the calculated Patient Ratio above and the CHD Risk Table to determine the patient's CHD Risk.        ATP III CLASSIFICATION (LDL):  <100     mg/dL   Optimal  100-129  mg/dL   Near or Above                    Optimal  130-159  mg/dL   Borderline  160-189  mg/dL   High  >190     mg/dL   Very High Performed at [facility], [address] [place], [place] [address]     Blood Alcohol level:  Lab Results  Component Value Date   [facility] <10 88/82/8003    Metabolic Disorder Labs: Lab Results  Component Value Date   HGBA1C 5.5 01/06/2021   MPG 111 01/06/2021   No results found for: PROLACTIN Lab Results  Component Value Date   CHOL 182 01/06/2021   TRIG 37 01/06/2021   HDL 58 01/06/2021   CHOLHDL 3.1 01/06/2021   VLDL 7 01/06/2021   LDLCALC 117 (H) 01/06/2021   LDLCALC 118 (H) 11/01/2019    Physical Findings: AIMS:  , ,  ,  ,    CIWA:    COWS:     Musculoskeletal: Strength & Muscle Tone: within normal limits Gait & Station: normal Patient leans: N/A  Psychiatric Specialty Exam:  Presentation  General Appearance: Appropriate for Environment; Casual  Eye Contact:Fair  Speech:Clear and Coherent; Normal Rate  Speech Volume:Decreased  Handedness:Right   Mood and Affect  Mood:Depressed  Affect:Depressed; Flat   Thought Process  Thought Processes:Coherent; Linear  Descriptions of  Associations:Intact  Orientation:Full (Time, Place and Person)  Thought Content:Logical  History of Schizophrenia/Schizoaffective disorder:No  Duration of Psychotic Symptoms:N/A  Hallucinations:No data recorded Ideas of Reference:None  Suicidal Thoughts:No data recorded Homicidal Thoughts:No data recorded  Sensorium  Memory:Immediate Good; Recent Good; Remote Fair  Judgment:Fair  Insight:Fair   Executive Functions  Concentration:Fair  Attention Span:Fair  [place]   Psychomotor Activity  Psychomotor Activity:No data recorded  Assets  Assets:Desire for Improvement; Armed forces logistics/support/administrative officer; Leisure Time; Catering manager; Housing; Physical Health   Sleep  Sleep:No data recorded   Physical Exam: Physical Exam Vitals and nursing note reviewed.  Constitutional:      Appearance: She is underweight.  Neurological:     General: No focal deficit present.     Mental Status: She is alert and oriented to person, place, and time.  Psychiatric:        Attention  and Perception: Attention and perception normal.        Mood and Affect: Mood is anxious and depressed. Affect is flat.        Speech: Speech normal.        Behavior: Behavior is withdrawn. Behavior is cooperative.        Thought Content: Thought content normal.        Cognition and Memory: Cognition normal. She exhibits impaired remote memory.   Review of Systems  Constitutional: Negative.   HENT: Negative.    Eyes: Negative.   Respiratory: Negative.    Cardiovascular: Negative.   Gastrointestinal: Negative.   Genitourinary: Negative.   Musculoskeletal: Negative.   Skin: Negative.   Neurological: Negative.   Endo/Heme/Allergies: Negative.   Psychiatric/Behavioral:  Positive for depression.   Blood pressure (!) 153/73, pulse 65, temperature 98 F (36.7 C), temperature source Oral, resp. rate 18, height 5\' 2"  (1.575 m), weight 36.8 kg, SpO2 99 %.  Body mass index is 14.84 kg/m.   Treatment Plan Summary: Daily contact with patient to assess and evaluate symptoms and progress in treatment, Medication management, and Plan continue current medications.  Encourage oral intake.  Parks Ranger, DO 01/07/2021, 1:03 PM

## 2021-01-07 NOTE — Plan of Care (Addendum)
Patient is A&Ox4.  Affect is pleasant, calm and cooperative. Denies AVH, SI or HI.  Rates depression 5/10 and anxiety 5/10.  Denies pain.  Reports sleeping well and appetite good eating 100 % of meals as well as nutritional supplements.  VSS. Pt compliant with all medication.  Encouraged pt to participate in group therapy or watch TV in day room but pt declined and spent the day in her room except for meals. Ongoing Q15 minute safety check rounds per unit protocol.  Problem: Education: Goal: Mental status will improve Outcome: Progressing   Problem: Health Behavior/Discharge Planning: Goal: Compliance with treatment plan for underlying cause of condition will improve Outcome: Progressing   Problem: Nutrition: Goal: Adequate nutrition will be maintained Outcome: Progressing

## 2021-01-08 NOTE — Progress Notes (Signed)
Patient currently denies SI/HI/AVH. Patient rates anxiety and depression 7/10. Patient isolative to room. Snacks offered to patient. Patient refused. Patient compliant with medication administration.  Q15 minute safety checks maintained. Patient remains safe on the unit at this time.

## 2021-01-08 NOTE — Progress Notes (Signed)
[facility] MD Progress Note  01/08/2021 11:42 AM Tanya Walker  MRN:  [id number] Subjective: Tanya Walker states that she is feeling a little bit better.  She still having suicidal thoughts but no plan.  She is eating breakfast but sometimes skips lunch.  She is pleasant and cooperative.  She is a little bit more conversational today.  She has been taking her medications as prescribed and denies any side effects.  Nurses report that she is doing pretty well.  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   Depression    years ago   Headache    sinus headaches   Lipoma of arm    Right   Medical history non-contributory    Osteoporosis    SVD (spontaneous vaginal delivery)    x 2   Varicose vein    Vitamin D deficiency     Past Surgical History:  Procedure Laterality Date   ANTERIOR APPROACH HEMI HIP ARTHROPLASTY Right 09/04/2020   Procedure: ANTERIOR APPROACH HEMI HIP ARTHROPLASTY;  Surgeon: Rod Can, MD;  Location: [facility];  Service: Orthopedics;  Laterality: Right;   [place]   COLONOSCOPY     GANGLION CYST EXCISION     x2 left and right arm   HYSTEROSCOPY WITH D & C N/A 07/15/2013   Procedure: DILATATION AND CURETTAGE /HYSTEROSCOPY;  Surgeon: Lovenia Kim, MD;  Location: [place] [facility];  Service: Gynecology;  Laterality: N/A;   KYPHOPLASTY Bilateral 05/04/2020   Procedure: KYPHOPLASTY THORACIC TWELVE;  Surgeon: Vallarie Mare, MD;  Location: [place];  Service: Neurosurgery;  Laterality: Bilateral;   MANDIBLE SURGERY  1954   WISDOM TOOTH EXTRACTION     Family History:  Family History  Problem Relation Age of  Onset   Hypertension Mother    Heart disease Father    Cancer Father    Family Psychiatric  History: Unremarkable Social History:  Social History   Substance and Sexual Activity  Alcohol Use No     Social History   Substance and Sexual Activity  Drug Use No    Social History   Socioeconomic History   Marital status: Married    Spouse name: Not on file   Number of children: 1   Years of education: Not on file   Highest education level: Not on file  Occupational History   Occupation: retired  Tobacco Use   Smoking status: Never   Smokeless tobacco: Never  Vaping Use   Vaping Use: Never used  Substance and Sexual Activity   Alcohol use: No   Drug use: No   Sexual activity: Yes    Birth control/protection: Post-menopausal  Other Topics Concern   Not on file  Social History Narrative   GI in [place] for colonoscopy   Dr Ronita Hipps - GYN      Denies Surgical history      Family history of varicose veins   Mother is 21      Regular exercise - NO   Social Determinants of Health   Financial Resource Strain: Not on file  Food Insecurity: Not on file  Transportation Needs: Not  on file  Physical Activity: Not on file  Stress: Not on file  Social Connections: Not on file   Additional Social History:                         Sleep: Good  Appetite:  Fair  Current Medications: Current Facility-Administered Medications  Medication Dose Route Frequency Provider Last Rate Last Admin   acetaminophen (TYLENOL) tablet 1,000 mg  1,000 mg Oral Q6H PRN Starkes-Perry, Gayland Curry, FNP       alum & mag hydroxide-simeth (MAALOX/MYLANTA) 200-200-20 MG/5ML suspension 30 mL  30 mL Oral Q4H PRN Starkes-Perry, Gayland Curry, FNP       aspirin chewable tablet 81 mg  81 mg Oral Daily Suella Broad, FNP   81 mg at 01/08/21 4742   cholecalciferol (VITAMIN D3) tablet 2,000 Units  2,000 Units Oral Daily Suella Broad, FNP   2,000 Units at 01/08/21 5956   docusate sodium  (COLACE) capsule 100 mg  100 mg Oral QHS Suella Broad, FNP   100 mg at 01/07/21 2103   feeding supplement (ENSURE ENLIVE / ENSURE PLUS) liquid 237 mL  237 mL Oral BID BM Parks Ranger, DO   237 mL at 01/08/21 0943   FLUoxetine (PROZAC) capsule 20 mg  20 mg Oral QHS Parks Ranger, DO   20 mg at 01/07/21 2103   vitamin B-12 (CYANOCOBALAMIN) tablet 500 mcg  500 mcg Oral Daily Suella Broad, FNP   500 mcg at 38/75/64 3329   And   folic acid (FOLVITE) tablet 0.5 mg  0.5 mg Oral Daily Suella Broad, FNP   0.5 mg at 01/08/21 5188   gabapentin (NEURONTIN) capsule 100 mg  100 mg Oral TID Suella Broad, FNP   100 mg at 01/08/21 0938   loratadine (CLARITIN) tablet 10 mg  10 mg Oral Daily Suella Broad, FNP   10 mg at 01/08/21 4166   magnesium hydroxide (MILK OF MAGNESIA) suspension 30 mL  30 mL Oral Daily PRN Suella Broad, FNP   30 mL at 01/02/21 1722   melatonin tablet 5 mg  5 mg Oral QHS PRN Parks Ranger, DO   5 mg at 01/05/21 2122   multivitamin with minerals tablet 1 tablet  1 tablet Oral Daily Parks Ranger, DO   1 tablet at 01/08/21 0939   OLANZapine (ZYPREXA) tablet 5 mg  5 mg Oral QHS Parks Ranger, DO   5 mg at 01/07/21 2103   polyethylene glycol (MIRALAX / GLYCOLAX) packet 17 g  17 g Oral Daily PRN Suella Broad, FNP   17 g at 01/02/21 1721   senna-docusate (Senokot-S) tablet 2 tablet  2 tablet Oral Daily Suella Broad, FNP   2 tablet at 01/08/21 0630    Lab Results: No results found for this or any previous visit (from the past 48 hour(s)).  Blood Alcohol level:  Lab Results  Component Value Date   ETH <10 16/02/930    Metabolic Disorder Labs: Lab Results  Component Value Date   HGBA1C 5.5 01/06/2021   MPG 111 01/06/2021   No results found for: PROLACTIN Lab Results  Component Value Date   CHOL 182 01/06/2021   TRIG 37 01/06/2021   HDL 58 01/06/2021    CHOLHDL 3.1 01/06/2021   VLDL 7 01/06/2021   LDLCALC 117 (H) 01/06/2021   LDLCALC 118 (H) 11/01/2019    Physical Findings: AIMS:  , ,  ,  ,  CIWA:    COWS:     Musculoskeletal: Strength & Muscle Tone: within normal limits Gait & Station: normal Patient leans: N/A  Psychiatric Specialty Exam:  Presentation  General Appearance: Appropriate for Environment; Casual  Eye Contact:Fair  Speech:Clear and Coherent; Normal Rate  Speech Volume:Decreased  Handedness:Right   Mood and Affect  Mood:Depressed  Affect:Depressed; Flat   Thought Process  Thought Processes:Coherent; Linear  Descriptions of Associations:Intact  Orientation:Full (Time, Place and Person)  Thought Content:Logical  History of Schizophrenia/Schizoaffective disorder:No  Duration of Psychotic Symptoms:N/A  Hallucinations:No data recorded Ideas of Reference:None  Suicidal Thoughts:No data recorded Homicidal Thoughts:No data recorded  Sensorium  Memory:Immediate Good; Recent Good; Remote Fair  Judgment:Fair  Insight:Fair   Executive Functions  Concentration:Fair  Attention Span:Fair  [place]   Psychomotor Activity  Psychomotor Activity:No data recorded  Assets  Assets:Desire for Improvement; Communication Skills; Leisure Time; Catering manager; Housing; Physical Health   Sleep  Sleep:No data recorded    Physical Exam Constitutional:      Appearance: She is underweight.  Neurological:     General: No focal deficit present.     Mental Status: She is alert and oriented to person, place, and time.  Psychiatric:        Attention and Perception: Attention and perception normal.        Mood and Affect: Mood is depressed. Affect is blunt.        Speech: Speech normal.        Behavior: Behavior is agitated and withdrawn. Behavior is cooperative.        Thought Content: Thought content is paranoid.        Cognition  and Memory: Cognition is impaired. She exhibits impaired remote memory.        Judgment: Judgment normal.   ROS Blood pressure (!) 158/80, pulse 65, temperature (!) 97.5 F (36.4 C), temperature source Oral, resp. rate 18, height 5\' 2"  (1.575 m), weight 36.8 kg, SpO2 100 %. Body mass index is 14.84 kg/m.   Treatment Plan Summary: Daily contact with patient to assess and evaluate symptoms and progress in treatment, Medication management, and Plan continue current medications.  Parks Ranger, DO 01/08/2021, 11:42 AM

## 2021-01-08 NOTE — Progress Notes (Signed)
Recreation Therapy Notes   Date: 01/08/2021  Time: 1:45pm     Location: Craft room    Behavioral response: N/A   Intervention Topic: Emotions    Discussion/Intervention: Patient did not attend group.   Clinical Observations/Feedback:  Patient did not attend group.    Naftali Carchi LRT/CTRS        Kohana Amble 01/08/2021 3:32 PM

## 2021-01-08 NOTE — Progress Notes (Signed)
Patient is alert and oriented. Denies SI, HI and AVH. Report anxiety 7/10, depression 7/10 and pain to lower back rating 7/10 but refused any intervention. Patient offer pain medication patient stated " It doesn't work, all I need to do is rest". Compliant with all due medications. Appetite is improving patient is currently in the day room among staff and peers for breakfast. Consume 100% of supplement and tolerated well. Remain safe on the unit.

## 2021-01-08 NOTE — Progress Notes (Signed)
Patient is calm and cooperative but is confused about the month and reason she is here. Pt also mentioned that she wants to speak with her doctor about discharge. Pt denies pain, SI, HI, AH, and VH. Pt reports feelings of anxiety 5/10 and depression 5/10. Pt complained that she has been waking up at night. Will continue to monitor with q 15 minute safety checks.

## 2021-01-09 NOTE — Progress Notes (Signed)
[facility] MD Progress Note  01/09/2021 2:39 PM Tanya Walker  MRN:  [id number] Subjective: Follow-up for this 80 year old woman with depression.  Patient seen and chart reviewed.  Came to see patient in her room where she was awake but lying in bed.  Asked her how she was doing today and whether she was getting enough to eat.  Something about this seems to have really angered her.  She became abruptly angry at me started shouting and insisting that she was not getting enough food.  Insisted on getting out of bed right that minute and walking to the nursing station to tell the nurses that she was not getting enough food.  Nurses report that this is the first time she has made this complaint that they were aware of and in fact at times skips meals.  Patient denies any suicidal ideation.  She was quite irritable and unable to really give much coherent answer to other questions. 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   Depression    years ago   Headache    sinus headaches   Lipoma of arm    Right   Medical history non-contributory    Osteoporosis    SVD (spontaneous vaginal delivery)    x 2   Varicose vein    Vitamin D deficiency     Past Surgical History:  Procedure Laterality Date   ANTERIOR APPROACH HEMI HIP ARTHROPLASTY Right 09/04/2020   Procedure: ANTERIOR APPROACH HEMI HIP ARTHROPLASTY;  Surgeon: Rod Can, MD;  Location: [facility];  Service: Orthopedics;  Laterality: Right;   [place]   COLONOSCOPY     GANGLION CYST EXCISION     x2 left and right arm   HYSTEROSCOPY WITH D & C  N/A 07/15/2013   Procedure: DILATATION AND CURETTAGE /HYSTEROSCOPY;  Surgeon: Lovenia Kim, MD;  Location: [facility];  Service: Gynecology;  Laterality: N/A;   KYPHOPLASTY Bilateral 05/04/2020   Procedure: KYPHOPLASTY THORACIC TWELVE;  Surgeon: Vallarie Mare, MD;  Location: [facility];  Service: Neurosurgery;  Laterality: Bilateral;   MANDIBLE SURGERY  1954   WISDOM TOOTH EXTRACTION     Family History:  Family History  Problem Relation Age of Onset   Hypertension Mother    Heart disease Father    Cancer Father    Family Psychiatric  History: See previous Social History:  Social History   Substance and Sexual Activity  Alcohol Use No     Social History   Substance and Sexual Activity  Drug Use No    Social History   Socioeconomic History   Marital status: Married    Spouse name: Not on file   Number of children: 1   Years of education: Not on file   Highest education level: Not on file  Occupational History   Occupation: retired  Tobacco Use   Smoking status: Never   Smokeless tobacco: Never  Vaping Use   Vaping Use: Never used  Substance and Sexual Activity   Alcohol use: No   Drug use: No   Sexual activity: Yes  Birth control/protection: Post-menopausal  Other Topics Concern   Not on file  Social History Narrative   GI in [place] for colonoscopy   Dr Ronita Hipps - GYN      Denies Surgical history      Family history of varicose veins   Mother is 72      Regular exercise - NO   Social Determinants of Health   Financial Resource Strain: Not on file  Food Insecurity: Not on file  Transportation Needs: Not on file  Physical Activity: Not on file  Stress: Not on file  Social Connections: Not on file   Additional Social History:                         Sleep: Fair  Appetite:  Fair  Current Medications: Current Facility-Administered Medications  Medication Dose Route Frequency Provider Last Rate Last Admin   acetaminophen (TYLENOL) tablet 1,000  mg  1,000 mg Oral Q6H PRN Starkes-Perry, Gayland Curry, FNP       alum & mag hydroxide-simeth (MAALOX/MYLANTA) 200-200-20 MG/5ML suspension 30 mL  30 mL Oral Q4H PRN Starkes-Perry, Gayland Curry, FNP       aspirin chewable tablet 81 mg  81 mg Oral Daily Suella Broad, FNP   81 mg at 01/09/21 9163   cholecalciferol (VITAMIN D3) tablet 2,000 Units  2,000 Units Oral Daily Suella Broad, FNP   2,000 Units at 01/09/21 0905   docusate sodium (COLACE) capsule 100 mg  100 mg Oral QHS Suella Broad, FNP   100 mg at 01/08/21 2118   feeding supplement (ENSURE ENLIVE / ENSURE PLUS) liquid 237 mL  237 mL Oral BID BM Parks Ranger, DO   237 mL at 01/09/21 1334   FLUoxetine (PROZAC) capsule 20 mg  20 mg Oral QHS Parks Ranger, DO   20 mg at 01/08/21 2118   vitamin B-12 (CYANOCOBALAMIN) tablet 500 mcg  500 mcg Oral Daily Suella Broad, FNP   500 mcg at 84/66/59 9357   And   folic acid (FOLVITE) tablet 0.5 mg  0.5 mg Oral Daily Suella Broad, FNP   0.5 mg at 01/09/21 0177   gabapentin (NEURONTIN) capsule 100 mg  100 mg Oral TID Suella Broad, FNP   100 mg at 01/09/21 0905   loratadine (CLARITIN) tablet 10 mg  10 mg Oral Daily Suella Broad, FNP   10 mg at 01/09/21 9390   magnesium hydroxide (MILK OF MAGNESIA) suspension 30 mL  30 mL Oral Daily PRN Suella Broad, FNP   30 mL at 01/02/21 1722   melatonin tablet 5 mg  5 mg Oral QHS PRN Parks Ranger, DO   5 mg at 01/08/21 2118   multivitamin with minerals tablet 1 tablet  1 tablet Oral Daily Parks Ranger, DO   1 tablet at 01/09/21 0904   OLANZapine (ZYPREXA) tablet 5 mg  5 mg Oral QHS Parks Ranger, DO   5 mg at 01/08/21 2118   polyethylene glycol (MIRALAX / GLYCOLAX) packet 17 g  17 g Oral Daily PRN Suella Broad, FNP   17 g at 01/02/21 1721   senna-docusate (Senokot-S) tablet 2 tablet  2 tablet Oral Daily Suella Broad, FNP   2 tablet at  01/09/21 3009    Lab Results: No results found for this or any previous visit (from the past 48 hour(s)).  Blood Alcohol level:  Lab Results  Component Value  Date   ETH <10 68/34/1962    Metabolic Disorder Labs: Lab Results  Component Value Date   HGBA1C 5.5 01/06/2021   MPG 111 01/06/2021   No results found for: PROLACTIN Lab Results  Component Value Date   CHOL 182 01/06/2021   TRIG 37 01/06/2021   HDL 58 01/06/2021   CHOLHDL 3.1 01/06/2021   VLDL 7 01/06/2021   LDLCALC 117 (H) 01/06/2021   LDLCALC 118 (H) 11/01/2019    Physical Findings: AIMS:  , ,  ,  ,    CIWA:    COWS:     Musculoskeletal: Strength & Muscle Tone: within normal limits Gait & Station: normal Patient leans: N/A  Psychiatric Specialty Exam:  Presentation  General Appearance: Appropriate for Environment; Casual  Eye Contact:Fair  Speech:Clear and Coherent; Normal Rate  Speech Volume:Decreased  Handedness:Right   Mood and Affect  Mood:Depressed  Affect:Depressed; Flat   Thought Process  Thought Processes:Coherent; Linear  Descriptions of Associations:Intact  Orientation:Full (Time, Place and Person)  Thought Content:Logical  History of Schizophrenia/Schizoaffective disorder:No  Duration of Psychotic Symptoms:N/A  Hallucinations:No data recorded Ideas of Reference:None  Suicidal Thoughts:No data recorded Homicidal Thoughts:No data recorded  Sensorium  Memory:Immediate Good; Recent Good; Remote Fair  Judgment:Fair  Insight:Fair   Executive Functions  Concentration:Fair  Attention Span:Fair  [place]   Psychomotor Activity  Psychomotor Activity:No data recorded  Assets  Assets:Desire for Improvement; Armed forces logistics/support/administrative officer; Leisure Time; Catering manager; Housing; Physical Health   Sleep  Sleep:No data recorded   Physical Exam: Physical Exam Vitals and nursing note reviewed.   Constitutional:      Appearance: Normal appearance.  HENT:     Head: Normocephalic and atraumatic.     Mouth/Throat:     Pharynx: Oropharynx is clear.  Eyes:     Pupils: Pupils are equal, round, and reactive to light.  Cardiovascular:     Rate and Rhythm: Normal rate and regular rhythm.  Pulmonary:     Effort: Pulmonary effort is normal.     Breath sounds: Normal breath sounds.  Abdominal:     General: Abdomen is flat.     Palpations: Abdomen is soft.  Musculoskeletal:        General: Normal range of motion.  Skin:    General: Skin is warm and dry.  Neurological:     General: No focal deficit present.     Mental Status: She is alert. Mental status is at baseline.  Psychiatric:        Attention and Perception: She is inattentive.        Mood and Affect: Mood is anxious. Affect is angry and inappropriate.        Speech: Speech is tangential.        Behavior: Behavior is agitated.        Thought Content: Thought content normal.        Cognition and Memory: Cognition is impaired.        Judgment: Judgment is impulsive.   Review of Systems  Constitutional: Negative.   HENT: Negative.    Eyes: Negative.   Respiratory: Negative.    Cardiovascular: Negative.   Gastrointestinal: Negative.   Musculoskeletal: Negative.   Skin: Negative.   Neurological: Negative.   Psychiatric/Behavioral:  Negative for depression, hallucinations, substance abuse and suicidal ideas. The patient is not nervous/anxious.   Blood pressure (!) 146/64, pulse 66, temperature 98 F (36.7 C), resp. rate 20, height 5\' 2"  (1.575 m), weight 36.8 kg,  SpO2 100 %. Body mass index is 14.84 kg/m.   Treatment Plan Summary: Medication management and Plan patient walked with a walker but made a production out of demonstrating to me that she did not really need it.  She once again repeated that she was not getting enough food.  I put in an order to get her double portions.  No other change to medication at this  point.  Case reviewed with nursing.  Alethia Berthold, MD 01/09/2021, 2:39 PM

## 2021-01-09 NOTE — Progress Notes (Signed)
Patient complained of not getting enough food on her meal tray today. Pt was irritable and upset about not having enough food but an MHT explained to her how the menus work and that she'll be getting double portions of food tomorrow. Pt denies pain but stated her lower back hurts for a while but then feels better after walking for a while. Pt is very independent and takes care of her needs well. Medication compliant. Pt appears as anxious. Pt denies SI, HI, and AVH. Pt is guarded and isolative to her room. Will continue to monitor with q 15 minute safety checks.

## 2021-01-09 NOTE — Progress Notes (Signed)
Patient became slightly agitated during encounter with MD. Patient report to MD that she was not getting enough to eat. Writer went into patient room in order to address patient concerns. Patient report to writer that she is not getting enough food on her tray during meal, but then refuse suggestion of double portion offered. Patient stated "no" , "I've been in an institution before, I don't like institution food". Also verbalize pants being to tight writer offer patient a large size of scrub which patient refuse. Patient observed to be anxious and irritable at this time. Patient remain in room in bed in no apparent distress at this time. Will continue to monitor patient status. Q15 minute check continue, patient remain safe on the unit.

## 2021-01-09 NOTE — Progress Notes (Signed)
Patient in the day room for supper consume 100% of supper with good appetite. Writer ask patient if she want any more food, patient decline and stated " the food was good, I'm ok".  Patient then ask writer for a straw and a lid. Request met and walk back to her room. Patient is currently  in bed resting quietly at this time in no apparent distress. Remain safe on the unit with q15 minute checks.

## 2021-01-09 NOTE — Progress Notes (Signed)
Patient remains alert and oriented. Continues to express anxiety and depression. Denies SI, HI, and AVH. Denies pain or discomfort at this time. Continue to self isolate in room, only come out for meals and return back to her room. Appetite is improving, ate breakfast in the day room with good appetite. Remain ambulatory with four wheel walker. Remain safe on the unit with q15 minute safety checks.

## 2021-01-10 DIAGNOSIS — Z20822 Contact with and (suspected) exposure to covid-19: Secondary | ICD-10-CM | POA: Diagnosis not present

## 2021-01-10 NOTE — Progress Notes (Signed)
Pt visible on the unit for brief periods of time, she eat in the TV room, but frequent in bed in her room. She had her temp increased in her room and she ambulates well with the front wheel walker.She denies SI/HI or AVH. She does not want to talk about anything, she is easily irritated if things don't go her way and she does not like to wait. Affect sad/depressed and she appears anxious at times. Pt eats a lot , but her weight does not increase. She refuses to do any of her ADLs.

## 2021-01-10 NOTE — Progress Notes (Signed)
[facility] MD Progress Note  01/10/2021 2:38 PM Tanya Walker  MRN:  [id number] Subjective: Follow-up with this 80 year old woman with a diagnosis of depression.  Patient today presented quite differently to yesterday.  Today she was pleasant and appropriate interacting with me.  Denied feeling depressed.  Admitted that she did not think she was eating enough.  Did not have any other complaints.  No complaints of hallucinations.  Nursing staff is concerned about her persistent low weight despite eating well. Principal Problem: MDD (major depressive disorder), recurrent episode, severe ([place]) Diagnosis: Principal Problem:   MDD (major depressive disorder), recurrent episode, severe ([place])  Total Time spent with patient: 30 minutes  Past Psychiatric History: Past history of depression and anxiety.  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   Depression    years ago   Headache    sinus headaches   Lipoma of arm    Right   Medical history non-contributory    Osteoporosis    SVD (spontaneous vaginal delivery)    x 2   Varicose vein    Vitamin D deficiency     Past Surgical History:  Procedure Laterality Date   ANTERIOR APPROACH HEMI HIP ARTHROPLASTY Right 09/04/2020   Procedure: ANTERIOR APPROACH HEMI HIP ARTHROPLASTY;  Surgeon: Rod Can, MD;  Location: [facility];  Service: Orthopedics;  Laterality: Right;   [place]   COLONOSCOPY     GANGLION CYST EXCISION     x2 left and right arm   HYSTEROSCOPY WITH D & C N/A 07/15/2013   Procedure: DILATATION AND CURETTAGE /HYSTEROSCOPY;  Surgeon: Lovenia Kim, MD;  Location: [facility];  Service: Gynecology;  Laterality: N/A;   KYPHOPLASTY Bilateral 05/04/2020   Procedure: KYPHOPLASTY THORACIC TWELVE;  Surgeon: Vallarie Mare, MD;  Location: [facility];  Service: Neurosurgery;  Laterality: Bilateral;   MANDIBLE SURGERY  1954    WISDOM TOOTH EXTRACTION     Family History:  Family History  Problem Relation Age of Onset   Hypertension Mother    Heart disease Father    Cancer Father    Family Psychiatric  History: See previous Social History:  Social History   Substance and Sexual Activity  Alcohol Use No     Social History   Substance and Sexual Activity  Drug Use No    Social History   Socioeconomic History   Marital status: Married    Spouse name: Not on file   Number of children: 1   Years of education: Not on file   Highest education level: Not on file  Occupational History   Occupation: retired  Tobacco Use   Smoking status: Never   Smokeless tobacco: Never  Vaping Use   Vaping Use: Never used  Substance and Sexual Activity   Alcohol use: No   Drug use: No   Sexual activity: Yes    Birth control/protection: Post-menopausal  Other Topics Concern   Not on file  Social History Narrative   GI in [place] for colonoscopy   Dr Ronita Hipps - GYN      Denies Surgical history      Family history of varicose veins   Mother is 82      Regular exercise - NO   Social Determinants of Health   Financial Resource Strain: Not on file  Food Insecurity:  Not on file  Transportation Needs: Not on file  Physical Activity: Not on file  Stress: Not on file  Social Connections: Not on file   Additional Social History:                         Sleep: Fair  Appetite:  Poor  Current Medications: Current Facility-Administered Medications  Medication Dose Route Frequency Provider Last Rate Last Admin   acetaminophen (TYLENOL) tablet 1,000 mg  1,000 mg Oral Q6H PRN Starkes-Perry, Gayland Curry, FNP       alum & mag hydroxide-simeth (MAALOX/MYLANTA) 200-200-20 MG/5ML suspension 30 mL  30 mL Oral Q4H PRN Starkes-Perry, Gayland Curry, FNP       aspirin chewable tablet 81 mg  81 mg Oral Daily Suella Broad, FNP   81 mg at 01/10/21 1006   cholecalciferol (VITAMIN D3) tablet 2,000 Units  2,000 Units Oral  Daily Suella Broad, FNP   2,000 Units at 01/10/21 1006   docusate sodium (COLACE) capsule 100 mg  100 mg Oral QHS Suella Broad, FNP   100 mg at 01/09/21 2114   feeding supplement (ENSURE ENLIVE / ENSURE PLUS) liquid 237 mL  237 mL Oral BID BM Parks Ranger, DO   237 mL at 01/10/21 1018   FLUoxetine (PROZAC) capsule 20 mg  20 mg Oral QHS Parks Ranger, DO   20 mg at 01/09/21 2115   vitamin B-12 (CYANOCOBALAMIN) tablet 500 mcg  500 mcg Oral Daily Suella Broad, FNP   500 mcg at 12/02/83 2778   And   folic acid (FOLVITE) tablet 0.5 mg  0.5 mg Oral Daily Suella Broad, FNP   0.5 mg at 01/10/21 1008   gabapentin (NEURONTIN) capsule 100 mg  100 mg Oral TID Suella Broad, FNP   100 mg at 01/10/21 1006   loratadine (CLARITIN) tablet 10 mg  10 mg Oral Daily Suella Broad, FNP   10 mg at 01/10/21 1006   magnesium hydroxide (MILK OF MAGNESIA) suspension 30 mL  30 mL Oral Daily PRN Suella Broad, FNP   30 mL at 01/02/21 1722   melatonin tablet 5 mg  5 mg Oral QHS PRN Parks Ranger, DO   5 mg at 01/09/21 2115   multivitamin with minerals tablet 1 tablet  1 tablet Oral Daily Parks Ranger, DO   1 tablet at 01/10/21 1005   OLANZapine (ZYPREXA) tablet 5 mg  5 mg Oral QHS Parks Ranger, DO   5 mg at 01/09/21 2115   polyethylene glycol (MIRALAX / GLYCOLAX) packet 17 g  17 g Oral Daily PRN Suella Broad, FNP   17 g at 01/02/21 1721   senna-docusate (Senokot-S) tablet 2 tablet  2 tablet Oral Daily Suella Broad, FNP   2 tablet at 01/10/21 1006    Lab Results: No results found for this or any previous visit (from the past 48 hour(s)).  Blood Alcohol level:  Lab Results  Component Value Date   ETH <10 24/23/5361    Metabolic Disorder Labs: Lab Results  Component Value Date   HGBA1C 5.5 01/06/2021   MPG 111 01/06/2021   No results found for: PROLACTIN Lab Results  Component  Value Date   CHOL 182 01/06/2021   TRIG 37 01/06/2021   HDL 58 01/06/2021   CHOLHDL 3.1 01/06/2021   VLDL 7 01/06/2021   LDLCALC 117 (H) 01/06/2021   LDLCALC 118 (H) 11/01/2019  Physical Findings: AIMS:  , ,  ,  ,    CIWA:    COWS:     Musculoskeletal: Strength & Muscle Tone: within normal limits Gait & Station: normal Patient leans: N/A  Psychiatric Specialty Exam:  Presentation  General Appearance: Appropriate for Environment; Casual  Eye Contact:Fair  Speech:Clear and Coherent; Normal Rate  Speech Volume:Decreased  Handedness:Right   Mood and Affect  Mood:Depressed  Affect:Depressed; Flat   Thought Process  Thought Processes:Coherent; Linear  Descriptions of Associations:Intact  Orientation:Full (Time, Place and Person)  Thought Content:Logical  History of Schizophrenia/Schizoaffective disorder:No  Duration of Psychotic Symptoms:N/A  Hallucinations:No data recorded Ideas of Reference:None  Suicidal Thoughts:No data recorded Homicidal Thoughts:No data recorded  Sensorium  Memory:Immediate Good; Recent Good; Remote Fair  Judgment:Fair  Insight:Fair   Executive Functions  Concentration:Fair  Attention Span:Fair  [place]   Psychomotor Activity  Psychomotor Activity:No data recorded  Assets  Assets:Desire for Improvement; Communication Skills; Leisure Time; Catering manager; Housing; Physical Health   Sleep  Sleep:No data recorded   Physical Exam: Physical Exam Vitals and nursing note reviewed.  Constitutional:      Appearance: Normal appearance.  HENT:     Head: Normocephalic and atraumatic.     Mouth/Throat:     Pharynx: Oropharynx is clear.  Eyes:     Pupils: Pupils are equal, round, and reactive to light.  Cardiovascular:     Rate and Rhythm: Normal rate and regular rhythm.  Pulmonary:     Effort: Pulmonary effort is normal.     Breath sounds: Normal  breath sounds.  Abdominal:     General: Abdomen is flat.     Palpations: Abdomen is soft.  Musculoskeletal:        General: Normal range of motion.  Skin:    General: Skin is warm and dry.  Neurological:     General: No focal deficit present.     Mental Status: She is alert. Mental status is at baseline.  Psychiatric:        Attention and Perception: Attention normal.        Mood and Affect: Mood normal.        Speech: Speech normal.        Behavior: Behavior is cooperative.        Thought Content: Thought content normal.        Cognition and Memory: Memory is impaired.   Review of Systems  Constitutional: Negative.   HENT: Negative.    Eyes: Negative.   Respiratory: Negative.    Cardiovascular: Negative.   Gastrointestinal: Negative.   Musculoskeletal: Negative.   Skin: Negative.   Neurological: Negative.   Psychiatric/Behavioral: Negative.    Blood pressure 138/73, pulse 70, temperature (!) 97.4 F (36.3 C), resp. rate 20, height 5\' 2"  (1.575 m), weight 36.6 kg, SpO2 98 %. Body mass index is 14.76 kg/m.   Treatment Plan Summary: Plan no change to medication management.  Reviewed situation with nursing.  Continue to encourage patient to be up out of bed when she can and eat better.  Alethia Berthold, MD 01/10/2021, 2:38 PM

## 2021-01-10 NOTE — Plan of Care (Signed)
See notes Problem: Education: Goal: Knowledge of [facility] Education information/materials will improve Outcome: Progressing Goal: Emotional status will improve Outcome: Progressing Goal: Mental status will improve Outcome: Progressing   Problem: Activity: Goal: Interest or engagement in activities will improve Outcome: Progressing Goal: Sleeping patterns will improve Outcome: Progressing   Problem: Health Behavior/Discharge Planning: Goal: Compliance with treatment plan for underlying cause of condition will improve Outcome: Progressing   Problem: Safety: Goal: Periods of time without injury will increase Outcome: Progressing   Problem: Education: Goal: Knowledge of General Education information will improve Description: Including pain rating scale, medication(s)/side effects and non-pharmacologic comfort measures Outcome: Progressing   Problem: Health Behavior/Discharge Planning: Goal: Ability to manage health-related needs will improve Outcome: Progressing   Problem: Clinical Measurements: Goal: Ability to maintain clinical measurements within normal limits will improve Outcome: Progressing Goal: Will remain free from infection Outcome: Progressing Goal: Diagnostic test results will improve Outcome: Progressing Goal: Respiratory complications will improve Outcome: Progressing Goal: Cardiovascular complication will be avoided Outcome: Progressing   Problem: Activity: Goal: Risk for activity intolerance will decrease Outcome: Progressing   Problem: Nutrition: Goal: Adequate nutrition will be maintained Outcome: Progressing   Problem: Coping: Goal: Level of anxiety will decrease Outcome: Progressing   Problem: Elimination: Goal: Will not experience complications related to bowel motility Outcome: Progressing Goal: Will not experience complications related to urinary retention Outcome: Progressing   Problem: Pain Managment: Goal: General  experience of comfort will improve Outcome: Progressing   Problem: Safety: Goal: Ability to remain free from injury will improve Outcome: Progressing   Problem: Skin Integrity: Goal: Risk for impaired skin integrity will decrease Outcome: Progressing

## 2021-01-10 NOTE — Progress Notes (Signed)
Patient appears as anxious and is more irritable than usual. She stated that she slept "ok". She denies pain, SI, HI, and AVH at this time. She rates her anxiety and depression a 7-8/10. Pt reported that she has not had a BM in a few days. Pt states her abdomen is non-tender. Administered Miralax PRN as per MAR orders. Will continue to monitor with q 15 minute safety checks.

## 2021-01-11 DIAGNOSIS — Z20822 Contact with and (suspected) exposure to covid-19: Secondary | ICD-10-CM | POA: Diagnosis not present

## 2021-01-11 NOTE — Progress Notes (Signed)
   01/11/21 2000  Psych Admission Type (Psych Patients Only)  Admission Status Involuntary  Psychosocial Assessment  Patient Complaints Anxiety;Depression  Eye Contact Fair  Facial Expression Anxious  Affect Anxious;Depressed  Speech Logical/coherent  Interaction Assertive  Motor Activity Slow  Appearance/Hygiene In scrubs  Behavior Characteristics Cooperative;Anxious  Mood Anxious;Apprehensive  Thought Process  Coherency WDL  Content WDL  Delusions None reported or observed  Perception WDL  Hallucination None reported or observed  Judgment WDL  Confusion None  Danger to Self  Current suicidal ideation? Denies  Danger to Others  Danger to Others None reported or observed   Patient rated her anxiety 9/10 and depression 7/10 Denies SI/HI/A/VH and verbally contracted for safety. Patient has been isolative to her room this shift. Patient continues to use her walker for ambulation. Compliant with medication and no adverse drug noted. Fall protocol in place and Patient remains safe. Support and encouragement ongoing.

## 2021-01-11 NOTE — Group Note (Signed)
[facility] LCSW Group Therapy Note    Group Date: 01/11/2021 Start Time: 1330 End Time: 1345  Type of Therapy and Topic:  Group Therapy:  Overcoming Obstacles  Participation Level:  [facility] PARTICIPATION LEVEL: Did Not Attend    Description of Group:   In this group patients will be encouraged to explore what they see as obstacles to their own wellness and recovery. They will be guided to discuss their thoughts, feelings, and behaviors related to these obstacles. The group will process together ways to cope with barriers, with attention given to specific choices patients can make. Each patient will be challenged to identify changes they are motivated to make in order to overcome their obstacles. This group will be process-oriented, with patients participating in exploration of their own experiences as well as giving and receiving support and challenge from other group members.  Therapeutic Goals: 1. Patient will identify personal and current obstacles as they relate to admission. 2. Patient will identify barriers that currently interfere with their wellness or overcoming obstacles.  3. Patient will identify feelings, thought process and behaviors related to these barriers. 4. Patient will identify two changes they are willing to make to overcome these obstacles:    Summary of Patient Progress   X   Therapeutic Modalities:   Cognitive Behavioral Therapy Solution Focused Therapy Motivational Interviewing Relapse Prevention Therapy   Marrell Dicaprio A Taneisha Fuson, LCSWA 

## 2021-01-11 NOTE — BH IP Treatment Plan (Signed)
Interdisciplinary Treatment and Diagnostic Plan Update  01/11/2021 Time of Session: 9:30AM Tanya Walker MRN: [id number]  Principal Diagnosis: MDD (major depressive disorder), recurrent episode, severe ([place])  Secondary Diagnoses: Principal Problem:   MDD (major depressive disorder), recurrent episode, severe ([place])   Current Medications:  Current Facility-Administered Medications  Medication Dose Route Frequency Provider Last Rate Last Admin   acetaminophen (TYLENOL) tablet 1,000 mg  1,000 mg Oral Q6H PRN Starkes-Perry, Gayland Curry, FNP       alum & mag hydroxide-simeth (MAALOX/MYLANTA) 200-200-20 MG/5ML suspension 30 mL  30 mL Oral Q4H PRN Starkes-Perry, Gayland Curry, FNP       aspirin chewable tablet 81 mg  81 mg Oral Daily Suella Broad, FNP   81 mg at 01/11/21 1025   cholecalciferol (VITAMIN D3) tablet 2,000 Units  2,000 Units Oral Daily Suella Broad, FNP   2,000 Units at 01/11/21 1024   docusate sodium (COLACE) capsule 100 mg  100 mg Oral QHS Suella Broad, FNP   100 mg at 01/10/21 2056   feeding supplement (ENSURE ENLIVE / ENSURE PLUS) liquid 237 mL  237 mL Oral BID BM Parks Ranger, DO   237 mL at 01/11/21 1028   FLUoxetine (PROZAC) capsule 20 mg  20 mg Oral QHS Parks Ranger, DO   20 mg at 01/10/21 2056   vitamin B-12 (CYANOCOBALAMIN) tablet 500 mcg  500 mcg Oral Daily Suella Broad, FNP   500 mcg at 72/09/47 0962   And   folic acid (FOLVITE) tablet 0.5 mg  0.5 mg Oral Daily Suella Broad, FNP   0.5 mg at 01/11/21 1024   gabapentin (NEURONTIN) capsule 100 mg  100 mg Oral TID Suella Broad, FNP   100 mg at 01/11/21 1024   loratadine (CLARITIN) tablet 10 mg  10 mg Oral Daily Suella Broad, FNP   10 mg at 01/11/21 1024   magnesium hydroxide (MILK OF MAGNESIA) suspension 30 mL  30 mL Oral Daily PRN Suella Broad, FNP   30 mL at 01/02/21 1722   melatonin tablet 5 mg  5 mg Oral QHS PRN Parks Ranger, DO   5 mg at 01/10/21 2056   multivitamin with minerals tablet 1 tablet  1 tablet Oral Daily Parks Ranger, DO   1 tablet at 01/11/21 1024   OLANZapine (ZYPREXA) tablet 5 mg  5 mg Oral QHS Parks Ranger, DO   5 mg at 01/10/21 2056   polyethylene glycol (MIRALAX / GLYCOLAX) packet 17 g  17 g Oral Daily PRN Suella Broad, FNP   17 g at 01/10/21 2102   senna-docusate (Senokot-S) tablet 2 tablet  2 tablet Oral Daily Suella Broad, FNP   2 tablet at 01/11/21 1025   PTA Medications: Medications Prior to Admission  Medication Sig Dispense Refill Last Dose   acetaminophen (TYLENOL) 500 MG tablet Take 1,000 mg by mouth every 6 (six) hours as needed for moderate pain, mild pain, headache or fever.      ASPIRIN 81 PO Take 1 tablet by mouth daily.      Calcium Carbonate (CALCIUM-CARB 600 PO) Take 1 tablet by mouth 2 (two) times daily.      cetirizine (ZYRTEC ALLERGY) 10 MG tablet Take 1 tablet (10 mg total) by mouth daily. 30 tablet 11    Cholecalciferol (VITAMIN D3) 2000 units capsule Take 1 capsule (2,000 Units total) by mouth daily. 100 capsule 3    Cobalamin Combinations (VITAMIN  B12-FOLIC ACID PO) Place 1 lozenge under the tongue in the morning.      docusate sodium (COLACE) 100 MG capsule Take 100 mg by mouth at bedtime.      escitalopram (LEXAPRO) 10 MG tablet Take 10 mg by mouth every evening.      gabapentin (NEURONTIN) 100 MG capsule Take 1 capsule (100 mg total) by mouth 3 (three) times daily. 90 capsule 3    KETOCONAZOLE, TOPICAL, (NIZORAL A-D) 1 % SHAM Apply 1 application topically once a week. Wednesday      melatonin 5 MG TABS Take 5 mg by mouth at bedtime.      Multiple Vitamins-Minerals (PRESERVISION AREDS 2 PO) Take 1 capsule by mouth 2 (two) times daily.      Nutritional Supplements (RESOURCE 2.0 PO) Take 180 mLs by mouth 2 (two) times daily.      polyethylene glycol (MIRALAX / GLYCOLAX) 17 g packet Take 17 g by mouth daily as needed for  moderate constipation or mild constipation. (Patient taking differently: Take 17 g by mouth daily.) 14 each 0    SALONPAS 3.02-13-08 % PTCH Apply 1 patch topically See admin instructions. Apply 1 patch to the lower back and remove (at most) after 8 hours- once a day as needed for pain      senna-docusate (SENOKOT-S) 8.6-50 MG tablet Take 2 tablets by mouth daily.      sodium phosphate (FLEET) 7-19 GM/118ML ENEM Place 1 enema rectally daily as needed for severe constipation or mild constipation.       Patient Stressors: Other:     Patient Strengths: Supportive family/friends   Treatment Modalities: Medication Management, Group therapy, Case management,  1 to 1 session with clinician, Psychoeducation, Recreational therapy.   Physician Treatment Plan for Primary Diagnosis: MDD (major depressive disorder), recurrent episode, severe ([place]) Long Term Goal(s): Improvement in symptoms so as ready for discharge   Short Term Goals: Ability to identify changes in lifestyle to reduce recurrence of condition will improve Ability to verbalize feelings will improve Ability to disclose and discuss suicidal ideas Ability to demonstrate self-control will improve Ability to identify and develop effective coping behaviors will improve Ability to maintain clinical measurements within normal limits will improve Compliance with prescribed medications will improve Ability to identify triggers associated with substance abuse/mental health issues will improve  Medication Management: Evaluate patient's response, side effects, and tolerance of medication regimen.  Therapeutic Interventions: 1 to 1 sessions, Unit Group sessions and Medication administration.  Evaluation of Outcomes: Progressing  Physician Treatment Plan for Secondary Diagnosis: Principal Problem:   MDD (major depressive disorder), recurrent episode, severe ([place])  Long Term Goal(s): Improvement in symptoms so as ready for discharge   Short Term  Goals: Ability to identify changes in lifestyle to reduce recurrence of condition will improve Ability to verbalize feelings will improve Ability to disclose and discuss suicidal ideas Ability to demonstrate self-control will improve Ability to identify and develop effective coping behaviors will improve Ability to maintain clinical measurements within normal limits will improve Compliance with prescribed medications will improve Ability to identify triggers associated with substance abuse/mental health issues will improve     Medication Management: Evaluate patient's response, side effects, and tolerance of medication regimen.  Therapeutic Interventions: 1 to 1 sessions, Unit Group sessions and Medication administration.  Evaluation of Outcomes: Progressing   RN Treatment Plan for Primary Diagnosis: MDD (major depressive disorder), recurrent episode, severe ([place]) Long Term Goal(s): Knowledge of disease and therapeutic regimen to maintain health will  improve  Short Term Goals: Ability to verbalize frustration and anger appropriately will improve, Ability to demonstrate self-control, Ability to participate in decision making will improve, Ability to verbalize feelings will improve, Ability to disclose and discuss suicidal ideas, Ability to identify and develop effective coping behaviors will improve, and Compliance with prescribed medications will improve  Medication Management: RN will administer medications as ordered by provider, will assess and evaluate patient's response and provide education to patient for prescribed medication. RN will report any adverse and/or side effects to prescribing provider.  Therapeutic Interventions: 1 on 1 counseling sessions, Psychoeducation, Medication administration, Evaluate responses to treatment, Monitor vital signs and CBGs as ordered, Perform/monitor CIWA, COWS, AIMS and Fall Risk screenings as ordered, Perform wound care treatments as  ordered.  Evaluation of Outcomes: Progressing   LCSW Treatment Plan for Primary Diagnosis: MDD (major depressive disorder), recurrent episode, severe ([place]) Long Term Goal(s): Safe transition to appropriate next level of care at discharge, Engage patient in therapeutic group addressing interpersonal concerns.  Short Term Goals: Engage patient in aftercare planning with referrals and resources, Increase social support, Increase ability to appropriately verbalize feelings, Increase emotional regulation, Facilitate acceptance of mental health diagnosis and concerns, and Identify triggers associated with mental health/substance abuse issues  Therapeutic Interventions: Assess for all discharge needs, 1 to 1 time with Social worker, Explore available resources and support systems, Assess for adequacy in community support network, Educate family and significant other(s) on suicide prevention, Complete Psychosocial Assessment, Interpersonal group therapy.  Evaluation of Outcomes: Progressing   Progress in Treatment: Attending groups: No. Participating in groups: No. Taking medication as prescribed: Yes. Toleration medication: Yes. Family/Significant other contact made: No, will contact:  pt declined permission to speak with family/significant other Patient understands diagnosis: Yes. Discussing patient identified problems/goals with staff: No. Medical problems stabilized or resolved: Yes. Denies suicidal/homicidal ideation: Yes. Issues/concerns per patient self-inventory: No. Other: None  New problem(s) identified: No, Describe:  None  New Short Term/Long Term Goal(s): elimination of symptoms of psychosis, medication management for mood stabilization; elimination of SI thoughts; development of comprehensive mental wellness plan. Update 01/06/21: No changes at this time. Update 01/11/21: No changes at this time.      Patient Goals:  "I don't know" Update 01/06/21: No changes at this time.  Update 01/11/21: No changes at this time.    Discharge Plan or Barriers: CSW will assist pt development of appropriate discharge/aftercare plan. Update 01/06/21: No changes at this time. Update 01/11/21: CSW was contacted by pt's social worker, Susa Griffins, at [facility] who stated pt can return back to the facility and will be moving to the skilled nursing facility upon her return. She stated transportation will need to be arranged for pt. CSW will continue to update social worker regarding approximate discharge date.    Reason for Continuation of Hospitalization: Depression Medication stabilization   Estimated Length of Stay: TBD  Scribe for Treatment Team: Lillyana Majette A [place], [place] 01/11/2021 11:00 AM

## 2021-01-11 NOTE — Progress Notes (Signed)
Patient remain alert oriented, ambulatory with front wheel walker. Denies pain or discomfort at this time. Verbalize depression and anxiety rating 7/10. Denies SI, HI and AVH. Ate breakfast in the day room among staff and peers with good appetite. Compliant with medications. Remain safe on the unit with q15 minute safety checks.

## 2021-01-11 NOTE — Progress Notes (Signed)
Recreation Therapy Notes  Date: 01/11/2021  Time: 1:00pm     Location: Craft room    Behavioral response: N/A   Intervention Topic: Time Management    Discussion/Intervention: Patient did not attend group.   Clinical Observations/Feedback:  Patient did not attend group.    Malcom Selmer LRT/CTRS          Akasha Melena 01/11/2021 2:39 PM 

## 2021-01-11 NOTE — Progress Notes (Signed)
[facility] MD Progress Note  01/11/2021 12:12 PM Tanya Walker  MRN:  [id number] Subjective: Tanya Walker is seen and examined today.  She had 1 angry outburst over the weekend.  She states that she feels fine today.  She states that she is sleeping and no longer has any suicidal thoughts.  I asked if she was ready to go back to the group home and she said yes.  I changed her Lexapro to Prozac and started Zyprexa.  She seems to be doing a little bit better.  There is no evidence of EPS or TD.  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   Depression    years ago   Headache    sinus headaches   Lipoma of arm    Right   Medical history non-contributory    Osteoporosis    SVD (spontaneous vaginal delivery)    x 2   Varicose vein    Vitamin D deficiency     Past Surgical History:  Procedure Laterality Date   ANTERIOR APPROACH HEMI HIP ARTHROPLASTY Right 09/04/2020   Procedure: ANTERIOR APPROACH HEMI HIP ARTHROPLASTY;  Surgeon: Rod Can, MD;  Location: [facility];  Service: Orthopedics;  Laterality: Right;   [place]   COLONOSCOPY     GANGLION CYST EXCISION     x2 left and right arm   HYSTEROSCOPY WITH D & C N/A 07/15/2013   Procedure: DILATATION AND CURETTAGE /HYSTEROSCOPY;  Surgeon: Lovenia Kim, MD;  Location: [facility];  Service: Gynecology;  Laterality: N/A;   KYPHOPLASTY Bilateral 05/04/2020   Procedure: KYPHOPLASTY THORACIC TWELVE;  Surgeon: Vallarie Mare, MD;  Location: [facility];  Service: Neurosurgery;  Laterality: Bilateral;   MANDIBLE SURGERY  1954   WISDOM TOOTH EXTRACTION     Family History:   Family History  Problem Relation Age of Onset   Hypertension Mother    Heart disease Father    Cancer Father    Family Psychiatric  History: Unremarkable Social History:  Social History   Substance and Sexual Activity  Alcohol Use No     Social History   Substance and Sexual Activity  Drug Use No    Social History   Socioeconomic History   Marital status: Married    Spouse name: Not on file   Number of children: 1   Years of education: Not on file   Highest education level: Not on file  Occupational History   Occupation: retired  Tobacco Use   Smoking status: Never   Smokeless tobacco: Never  Vaping Use   Vaping Use: Never used  Substance and Sexual Activity   Alcohol use: No   Drug use: No   Sexual activity: Yes    Birth control/protection: Post-menopausal  Other Topics Concern   Not on file  Social History Narrative   GI in [place] for colonoscopy   Dr Ronita Hipps - GYN      Denies Surgical history      Family history of varicose veins   Mother is 16      Regular exercise - NO   Social Determinants of Health  Financial Resource Strain: Not on file  Food Insecurity: Not on file  Transportation Needs: Not on file  Physical Activity: Not on file  Stress: Not on file  Social Connections: Not on file   Additional Social History:                         Sleep: Good  Appetite:  Good  Current Medications: Current Facility-Administered Medications  Medication Dose Route Frequency Provider Last Rate Last Admin   acetaminophen (TYLENOL) tablet 1,000 mg  1,000 mg Oral Q6H PRN Starkes-Perry, Gayland Curry, FNP       alum & mag hydroxide-simeth (MAALOX/MYLANTA) 200-200-20 MG/5ML suspension 30 mL  30 mL Oral Q4H PRN Starkes-Perry, Gayland Curry, FNP       aspirin chewable tablet 81 mg  81 mg Oral Daily Suella Broad, FNP   81 mg at 01/11/21 1025   cholecalciferol (VITAMIN D3) tablet 2,000 Units  2,000 Units Oral Daily Suella Broad, FNP   2,000  Units at 01/11/21 1024   docusate sodium (COLACE) capsule 100 mg  100 mg Oral QHS Suella Broad, FNP   100 mg at 01/10/21 2056   feeding supplement (ENSURE ENLIVE / ENSURE PLUS) liquid 237 mL  237 mL Oral BID BM Parks Ranger, DO   237 mL at 01/11/21 1028   FLUoxetine (PROZAC) capsule 20 mg  20 mg Oral QHS Parks Ranger, DO   20 mg at 01/10/21 2056   vitamin B-12 (CYANOCOBALAMIN) tablet 500 mcg  500 mcg Oral Daily Suella Broad, FNP   500 mcg at 19/62/22 9798   And   folic acid (FOLVITE) tablet 0.5 mg  0.5 mg Oral Daily Suella Broad, FNP   0.5 mg at 01/11/21 1024   gabapentin (NEURONTIN) capsule 100 mg  100 mg Oral TID Suella Broad, FNP   100 mg at 01/11/21 1024   loratadine (CLARITIN) tablet 10 mg  10 mg Oral Daily Suella Broad, FNP   10 mg at 01/11/21 1024   magnesium hydroxide (MILK OF MAGNESIA) suspension 30 mL  30 mL Oral Daily PRN Suella Broad, FNP   30 mL at 01/02/21 1722   melatonin tablet 5 mg  5 mg Oral QHS PRN Parks Ranger, DO   5 mg at 01/10/21 2056   multivitamin with minerals tablet 1 tablet  1 tablet Oral Daily Parks Ranger, DO   1 tablet at 01/11/21 1024   OLANZapine (ZYPREXA) tablet 5 mg  5 mg Oral QHS Parks Ranger, DO   5 mg at 01/10/21 2056   polyethylene glycol (MIRALAX / GLYCOLAX) packet 17 g  17 g Oral Daily PRN Suella Broad, FNP   17 g at 01/10/21 2102   senna-docusate (Senokot-S) tablet 2 tablet  2 tablet Oral Daily Suella Broad, FNP   2 tablet at 01/11/21 1025    Lab Results: No results found for this or any previous visit (from the past 48 hour(s)).  Blood Alcohol level:  Lab Results  Component Value Date   ETH <10 92/12/9415    Metabolic Disorder Labs: Lab Results  Component Value Date   HGBA1C 5.5 01/06/2021   MPG 111 01/06/2021   No results found for: PROLACTIN Lab Results  Component Value Date   CHOL 182 01/06/2021   TRIG  37 01/06/2021   HDL 58 01/06/2021   CHOLHDL 3.1 01/06/2021   VLDL 7 01/06/2021   [place]  117 (H) 01/06/2021   LDLCALC 118 (H) 11/01/2019    Physical Findings: AIMS:  , ,  ,  ,    CIWA:    COWS:     Musculoskeletal: Strength & Muscle Tone: within normal limits Gait & Station: normal Patient leans: N/A  Psychiatric Specialty Exam:  Presentation  General Appearance: Appropriate for Environment; Casual  Eye Contact:Fair  Speech:Clear and Coherent; Normal Rate  Speech Volume:Decreased  Handedness:Right   Mood and Affect  Mood:Depressed  Affect:Depressed; Flat   Thought Process  Thought Processes:Coherent; Linear  Descriptions of Associations:Intact  Orientation:Full (Time, Place and Person)  Thought Content:Logical  History of Schizophrenia/Schizoaffective disorder:No  Duration of Psychotic Symptoms:N/A  Hallucinations:No data recorded Ideas of Reference:None  Suicidal Thoughts:No data recorded Homicidal Thoughts:No data recorded  Sensorium  Memory:Immediate Good; Recent Good; Remote Fair  Judgment:Fair  Insight:Fair   Executive Functions  Concentration:Fair  Attention Span:Fair  [place]   Psychomotor Activity  Psychomotor Activity:No data recorded  Assets  Assets:Desire for Improvement; Armed forces logistics/support/administrative officer; Leisure Time; Catering manager; Housing; Physical Health   Sleep  Sleep:No data recorded    Physical Exam Vitals and nursing note reviewed.  Constitutional:      Appearance: She is underweight.  Neurological:     General: No focal deficit present.     Mental Status: She is alert and oriented to person, place, and time.  Psychiatric:        Attention and Perception: Attention and perception normal.        Mood and Affect: Mood is anxious and depressed.        Speech: Speech normal.        Behavior: Behavior is cooperative.        Thought Content: Thought content  is paranoid.        Cognition and Memory: Cognition is impaired. She exhibits impaired recent memory.        Judgment: Judgment normal.   Review of Systems  Constitutional: Negative.   HENT: Negative.    Eyes: Negative.   Respiratory: Negative.    Cardiovascular: Negative.   Gastrointestinal: Negative.   Genitourinary: Negative.   Musculoskeletal: Negative.   Skin: Negative.   Neurological: Negative.   Endo/Heme/Allergies: Negative.   Psychiatric/Behavioral:  Positive for depression.   Blood pressure (!) 141/82, pulse 70, temperature (!) 97.5 F (36.4 C), temperature source Oral, resp. rate 20, height 5\' 2"  (1.575 m), weight 36.6 kg, SpO2 99 %. Body mass index is 14.76 kg/m.   Treatment Plan Summary: Daily contact with patient to assess and evaluate symptoms and progress in treatment, Medication management, and Plan continue current medications.  Social work is working on Dentist.  Parks Ranger, DO 01/11/2021, 12:12 PM

## 2021-01-11 NOTE — Progress Notes (Signed)
Patient is calm and cooperative through the shift. No verbal outburst observed. Assisted with shower. Spend most of the shift in her room, only come out of her room for meals. Patient is currently in her room in bed. Q 15 minute safety checks continue.

## 2021-01-11 NOTE — [facility] Counselor (Signed)
CSW spoke with social worker Susa Griffins,  [phone], to inform her of pt's discharge Thursday or Friday. She stated Thursday would be ideal. She also stated that CSW would need to arrange transportation as facility does not provide transportation as far as [employer].  CSW will contact pt regarding after care and referral for outpatient psychiatry.   Long Brimage Martinique, MSW, LCSW-A 12/5/202212:39 PM

## 2021-01-12 NOTE — Progress Notes (Signed)
[facility] MD Progress Note  01/12/2021 11:49 AM MURIAH HARSHA  MRN:  [id number] Subjective: Dinita has been taking her medications as prescribed and denies any side effects.  She eats and retreats to her room.  She is not as irritable and states that she is sleeping well.  She denies any side effects from her medicine.  There is no evidence of EPS or TD.  She denies any suicidal ideation.  She is okay with discharge soon.  Principal Problem: MDD (major depressive disorder), recurrent episode, severe (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   Depression    years ago   Headache    sinus headaches   Lipoma of arm    Right   Medical history non-contributory    Osteoporosis    SVD (spontaneous vaginal delivery)    x 2   Varicose vein    Vitamin D deficiency     Past Surgical History:  Procedure Laterality Date   ANTERIOR APPROACH HEMI HIP ARTHROPLASTY Right 09/04/2020   Procedure: ANTERIOR APPROACH HEMI HIP ARTHROPLASTY;  Surgeon: Rod Can, MD;  Location: [facility];  Service: Orthopedics;  Laterality: Right;   [place]   COLONOSCOPY     GANGLION CYST EXCISION     x2 left and right arm   HYSTEROSCOPY WITH D & C N/A 07/15/2013   Procedure: DILATATION AND CURETTAGE /HYSTEROSCOPY;  Surgeon: Lovenia Kim, MD;  Location: [facility];  Service: Gynecology;  Laterality: N/A;   KYPHOPLASTY Bilateral 05/04/2020   Procedure: KYPHOPLASTY THORACIC TWELVE;  Surgeon: Vallarie Mare, MD;  Location: [place];  Service: Neurosurgery;  Laterality: Bilateral;   MANDIBLE SURGERY  1954   WISDOM TOOTH EXTRACTION     Family History:  Family History  Problem Relation Age of Onset   Hypertension Mother     Heart disease Father    Cancer Father    Family Psychiatric  History: Unremarkable Social History:  Social History   Substance and Sexual Activity  Alcohol Use No     Social History   Substance and Sexual Activity  Drug Use No    Social History   Socioeconomic History   Marital status: Married    Spouse name: Not on file   Number of children: 1   Years of education: Not on file   Highest education level: Not on file  Occupational History   Occupation: retired  Tobacco Use   Smoking status: Never   Smokeless tobacco: Never  Vaping Use   Vaping Use: Never used  Substance and Sexual Activity   Alcohol use: No   Drug use: No   Sexual activity: Yes    Birth control/protection: Post-menopausal  Other Topics Concern   Not on file  Social History Narrative   GI in [place] for colonoscopy   Dr Ronita Hipps - GYN      Denies Surgical history      Family history of varicose veins   Mother is 59      Regular exercise - NO   Social Determinants of Health   Financial Resource Strain: Not on file  Food Insecurity: Not on file  Transportation Needs: Not on  file  Physical Activity: Not on file  Stress: Not on file  Social Connections: Not on file   Additional Social History:                         Sleep: Good  Appetite:  Good  Current Medications: Current Facility-Administered Medications  Medication Dose Route Frequency Provider Last Rate Last Admin   acetaminophen (TYLENOL) tablet 1,000 mg  1,000 mg Oral Q6H PRN Starkes-Perry, Gayland Curry, FNP       alum & mag hydroxide-simeth (MAALOX/MYLANTA) 200-200-20 MG/5ML suspension 30 mL  30 mL Oral Q4H PRN Starkes-Perry, Gayland Curry, FNP       aspirin chewable tablet 81 mg  81 mg Oral Daily Suella Broad, FNP   81 mg at 01/12/21 3086   cholecalciferol (VITAMIN D3) tablet 2,000 Units  2,000 Units Oral Daily Suella Broad, FNP   2,000 Units at 01/12/21 0943   docusate sodium (COLACE) capsule 100 mg  100 mg  Oral QHS Suella Broad, FNP   100 mg at 01/11/21 2135   feeding supplement (ENSURE ENLIVE / ENSURE PLUS) liquid 237 mL  237 mL Oral BID BM Parks Ranger, DO   237 mL at 01/12/21 0944   FLUoxetine (PROZAC) capsule 20 mg  20 mg Oral QHS Parks Ranger, DO   20 mg at 01/11/21 2135   vitamin B-12 (CYANOCOBALAMIN) tablet 500 mcg  500 mcg Oral Daily Suella Broad, FNP   500 mcg at 57/84/69 6295   And   folic acid (FOLVITE) tablet 0.5 mg  0.5 mg Oral Daily Suella Broad, FNP   0.5 mg at 01/12/21 0943   gabapentin (NEURONTIN) capsule 100 mg  100 mg Oral TID Suella Broad, FNP   100 mg at 01/12/21 0942   loratadine (CLARITIN) tablet 10 mg  10 mg Oral Daily Suella Broad, FNP   10 mg at 01/12/21 0943   magnesium hydroxide (MILK OF MAGNESIA) suspension 30 mL  30 mL Oral Daily PRN Suella Broad, FNP   30 mL at 01/02/21 1722   melatonin tablet 5 mg  5 mg Oral QHS PRN Parks Ranger, DO   5 mg at 01/11/21 2135   multivitamin with minerals tablet 1 tablet  1 tablet Oral Daily Parks Ranger, DO   1 tablet at 01/12/21 0946   OLANZapine (ZYPREXA) tablet 5 mg  5 mg Oral QHS Parks Ranger, DO   5 mg at 01/11/21 2143   polyethylene glycol (MIRALAX / GLYCOLAX) packet 17 g  17 g Oral Daily PRN Suella Broad, FNP   17 g at 01/10/21 2102   senna-docusate (Senokot-S) tablet 2 tablet  2 tablet Oral Daily Suella Broad, FNP   2 tablet at 01/12/21 2841    Lab Results: No results found for this or any previous visit (from the past 48 hour(s)).  Blood Alcohol level:  Lab Results  Component Value Date   ETH <10 32/44/0102    Metabolic Disorder Labs: Lab Results  Component Value Date   HGBA1C 5.5 01/06/2021   MPG 111 01/06/2021   No results found for: PROLACTIN Lab Results  Component Value Date   CHOL 182 01/06/2021   TRIG 37 01/06/2021   HDL 58 01/06/2021   CHOLHDL 3.1 01/06/2021   VLDL 7  01/06/2021   LDLCALC 117 (H) 01/06/2021   LDLCALC 118 (H) 11/01/2019    Physical Findings: AIMS:  , ,  ,  ,  CIWA:    COWS:     Musculoskeletal: Strength & Muscle Tone: within normal limits Gait & Station: unsteady Patient leans: N/A  Psychiatric Specialty Exam:  Presentation  General Appearance: Appropriate for Environment; Casual  Eye Contact:Fair  Speech:Clear and Coherent; Normal Rate  Speech Volume:Decreased  Handedness:Right   Mood and Affect  Mood:Depressed  Affect:Depressed; Flat   Thought Process  Thought Processes:Coherent; Linear  Descriptions of Associations:Intact  Orientation:Full (Time, Place and Person)  Thought Content:Logical  History of Schizophrenia/Schizoaffective disorder:No  Duration of Psychotic Symptoms:N/A  Hallucinations:No data recorded Ideas of Reference:None  Suicidal Thoughts:No data recorded Homicidal Thoughts:No data recorded  Sensorium  Memory:Immediate Good; Recent Good; Remote Fair  Judgment:Fair  Insight:Fair   Executive Functions  Concentration:Fair  Attention Span:Fair  [place]   Psychomotor Activity  Psychomotor Activity:No data recorded  Assets  Assets:Desire for Improvement; Communication Skills; Leisure Time; Catering manager; Housing; Physical Health   Sleep  Sleep:No data recorded    Physical Exam Vitals and nursing note reviewed.  Constitutional:      Appearance: Normal appearance. She is underweight.  Neurological:     General: No focal deficit present.     Mental Status: She is alert and oriented to person, place, and time.  Psychiatric:        Mood and Affect: Mood normal.        Behavior: Behavior normal.   Review of Systems  Constitutional: Negative.   HENT: Negative.    Eyes: Negative.   Respiratory: Negative.    Cardiovascular: Negative.   Gastrointestinal: Negative.   Genitourinary: Negative.    Musculoskeletal: Negative.   Skin: Negative.   Neurological: Negative.   Endo/Heme/Allergies: Negative.   Psychiatric/Behavioral:  Positive for depression.   Blood pressure 114/69, pulse (!) 103, temperature 97.8 F (36.6 C), temperature source Oral, resp. rate 18, height 5\' 2"  (1.575 m), weight 36.6 kg, SpO2 91 %. Body mass index is 14.76 kg/m.   Treatment Plan Summary: Daily contact with patient to assess and evaluate symptoms and progress in treatment, Medication management, and Plan continue current medications.  Social work to plan discharge tomorrow.  Parks Ranger, DO 01/12/2021, 11:49 AM

## 2021-01-12 NOTE — NC FL2 (Signed)
[place] LEVEL OF CARE SCREENING TOOL     IDENTIFICATION  Patient Name: Tanya Walker Birthdate: 29-Aug-1940 Sex: female Admission Date (Current Location): 12/30/2020  [facility] and [place] Number:  Engineering geologist and Address:  [facility], [address] [place], [place] [address]      Provider Number: [id number]  Attending Physician Name and Address:  Parks Ranger,*  Relative Name and Phone Number:  Melora, [place] (Spouse)   [phone]    Current Level of Care: Hospital Recommended Level of Care: [place], [place] Prior Approval Number:    Date Approved/Denied:   PASRR Number:    Discharge Plan: SNF ([place])    Current Diagnoses: Patient Active Problem List   Diagnosis Date Noted   MDD (major depressive disorder), recurrent episode, severe ([place]) 12/29/2020   Suicide attempt ([place])    Hallucination 11/09/2020   Scalp hematoma, subsequent encounter 11/09/2020   Osteoarthritis, multiple sites 09/22/2020   HTN (hypertension) 09/16/2020   Blood loss anemia 09/16/2020   Slow transit constipation 09/16/2020   Closed right hip fracture, initial encounter ([place]) 09/03/2020   Alzheimer disease ([place]) 08/21/2020   Vitamin B12 deficiency 08/21/2020   Hyperlipidemia 08/21/2020   Protein-calorie malnutrition, severe 08/19/2020   Encephalopathy acute 08/19/2020   Altered mental status 08/18/2020   Prolonged QT interval 08/18/2020   Thrombocytopenia ([place]) 08/18/2020   Failure to thrive in adult 08/18/2020   Physical deconditioning 08/18/2020   Compression fracture of body of thoracic vertebra ([place]) 06/08/2020   Low back pain 03/27/2020   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   Pain of left hand 02/14/2019   Tibialis posterior tendinitis, right 07/08/2016   Ankle sprain 06/30/2016   Peripheral vascular disease ([place]) 04/03/2016   Ankle pain, left 03/31/2016   Sore in nose  03/31/2016   Well adult exam 09/25/2015   Adjustment disorder with mixed anxiety and depressed mood 02/03/2015   Allergic rhinitis 05/29/2014   Rash 06/10/2013   URI (upper respiratory infection) 03/08/2012   Lipoma 11/04/2009   PARESTHESIA 12/26/2008   Acute maxillary sinusitis 11/22/2007   Weight loss, abnormal 08/24/2007   Vitamin D deficiency 02/14/2007   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   Osteoporosis 09/01/2006    Orientation RESPIRATION BLADDER Height & Weight     Self, Time, Situation, Place  Normal Continent (Occasionally incontinent, mostly at bedtime) Weight: 80 lb 11 oz (36.6 kg) Height:  5\' 2"  (157.5 cm)  BEHAVIORAL SYMPTOMS/MOOD NEUROLOGICAL BOWEL NUTRITION STATUS  Other (Comment) (Mildly irritable)  (N/A) Continent Supplemental (Ensure)  AMBULATORY STATUS COMMUNICATION OF NEEDS Skin   Independent (Walks with walker assistance) Verbally Other (Comment) (Dry, scaly skin)                       Personal Care Assistance Level of Assistance  Bathing, Dressing Bathing Assistance: Limited assistance   Dressing Assistance: Limited assistance     Functional Limitations Info  Sight Sight Info: Adequate (With glasses)        SPECIAL CARE FACTORS FREQUENCY   (N/A)                    Contractures Contractures Info: Not present    Additional Factors Info  Code Status Code Status Info: Full             Current Medications (01/12/2021):  This is the current hospital active medication list Current Facility-Administered Medications  Medication Dose Route  Frequency Provider Last Rate Last Admin   acetaminophen (TYLENOL) tablet 1,000 mg  1,000 mg Oral Q6H PRN Starkes-Perry, Gayland Curry, FNP       alum & mag hydroxide-simeth (MAALOX/MYLANTA) 200-200-20 MG/5ML suspension 30 mL  30 mL Oral Q4H PRN Starkes-Perry, Gayland Curry, FNP       aspirin chewable tablet 81 mg  81 mg Oral Daily Suella Broad, FNP   81 mg at 01/12/21 7048   cholecalciferol (VITAMIN D3)  tablet 2,000 Units  2,000 Units Oral Daily Suella Broad, FNP   2,000 Units at 01/12/21 8891   docusate sodium (COLACE) capsule 100 mg  100 mg Oral QHS Suella Broad, FNP   100 mg at 01/11/21 2135   feeding supplement (ENSURE ENLIVE / ENSURE PLUS) liquid 237 mL  237 mL Oral BID BM Parks Ranger, DO   237 mL at 01/12/21 1412   FLUoxetine (PROZAC) capsule 20 mg  20 mg Oral QHS Parks Ranger, DO   20 mg at 01/11/21 2135   vitamin B-12 (CYANOCOBALAMIN) tablet 500 mcg  500 mcg Oral Daily Suella Broad, FNP   500 mcg at 69/45/03 8882   And   folic acid (FOLVITE) tablet 0.5 mg  0.5 mg Oral Daily Suella Broad, FNP   0.5 mg at 01/12/21 0943   gabapentin (NEURONTIN) capsule 100 mg  100 mg Oral TID Suella Broad, FNP   100 mg at 01/12/21 0942   loratadine (CLARITIN) tablet 10 mg  10 mg Oral Daily Suella Broad, FNP   10 mg at 01/12/21 8003   magnesium hydroxide (MILK OF MAGNESIA) suspension 30 mL  30 mL Oral Daily PRN Suella Broad, FNP   30 mL at 01/02/21 1722   melatonin tablet 5 mg  5 mg Oral QHS PRN Parks Ranger, DO   5 mg at 01/11/21 2135   multivitamin with minerals tablet 1 tablet  1 tablet Oral Daily Parks Ranger, DO   1 tablet at 01/12/21 0946   OLANZapine (ZYPREXA) tablet 5 mg  5 mg Oral QHS Parks Ranger, DO   5 mg at 01/11/21 2143   polyethylene glycol (MIRALAX / GLYCOLAX) packet 17 g  17 g Oral Daily PRN Suella Broad, FNP   17 g at 01/10/21 2102   senna-docusate (Senokot-S) tablet 2 tablet  2 tablet Oral Daily Suella Broad, FNP   2 tablet at 01/12/21 4917     Discharge Medications: Please see discharge summary for a list of discharge medications.  Relevant Imaging Results:  Relevant Lab Results:   Additional Information    Levy Cedano A [place], LCSWA

## 2021-01-12 NOTE — Progress Notes (Signed)
Recreation Therapy Notes   Date: 01/12/2021  Time: 1:00 pm     Location:  Craft room    Behavioral response: N/A   Intervention Topic: Teamwork   Discussion/Intervention: Patient unable to attend group.  Clinical Observations/Feedback:  Patient unable to attend group.   Sharlot Sturkey LRT/CTRS        Seraphim Affinito 01/12/2021 2:29 PM

## 2021-01-12 NOTE — NC FL2 (Signed)
[place] LEVEL OF CARE SCREENING TOOL     IDENTIFICATION  Patient Name: Tanya Walker Birthdate: Oct 11, 1940 Sex: female Admission Date (Current Location): 12/30/2020  [facility] and [place] Number:  Engineering geologist and Address:  [facility], [address] [place],  [address]      Provider Number: [id number]  Attending Physician Name and Address:  Parks Ranger,*  Relative Name and Phone Number:  Bryan, [place] (Spouse)   [phone]    Current Level of Care: Hospital Recommended Level of Care: [place], [place] Prior Approval Number: [id number]  Date Approved/Denied: 08/19/20 PASRR Number: Change in condition review (PASRR only) pending  Discharge Plan: SNF ([place])    Current Diagnoses: Patient Active Problem List   Diagnosis Date Noted   MDD (major depressive disorder), recurrent episode, severe ([place]) 12/29/2020   Suicide attempt ([place])    Hallucination 11/09/2020   Scalp hematoma, subsequent encounter 11/09/2020   Osteoarthritis, multiple sites 09/22/2020   HTN (hypertension) 09/16/2020   Blood loss anemia 09/16/2020   Slow transit constipation 09/16/2020   Closed right hip fracture, initial encounter ([place]) 09/03/2020   Alzheimer disease ([place]) 08/21/2020   Vitamin B12 deficiency 08/21/2020   Hyperlipidemia 08/21/2020   Protein-calorie malnutrition, severe 08/19/2020   Encephalopathy acute 08/19/2020   Altered mental status 08/18/2020   Prolonged QT interval 08/18/2020   Thrombocytopenia ([place]) 08/18/2020   Failure to thrive in adult 08/18/2020   Physical deconditioning 08/18/2020   Compression fracture of body of thoracic vertebra ([place]) 06/08/2020   Low back pain 03/27/2020   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   Pain of left hand 02/14/2019   Tibialis posterior tendinitis, right 07/08/2016   Ankle sprain 06/30/2016   Peripheral vascular disease  ([place]) 04/03/2016   Ankle pain, left 03/31/2016   Sore in nose 03/31/2016   Well adult exam 09/25/2015   Adjustment disorder with mixed anxiety and depressed mood 02/03/2015   Allergic rhinitis 05/29/2014   Rash 06/10/2013   URI (upper respiratory infection) 03/08/2012   Lipoma 11/04/2009   PARESTHESIA 12/26/2008   Acute maxillary sinusitis 11/22/2007   Weight loss, abnormal 08/24/2007   Vitamin D deficiency 02/14/2007   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   Osteoporosis 09/01/2006    Orientation RESPIRATION BLADDER Height & Weight     Self, Time, Situation, Place  Normal Continent (Occasionally incontinent, mostly at bedtime) Weight: 80 lb 11 oz (36.6 kg) Height:  5\' 2"  (157.5 cm)  BEHAVIORAL SYMPTOMS/MOOD NEUROLOGICAL BOWEL NUTRITION STATUS  Other (Comment) (Mildly irritable)  (N/A) Continent Supplemental (Ensure)  AMBULATORY STATUS COMMUNICATION OF NEEDS Skin   Independent (Walks with walker assistance) Verbally Other (Comment) (Dry, scaly skin)                       Personal Care Assistance Level of Assistance  Bathing, Dressing Bathing Assistance: Limited assistance   Dressing Assistance: Limited assistance     Functional Limitations Info  Sight Sight Info: Adequate (With glasses)        SPECIAL CARE FACTORS FREQUENCY   (N/A)                    Contractures Contractures Info: Not present    Additional Factors Info  Code Status Code Status Info: Full             Current Medications (01/12/2021):  This is the current hospital active medication list Current Facility-Administered Medications  Medication Dose Route Frequency Provider Last Rate Last Admin   acetaminophen (TYLENOL) tablet 1,000 mg  1,000 mg Oral Q6H PRN Starkes-Perry, Gayland Curry, FNP       alum & mag hydroxide-simeth (MAALOX/MYLANTA) 200-200-20 MG/5ML suspension 30 mL  30 mL Oral Q4H PRN Starkes-Perry, Gayland Curry, FNP       aspirin chewable tablet 81 mg  81 mg Oral Daily Suella Broad, FNP   81 mg at 01/12/21 9629   cholecalciferol (VITAMIN D3) tablet 2,000 Units  2,000 Units Oral Daily Suella Broad, FNP   2,000 Units at 01/12/21 5284   docusate sodium (COLACE) capsule 100 mg  100 mg Oral QHS Suella Broad, FNP   100 mg at 01/11/21 2135   feeding supplement (ENSURE ENLIVE / ENSURE PLUS) liquid 237 mL  237 mL Oral BID BM Parks Ranger, DO   237 mL at 01/12/21 1412   FLUoxetine (PROZAC) capsule 20 mg  20 mg Oral QHS Parks Ranger, DO   20 mg at 01/11/21 2135   vitamin B-12 (CYANOCOBALAMIN) tablet 500 mcg  500 mcg Oral Daily Suella Broad, FNP   500 mcg at 13/24/40 1027   And   folic acid (FOLVITE) tablet 0.5 mg  0.5 mg Oral Daily Suella Broad, FNP   0.5 mg at 01/12/21 0943   gabapentin (NEURONTIN) capsule 100 mg  100 mg Oral TID Suella Broad, FNP   100 mg at 01/12/21 0942   loratadine (CLARITIN) tablet 10 mg  10 mg Oral Daily Suella Broad, FNP   10 mg at 01/12/21 2536   magnesium hydroxide (MILK OF MAGNESIA) suspension 30 mL  30 mL Oral Daily PRN Suella Broad, FNP   30 mL at 01/02/21 1722   melatonin tablet 5 mg  5 mg Oral QHS PRN Parks Ranger, DO   5 mg at 01/11/21 2135   multivitamin with minerals tablet 1 tablet  1 tablet Oral Daily Parks Ranger, DO   1 tablet at 01/12/21 0946   OLANZapine (ZYPREXA) tablet 5 mg  5 mg Oral QHS Parks Ranger, DO   5 mg at 01/11/21 2143   polyethylene glycol (MIRALAX / GLYCOLAX) packet 17 g  17 g Oral Daily PRN Suella Broad, FNP   17 g at 01/10/21 2102   senna-docusate (Senokot-S) tablet 2 tablet  2 tablet Oral Daily Suella Broad, FNP   2 tablet at 01/12/21 6440     Discharge Medications: Please see discharge summary for a list of discharge medications.  Relevant Imaging Results:  Relevant Lab Results:   Additional Information    Jannae Fagerstrom A [place], LCSWA

## 2021-01-12 NOTE — BHH Counselor (Signed)
CSW spoke with pt's social worker at [facility], [place],  [phone], regarding discharge scheduled for tomorrow 01/13/21. She stated that would work with her facility.   She requested an updated FL2 for pt. CSW will complete FL2 for discharge.  PASARR information also requested.   CSW informed social worker of outpatient psychiatry appointment and to reschedule if needed.   No other requests made.   Tanya Walker, MSW, LCSW-A 12/6/20221:35 PM

## 2021-01-12 NOTE — Progress Notes (Signed)
Patient remain alert and oriented, ambulatory with front wheel walker. Verbalize depression 8/10, anxiety 8/10. Denies SI, HI, and AVH. Compliant with medications. Ate meals in the day room among staff and peers with good appetite. Supplemental ensure given and tolerated well. Patient is currently in her room resting in no apparent distress at this time. Remain safe on the unit with q 15 minute safety checks.

## 2021-01-13 MED ORDER — FLUOXETINE HCL 20 MG PO CAPS: 20.0000 mg | ORAL_CAPSULE | Freq: Every day | ORAL | 3 refills | Status: DC

## 2021-01-13 MED ORDER — OLANZAPINE 5 MG PO TABS: 5.0000 mg | ORAL_TABLET | Freq: Every day | ORAL | 3 refills | Status: DC

## 2021-01-13 NOTE — Progress Notes (Signed)
Patient compliant with medication, c/o anxiety 9/10 and depression 8/10, prn melatonin for sleep disturbances given and effective this shift. Denies SI/HI/A//VH and verbally contracted for safety. Patient interacted well with Public relations account executive. Complaint with fall protocol. Patient remains safe. Q 15 minutes safety checks ongoing.

## 2021-01-13 NOTE — Progress Notes (Signed)
Patient denies SI, HI, and AVH. She rates depression and anxiety as "very high". Patient came out to the dayroom for her meal, but returned to her room shortly after finishing. Patient denies needing anything for pain. She states that she feels ready to go home. Patient remains safe on the unit at this time.

## 2021-01-13 NOTE — Discharge Summary (Signed)
Physician Discharge Summary Note  Patient:  Tanya Walker is an 80 y.o., female MRN:  [id number] DOB:  [birth year] Patient phone:  [phone] (home)  Patient address:   [place] [address],  Total Time spent with patient: 1 hour  Date of Admission:  12/30/2020 Date of Discharge: 01/13/2021  Reason for Admission:  Patient is a 80 year old white female who presents to [facility] on transfer from [facility] emergency room.  She currently lives in assisted living with her husband.  She has been there for 5 years and states that she does not like it.  She looks like she has lost weight and she states that she has lost weight because she does not like the food.  This is apparently her second suicide attempt.  She does not remember being psychiatrically hospitalized in the past.  She admits to depression and suicide attempt.  She states that currently her medications are up-to-date.  She is able to answer questions appropriately for the most part but has trouble remembering things in her past.  She is able to contract for safety on the inpatient unit.   PER COUNSELOR INTAKE: Spoke with Pt's daughter, Rana Snare [phone].  She says Pt has a history of depression and has been increasingly depressed for approximately six months. She states Pt has spinal fractures which have impaired her ability to be independent. She says this loss of independence, such as not being able to drive, has made Pt more depressed. She says Pt attempted suicide once many years ago by overdosing on medication. She states Pt and Pt's husband have been married 20 years and have a good relationship. He lives in independent living at [facility] and Pt recently moved to assisted living at [facility]. Husband's name is Doranne Schmutz and his cell phone number is [phone]. Ms Autumn Messing says he is currently asleep and it would be best to call him in the morning. Discussed  recommendation for inpatient geriatric-psychiatry and Ms Autumn Messing agreed with plan.    Principal Problem: MDD (major depressive disorder), recurrent episode, severe ([place]) Discharge Diagnoses: Principal Problem:   MDD (major depressive disorder), recurrent episode, severe ([place])   Past Psychiatric History: Not very extensive.  She has never been psychiatrically hospitalized in the past and has not seen a psychiatrist.  It looks like her depression started when she broke her hip.  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   Depression    years ago   Headache    sinus headaches   Lipoma of arm    Right   Medical history non-contributory    Osteoporosis    SVD (spontaneous vaginal delivery)    x 2   Varicose vein    Vitamin D deficiency     Past Surgical History:  Procedure Laterality Date   ANTERIOR APPROACH HEMI HIP ARTHROPLASTY Right 09/04/2020   Procedure: ANTERIOR APPROACH HEMI HIP ARTHROPLASTY;  Surgeon: Rod Can, MD;  Location: [facility];  Service: Orthopedics;  Laterality: Right;   [place]   COLONOSCOPY     GANGLION CYST EXCISION     x2 left and right arm   HYSTEROSCOPY WITH D & C N/A 07/15/2013   Procedure: DILATATION AND CURETTAGE /HYSTEROSCOPY;  Surgeon: Lovenia Kim, MD;  Location:  [facility];  Service: Gynecology;  Laterality: N/A;   KYPHOPLASTY Bilateral 05/04/2020   Procedure: KYPHOPLASTY THORACIC TWELVE;  Surgeon: Vallarie Mare, MD;  Location: [facility];  Service: Neurosurgery;  Laterality: Bilateral;   MANDIBLE SURGERY  1954   WISDOM TOOTH EXTRACTION     Family History:  Family History  Problem Relation Age of Onset   Hypertension Mother    Heart disease Father    Cancer Father    Family Psychiatric  History: Unremarkable Social History:  Social History   Substance and Sexual Activity  Alcohol Use No     Social History    Substance and Sexual Activity  Drug Use No    Social History   Socioeconomic History   Marital status: Married    Spouse name: Not on file   Number of children: 1   Years of education: Not on file   Highest education level: Not on file  Occupational History   Occupation: retired  Tobacco Use   Smoking status: Never   Smokeless tobacco: Never  Vaping Use   Vaping Use: Never used  Substance and Sexual Activity   Alcohol use: No   Drug use: No   Sexual activity: Yes    Birth control/protection: Post-menopausal  Other Topics Concern   Not on file  Social History Narrative   GI in [place] for colonoscopy   Dr Ronita Hipps - GYN      Denies Surgical history      Family history of varicose veins   Mother is 23      Regular exercise - NO   Social Determinants of Health   Financial Resource Strain: Not on file  Food Insecurity: Not on file  Transportation Needs: Not on file  Physical Activity: Not on file  Stress: Not on file  Social Connections: Not on file    Hospital Course: [place] was admitted to the inpatient geriatric psychiatry unit.  She presented very anxious and depressed.  She said that she was suicidal but had no plan.  She was mostly withdrawn while on the unit.  She did have some angry outbursts at times.  After a week she became more pleasant and cooperative.  She took her medications as prescribed and denied any side effects.  There was no evidence of EPS or TD.  Her sleep and appetite slightly improved, although she did lose some weight while on the unit.  She was receiving supplemental Ensure.  Her Lexapro was discontinued and she was started on Prozac.  Zyprexa was added at 5 mg at bedtime.  She tolerated the medications.  On the day of discharge she denied suicidal ideation, homicidal ideation, or auditory or visual hallucinations.  Physical Findings: AIMS:  , ,  ,  ,    CIWA:    COWS:     Musculoskeletal: Strength & Muscle Tone: decreased Gait & Station:  unsteady Patient leans: N/A   Psychiatric Specialty Exam:  Presentation  General Appearance: Appropriate for Environment; Casual  Eye Contact:Fair  Speech:Clear and Coherent; Normal Rate  Speech Volume:Decreased  Handedness:Right   Mood and Affect  Mood:Depressed  Affect:Depressed; Flat   Thought Process  Thought Processes:Coherent; Linear  Descriptions of Associations:Intact  Orientation:Full (Time, Place and Person)  Thought Content:Logical  History of Schizophrenia/Schizoaffective disorder:No  Duration of Psychotic Symptoms:N/A  Hallucinations:No data recorded Ideas of Reference:None  Suicidal Thoughts:No data recorded Homicidal Thoughts:No data recorded  Sensorium  Memory:Immediate Good; Recent Good; Remote Fair  Judgment:Fair  Insight:Fair   Executive  Functions  Concentration:Fair  Attention Span:Fair  [place]   Psychomotor Activity  Psychomotor Activity:No data recorded  Assets  Assets:Desire for Improvement; Communication Skills; Leisure Time; Catering manager; Housing; Physical Health   Sleep  Sleep:No data recorded    Physical Exam Vitals and nursing note reviewed.  Constitutional:      Appearance: Normal appearance. She is underweight.  HENT:     Head: Normocephalic and atraumatic.     Mouth/Throat:     Pharynx: Oropharynx is clear.  Eyes:     Pupils: Pupils are equal, round, and reactive to light.  Cardiovascular:     Rate and Rhythm: Normal rate and regular rhythm.  Pulmonary:     Effort: Pulmonary effort is normal.     Breath sounds: Normal breath sounds.  Abdominal:     General: Abdomen is flat.     Palpations: Abdomen is soft.  Musculoskeletal:        General: Normal range of motion.     Cervical back: Normal range of motion.  Skin:    General: Skin is warm and dry.  Neurological:     General: No focal deficit present.     Mental Status: She is  alert. Mental status is at baseline.  Psychiatric:        Attention and Perception: Attention and perception normal.        Mood and Affect: Mood is anxious. Affect is flat.        Speech: Speech normal.        Behavior: Behavior is cooperative.        Thought Content: Thought content is paranoid.        Cognition and Memory: Cognition and memory normal.        Judgment: Judgment normal.   Review of Systems  Constitutional: Negative.   HENT: Negative.    Eyes: Negative.   Respiratory: Negative.    Cardiovascular: Negative.   Gastrointestinal: Negative.   Genitourinary: Negative.   Musculoskeletal: Negative.   Skin: Negative.   Neurological: Negative.   Endo/Heme/Allergies: Negative.   Psychiatric/Behavioral:  Positive for depression.   Blood pressure (!) 109/53, pulse 68, temperature 98.7 F (37.1 C), temperature source Oral, resp. rate 20, height 5\' 2"  (1.575 m), weight 36.6 kg, SpO2 100 %. Body mass index is 14.76 kg/m.   Social History   Tobacco Use  Smoking Status Never  Smokeless Tobacco Never   Tobacco Cessation:  N/A, patient does not currently use tobacco products   Blood Alcohol level:  Lab Results  Component Value Date   ETH <10 55/97/4163    Metabolic Disorder Labs:  Lab Results  Component Value Date   HGBA1C 5.5 01/06/2021   MPG 111 01/06/2021   No results found for: PROLACTIN Lab Results  Component Value Date   CHOL 182 01/06/2021   TRIG 37 01/06/2021   HDL 58 01/06/2021   CHOLHDL 3.1 01/06/2021   VLDL 7 01/06/2021   LDLCALC 117 (H) 01/06/2021   LDLCALC 118 (H) 11/01/2019    See Psychiatric Specialty Exam and Suicide Risk Assessment completed by Attending Physician prior to discharge.  Discharge destination: Nursing facility: [facility] [address] in [place].  Is patient on multiple antipsychotic therapies at discharge:  No   Has Patient had three or more failed trials of antipsychotic monotherapy by history:   No     Allergies as of 01/13/2021       Reactions   Alendronate Sodium  Other (See Comments)   Leg cramps and "Allergic," per MAR   Risedronate Sodium Other (See Comments)   Caused the patient to be achy and is "Allergic," per MAR   Tramadol Other (See Comments)   Caused the patient to feel badly    Keflex [cephalexin] Other (See Comments)   Possible rash        Medication List     STOP taking these medications    escitalopram 10 MG tablet Commonly known as: LEXAPRO       TAKE these medications      Indication  acetaminophen 500 MG tablet Commonly known as: TYLENOL Take 1,000 mg by mouth every 6 (six) hours as needed for moderate pain, mild pain, headache or fever.    ASPIRIN 81 PO Take 1 tablet by mouth daily.    CALCIUM-CARB 600 PO Take 1 tablet by mouth 2 (two) times daily.    cetirizine 10 MG tablet Commonly known as: ZyrTEC Allergy Take 1 tablet (10 mg total) by mouth daily.    docusate sodium 100 MG capsule Commonly known as: COLACE Take 100 mg by mouth at bedtime.    FLUoxetine 20 MG capsule Commonly known as: PROZAC Take 1 capsule (20 mg total) by mouth at bedtime.  Indication: Depression   gabapentin 100 MG capsule Commonly known as: NEURONTIN Take 1 capsule (100 mg total) by mouth 3 (three) times daily.    melatonin 5 MG Tabs Take 5 mg by mouth at bedtime.    Nizoral A-D 1 % Sham Generic drug: KETOCONAZOLE (TOPICAL) Apply 1 application topically once a week. Wednesday    OLANZapine 5 MG tablet Commonly known as: ZYPREXA Take 1 tablet (5 mg total) by mouth at bedtime.  Indication: Anorexia Nervosa   polyethylene glycol 17 g packet Commonly known as: MIRALAX / GLYCOLAX Take 17 g by mouth daily as needed for moderate constipation or mild constipation. What changed: when to take this    PRESERVISION AREDS 2 PO Take 1 capsule by mouth 2 (two) times daily.    RESOURCE 2.0 PO Take 180 mLs by mouth 2 (two) times daily.    Salonpas  3.02-13-08 % Ptch Generic drug: Camphor-Menthol-Methyl Sal Apply 1 patch topically See admin instructions. Apply 1 patch to the lower back and remove (at most) after 8 hours- once a day as needed for pain    senna-docusate 8.6-50 MG tablet Commonly known as: Senokot-S Take 2 tablets by mouth daily.    sodium phosphate 7-19 GM/118ML Enem Place 1 enema rectally daily as needed for severe constipation or mild constipation.    VITAMIN B12-FOLIC ACID PO Place 1 lozenge under the tongue in the morning.    Vitamin D3 50 MCG (2000 UT) capsule Take 1 capsule (2,000 Units total) by mouth daily.         Follow-up Information     [facility] Follow up on 01/20/2021.   Why: You have an appointment scheduled for Wednesday 12/14 at 2pm in person with the provider Barbee Shropshire for medication management. Please contact practice to reschedule appointment if needed. Thanks! Contact information: Address: [address], [place], [place] [address] Phone: [phone] Fax: [phone]                Follow-up recommendations: As above, [facility] on 01/20/2021.   Signed: [place], DO 01/13/2021, 10:33 AM

## 2021-01-13 NOTE — Progress Notes (Signed)
  [facility] Adult Case Management Discharge Plan :  Will you be returning to the same living situation after discharge:  Yes,  pt will be returning to her assisted living facility At discharge, do you have transportation home?: Yes,  CSW will assist pt in arranging transportation Do you have the ability to pay for your medications: Yes,  pt has Medicare Part A & B  Release of information consent forms completed and in the chart;  Patient's signature needed at discharge.  Patient to Follow up at:  Follow-up Information     [facility] Follow up on 01/20/2021.   Why: You have an appointment scheduled for Wednesday 12/14 at 2pm in person with the provider Barbee Shropshire for medication management. Please contact practice to reschedule appointment if needed. Thanks! Contact information: Address: [address], [place], [place] [address] Phone: [phone] Fax: [phone]                Next level of care provider has access to [facility] and Suicide Prevention discussed: Yes,  completed with pt     Has patient been referred to the Quitline?: N/A patient is not a smoker  Patient has been referred for addiction treatment: N/A  Tanya Walker, [place] 01/13/2021, 10:16 AM

## 2021-01-13 NOTE — [facility] Suicide Risk Assessment (Signed)
[facility] Admission Suicide Risk Assessment   Nursing information obtained from:  Patient Demographic factors:  Age 80 or older Current Mental Status:  Self-harm thoughts Loss Factors:  NA Historical Factors:  Prior suicide attempts Risk Reduction Factors:  Positive social support  Total Time spent with patient: 1 hour Principal Problem: MDD (major depressive disorder), recurrent episode, severe ([place]) Diagnosis:  Principal Problem:   MDD (major depressive disorder), recurrent episode, severe ([place])  Subjective Data: Tanya Walker has been taking her medications as prescribed and denies any side effects.  She eats and retreats to her room.  She is not as irritable and states that she is sleeping well.  She denies any side effects from her medicine.  There is no evidence of EPS or TD.  She denies any suicidal ideation.  She is okay with discharge soon.  Continued Clinical Symptoms:  Alcohol Use Disorder Identification Test Final Score (AUDIT): 0 The "Alcohol Use Disorders Identification Test", Guidelines for Use in Primary Care, Second Edition.  World Pharmacologist [facility]). Score between 0-7:  no or low risk or alcohol related problems. Score between 8-15:  moderate risk of alcohol related problems. Score between 16-19:  high risk of alcohol related problems. Score 20 or above:  warrants further diagnostic evaluation for alcohol dependence and treatment.   CLINICAL FACTORS:   Severe Anxiety and/or Agitation   Musculoskeletal: Strength & Muscle Tone: decreased Gait & Station: unsteady Patient leans: N/A  Psychiatric Specialty Exam:  Presentation  General Appearance: Appropriate for Environment; Casual  Eye Contact:Fair  Speech:Clear and Coherent; Normal Rate  Speech Volume:Decreased  Handedness:Right   Mood and Affect  Mood:Depressed  Affect:Depressed; Flat   Thought Process  Thought Processes:Coherent; Linear  Descriptions of Associations:Intact  Orientation:Full (Time,  Place and Person)  Thought Content:Logical  History of Schizophrenia/Schizoaffective disorder:No  Duration of Psychotic Symptoms:N/A  Hallucinations:No data recorded Ideas of Reference:None  Suicidal Thoughts:No data recorded Homicidal Thoughts:No data recorded  Sensorium  Memory:Immediate Good; Recent Good; Remote Fair  Judgment:Fair  Insight:Fair   Executive Functions  Concentration:Fair  Attention Span:Fair  [place]   Psychomotor Activity  Psychomotor Activity:No data recorded  Assets  Assets:Desire for Improvement; Armed forces logistics/support/administrative officer; Leisure Time; Catering manager; Housing; Physical Health   Sleep  Sleep:No data recorded   Physical Exam: Physical Exam Vitals and nursing note reviewed.  Constitutional:      Appearance: Normal appearance. She is underweight.  Neurological:     General: No focal deficit present.     Mental Status: She is alert and oriented to person, place, and time.  Psychiatric:        Attention and Perception: Attention and perception normal.        Mood and Affect: Mood is anxious. Affect is flat.        Speech: Speech normal.        Behavior: Behavior normal.        Thought Content: Thought content normal.        Cognition and Memory: Cognition is impaired. She exhibits impaired remote memory.        Judgment: Judgment normal.   Review of Systems  Constitutional: Negative.   HENT: Negative.    Eyes: Negative.   Respiratory: Negative.    Cardiovascular: Negative.   Gastrointestinal: Negative.   Genitourinary: Negative.   Musculoskeletal: Negative.   Skin: Negative.   Neurological: Negative.   Endo/Heme/Allergies: Negative.   Psychiatric/Behavioral:  Positive for depression.   Blood pressure (!) 109/53, pulse  68, temperature 98.7 F (37.1 C), temperature source Oral, resp. rate 20, height 5\' 2"  (1.575 m), weight 36.6 kg, SpO2 100 %. Body mass index is 14.76  kg/m.   COGNITIVE FEATURES THAT CONTRIBUTE TO RISK:  Closed-mindedness    SUICIDE RISK:   Minimal: No identifiable suicidal ideation.  Patients presenting with no risk factors but with morbid ruminations; may be classified as minimal risk based on the severity of the depressive symptoms  PLAN OF CARE: Discharge back to Nursing facility  I certify that inpatient services furnished can reasonably be expected to improve the patient's condition.   [name], DO 01/13/2021, 10:12 AM

## 2021-01-13 NOTE — Group Note (Signed)
[facility] LCSW Group Therapy Note   Group Date: 01/13/2021 Start Time: 1045 End Time: 1100   Type of Therapy/Topic:  Group Therapy:  Emotion Regulation  Participation Level:  Did Not Attend    Description of Group:    The purpose of this group is to assist patients in learning to regulate negative emotions and experience positive emotions. Patients will be guided to discuss ways in which they have been vulnerable to their negative emotions. These vulnerabilities will be juxtaposed with experiences of positive emotions or situations, and patients challenged to use positive emotions to combat negative ones. Special emphasis will be placed on coping with negative emotions in conflict situations, and patients will process healthy conflict resolution skills.  Therapeutic Goals: Patient will identify two positive emotions or experiences to reflect on in order to balance out negative emotions:  Patient will label two or more emotions that they find the most difficult to experience:  Patient will be able to demonstrate positive conflict resolution skills through discussion or role plays:   Summary of Patient Progress:   X    Therapeutic Modalities:   Cognitive Behavioral Therapy Feelings Identification Dialectical Behavioral Therapy   Tanya Walker, LCSWA 

## 2021-01-13 NOTE — Progress Notes (Signed)
Pt was educated on prescriptions and follow up care. Pt questions were answered and pt verbalized understanding and did not voice any concerns. Pt's belongings were returned. Pt was safely discharged to [facility].

## 2021-01-14 ENCOUNTER — Encounter: Payer: Self-pay | Admitting: Nurse Practitioner

## 2021-01-14 ENCOUNTER — Non-Acute Institutional Stay (SKILLED_NURSING_FACILITY): Payer: Medicare Other | Admitting: Nurse Practitioner

## 2021-01-14 DIAGNOSIS — M545 Low back pain, unspecified: Secondary | ICD-10-CM | POA: Diagnosis not present

## 2021-01-14 DIAGNOSIS — M5459 Other low back pain: Secondary | ICD-10-CM | POA: Diagnosis not present

## 2021-01-14 DIAGNOSIS — Z9181 History of falling: Secondary | ICD-10-CM | POA: Diagnosis not present

## 2021-01-14 DIAGNOSIS — T1491XA Suicide attempt, initial encounter: Secondary | ICD-10-CM | POA: Diagnosis not present

## 2021-01-14 DIAGNOSIS — I10 Essential (primary) hypertension: Secondary | ICD-10-CM

## 2021-01-14 DIAGNOSIS — R45851 Suicidal ideations: Secondary | ICD-10-CM

## 2021-01-14 DIAGNOSIS — K5901 Slow transit constipation: Secondary | ICD-10-CM

## 2021-01-14 DIAGNOSIS — G309 Alzheimer's disease, unspecified: Secondary | ICD-10-CM

## 2021-01-14 DIAGNOSIS — E559 Vitamin D deficiency, unspecified: Secondary | ICD-10-CM | POA: Diagnosis not present

## 2021-01-14 DIAGNOSIS — D696 Thrombocytopenia, unspecified: Secondary | ICD-10-CM

## 2021-01-14 DIAGNOSIS — D500 Iron deficiency anemia secondary to blood loss (chronic): Secondary | ICD-10-CM

## 2021-01-14 DIAGNOSIS — R2689 Other abnormalities of gait and mobility: Secondary | ICD-10-CM | POA: Diagnosis not present

## 2021-01-14 DIAGNOSIS — S72011D Unspecified intracapsular fracture of right femur, subsequent encounter for closed fracture with routine healing: Secondary | ICD-10-CM | POA: Diagnosis not present

## 2021-01-14 DIAGNOSIS — E538 Deficiency of other specified B group vitamins: Secondary | ICD-10-CM

## 2021-01-14 DIAGNOSIS — J3089 Other allergic rhinitis: Secondary | ICD-10-CM | POA: Diagnosis not present

## 2021-01-14 DIAGNOSIS — F32A Depression, unspecified: Secondary | ICD-10-CM | POA: Diagnosis not present

## 2021-01-14 DIAGNOSIS — M6281 Muscle weakness (generalized): Secondary | ICD-10-CM | POA: Diagnosis not present

## 2021-01-14 DIAGNOSIS — F339 Major depressive disorder, recurrent, unspecified: Secondary | ICD-10-CM | POA: Diagnosis not present

## 2021-01-14 DIAGNOSIS — F0280 Dementia in other diseases classified elsewhere without behavioral disturbance: Secondary | ICD-10-CM

## 2021-01-14 DIAGNOSIS — R41841 Cognitive communication deficit: Secondary | ICD-10-CM | POA: Diagnosis not present

## 2021-01-14 NOTE — Assessment & Plan Note (Signed)
Hgb 12 12/27/20

## 2021-01-14 NOTE — Progress Notes (Addendum)
Location:   [facility] Room Number: 30 A Place of Service:  SNF (31) Provider:  Kensi Karr X  Walker, [name], MD  Patient Care Team: Cassandria Anger, MD as PCP - General (Internal Medicine)  Extended Emergency Contact Information Primary Emergency Contact: Tanya, Walker Address: [address]          [place], [place] [address] [place] Phone: [phone] Relation: Spouse Secondary Emergency Contact: [facility] Phone: [phone] Relation: Daughter  Code Status:  FULL CODE Goals of care: Advanced Directive information Advanced Directives 02/12/2021  Does Patient Have a Medical Advance Directive? No  Type of Advance Directive -  Does patient want to make changes to medical advance directive? -  Copy of  [place] in Chart? -  Would patient like information on creating a medical advance directive? -  Some encounter information is confidential and restricted. Go to Review Flowsheets activity to see all data.     Chief Complaint  Patient presents with   Hospitalization Follow-up    Hospitalization follow up for medication review    HPI:  Pt is a 80 y.o. female seen today for an acute visit for medication review following hospital stay.   Hospitalized 12/30/20-01/13/21 for inpatient geriatric psychiatry due to suicidal attempt. Hx of suicidal attempt many years ago by overdosing on medication. No suicidal ideation, homicidal ideation, auditory, or visual hallucination.    Hallucination, Hx of, seeing little boys in her room, water coming from the ceiling in her room, left occiput hematoma sustained from fall 11/05/20, healed. CT 11/10/20 no acute intracranial abnormality.              08/18/20-08/20/20 for AMS/lethargy, didn't tolerate Mirtazapine for appetite. CT head/abd/pelvis showed stable T11, L1 compression fx. 09/03/20-09/07/20 for s/p R hip hemiarthroplasty due to a mechanical fall, healed.               OP, off Evenity 2nd to cost              Anemia, Hgb 12 12/27/20             HTN, off Amlodipine. Bun/creat 29/0.52 12/27/20             Constipation, takes Senokot S, MiraLax.              Alzheimer's Dementia, Vit B12 499, TSH 2.12 05/21/20, SNF [facility] for supportive care. MMSE 28/30 08/26/20             Vit B12 deficiency, Vit B12 level 499 05/21/20, takes Vit B12             Vitamin D deficiency, takes Vit d daily             Allergic rhinitis, takes Zyrtec.             Hyperlipidemia, diet, LDL 117 01/06/21             Depression, suicidal attempt recently, on Prozac and Zyprexa, off Escitalopram, HPOA requested taper off Wellbutrin in the past, Mirtazapine made her AMS             Chronic back pain, stable, T11, L1 compression fx, takes Tylenol, Gabapentin             Weight loss/adult failure to thrive, didn't tolerate Mirtazapine.             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   Depression    years ago   Headache    sinus headaches   Lipoma of arm    Right   Medical history non-contributory    Osteoporosis    SVD (spontaneous vaginal delivery)    x 2   Varicose vein    Vitamin D deficiency    Past Surgical History:  Procedure Laterality Date   ANTERIOR APPROACH HEMI HIP ARTHROPLASTY Right 09/04/2020   Procedure: ANTERIOR APPROACH HEMI HIP ARTHROPLASTY;  Surgeon: Rod Can, MD;  Location: [facility];  Service: Orthopedics;  Laterality: Right;   [place]   COLONOSCOPY     GANGLION CYST EXCISION     x2 left and right arm   HYSTEROSCOPY WITH D & C N/A 07/15/2013   Procedure: DILATATION AND CURETTAGE /HYSTEROSCOPY;  Surgeon: Lovenia Kim, MD;  Location: [facility];  Service: Gynecology;  Laterality: N/A;   KYPHOPLASTY Bilateral 05/04/2020   Procedure: KYPHOPLASTY THORACIC TWELVE;  Surgeon: Vallarie Mare, MD;  Location: [facility];   Service: Neurosurgery;  Laterality: Bilateral;   MANDIBLE SURGERY  1954   WISDOM TOOTH EXTRACTION      Allergies  Allergen Reactions   Alendronate Sodium Other (See Comments)    Leg cramps and "Allergic," per MAR   Risedronate Sodium Other (See Comments)    Caused the patient to be achy and is "Allergic," per MAR   Tramadol Other (See Comments)    Caused the patient to feel badly    Keflex [Cephalexin] Other (See Comments)    Possible rash    Allergies as of 01/14/2021       Reactions   Alendronate Sodium Other (See Comments)   Leg cramps and "Allergic," per MAR   Risedronate Sodium Other (See Comments)   Caused the patient to be achy and is "Allergic," per MAR   Tramadol Other (See Comments)   Caused the patient to feel badly    Keflex [cephalexin] Other (See Comments)   Possible rash        Medication List        Accurate as of January 14, 2021 11:59 PM. If you have any questions, ask your nurse or doctor.          acetaminophen 500 MG tablet Commonly known as: TYLENOL Take 1,000 mg by mouth every 6 (six) hours as needed for moderate pain, mild pain, headache or fever.   ASPIRIN 81 PO Take 1 tablet by mouth daily.   CALCIUM-CARB 600 PO Take 1 tablet by mouth 2 (two) times daily.   cetirizine 10 MG tablet Commonly known as: ZyrTEC Allergy Take 1 tablet (10 mg total) by mouth daily.   docusate sodium 100 MG capsule Commonly known as: COLACE Take 100 mg by mouth at bedtime.   FLUoxetine 20 MG capsule Commonly known as: PROZAC Take 1 capsule (20 mg total) by mouth at bedtime.   gabapentin 100 MG capsule Commonly known as: NEURONTIN Take 1 capsule (100 mg total) by mouth 3 (three) times daily.   melatonin 5 MG Tabs Take 5 mg by mouth at bedtime.   Nizoral A-D 1 % Sham Generic drug: KETOCONAZOLE (TOPICAL) Apply 1 application topically once a week. Wednesday   OLANZapine 5 MG tablet Commonly known as: ZYPREXA Take 1 tablet (5 mg total) by mouth  at bedtime.   polyethylene glycol 17 g  packet Commonly known as: MIRALAX / GLYCOLAX Take 17 g by mouth daily as needed for moderate constipation or mild constipation. What changed: when to take this   PRESERVISION AREDS 2 PO Take 1 capsule by mouth 2 (two) times daily.   RESOURCE 2.0 PO Take 180 mLs by mouth 2 (two) times daily.   Salonpas 3.02-13-08 % Ptch Generic drug: Camphor-Menthol-Methyl Sal Apply 1 patch topically See admin instructions. Apply 1 patch to the lower back and remove (at most) after 8 hours- once a day as needed for pain   senna-docusate 8.6-50 MG tablet Commonly known as: Senokot-S Take 2 tablets by mouth daily.   sodium phosphate 7-19 GM/118ML Enem Place 1 enema rectally daily as needed for severe constipation or mild constipation.   VITAMIN B12-FOLIC ACID PO Place 1 lozenge under the tongue in the morning.   Vitamin D3 50 MCG (2000 UT) capsule Take 1 capsule (2,000 Units total) by mouth daily.        Review of Systems  Constitutional:  Negative for fatigue, fever and unexpected weight change.       #80Ibs  HENT:  Positive for hearing loss. Negative for congestion and voice change.   Eyes:  Negative for visual disturbance.  Respiratory:  Negative for cough and shortness of breath.   Cardiovascular:  Negative for leg swelling.  Gastrointestinal:  Negative for abdominal pain and constipation.  Genitourinary:  Negative for dysuria and urgency.  Musculoskeletal:  Positive for arthralgias, back pain and gait problem.       R hip pain is minimal.   Skin:  Negative for color change.  Neurological:  Negative for speech difficulty, weakness and headaches.       Dementia.   Psychiatric/Behavioral:  Negative for confusion and hallucinations. The patient is not nervous/anxious.    Immunization History  Administered Date(s) Administered   DT (Pediatric) 04/10/2002   Fluad Quad(high Dose 65+) 10/28/2019   Influenza Split 03/08/2011, 11/17/2011   Influenza  Whole 01/09/2007, 11/05/2008, 11/04/2009   Influenza, High Dose Seasonal PF 12/07/2015, 09/29/2016, 11/09/2017, 09/27/2018, 10/28/2019   Influenza-Unspecified 10/30/2012, 11/29/2013, 12/02/2014, 11/25/2020   Moderna Sars-Covid-2 Vaccination 02/08/2019, 03/11/2019   PFIZER(Purple Top)SARS-COV-2 Vaccination 10/09/2019   PPD Test 12/22/2015, 01/05/2016   Pneumococcal Conjugate-13 07/17/2014   Pneumococcal Polysaccharide-23 08/15/2005, 09/25/2015   Tetanus 12/24/2012   Unspecified SARS-COV-2 Vaccination 02/11/2019, 12/17/2019, 10/27/2020   Zoster Recombinat (Shingrix) 06/07/2019, 09/06/2019   Zoster, Live 12/05/2013   Pertinent  Health Maintenance Due  Topic Date Due   INFLUENZA VACCINE  Completed   DEXA SCAN  Completed   Fall Risk 12/27/2020 12/28/2020 12/28/2020 12/29/2020 12/30/2020  Falls in the past year? - - - - -  Was there an injury with Fall? - - - - -  Fall Risk Category Calculator - - - - -  Fall Risk Category - - - - -  Patient Fall Risk Level Moderate fall risk Moderate fall risk High fall risk High fall risk High fall risk  Patient at Risk for Falls Due to - - - - -  Fall risk Follow up - - - - -  Some encounter information is confidential and restricted. Go to Review Flowsheets activity to see all data.   Functional Status Survey:    Vitals:   01/14/21 1114  BP: 132/65  Pulse: 63  Resp: 16  Temp: (!) 97 F (36.1 C)  SpO2: 97%  Weight: 82 lb 9.6 oz (37.5 kg)  Height: 5\' 2"  (1.575 m)   Body mass  index is 15.11 kg/m. Physical Exam Vitals and nursing note reviewed.  Constitutional:      Appearance: Normal appearance.  HENT:     Head: Normocephalic.     Mouth/Throat:     Mouth: Mucous membranes are moist.  Eyes:     Conjunctiva/sclera: Conjunctivae normal.     Pupils: Pupils are equal, round, and reactive to light.  Cardiovascular:     Rate and Rhythm: Normal rate.     Heart sounds: No murmur heard. Pulmonary:     Effort: Pulmonary effort is normal.      Breath sounds: No rales.  Abdominal:     General: Bowel sounds are normal.     Palpations: Abdomen is soft.     Tenderness: There is no abdominal tenderness.  Musculoskeletal:     Cervical back: Normal range of motion and neck supple.     Right lower leg: No edema.     Left lower leg: No edema.     Comments: Right hip s/p ORIF 09/08/20, healed.   Skin:    General: Skin is warm and dry.  Neurological:     General: No focal deficit present.     Mental Status: She is alert and oriented to person, place, and time. Mental status is at baseline.     Motor: No weakness.     Coordination: Coordination normal.     Gait: Gait abnormal.  Psychiatric:        Mood and Affect: Mood normal.     Comments: Sad facial looks.     Labs reviewed: Recent Labs    08/18/20 2330 08/19/20 0429 09/03/20 0716 09/05/20 0537 09/06/20 0533 09/18/20 0000 09/22/20 0000 11/11/20 0000 12/27/20 1410  NA  --  137   < > 133* 135   < > 138 139 140  K  --  3.6   < > 3.2* 3.5   < > 4.1 4.1 4.1  CL  --  104   < > 101 102   < > 100 104 105  CO2  --  24  --  25 27   < > 27* 28* 31  GLUCOSE  --  80   < > 110* 95  --   --   --  102*  BUN  --  10   < > 23 18   < > 18 18 29*  CREATININE  --  <0.30*   < > 0.59 0.40*   < > 0.5 0.6 0.52  CALCIUM  --  8.6*  --  8.6* 8.7*   < > 8.5* 8.6* 8.8*  MG 2.2 2.1  --   --   --   --   --   --   --   PHOS  --  3.1  --   --   --   --   --   --   --    < > = values in this interval not displayed.   Recent Labs    08/18/20 1622 08/19/20 0429 09/18/20 0000 09/22/20 0000 11/11/20 0000 12/27/20 1410  AST 25 22 21 26 14 17   ALT 18 18 22 28 9 15   ALKPHOS 91 86 100 112 57 53  BILITOT 0.9 1.0  --   --   --  0.4  PROT 6.3* 6.4*  --   --   --  6.0*  ALBUMIN 3.7 3.9 3.1* 3.0*  --  3.3*   Recent Labs    09/06/20 0533 09/07/20  0400 09/18/20 0000 09/22/20 0000 11/11/20 0000 12/27/20 1410  WBC 9.6 8.1   < > 5.7 4.1 4.3  NEUTROABS  --   --    < > 4,252.00 2,362.00 2.3  HGB  11.1* 10.1*   < > 11.3* 10.6* 12.0  HCT 34.1* 30.1*   < > 35* 33* 36.8  MCV 93.2 92.0  --   --   --  95.8  PLT 121* 136*  --  342 136* 158   < > = values in this interval not displayed.   Lab Results  Component Value Date   TSH 2.12 05/21/2020   Lab Results  Component Value Date   HGBA1C 5.5 01/06/2021   Lab Results  Component Value Date   CHOL 182 01/06/2021   HDL 58 01/06/2021   LDLCALC 117 (H) 01/06/2021   LDLDIRECT 135.4 12/26/2008   TRIG 37 01/06/2021   CHOLHDL 3.1 01/06/2021    Significant Diagnostic Results in last 30 days:  No results found.  Assessment/Plan Suicide attempt [facility]) Hospitalized 12/30/20-01/13/21 for inpatient geriatric psychiatry due to suicidal attempt. Hx of suicidal attempt many years ago by overdosing on medication. No suicidal ideation, homicidal ideation, auditory, or visual hallucination.   Blood loss anemia Hgb 12 12/27/20  HTN (hypertension) Blood pressure is controlled, off Amlodipine. Bun/creat 29/0.52 12/27/20  Slow transit constipation Stable, takes Senokot S, MiraLax.   Alzheimer disease ([place]) Vit B12 499, TSH 2.12 05/21/20, SNF [facility] for supportive care 08/26/20 MMSE 28/30  Vitamin B12 deficiency  Vit B12 level 499 05/21/20, takes Vit B12  Vitamin D deficiency  takes Vit d daily  Allergic rhinitis takes Zyrtec.  Depression with suicidal ideation suicidal attempt recently, on Prozac and Zyprexa, off Escitalopram, HPOA requested taper off Wellbutrin in the past, Mirtazapine made her AMS.  During today's visit: the patient stated her mattress is not comfortable, cannot rush her appetite, wants to go back to her apartment, conversing, smiled a little, denied suicidal ideation.  Low back pain stable, T11, L1 compression fx, takes Tylenol, Gabapentin  Thrombocytopenia (HCC) plt 158 12/27/20      Family/ staff Communication: plan of care reviewed with the patient, social worker, and Engineer, site.   Labs/tests ordered:    none  Time spend 35 minutes

## 2021-01-14 NOTE — Assessment & Plan Note (Signed)
suicidal attempt recently, on Prozac and Zyprexa, off Escitalopram, HPOA requested taper off Wellbutrin in the past, Mirtazapine made her AMS.  During today's visit: the patient stated her mattress is not comfortable, cannot rush her appetite, wants to go back to her apartment, conversing, smiled a little, denied suicidal ideation.

## 2021-01-14 NOTE — Assessment & Plan Note (Signed)
Vit B12 499, TSH 2.12 05/21/20, SNF [facility] for supportive care 08/26/20 MMSE 28/30

## 2021-01-14 NOTE — Assessment & Plan Note (Signed)
Stable, takes Senokot S, MiraLax. 

## 2021-01-14 NOTE — Assessment & Plan Note (Signed)
stable, T11, L1 compression fx, takes Tylenol, Gabapentin 

## 2021-01-14 NOTE — Assessment & Plan Note (Signed)
takes Vit d daily

## 2021-01-14 NOTE — Assessment & Plan Note (Signed)
Hospitalized 12/30/20-01/13/21 for inpatient geriatric psychiatry due to suicidal attempt. Hx of suicidal attempt many years ago by overdosing on medication. No suicidal ideation, homicidal ideation, auditory, or visual hallucination.

## 2021-01-14 NOTE — Assessment & Plan Note (Signed)
plt 158 12/27/20

## 2021-01-14 NOTE — Assessment & Plan Note (Signed)
Vit B12 level 499 05/21/20, takes Vit B12

## 2021-01-14 NOTE — Assessment & Plan Note (Signed)
takes Zyrtec. 

## 2021-01-14 NOTE — Assessment & Plan Note (Signed)
Blood pressure is controlled, off Amlodipine. Bun/creat 29/0.52 12/27/20

## 2021-01-15 ENCOUNTER — Encounter: Payer: Self-pay | Admitting: Nurse Practitioner

## 2021-01-15 DIAGNOSIS — M6281 Muscle weakness (generalized): Secondary | ICD-10-CM | POA: Diagnosis not present

## 2021-01-15 DIAGNOSIS — G4700 Insomnia, unspecified: Secondary | ICD-10-CM | POA: Diagnosis not present

## 2021-01-15 DIAGNOSIS — F411 Generalized anxiety disorder: Secondary | ICD-10-CM | POA: Diagnosis not present

## 2021-01-15 DIAGNOSIS — R41841 Cognitive communication deficit: Secondary | ICD-10-CM | POA: Diagnosis not present

## 2021-01-15 DIAGNOSIS — S72011D Unspecified intracapsular fracture of right femur, subsequent encounter for closed fracture with routine healing: Secondary | ICD-10-CM | POA: Diagnosis not present

## 2021-01-15 DIAGNOSIS — R2689 Other abnormalities of gait and mobility: Secondary | ICD-10-CM | POA: Diagnosis not present

## 2021-01-15 DIAGNOSIS — F332 Major depressive disorder, recurrent severe without psychotic features: Secondary | ICD-10-CM | POA: Diagnosis not present

## 2021-01-15 DIAGNOSIS — M5459 Other low back pain: Secondary | ICD-10-CM | POA: Diagnosis not present

## 2021-01-15 DIAGNOSIS — F03A3 Unspecified dementia, mild, with mood disturbance: Secondary | ICD-10-CM | POA: Diagnosis not present

## 2021-01-15 DIAGNOSIS — F339 Major depressive disorder, recurrent, unspecified: Secondary | ICD-10-CM | POA: Diagnosis not present

## 2021-01-18 DIAGNOSIS — R2689 Other abnormalities of gait and mobility: Secondary | ICD-10-CM | POA: Diagnosis not present

## 2021-01-18 DIAGNOSIS — M6281 Muscle weakness (generalized): Secondary | ICD-10-CM | POA: Diagnosis not present

## 2021-01-18 DIAGNOSIS — M5459 Other low back pain: Secondary | ICD-10-CM | POA: Diagnosis not present

## 2021-01-18 DIAGNOSIS — R41841 Cognitive communication deficit: Secondary | ICD-10-CM | POA: Diagnosis not present

## 2021-01-18 DIAGNOSIS — S72011D Unspecified intracapsular fracture of right femur, subsequent encounter for closed fracture with routine healing: Secondary | ICD-10-CM | POA: Diagnosis not present

## 2021-01-18 DIAGNOSIS — F339 Major depressive disorder, recurrent, unspecified: Secondary | ICD-10-CM | POA: Diagnosis not present

## 2021-01-19 ENCOUNTER — Encounter: Payer: Self-pay | Admitting: Internal Medicine

## 2021-01-19 ENCOUNTER — Non-Acute Institutional Stay (SKILLED_NURSING_FACILITY): Payer: Medicare Other | Admitting: Internal Medicine

## 2021-01-19 DIAGNOSIS — F32A Depression, unspecified: Secondary | ICD-10-CM

## 2021-01-19 DIAGNOSIS — Z8781 Personal history of (healed) traumatic fracture: Secondary | ICD-10-CM

## 2021-01-19 DIAGNOSIS — K5901 Slow transit constipation: Secondary | ICD-10-CM

## 2021-01-19 DIAGNOSIS — M810 Age-related osteoporosis without current pathological fracture: Secondary | ICD-10-CM

## 2021-01-19 DIAGNOSIS — I10 Essential (primary) hypertension: Secondary | ICD-10-CM | POA: Diagnosis not present

## 2021-01-19 NOTE — Progress Notes (Signed)
Provider:  Veleta Miners MD  Location:   [facility] Room Number: 30 Place of Service:  SNF (31)  PCP: Plotnikov, [name], MD Patient Care Team: Cassandria Anger, MD as PCP - General (Internal Medicine)  Extended Emergency Contact Information Primary Emergency Contact: Burman Blacksmith Address: [address]          [place], [place] [address] [place] Phone: [phone] Relation: Spouse Secondary Emergency Contact: [facility] Phone: [phone] Relation: Daughter  Code Status: Full Code Goals of Care: Advanced Directive information Advanced Directives 01/14/2021  Does Patient Have a Medical Advance Directive? No  Type of Advance Directive -  Does patient want to make changes to medical advance directive? No - Patient declined  Copy of Tina in Chart? -  Would patient like information on creating a medical advance directive? -  Some encounter information is confidential and restricted. Go to Review Flowsheets activity to see all data.      Chief Complaint  Patient presents with   Readmit To SNF    HPI: Patient is a 80 y.o. female seen today for Readmission to SNF   Admitted in the hospital from 12/30/20-01/13/21 For Suicide attempt and Acute depression   Patient has h/o Osteoporosis with Multiple Compression Fracture on Evenity per Dr [name] Was also admitted in the hospital from 7/12-7/14 for Change in Mental status most likely due to Remeron her CT scan showed moderate atrophy with chronic microvascular ischemia Then had fall in the room and sustained Right Hip Fracture.admitted in the hospital from 7/28-8/1 for closed right hip fracture S/p right hip hemiarthroplasty   Patient was living in [place] in [place] noticed her spinning in her room so she can dizzy and Fall and die Also spitting around Was send to ED and eventually admitted to [facility]. For inpatient  Psych. Lexapro changed ot Prozac. And Zyprexa Now in SNF. 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   Depression    years ago   Headache    sinus headaches   Lipoma of arm    Right   Medical history non-contributory    Osteoporosis    SVD (spontaneous vaginal delivery)    x 2   Varicose vein    Vitamin D deficiency    Past Surgical History:  Procedure Laterality Date   ANTERIOR APPROACH HEMI HIP ARTHROPLASTY Right 09/04/2020   Procedure: ANTERIOR APPROACH HEMI HIP ARTHROPLASTY;  Surgeon: Rod Can, MD;  Location: [facility];  Service: Orthopedics;  Laterality: Right;   [place]   COLONOSCOPY     GANGLION CYST EXCISION     x2 left and right arm   HYSTEROSCOPY WITH D & C N/A 07/15/2013   Procedure: DILATATION AND CURETTAGE /HYSTEROSCOPY;  Surgeon: Lovenia Kim, MD;  Location: [facility];  Service: Gynecology;  Laterality: N/A;   KYPHOPLASTY Bilateral 05/04/2020   Procedure: KYPHOPLASTY THORACIC TWELVE;  Surgeon: Vallarie Mare, MD;  Location: [facility];  Service: Neurosurgery;  Laterality: Bilateral;   MANDIBLE SURGERY  1954   WISDOM TOOTH EXTRACTION      reports that she has never smoked. She has never used smokeless tobacco. She reports that she  does not drink alcohol and does not use drugs. Social History   Socioeconomic History   Marital status: Married    Spouse name: Not on file   Number of children: 1   Years of education: Not on file   Highest education level: Not on file  Occupational History   Occupation: retired  Tobacco Use   Smoking status: Never   Smokeless tobacco: Never  Vaping Use   Vaping Use: Never used  Substance and Sexual Activity   Alcohol use: No   Drug use: No   Sexual activity: Yes    Birth control/protection: Post-menopausal  Other  Topics Concern   Not on file  Social History Narrative   GI in [place] for colonoscopy   Dr Ronita Hipps - GYN      Denies Surgical history      Family history of varicose veins   Mother is 24      Regular exercise - NO   Social Determinants of Health   Financial Resource Strain: Not on file  Food Insecurity: Not on file  Transportation Needs: Not on file  Physical Activity: Not on file  Stress: Not on file  Social Connections: Not on file  Intimate Partner Violence: Not on file    Functional Status Survey:    Family History  Problem Relation Age of Onset   Hypertension Mother    Heart disease Father    Cancer Father     Health Maintenance  Topic Date Due   TETANUS/TDAP  12/25/2022   Pneumonia Vaccine 55+ Years old  Completed   INFLUENZA VACCINE  Completed   DEXA SCAN  Completed   COVID-19 Vaccine  Completed   Zoster Vaccines- Shingrix  Completed   HPV VACCINES  Aged Out    Allergies  Allergen Reactions   Alendronate Sodium Other (See Comments)    Leg cramps and "Allergic," per MAR   Risedronate Sodium Other (See Comments)    Caused the patient to be achy and is "Allergic," per MAR   Tramadol Other (See Comments)    Caused the patient to feel badly    Keflex [Cephalexin] Other (See Comments)    Possible rash    Allergies as of 01/19/2021       Reactions   Alendronate Sodium Other (See Comments)   Leg cramps and "Allergic," per MAR   Risedronate Sodium Other (See Comments)   Caused the patient to be achy and is "Allergic," per MAR   Tramadol Other (See Comments)   Caused the patient to feel badly    Keflex [cephalexin] Other (See Comments)   Possible rash        Medication List        Accurate as of January 19, 2021 11:59 PM. If you have any questions, ask your nurse or doctor.          acetaminophen 500 MG tablet Commonly known as: TYLENOL Take 1,000 mg by mouth every 6 (six) hours as needed for moderate pain, mild pain, headache or fever.    ASPIRIN 81 PO Take 1 tablet by mouth daily.   CALCIUM-CARB 600 PO Take 1 tablet by mouth 2 (two) times daily.   cetirizine 10 MG tablet Commonly known as: ZyrTEC Allergy Take 1 tablet (10 mg total) by mouth daily.   docusate sodium 100 MG capsule Commonly known as: COLACE Take 100 mg by mouth at bedtime.   FLUoxetine 20 MG capsule Commonly known as: PROZAC Take 1 capsule (20 mg total) by  mouth at bedtime.   gabapentin 100 MG capsule Commonly known as: NEURONTIN Take 1 capsule (100 mg total) by mouth 3 (three) times daily.   melatonin 5 MG Tabs Take 5 mg by mouth at bedtime.   Nizoral A-D 1 % Sham Generic drug: KETOCONAZOLE (TOPICAL) Apply 1 application topically once a week. Wednesday   OLANZapine 5 MG tablet Commonly known as: ZYPREXA Take 1 tablet (5 mg total) by mouth at bedtime.   polyethylene glycol 17 g packet Commonly known as: MIRALAX / GLYCOLAX Take 17 g by mouth daily as needed for moderate constipation or mild constipation. What changed: when to take this   PRESERVISION AREDS 2 PO Take 1 capsule by mouth 2 (two) times daily.   RESOURCE 2.0 PO Take 180 mLs by mouth 2 (two) times daily.   Salonpas 3.02-13-08 % Ptch Generic drug: Camphor-Menthol-Methyl Sal Apply 1 patch topically See admin instructions. Apply 1 patch to the lower back and remove (at most) after 8 hours- once a day as needed for pain   senna-docusate 8.6-50 MG tablet Commonly known as: Senokot-S Take 2 tablets by mouth daily.   sodium phosphate 7-19 GM/118ML Enem Place 1 enema rectally daily as needed for severe constipation or mild constipation.   VITAMIN B12-FOLIC ACID PO Place 1 lozenge under the tongue in the morning.   Vitamin D3 50 MCG (2000 UT) capsule Take 1 capsule (2,000 Units total) by mouth daily.        Review of Systems  Constitutional:  Positive for activity change. Negative for appetite change.  HENT: Negative.    Respiratory:  Negative for cough and shortness  of breath.   Cardiovascular:  Negative for leg swelling.  Gastrointestinal:  Positive for constipation.  Genitourinary: Negative.   Musculoskeletal:  Positive for gait problem. Negative for arthralgias and myalgias.  Skin: Negative.   Neurological:  Negative for dizziness and weakness.  Psychiatric/Behavioral:  Positive for confusion and dysphoric mood. Negative for sleep disturbance. The patient is nervous/anxious.    Vitals:   01/19/21 1046  BP: (!) 160/80  Pulse: 71  Resp: 15  Temp: (!) 97.2 F (36.2 C)  SpO2: 98%  Weight: 82 lb 9.6 oz (37.5 kg)  Height: 5\' 4"  (1.626 m)   Body mass index is 14.18 kg/m. Physical Exam Vitals reviewed.  Constitutional:      Appearance: Normal appearance.     Comments: Very frail and Under weight   HENT:     Head: Normocephalic.     Nose: Nose normal.     Mouth/Throat:     Mouth: Mucous membranes are moist.     Pharynx: Oropharynx is clear.  Eyes:     Pupils: Pupils are equal, round, and reactive to light.  Cardiovascular:     Rate and Rhythm: Normal rate and regular rhythm.     Pulses: Normal pulses.     Heart sounds: Normal heart sounds. No murmur heard. Pulmonary:     Effort: Pulmonary effort is normal.     Breath sounds: Normal breath sounds.  Abdominal:     General: Abdomen is flat. Bowel sounds are normal.     Palpations: Abdomen is soft.  Musculoskeletal:        General: No swelling.     Cervical back: Neck supple.  Skin:    General: Skin is warm.  Neurological:     General: No focal deficit present.     Mental Status: She is alert and oriented to person, place, and time.  Psychiatric:        Mood and Affect: Mood normal.        Thought Content: Thought content normal.    Labs reviewed: Basic Metabolic Panel: Recent Labs    08/18/20 2330 08/19/20 0429 09/03/20 0716 09/05/20 0537 09/06/20 0533 09/18/20 0000 09/22/20 0000 11/11/20 0000 12/27/20 1410  NA  --  137   < > 133* 135   < > 138 139 140  K  --  3.6    < > 3.2* 3.5   < > 4.1 4.1 4.1  CL  --  104   < > 101 102   < > 100 104 105  CO2  --  24  --  25 27   < > 27* 28* 31  GLUCOSE  --  80   < > 110* 95  --   --   --  102*  BUN  --  10   < > 23 18   < > 18 18 29*  CREATININE  --  <0.30*   < > 0.59 0.40*   < > 0.5 0.6 0.52  CALCIUM  --  8.6*  --  8.6* 8.7*   < > 8.5* 8.6* 8.8*  MG 2.2 2.1  --   --   --   --   --   --   --   PHOS  --  3.1  --   --   --   --   --   --   --    < > = values in this interval not displayed.   Liver Function Tests: Recent Labs    08/18/20 1622 08/19/20 0429 09/18/20 0000 09/22/20 0000 11/11/20 0000 12/27/20 1410  AST 25 22 21 26 14 17   ALT 18 18 22 28 9 15   ALKPHOS 91 86 100 112 57 53  BILITOT 0.9 1.0  --   --   --  0.4  PROT 6.3* 6.4*  --   --   --  6.0*  ALBUMIN 3.7 3.9 3.1* 3.0*  --  3.3*   Recent Labs    08/18/20 1622  LIPASE 34   No results for input(s): AMMONIA in the last 8760 hours. CBC: Recent Labs    09/06/20 0533 09/07/20 0400 09/18/20 0000 09/22/20 0000 11/11/20 0000 12/27/20 1410  WBC 9.6 8.1   < > 5.7 4.1 4.3  NEUTROABS  --   --    < > 4,252.00 2,362.00 2.3  HGB 11.1* 10.1*   < > 11.3* 10.6* 12.0  HCT 34.1* 30.1*   < > 35* 33* 36.8  MCV 93.2 92.0  --   --   --  95.8  PLT 121* 136*  --  342 136* 158   < > = values in this interval not displayed.   Cardiac Enzymes: No results for input(s): CKTOTAL, CKMB, CKMBINDEX, TROPONINI in the last 8760 hours. BNP: Invalid input(s): POCBNP Lab Results  Component Value Date   HGBA1C 5.5 01/06/2021   Lab Results  Component Value Date   TSH 2.12 05/21/2020   Lab Results  Component Value Date   EPPIRJJO84 166 05/21/2020   No results found for: FOLATE No results found for: IRON, TIBC, FERRITIN  Imaging and Procedures obtained prior to SNF admission: No results found.  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

## 2021-01-20 DIAGNOSIS — F339 Major depressive disorder, recurrent, unspecified: Secondary | ICD-10-CM | POA: Diagnosis not present

## 2021-01-20 DIAGNOSIS — M6281 Muscle weakness (generalized): Secondary | ICD-10-CM | POA: Diagnosis not present

## 2021-01-20 DIAGNOSIS — M5459 Other low back pain: Secondary | ICD-10-CM | POA: Diagnosis not present

## 2021-01-20 DIAGNOSIS — R2689 Other abnormalities of gait and mobility: Secondary | ICD-10-CM | POA: Diagnosis not present

## 2021-01-20 DIAGNOSIS — R41841 Cognitive communication deficit: Secondary | ICD-10-CM | POA: Diagnosis not present

## 2021-01-20 DIAGNOSIS — S72011D Unspecified intracapsular fracture of right femur, subsequent encounter for closed fracture with routine healing: Secondary | ICD-10-CM | POA: Diagnosis not present

## 2021-01-21 DIAGNOSIS — M6281 Muscle weakness (generalized): Secondary | ICD-10-CM | POA: Diagnosis not present

## 2021-01-21 DIAGNOSIS — S72011D Unspecified intracapsular fracture of right femur, subsequent encounter for closed fracture with routine healing: Secondary | ICD-10-CM | POA: Diagnosis not present

## 2021-01-21 DIAGNOSIS — F339 Major depressive disorder, recurrent, unspecified: Secondary | ICD-10-CM | POA: Diagnosis not present

## 2021-01-21 DIAGNOSIS — M5459 Other low back pain: Secondary | ICD-10-CM | POA: Diagnosis not present

## 2021-01-21 DIAGNOSIS — R41841 Cognitive communication deficit: Secondary | ICD-10-CM | POA: Diagnosis not present

## 2021-01-21 DIAGNOSIS — R2689 Other abnormalities of gait and mobility: Secondary | ICD-10-CM | POA: Diagnosis not present

## 2021-01-22 DIAGNOSIS — R2689 Other abnormalities of gait and mobility: Secondary | ICD-10-CM | POA: Diagnosis not present

## 2021-01-22 DIAGNOSIS — M6281 Muscle weakness (generalized): Secondary | ICD-10-CM | POA: Diagnosis not present

## 2021-01-22 DIAGNOSIS — F339 Major depressive disorder, recurrent, unspecified: Secondary | ICD-10-CM | POA: Diagnosis not present

## 2021-01-22 DIAGNOSIS — S72011D Unspecified intracapsular fracture of right femur, subsequent encounter for closed fracture with routine healing: Secondary | ICD-10-CM | POA: Diagnosis not present

## 2021-01-22 DIAGNOSIS — R41841 Cognitive communication deficit: Secondary | ICD-10-CM | POA: Diagnosis not present

## 2021-01-22 DIAGNOSIS — M5459 Other low back pain: Secondary | ICD-10-CM | POA: Diagnosis not present

## 2021-01-25 DIAGNOSIS — S72011D Unspecified intracapsular fracture of right femur, subsequent encounter for closed fracture with routine healing: Secondary | ICD-10-CM | POA: Diagnosis not present

## 2021-01-25 DIAGNOSIS — M5459 Other low back pain: Secondary | ICD-10-CM | POA: Diagnosis not present

## 2021-01-25 DIAGNOSIS — F339 Major depressive disorder, recurrent, unspecified: Secondary | ICD-10-CM | POA: Diagnosis not present

## 2021-01-25 DIAGNOSIS — R2689 Other abnormalities of gait and mobility: Secondary | ICD-10-CM | POA: Diagnosis not present

## 2021-01-25 DIAGNOSIS — M6281 Muscle weakness (generalized): Secondary | ICD-10-CM | POA: Diagnosis not present

## 2021-01-25 DIAGNOSIS — R41841 Cognitive communication deficit: Secondary | ICD-10-CM | POA: Diagnosis not present

## 2021-01-27 DIAGNOSIS — M6281 Muscle weakness (generalized): Secondary | ICD-10-CM | POA: Diagnosis not present

## 2021-01-27 DIAGNOSIS — R41841 Cognitive communication deficit: Secondary | ICD-10-CM | POA: Diagnosis not present

## 2021-01-27 DIAGNOSIS — R2689 Other abnormalities of gait and mobility: Secondary | ICD-10-CM | POA: Diagnosis not present

## 2021-01-27 DIAGNOSIS — S72011D Unspecified intracapsular fracture of right femur, subsequent encounter for closed fracture with routine healing: Secondary | ICD-10-CM | POA: Diagnosis not present

## 2021-01-27 DIAGNOSIS — M5459 Other low back pain: Secondary | ICD-10-CM | POA: Diagnosis not present

## 2021-01-27 DIAGNOSIS — F339 Major depressive disorder, recurrent, unspecified: Secondary | ICD-10-CM | POA: Diagnosis not present

## 2021-01-28 DIAGNOSIS — F411 Generalized anxiety disorder: Secondary | ICD-10-CM | POA: Diagnosis not present

## 2021-01-28 DIAGNOSIS — F331 Major depressive disorder, recurrent, moderate: Secondary | ICD-10-CM | POA: Diagnosis not present

## 2021-01-29 DIAGNOSIS — R2689 Other abnormalities of gait and mobility: Secondary | ICD-10-CM | POA: Diagnosis not present

## 2021-01-29 DIAGNOSIS — S72011D Unspecified intracapsular fracture of right femur, subsequent encounter for closed fracture with routine healing: Secondary | ICD-10-CM | POA: Diagnosis not present

## 2021-01-29 DIAGNOSIS — M6281 Muscle weakness (generalized): Secondary | ICD-10-CM | POA: Diagnosis not present

## 2021-01-29 DIAGNOSIS — M5459 Other low back pain: Secondary | ICD-10-CM | POA: Diagnosis not present

## 2021-01-29 DIAGNOSIS — F339 Major depressive disorder, recurrent, unspecified: Secondary | ICD-10-CM | POA: Diagnosis not present

## 2021-01-29 DIAGNOSIS — R41841 Cognitive communication deficit: Secondary | ICD-10-CM | POA: Diagnosis not present

## 2021-02-02 DIAGNOSIS — S72011D Unspecified intracapsular fracture of right femur, subsequent encounter for closed fracture with routine healing: Secondary | ICD-10-CM | POA: Diagnosis not present

## 2021-02-02 DIAGNOSIS — R41841 Cognitive communication deficit: Secondary | ICD-10-CM | POA: Diagnosis not present

## 2021-02-02 DIAGNOSIS — M5459 Other low back pain: Secondary | ICD-10-CM | POA: Diagnosis not present

## 2021-02-02 DIAGNOSIS — M6281 Muscle weakness (generalized): Secondary | ICD-10-CM | POA: Diagnosis not present

## 2021-02-02 DIAGNOSIS — F339 Major depressive disorder, recurrent, unspecified: Secondary | ICD-10-CM | POA: Diagnosis not present

## 2021-02-02 DIAGNOSIS — R2689 Other abnormalities of gait and mobility: Secondary | ICD-10-CM | POA: Diagnosis not present

## 2021-02-03 DIAGNOSIS — R2689 Other abnormalities of gait and mobility: Secondary | ICD-10-CM | POA: Diagnosis not present

## 2021-02-03 DIAGNOSIS — M6281 Muscle weakness (generalized): Secondary | ICD-10-CM | POA: Diagnosis not present

## 2021-02-03 DIAGNOSIS — S72011D Unspecified intracapsular fracture of right femur, subsequent encounter for closed fracture with routine healing: Secondary | ICD-10-CM | POA: Diagnosis not present

## 2021-02-03 DIAGNOSIS — R41841 Cognitive communication deficit: Secondary | ICD-10-CM | POA: Diagnosis not present

## 2021-02-03 DIAGNOSIS — M5459 Other low back pain: Secondary | ICD-10-CM | POA: Diagnosis not present

## 2021-02-03 DIAGNOSIS — F339 Major depressive disorder, recurrent, unspecified: Secondary | ICD-10-CM | POA: Diagnosis not present

## 2021-02-04 DIAGNOSIS — R2689 Other abnormalities of gait and mobility: Secondary | ICD-10-CM | POA: Diagnosis not present

## 2021-02-04 DIAGNOSIS — M6281 Muscle weakness (generalized): Secondary | ICD-10-CM | POA: Diagnosis not present

## 2021-02-04 DIAGNOSIS — M5459 Other low back pain: Secondary | ICD-10-CM | POA: Diagnosis not present

## 2021-02-04 DIAGNOSIS — S72011D Unspecified intracapsular fracture of right femur, subsequent encounter for closed fracture with routine healing: Secondary | ICD-10-CM | POA: Diagnosis not present

## 2021-02-04 DIAGNOSIS — R41841 Cognitive communication deficit: Secondary | ICD-10-CM | POA: Diagnosis not present

## 2021-02-04 DIAGNOSIS — F339 Major depressive disorder, recurrent, unspecified: Secondary | ICD-10-CM | POA: Diagnosis not present

## 2021-02-05 DIAGNOSIS — R2689 Other abnormalities of gait and mobility: Secondary | ICD-10-CM | POA: Diagnosis not present

## 2021-02-05 DIAGNOSIS — M5459 Other low back pain: Secondary | ICD-10-CM | POA: Diagnosis not present

## 2021-02-05 DIAGNOSIS — S72011D Unspecified intracapsular fracture of right femur, subsequent encounter for closed fracture with routine healing: Secondary | ICD-10-CM | POA: Diagnosis not present

## 2021-02-05 DIAGNOSIS — R41841 Cognitive communication deficit: Secondary | ICD-10-CM | POA: Diagnosis not present

## 2021-02-05 DIAGNOSIS — F339 Major depressive disorder, recurrent, unspecified: Secondary | ICD-10-CM | POA: Diagnosis not present

## 2021-02-05 DIAGNOSIS — M6281 Muscle weakness (generalized): Secondary | ICD-10-CM | POA: Diagnosis not present

## 2021-02-08 DIAGNOSIS — F339 Major depressive disorder, recurrent, unspecified: Secondary | ICD-10-CM | POA: Diagnosis not present

## 2021-02-08 DIAGNOSIS — S72011D Unspecified intracapsular fracture of right femur, subsequent encounter for closed fracture with routine healing: Secondary | ICD-10-CM | POA: Diagnosis not present

## 2021-02-08 DIAGNOSIS — R2689 Other abnormalities of gait and mobility: Secondary | ICD-10-CM | POA: Diagnosis not present

## 2021-02-08 DIAGNOSIS — M6281 Muscle weakness (generalized): Secondary | ICD-10-CM | POA: Diagnosis not present

## 2021-02-08 DIAGNOSIS — M5459 Other low back pain: Secondary | ICD-10-CM | POA: Diagnosis not present

## 2021-02-08 DIAGNOSIS — R41841 Cognitive communication deficit: Secondary | ICD-10-CM | POA: Diagnosis not present

## 2021-02-08 DIAGNOSIS — Z9181 History of falling: Secondary | ICD-10-CM | POA: Diagnosis not present

## 2021-02-09 DIAGNOSIS — F339 Major depressive disorder, recurrent, unspecified: Secondary | ICD-10-CM | POA: Diagnosis not present

## 2021-02-09 DIAGNOSIS — S72011D Unspecified intracapsular fracture of right femur, subsequent encounter for closed fracture with routine healing: Secondary | ICD-10-CM | POA: Diagnosis not present

## 2021-02-09 DIAGNOSIS — M6281 Muscle weakness (generalized): Secondary | ICD-10-CM | POA: Diagnosis not present

## 2021-02-09 DIAGNOSIS — R41841 Cognitive communication deficit: Secondary | ICD-10-CM | POA: Diagnosis not present

## 2021-02-09 DIAGNOSIS — M5459 Other low back pain: Secondary | ICD-10-CM | POA: Diagnosis not present

## 2021-02-09 DIAGNOSIS — R2689 Other abnormalities of gait and mobility: Secondary | ICD-10-CM | POA: Diagnosis not present

## 2021-02-10 DIAGNOSIS — R2689 Other abnormalities of gait and mobility: Secondary | ICD-10-CM | POA: Diagnosis not present

## 2021-02-10 DIAGNOSIS — M6281 Muscle weakness (generalized): Secondary | ICD-10-CM | POA: Diagnosis not present

## 2021-02-10 DIAGNOSIS — R41841 Cognitive communication deficit: Secondary | ICD-10-CM | POA: Diagnosis not present

## 2021-02-10 DIAGNOSIS — F339 Major depressive disorder, recurrent, unspecified: Secondary | ICD-10-CM | POA: Diagnosis not present

## 2021-02-10 DIAGNOSIS — M5459 Other low back pain: Secondary | ICD-10-CM | POA: Diagnosis not present

## 2021-02-10 DIAGNOSIS — S72011D Unspecified intracapsular fracture of right femur, subsequent encounter for closed fracture with routine healing: Secondary | ICD-10-CM | POA: Diagnosis not present

## 2021-02-12 ENCOUNTER — Non-Acute Institutional Stay (SKILLED_NURSING_FACILITY): Payer: Medicare Other | Admitting: Nurse Practitioner

## 2021-02-12 ENCOUNTER — Encounter: Payer: Self-pay | Admitting: Nurse Practitioner

## 2021-02-12 DIAGNOSIS — D500 Iron deficiency anemia secondary to blood loss (chronic): Secondary | ICD-10-CM | POA: Diagnosis not present

## 2021-02-12 DIAGNOSIS — I10 Essential (primary) hypertension: Secondary | ICD-10-CM | POA: Diagnosis not present

## 2021-02-12 DIAGNOSIS — F339 Major depressive disorder, recurrent, unspecified: Secondary | ICD-10-CM | POA: Diagnosis not present

## 2021-02-12 DIAGNOSIS — J3089 Other allergic rhinitis: Secondary | ICD-10-CM | POA: Diagnosis not present

## 2021-02-12 DIAGNOSIS — E559 Vitamin D deficiency, unspecified: Secondary | ICD-10-CM | POA: Diagnosis not present

## 2021-02-12 DIAGNOSIS — M159 Polyosteoarthritis, unspecified: Secondary | ICD-10-CM | POA: Diagnosis not present

## 2021-02-12 DIAGNOSIS — E538 Deficiency of other specified B group vitamins: Secondary | ICD-10-CM

## 2021-02-12 DIAGNOSIS — E785 Hyperlipidemia, unspecified: Secondary | ICD-10-CM | POA: Diagnosis not present

## 2021-02-12 DIAGNOSIS — R634 Abnormal weight loss: Secondary | ICD-10-CM

## 2021-02-12 DIAGNOSIS — F32A Depression, unspecified: Secondary | ICD-10-CM

## 2021-02-12 DIAGNOSIS — K5901 Slow transit constipation: Secondary | ICD-10-CM | POA: Diagnosis not present

## 2021-02-12 DIAGNOSIS — R2689 Other abnormalities of gait and mobility: Secondary | ICD-10-CM | POA: Diagnosis not present

## 2021-02-12 DIAGNOSIS — M5459 Other low back pain: Secondary | ICD-10-CM | POA: Diagnosis not present

## 2021-02-12 DIAGNOSIS — S72011D Unspecified intracapsular fracture of right femur, subsequent encounter for closed fracture with routine healing: Secondary | ICD-10-CM | POA: Diagnosis not present

## 2021-02-12 DIAGNOSIS — G309 Alzheimer's disease, unspecified: Secondary | ICD-10-CM | POA: Diagnosis not present

## 2021-02-12 DIAGNOSIS — M6281 Muscle weakness (generalized): Secondary | ICD-10-CM | POA: Diagnosis not present

## 2021-02-12 DIAGNOSIS — R41841 Cognitive communication deficit: Secondary | ICD-10-CM | POA: Diagnosis not present

## 2021-02-12 DIAGNOSIS — D696 Thrombocytopenia, unspecified: Secondary | ICD-10-CM

## 2021-02-12 DIAGNOSIS — M810 Age-related osteoporosis without current pathological fracture: Secondary | ICD-10-CM

## 2021-02-12 DIAGNOSIS — F0280 Dementia in other diseases classified elsewhere without behavioral disturbance: Secondary | ICD-10-CM

## 2021-02-12 DIAGNOSIS — R45851 Suicidal ideations: Secondary | ICD-10-CM

## 2021-02-12 DIAGNOSIS — M150 Primary generalized (osteo)arthritis: Secondary | ICD-10-CM

## 2021-02-12 NOTE — Assessment & Plan Note (Signed)
no further suicidal attempt/ideation, anxious at times,  on Prozac and Zyprexa, off Escitalopram, HPOA requested taper off Wellbutrin in the past, Mirtazapine made her AMS

## 2021-02-12 NOTE — Assessment & Plan Note (Signed)
Stable, takes Senokot S, MiraLax. 

## 2021-02-12 NOTE — Assessment & Plan Note (Signed)
Managed with diet, LDL 117 01/06/21

## 2021-02-12 NOTE — Assessment & Plan Note (Signed)
stable, T11, L1 compression fx, takes Tylenol, Gabapentin 

## 2021-02-12 NOTE — Assessment & Plan Note (Signed)
didn't tolerate Mirtazapine. Weight gained about #4Ibs in the past month.

## 2021-02-12 NOTE — Assessment & Plan Note (Signed)
takes Zyrtec. 

## 2021-02-12 NOTE — Assessment & Plan Note (Signed)
Stable, Hgb 12 12/27/20

## 2021-02-12 NOTE — Assessment & Plan Note (Signed)
Blood pressure is controlled,  off Amlodipine. Bun/creat 29/0.52 12/27/20

## 2021-02-12 NOTE — Assessment & Plan Note (Signed)
plt 158 12/27/20

## 2021-02-12 NOTE — Assessment & Plan Note (Signed)
takes Vit d daily

## 2021-02-12 NOTE — Progress Notes (Signed)
Location:   [facility] Room Number: 30 Place of Service:  SNF (31) Provider:  Dajaun Goldring, Lennie Odor, NP  Plotnikov, [name], MD  Patient Care Team: Cassandria Anger, MD as PCP - General (Internal Medicine)  Extended Emergency Contact Information Primary Emergency Contact: Burman Blacksmith Address: [place]          [place], [place] [address] [place] Phone: [phone] Relation: Spouse Secondary Emergency Contact: [facility] Phone: [phone] Relation: Daughter  Code Status:  FULL CODE Goals of care: Advanced Directive information Advanced Directives 02/12/2021  Does Patient Have a Medical Advance Directive? No  Type of Advance Directive -  Does patient want to make changes to medical advance directive? -  Copy of [place] in Chart? -  Would patient like information on creating a medical advance directive? -  Some encounter information is confidential and restricted. Go to Review Flowsheets activity to see all data.     Chief Complaint  Patient presents with   Medical Management of Chronic Issues    Routine follow up visit.    HPI:  Pt is a 81 y.o. female seen today for medical management of chronic diseases.      Hospitalized 12/30/20-01/13/21 for inpatient geriatric psychiatry due to suicidal attempt. Hx of suicidal attempt many years ago by overdosing on medication. No suicidal ideation, homicidal ideation, auditory, or visual hallucination. CT 11/10/20 no acute intracranial abnormality.  09/03/20-09/07/20 for s/p R hip hemiarthroplasty due to a mechanical fall, healed.              OP, off Evenity 2nd to cost              Anemia, Hgb 12 12/27/20             HTN, off Amlodipine. Bun/creat 29/0.52 12/27/20             Constipation, takes Senokot S, MiraLax.              Alzheimer's Dementia, Vit B12 499, TSH 2.12 05/21/20, SNF [facility] for supportive care. MMSE 28/30 08/26/20             Vit B12 deficiency,  Vit B12 level 499 05/21/20, takes Vit B12             Vitamin D deficiency, takes Vit d daily             Allergic rhinitis, takes Zyrtec.             Hyperlipidemia, diet, LDL 117 01/06/21             Depression, no further suicidal attempt/ideation, anxious at times,  on Prozac and Zyprexa, off Escitalopram, HPOA requested taper off Wellbutrin in the past, Mirtazapine made her AMS             Chronic back pain, stable, T11, L1 compression fx, takes Tylenol, Gabapentin             Weight loss/adult failure to thrive, didn't tolerate Mirtazapine. Weight gained about #4Ibs in the past month.              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   Depression    years ago   Headache    sinus headaches   Lipoma of arm  Right   Medical history non-contributory    Osteoporosis    SVD (spontaneous vaginal delivery)    x 2   Varicose vein    Vitamin D deficiency    Past Surgical History:  Procedure Laterality Date   ANTERIOR APPROACH HEMI HIP ARTHROPLASTY Right 09/04/2020   Procedure: ANTERIOR APPROACH HEMI HIP ARTHROPLASTY;  Surgeon: Rod Can, MD;  Location: [facility];  Service: Orthopedics;  Laterality: Right;   [place]   COLONOSCOPY     GANGLION CYST EXCISION     x2 left and right arm   HYSTEROSCOPY WITH D & C N/A 07/15/2013   Procedure: DILATATION AND CURETTAGE /HYSTEROSCOPY;  Surgeon: Lovenia Kim, MD;  Location: [facility];  Service: Gynecology;  Laterality: N/A;   KYPHOPLASTY Bilateral 05/04/2020   Procedure: KYPHOPLASTY THORACIC TWELVE;  Surgeon: Vallarie Mare, MD;  Location: [facility];  Service: Neurosurgery;  Laterality: Bilateral;   MANDIBLE SURGERY  1954   WISDOM TOOTH EXTRACTION      Allergies  Allergen Reactions   Alendronate Sodium Other (See Comments)    Leg cramps and "Allergic," per MAR   Risedronate Sodium Other (See  Comments)    Caused the patient to be achy and is "Allergic," per MAR   Tramadol Other (See Comments)    Caused the patient to feel badly    Keflex [Cephalexin] Other (See Comments)    Possible rash    Allergies as of 02/12/2021       Reactions   Alendronate Sodium Other (See Comments)   Leg cramps and "Allergic," per MAR   Risedronate Sodium Other (See Comments)   Caused the patient to be achy and is "Allergic," per MAR   Tramadol Other (See Comments)   Caused the patient to feel badly    Keflex [cephalexin] Other (See Comments)   Possible rash        Medication List        Accurate as of February 12, 2021 11:59 PM. If you have any questions, ask your nurse or doctor.          STOP taking these medications    RESOURCE 2.0 PO Stopped by: Apolinar Bero X Anjanette Gilkey, NP       TAKE these medications    acetaminophen 500 MG tablet Commonly known as: TYLENOL Take 1,000 mg by mouth every 6 (six) hours as needed for moderate pain, mild pain, headache or fever.   ASPIRIN 81 PO Take 1 tablet by mouth daily.   CALCIUM-CARB 600 PO Take 1 tablet by mouth 2 (two) times daily.   cetirizine 10 MG tablet Commonly known as: ZyrTEC Allergy Take 1 tablet (10 mg total) by mouth daily.   docusate sodium 100 MG capsule Commonly known as: COLACE Take 100 mg by mouth at bedtime.   FLUoxetine 20 MG capsule Commonly known as: PROZAC Take 1 capsule (20 mg total) by mouth at bedtime.   gabapentin 100 MG capsule Commonly known as: NEURONTIN Take 1 capsule (100 mg total) by mouth 3 (three) times daily.   melatonin 5 MG Tabs Take 5 mg by mouth at bedtime.   Nizoral A-D 1 % Sham Generic drug: KETOCONAZOLE (TOPICAL) Apply 1 application topically once a week. Wednesday   OLANZapine 5 MG tablet Commonly known as: ZYPREXA Take 1 tablet (5 mg total) by mouth at bedtime.   polyethylene glycol 17 g packet Commonly known as: MIRALAX / GLYCOLAX Take 17 g by mouth daily as needed for  moderate  constipation or mild constipation. What changed: when to take this   PRESERVISION AREDS 2 PO Take 1 capsule by mouth 2 (two) times daily.   Salonpas 3.02-13-08 % Ptch Generic drug: Camphor-Menthol-Methyl Sal Apply 1 patch topically See admin instructions. Apply 1 patch to the lower back and remove (at most) after 8 hours- once a day as needed for pain   senna-docusate 8.6-50 MG tablet Commonly known as: Senokot-S Take 2 tablets by mouth daily.   sodium phosphate 7-19 GM/118ML Enem Place 1 enema rectally daily as needed for severe constipation or mild constipation.   VITAMIN B12-FOLIC ACID PO Place 1 lozenge under the tongue in the morning.   Vitamin D3 50 MCG (2000 UT) capsule Take 1 capsule (2,000 Units total) by mouth daily.        Review of Systems  Constitutional:  Negative for fatigue, fever and unexpected weight change.       Weight gained about #4Ibs in the past month.   HENT:  Positive for hearing loss. Negative for congestion and voice change.   Eyes:  Negative for visual disturbance.  Respiratory:  Negative for cough and shortness of breath.   Cardiovascular:  Negative for leg swelling.  Gastrointestinal:  Negative for abdominal pain and constipation.  Genitourinary:  Negative for dysuria and urgency.  Musculoskeletal:  Positive for arthralgias, back pain and gait problem.  Skin:  Negative for color change.  Neurological:  Negative for speech difficulty, weakness and headaches.       Dementia.   Psychiatric/Behavioral:  Positive for dysphoric mood and sleep disturbance. Negative for behavioral problems, confusion and hallucinations. The patient is nervous/anxious.    Immunization History  Administered Date(s) Administered   DT (Pediatric) 04/10/2002   Fluad Quad(high Dose 65+) 10/28/2019   Influenza Split 03/08/2011, 11/17/2011   Influenza Whole 01/09/2007, 11/05/2008, 11/04/2009   Influenza, High Dose Seasonal PF 12/07/2015, 09/29/2016, 11/09/2017, 09/27/2018,  10/28/2019   Influenza-Unspecified 10/30/2012, 11/29/2013, 12/02/2014, 11/25/2020   Moderna Sars-Covid-2 Vaccination 02/08/2019, 03/11/2019   PFIZER(Purple Top)SARS-COV-2 Vaccination 10/09/2019   PPD Test 12/22/2015, 01/05/2016   Pneumococcal Conjugate-13 07/17/2014   Pneumococcal Polysaccharide-23 08/15/2005, 09/25/2015   Tetanus 12/24/2012   Unspecified SARS-COV-2 Vaccination 02/11/2019, 12/17/2019, 10/27/2020   Zoster Recombinat (Shingrix) 06/07/2019, 09/06/2019   Zoster, Live 12/05/2013   Pertinent  Health Maintenance Due  Topic Date Due   INFLUENZA VACCINE  Completed   DEXA SCAN  Completed   Fall Risk 12/27/2020 12/28/2020 12/28/2020 12/29/2020 12/30/2020  Falls in the past year? - - - - -  Was there an injury with Fall? - - - - -  Fall Risk Category Calculator - - - - -  Fall Risk Category - - - - -  Patient Fall Risk Level Moderate fall risk Moderate fall risk High fall risk High fall risk High fall risk  Patient at Risk for Falls Due to - - - - -  Fall risk Follow up - - - - -  Some encounter information is confidential and restricted. Go to Review Flowsheets activity to see all data.   Functional Status Survey:    Vitals:   02/12/21 0816  BP: 111/78  Pulse: 76  Temp: (!) 97.3 F (36.3 C)  SpO2: 97%  Weight: 86 lb 1.6 oz (39.1 kg)  Height: 5\' 4"  (1.626 m)   Body mass index is 14.78 kg/m. Physical Exam Vitals and nursing note reviewed.  Constitutional:      Appearance: Normal appearance.  HENT:     Head:  Normocephalic.     Nose: Nose normal.     Mouth/Throat:     Mouth: Mucous membranes are moist.  Eyes:     Conjunctiva/sclera: Conjunctivae normal.     Pupils: Pupils are equal, round, and reactive to light.  Cardiovascular:     Rate and Rhythm: Normal rate.     Heart sounds: No murmur heard. Pulmonary:     Effort: Pulmonary effort is normal.     Breath sounds: No rales.  Abdominal:     General: Bowel sounds are normal.     Palpations: Abdomen is  soft.     Tenderness: There is no abdominal tenderness.  Musculoskeletal:     Cervical back: Normal range of motion and neck supple.     Right lower leg: No edema.     Left lower leg: No edema.     Comments: Right hip s/p ORIF 09/08/20, healed.   Skin:    General: Skin is warm and dry.  Neurological:     General: No focal deficit present.     Mental Status: She is alert and oriented to person, place, and time. Mental status is at baseline.     Motor: No weakness.     Coordination: Coordination normal.     Gait: Gait abnormal.  Psychiatric:        Behavior: Behavior normal.     Comments: Sad facial looks, appears anxious of planning to get her driver's license soon.     Labs reviewed: Recent Labs    08/18/20 2330 08/19/20 0429 09/03/20 0716 09/05/20 0537 09/06/20 0533 09/18/20 0000 09/22/20 0000 11/11/20 0000 12/27/20 1410  NA  --  137   < > 133* 135   < > 138 139 140  K  --  3.6   < > 3.2* 3.5   < > 4.1 4.1 4.1  CL  --  104   < > 101 102   < > 100 104 105  CO2  --  24  --  25 27   < > 27* 28* 31  GLUCOSE  --  80   < > 110* 95  --   --   --  102*  BUN  --  10   < > 23 18   < > 18 18 29*  CREATININE  --  <0.30*   < > 0.59 0.40*   < > 0.5 0.6 0.52  CALCIUM  --  8.6*  --  8.6* 8.7*   < > 8.5* 8.6* 8.8*  MG 2.2 2.1  --   --   --   --   --   --   --   PHOS  --  3.1  --   --   --   --   --   --   --    < > = values in this interval not displayed.   Recent Labs    08/18/20 1622 08/19/20 0429 09/18/20 0000 09/22/20 0000 11/11/20 0000 12/27/20 1410  AST 25 22 21 26 14 17   ALT 18 18 22 28 9 15   ALKPHOS 91 86 100 112 57 53  BILITOT 0.9 1.0  --   --   --  0.4  PROT 6.3* 6.4*  --   --   --  6.0*  ALBUMIN 3.7 3.9 3.1* 3.0*  --  3.3*   Recent Labs    09/06/20 0533 09/07/20 0400 09/18/20 0000 09/22/20 0000 11/11/20 0000 12/27/20 1410  WBC 9.6 8.1   < >  5.7 4.1 4.3  NEUTROABS  --   --    < > 4,252.00 2,362.00 2.3  HGB 11.1* 10.1*   < > 11.3* 10.6* 12.0  HCT 34.1*  30.1*   < > 35* 33* 36.8  MCV 93.2 92.0  --   --   --  95.8  PLT 121* 136*  --  342 136* 158   < > = values in this interval not displayed.   Lab Results  Component Value Date   TSH 2.12 05/21/2020   Lab Results  Component Value Date   HGBA1C 5.5 01/06/2021   Lab Results  Component Value Date   CHOL 182 01/06/2021   HDL 58 01/06/2021   LDLCALC 117 (H) 01/06/2021   LDLDIRECT 135.4 12/26/2008   TRIG 37 01/06/2021   CHOLHDL 3.1 01/06/2021    Significant Diagnostic Results in last 30 days:  No results found.  Assessment/Plan Alzheimer disease ([place]) Vit B12 499, TSH 2.12 05/21/20, SNF [facility] for supportive care. MMSE 28/30 08/26/20  Vitamin B12 deficiency Vit B12 level 499 05/21/20, takes Vit B12  Vitamin D deficiency takes Vit d daily  Allergic rhinitis takes Zyrtec.  Hyperlipidemia Managed with diet, LDL 117 01/06/21  Depression with suicidal ideation no further suicidal attempt/ideation, anxious at times,  on Prozac and Zyprexa, off Escitalopram, HPOA requested taper off Wellbutrin in the past, Mirtazapine made her AMS  Osteoarthritis, multiple sites  stable, T11, L1 compression fx, takes Tylenol, Gabapentin  Weight loss, abnormal  didn't tolerate Mirtazapine. Weight gained about #4Ibs in the past month.   Thrombocytopenia (HCC) plt 158 12/27/20  Slow transit constipation Stable, takes Senokot S, MiraLax.   HTN (hypertension) Blood pressure is controlled,  off Amlodipine. Bun/creat 29/0.52 12/27/20  Blood loss anemia Stable, Hgb 12 12/27/20  Osteoporosis  off Evenity 2nd to cost      Family/ staff Communication: plan of care reviewed with the patient and charge nurse.   Labs/tests ordered:  none  Time spend 35 minutes.

## 2021-02-12 NOTE — Assessment & Plan Note (Signed)
Vit B12 level 499 05/21/20, takes Vit B12

## 2021-02-12 NOTE — Assessment & Plan Note (Signed)
off Evenity 2nd to cost

## 2021-02-12 NOTE — Assessment & Plan Note (Signed)
Vit B12 499, TSH 2.12 05/21/20, SNF [facility] for supportive care. MMSE 28/30 08/26/20

## 2021-02-15 ENCOUNTER — Encounter: Payer: Self-pay | Admitting: Nurse Practitioner

## 2021-02-15 DIAGNOSIS — M6281 Muscle weakness (generalized): Secondary | ICD-10-CM | POA: Diagnosis not present

## 2021-02-15 DIAGNOSIS — S72011D Unspecified intracapsular fracture of right femur, subsequent encounter for closed fracture with routine healing: Secondary | ICD-10-CM | POA: Diagnosis not present

## 2021-02-15 DIAGNOSIS — R2689 Other abnormalities of gait and mobility: Secondary | ICD-10-CM | POA: Diagnosis not present

## 2021-02-15 DIAGNOSIS — R41841 Cognitive communication deficit: Secondary | ICD-10-CM | POA: Diagnosis not present

## 2021-02-15 DIAGNOSIS — F339 Major depressive disorder, recurrent, unspecified: Secondary | ICD-10-CM | POA: Diagnosis not present

## 2021-02-15 DIAGNOSIS — M5459 Other low back pain: Secondary | ICD-10-CM | POA: Diagnosis not present

## 2021-02-16 DIAGNOSIS — F339 Major depressive disorder, recurrent, unspecified: Secondary | ICD-10-CM | POA: Diagnosis not present

## 2021-02-16 DIAGNOSIS — R41841 Cognitive communication deficit: Secondary | ICD-10-CM | POA: Diagnosis not present

## 2021-02-16 DIAGNOSIS — S72011D Unspecified intracapsular fracture of right femur, subsequent encounter for closed fracture with routine healing: Secondary | ICD-10-CM | POA: Diagnosis not present

## 2021-02-16 DIAGNOSIS — R2689 Other abnormalities of gait and mobility: Secondary | ICD-10-CM | POA: Diagnosis not present

## 2021-02-16 DIAGNOSIS — M6281 Muscle weakness (generalized): Secondary | ICD-10-CM | POA: Diagnosis not present

## 2021-02-16 DIAGNOSIS — M5459 Other low back pain: Secondary | ICD-10-CM | POA: Diagnosis not present

## 2021-02-17 DIAGNOSIS — R2689 Other abnormalities of gait and mobility: Secondary | ICD-10-CM | POA: Diagnosis not present

## 2021-02-17 DIAGNOSIS — R41841 Cognitive communication deficit: Secondary | ICD-10-CM | POA: Diagnosis not present

## 2021-02-17 DIAGNOSIS — M6281 Muscle weakness (generalized): Secondary | ICD-10-CM | POA: Diagnosis not present

## 2021-02-17 DIAGNOSIS — F339 Major depressive disorder, recurrent, unspecified: Secondary | ICD-10-CM | POA: Diagnosis not present

## 2021-02-17 DIAGNOSIS — M5459 Other low back pain: Secondary | ICD-10-CM | POA: Diagnosis not present

## 2021-02-17 DIAGNOSIS — S72011D Unspecified intracapsular fracture of right femur, subsequent encounter for closed fracture with routine healing: Secondary | ICD-10-CM | POA: Diagnosis not present

## 2021-02-18 DIAGNOSIS — R41841 Cognitive communication deficit: Secondary | ICD-10-CM | POA: Diagnosis not present

## 2021-02-18 DIAGNOSIS — M5459 Other low back pain: Secondary | ICD-10-CM | POA: Diagnosis not present

## 2021-02-18 DIAGNOSIS — F339 Major depressive disorder, recurrent, unspecified: Secondary | ICD-10-CM | POA: Diagnosis not present

## 2021-02-18 DIAGNOSIS — M6281 Muscle weakness (generalized): Secondary | ICD-10-CM | POA: Diagnosis not present

## 2021-02-18 DIAGNOSIS — S72011D Unspecified intracapsular fracture of right femur, subsequent encounter for closed fracture with routine healing: Secondary | ICD-10-CM | POA: Diagnosis not present

## 2021-02-18 DIAGNOSIS — R2689 Other abnormalities of gait and mobility: Secondary | ICD-10-CM | POA: Diagnosis not present

## 2021-02-19 DIAGNOSIS — M5459 Other low back pain: Secondary | ICD-10-CM | POA: Diagnosis not present

## 2021-02-19 DIAGNOSIS — M6281 Muscle weakness (generalized): Secondary | ICD-10-CM | POA: Diagnosis not present

## 2021-02-19 DIAGNOSIS — R2689 Other abnormalities of gait and mobility: Secondary | ICD-10-CM | POA: Diagnosis not present

## 2021-02-19 DIAGNOSIS — S72011D Unspecified intracapsular fracture of right femur, subsequent encounter for closed fracture with routine healing: Secondary | ICD-10-CM | POA: Diagnosis not present

## 2021-02-19 DIAGNOSIS — F339 Major depressive disorder, recurrent, unspecified: Secondary | ICD-10-CM | POA: Diagnosis not present

## 2021-02-19 DIAGNOSIS — R41841 Cognitive communication deficit: Secondary | ICD-10-CM | POA: Diagnosis not present

## 2021-02-22 DIAGNOSIS — R41841 Cognitive communication deficit: Secondary | ICD-10-CM | POA: Diagnosis not present

## 2021-02-22 DIAGNOSIS — S72011D Unspecified intracapsular fracture of right femur, subsequent encounter for closed fracture with routine healing: Secondary | ICD-10-CM | POA: Diagnosis not present

## 2021-02-22 DIAGNOSIS — F339 Major depressive disorder, recurrent, unspecified: Secondary | ICD-10-CM | POA: Diagnosis not present

## 2021-02-22 DIAGNOSIS — M6281 Muscle weakness (generalized): Secondary | ICD-10-CM | POA: Diagnosis not present

## 2021-02-22 DIAGNOSIS — M5459 Other low back pain: Secondary | ICD-10-CM | POA: Diagnosis not present

## 2021-02-22 DIAGNOSIS — R2689 Other abnormalities of gait and mobility: Secondary | ICD-10-CM | POA: Diagnosis not present

## 2021-02-23 DIAGNOSIS — F339 Major depressive disorder, recurrent, unspecified: Secondary | ICD-10-CM | POA: Diagnosis not present

## 2021-02-23 DIAGNOSIS — S72011D Unspecified intracapsular fracture of right femur, subsequent encounter for closed fracture with routine healing: Secondary | ICD-10-CM | POA: Diagnosis not present

## 2021-02-23 DIAGNOSIS — R41841 Cognitive communication deficit: Secondary | ICD-10-CM | POA: Diagnosis not present

## 2021-02-23 DIAGNOSIS — M6281 Muscle weakness (generalized): Secondary | ICD-10-CM | POA: Diagnosis not present

## 2021-02-23 DIAGNOSIS — M5459 Other low back pain: Secondary | ICD-10-CM | POA: Diagnosis not present

## 2021-02-23 DIAGNOSIS — R2689 Other abnormalities of gait and mobility: Secondary | ICD-10-CM | POA: Diagnosis not present

## 2021-02-24 DIAGNOSIS — R2689 Other abnormalities of gait and mobility: Secondary | ICD-10-CM | POA: Diagnosis not present

## 2021-02-24 DIAGNOSIS — M6281 Muscle weakness (generalized): Secondary | ICD-10-CM | POA: Diagnosis not present

## 2021-02-24 DIAGNOSIS — S72011D Unspecified intracapsular fracture of right femur, subsequent encounter for closed fracture with routine healing: Secondary | ICD-10-CM | POA: Diagnosis not present

## 2021-02-24 DIAGNOSIS — M5459 Other low back pain: Secondary | ICD-10-CM | POA: Diagnosis not present

## 2021-02-24 DIAGNOSIS — R41841 Cognitive communication deficit: Secondary | ICD-10-CM | POA: Diagnosis not present

## 2021-02-24 DIAGNOSIS — F339 Major depressive disorder, recurrent, unspecified: Secondary | ICD-10-CM | POA: Diagnosis not present

## 2021-02-25 ENCOUNTER — Other Ambulatory Visit: Payer: Self-pay

## 2021-02-25 ENCOUNTER — Encounter: Payer: Self-pay | Admitting: Internal Medicine

## 2021-02-25 ENCOUNTER — Ambulatory Visit (INDEPENDENT_AMBULATORY_CARE_PROVIDER_SITE_OTHER): Payer: Medicare Other | Admitting: Internal Medicine

## 2021-02-25 VITALS — BP 120/68 | HR 70 | Temp 98.6°F | Ht 64.0 in | Wt 88.0 lb

## 2021-02-25 DIAGNOSIS — F411 Generalized anxiety disorder: Secondary | ICD-10-CM | POA: Diagnosis not present

## 2021-02-25 DIAGNOSIS — S22000A Wedge compression fracture of unspecified thoracic vertebra, initial encounter for closed fracture: Secondary | ICD-10-CM

## 2021-02-25 DIAGNOSIS — E559 Vitamin D deficiency, unspecified: Secondary | ICD-10-CM

## 2021-02-25 DIAGNOSIS — M810 Age-related osteoporosis without current pathological fracture: Secondary | ICD-10-CM

## 2021-02-25 DIAGNOSIS — F0280 Dementia in other diseases classified elsewhere without behavioral disturbance: Secondary | ICD-10-CM | POA: Diagnosis not present

## 2021-02-25 DIAGNOSIS — G309 Alzheimer's disease, unspecified: Secondary | ICD-10-CM | POA: Diagnosis not present

## 2021-02-25 DIAGNOSIS — F03A3 Unspecified dementia, mild, with mood disturbance: Secondary | ICD-10-CM | POA: Diagnosis not present

## 2021-02-25 DIAGNOSIS — G4700 Insomnia, unspecified: Secondary | ICD-10-CM | POA: Diagnosis not present

## 2021-02-25 DIAGNOSIS — M545 Low back pain, unspecified: Secondary | ICD-10-CM | POA: Diagnosis not present

## 2021-02-25 DIAGNOSIS — R634 Abnormal weight loss: Secondary | ICD-10-CM

## 2021-02-25 DIAGNOSIS — F332 Major depressive disorder, recurrent severe without psychotic features: Secondary | ICD-10-CM | POA: Diagnosis not present

## 2021-02-25 LAB — COMPREHENSIVE METABOLIC PANEL
ALT: 13 U/L (ref 0–35)
AST: 21 U/L (ref 0–37)
Albumin: 4.1 g/dL (ref 3.5–5.2)
Alkaline Phosphatase: 61 U/L (ref 39–117)
BUN: 19 mg/dL (ref 6–23)
CO2: 31 mEq/L (ref 19–32)
Calcium: 9.9 mg/dL (ref 8.4–10.5)
Chloride: 101 mEq/L (ref 96–112)
Creatinine, Ser: 0.74 mg/dL (ref 0.40–1.20)
GFR: 75.42 mL/min (ref >60.00)
Glucose, Bld: 89 mg/dL (ref 70–99)
Potassium: 4.5 mEq/L (ref 3.5–5.1)
Sodium: 139 mEq/L (ref 135–145)
Total Bilirubin: 0.4 mg/dL (ref 0.2–1.2)
Total Protein: 7.3 g/dL (ref 6.0–8.3)

## 2021-02-25 LAB — TSH: TSH: 2.29 u[IU]/mL (ref 0.35–5.50)

## 2021-02-25 LAB — VITAMIN D 25 HYDROXY (VIT D DEFICIENCY, FRACTURES): VITD: 56.33 ng/mL (ref 30.00–100.00)

## 2021-02-25 MED ORDER — EVENITY 105 MG/1.17ML ~~LOC~~ SOSY: 210.0000 mg | PREFILLED_SYRINGE | Freq: Once | SUBCUTANEOUS | 10 refills | Status: AC

## 2021-02-25 NOTE — Assessment & Plan Note (Signed)
Chronic Currently at [facility]

## 2021-02-25 NOTE — Progress Notes (Signed)
Subjective:  Patient ID: Tanya Walker, female    DOB: 1940/10/26  Age: 81 y.o. MRN: [id number]  CC: No chief complaint on file.   HPI Tanya Walker presents for a possible CVA while at [facility]  Per hx:  "Hospital Course: Rakia was admitted to the inpatient geriatric psychiatry unit.  She presented very anxious and depressed.  She said that she was suicidal but had no plan.  She was mostly withdrawn while on the unit.  She did have some angry outbursts at times.  After a week she became more pleasant and cooperative.  She took her medications as prescribed and denied any side effects.  There was no evidence of EPS or TD.  Her sleep and appetite slightly improved, although she did lose some weight while on the unit.  She was receiving supplemental Ensure.  Her Lexapro was discontinued and she was started on Prozac.  Zyprexa was added at 5 mg at bedtime.  She tolerated the medications.  On the day of discharge she denied suicidal ideation, homicidal ideation, or auditory or visual hallucinations."  F/u on dementia Here w/[name]-he helps with history  Outpatient Medications Prior to Visit  Medication Sig Dispense Refill   acetaminophen (TYLENOL) 500 MG tablet Take 1,000 mg by mouth every 6 (six) hours as needed for moderate pain, mild pain, headache or fever.     ASPIRIN 81 PO Take 1 tablet by mouth daily.     Calcium Carbonate (CALCIUM-CARB 600 PO) Take 1 tablet by mouth 2 (two) times daily.     cetirizine (ZYRTEC ALLERGY) 10 MG tablet Take 1 tablet (10 mg total) by mouth daily. 30 tablet 11   Cholecalciferol (VITAMIN D3) 2000 units capsule Take 1 capsule (2,000 Units total) by mouth daily. 100 capsule 3   Cobalamin Combinations (VITAMIN B12-FOLIC ACID PO) Place 1 lozenge under the tongue in the morning.     docusate sodium (COLACE) 100 MG capsule Take 100 mg by mouth at bedtime.     FLUoxetine (PROZAC) 20 MG capsule Take 1 capsule (20 mg total) by mouth at bedtime. 30  capsule 3   gabapentin (NEURONTIN) 100 MG capsule Take 1 capsule (100 mg total) by mouth 3 (three) times daily. 90 capsule 3   KETOCONAZOLE, TOPICAL, (NIZORAL A-D) 1 % SHAM Apply 1 application topically once a week. Wednesday     melatonin 5 MG TABS Take 5 mg by mouth at bedtime.     Multiple Vitamins-Minerals (PRESERVISION AREDS 2 PO) Take 1 capsule by mouth 2 (two) times daily.     OLANZapine (ZYPREXA) 5 MG tablet Take 1 tablet (5 mg total) by mouth at bedtime. 30 tablet 3   polyethylene glycol (MIRALAX / GLYCOLAX) 17 g packet Take 17 g by mouth daily as needed for moderate constipation or mild constipation. (Patient taking differently: Take 17 g by mouth daily.) 14 each 0   SALONPAS 3.02-13-08 % PTCH Apply 1 patch topically See admin instructions. Apply 1 patch to the lower back and remove (at most) after 8 hours- once a day as needed for pain     senna-docusate (SENOKOT-S) 8.6-50 MG tablet Take 2 tablets by mouth daily.     sodium phosphate (FLEET) 7-19 GM/118ML ENEM Place 1 enema rectally daily as needed for severe constipation or mild constipation.     No facility-administered medications prior to visit.    ROS: Review of Systems  Constitutional:  Positive for fatigue. Negative for activity change, appetite change, chills  and unexpected weight change.  HENT:  Negative for congestion, mouth sores and sinus pressure.   Eyes:  Negative for visual disturbance.  Respiratory:  Negative for cough and chest tightness.   Gastrointestinal:  Negative for abdominal distention, abdominal pain and nausea.  Genitourinary:  Negative for difficulty urinating, frequency and vaginal pain.  Musculoskeletal:  Positive for back pain. Negative for gait problem.  Skin:  Negative for pallor and rash.  Neurological:  Negative for dizziness, tremors, weakness, numbness and headaches.  Hematological:  Does not bruise/bleed easily.  Psychiatric/Behavioral:  Positive for confusion, decreased concentration and  dysphoric mood. Negative for behavioral problems, self-injury, sleep disturbance and suicidal ideas. The patient is nervous/anxious.    Objective:  BP 120/68 (BP Location: Left Arm, Patient Position: Sitting, Cuff Size: Large)    Pulse 70    Temp 98.6 F (37 C) (Oral)    Ht 5\' 4"  (1.626 m)    Wt 88 lb (39.9 kg)    SpO2 97%    BMI 15.11 kg/m   BP Readings from Last 3 Encounters:  02/25/21 120/68  02/12/21 111/78  01/19/21 (!) 160/80    Wt Readings from Last 3 Encounters:  02/25/21 88 lb (39.9 kg)  02/12/21 86 lb 1.6 oz (39.1 kg)  01/19/21 82 lb 9.6 oz (37.5 kg)    Physical Exam Constitutional:      General: She is not in acute distress.    Appearance: She is well-developed. She is obese.  HENT:     Head: Normocephalic.     Right Ear: External ear normal.     Left Ear: External ear normal.     Nose: Nose normal.  Eyes:     General:        Right eye: No discharge.        Left eye: No discharge.     Conjunctiva/sclera: Conjunctivae normal.     Pupils: Pupils are equal, round, and reactive to light.  Neck:     Thyroid: No thyromegaly.     Vascular: No JVD.     Trachea: No tracheal deviation.  Cardiovascular:     Rate and Rhythm: Normal rate and regular rhythm.     Heart sounds: Normal heart sounds.  Pulmonary:     Effort: No respiratory distress.     Breath sounds: No stridor. No wheezing.  Abdominal:     General: Bowel sounds are normal. There is no distension.     Palpations: Abdomen is soft. There is no mass.     Tenderness: There is no abdominal tenderness. There is no guarding or rebound.  Musculoskeletal:        General: Tenderness present.     Cervical back: Normal range of motion and neck supple. No rigidity.  Lymphadenopathy:     Cervical: No cervical adenopathy.  Skin:    Findings: No erythema or rash.  Neurological:     Mental Status: She is disoriented.     Cranial Nerves: No cranial nerve deficit.     Motor: No abnormal muscle tone.     Coordination:  Coordination abnormal.     Deep Tendon Reflexes: Reflexes normal.  Psychiatric:        Behavior: Behavior normal.        Thought Content: Thought content normal.        Judgment: Judgment normal.   Using a walker Thin LS/thor spine    A total time of 45 minutes was spent preparing to see the patient, reviewing tests, x-rays,  operative reports and other medical records.  Also, obtaining history and performing comprehensive physical exam.  Additionally, counseling the patient regarding the above listed issues.   Finally, documenting clinical information in the health records, coordination of care, educating the patient and her husband regarding osteoporosis treatment. It is a complex case.  Lab Results  Component Value Date   WBC 4.3 12/27/2020   HGB 12.0 12/27/2020   HCT 36.8 12/27/2020   PLT 158 12/27/2020   GLUCOSE 102 (H) 12/27/2020   CHOL 182 01/06/2021   TRIG 37 01/06/2021   HDL 58 01/06/2021   LDLDIRECT 135.4 12/26/2008   LDLCALC 117 (H) 01/06/2021   ALT 15 12/27/2020   AST 17 12/27/2020   NA 140 12/27/2020   K 4.1 12/27/2020   CL 105 12/27/2020   CREATININE 0.52 12/27/2020   BUN 29 (H) 12/27/2020   CO2 31 12/27/2020   TSH 2.12 05/21/2020   INR 1.0 08/19/2020   HGBA1C 5.5 01/06/2021    No results found.  Assessment & Plan:   Problem List Items Addressed This Visit     Alzheimer disease ([place])    Chronic Currently at [facility]      Compression fracture of body of thoracic vertebra ([place])    Start Evenity if affordable Vit D and calcium      Low back pain    Start Evenity if affordable Vit D and calcium      Osteoporosis    Start Evenity if affordable Vit D and calcium      Relevant Medications   Romosozumab-aqqg (EVENITY) 105 MG/1.17ML SOSY injection      Meds ordered this encounter  Medications   Romosozumab-aqqg (EVENITY) 105 MG/1.17ML SOSY injection    Sig: Inject 210 mg into the skin once for 1 dose.    Dispense:  2.4 mL     Refill:  10    Compression fractures, spine. Osteoporosis      Follow-up: No follow-ups on file.  Walker Kehr, MD

## 2021-02-25 NOTE — Assessment & Plan Note (Signed)
Start Evenity if affordable Vit D and calcium

## 2021-02-26 DIAGNOSIS — F339 Major depressive disorder, recurrent, unspecified: Secondary | ICD-10-CM | POA: Diagnosis not present

## 2021-02-26 DIAGNOSIS — M6281 Muscle weakness (generalized): Secondary | ICD-10-CM | POA: Diagnosis not present

## 2021-02-26 DIAGNOSIS — S72011D Unspecified intracapsular fracture of right femur, subsequent encounter for closed fracture with routine healing: Secondary | ICD-10-CM | POA: Diagnosis not present

## 2021-02-26 DIAGNOSIS — M5459 Other low back pain: Secondary | ICD-10-CM | POA: Diagnosis not present

## 2021-02-26 DIAGNOSIS — R41841 Cognitive communication deficit: Secondary | ICD-10-CM | POA: Diagnosis not present

## 2021-02-26 DIAGNOSIS — R2689 Other abnormalities of gait and mobility: Secondary | ICD-10-CM | POA: Diagnosis not present

## 2021-03-01 DIAGNOSIS — M6281 Muscle weakness (generalized): Secondary | ICD-10-CM | POA: Diagnosis not present

## 2021-03-01 DIAGNOSIS — M5459 Other low back pain: Secondary | ICD-10-CM | POA: Diagnosis not present

## 2021-03-01 DIAGNOSIS — F339 Major depressive disorder, recurrent, unspecified: Secondary | ICD-10-CM | POA: Diagnosis not present

## 2021-03-01 DIAGNOSIS — R2689 Other abnormalities of gait and mobility: Secondary | ICD-10-CM | POA: Diagnosis not present

## 2021-03-01 DIAGNOSIS — R41841 Cognitive communication deficit: Secondary | ICD-10-CM | POA: Diagnosis not present

## 2021-03-01 DIAGNOSIS — S72011D Unspecified intracapsular fracture of right femur, subsequent encounter for closed fracture with routine healing: Secondary | ICD-10-CM | POA: Diagnosis not present

## 2021-03-02 DIAGNOSIS — S72011D Unspecified intracapsular fracture of right femur, subsequent encounter for closed fracture with routine healing: Secondary | ICD-10-CM | POA: Diagnosis not present

## 2021-03-02 DIAGNOSIS — M6281 Muscle weakness (generalized): Secondary | ICD-10-CM | POA: Diagnosis not present

## 2021-03-02 DIAGNOSIS — M5459 Other low back pain: Secondary | ICD-10-CM | POA: Diagnosis not present

## 2021-03-02 DIAGNOSIS — L570 Actinic keratosis: Secondary | ICD-10-CM | POA: Diagnosis not present

## 2021-03-02 DIAGNOSIS — L814 Other melanin hyperpigmentation: Secondary | ICD-10-CM | POA: Diagnosis not present

## 2021-03-02 DIAGNOSIS — R2689 Other abnormalities of gait and mobility: Secondary | ICD-10-CM | POA: Diagnosis not present

## 2021-03-02 DIAGNOSIS — R41841 Cognitive communication deficit: Secondary | ICD-10-CM | POA: Diagnosis not present

## 2021-03-02 DIAGNOSIS — F339 Major depressive disorder, recurrent, unspecified: Secondary | ICD-10-CM | POA: Diagnosis not present

## 2021-03-03 ENCOUNTER — Telehealth: Payer: Self-pay | Admitting: *Deleted

## 2021-03-03 DIAGNOSIS — M5459 Other low back pain: Secondary | ICD-10-CM | POA: Diagnosis not present

## 2021-03-03 DIAGNOSIS — R2689 Other abnormalities of gait and mobility: Secondary | ICD-10-CM | POA: Diagnosis not present

## 2021-03-03 DIAGNOSIS — M6281 Muscle weakness (generalized): Secondary | ICD-10-CM | POA: Diagnosis not present

## 2021-03-03 DIAGNOSIS — S72011D Unspecified intracapsular fracture of right femur, subsequent encounter for closed fracture with routine healing: Secondary | ICD-10-CM | POA: Diagnosis not present

## 2021-03-03 DIAGNOSIS — R41841 Cognitive communication deficit: Secondary | ICD-10-CM | POA: Diagnosis not present

## 2021-03-03 DIAGNOSIS — F339 Major depressive disorder, recurrent, unspecified: Secondary | ICD-10-CM | POA: Diagnosis not present

## 2021-03-03 NOTE — Telephone Encounter (Signed)
Forwarding msg to Josepha Pigg, CMA to check eligibility...Johny Chess

## 2021-03-03 NOTE — Telephone Encounter (Signed)
-----   Message from Cassandria Anger, MD sent at 03/03/2021  7:35 AM EST ----- Lorre Nick, Can we arrange for Evenity injections monthly for 12 months in our office, please.  Diagnosis: osteoporosis, compression fractures Thanks -------------------------------------------------------------------  Vanita Panda, Of course.  No problem.  You are correct. Thank you, AP ===View-only below this line=== ----- Message ----- From: Virgie Dad, MD Sent: 03/02/2021  12:38 PM EST To: Cassandria Anger, MD  Hello Dr [name], I am taking care of Currie Paris in [facility].  I just want to let you know that it is going to cost her almost $1400 to get Evenity injection in facility We are talking to the Daughter but I feel she would be better off  getting it in your office.  She will [place] soon going back [place] apartment and will follow with you in office. Probably that would be good time to arrange for this. Let me know what you think. Thanks Anjali

## 2021-03-04 DIAGNOSIS — R2689 Other abnormalities of gait and mobility: Secondary | ICD-10-CM | POA: Diagnosis not present

## 2021-03-04 DIAGNOSIS — F339 Major depressive disorder, recurrent, unspecified: Secondary | ICD-10-CM | POA: Diagnosis not present

## 2021-03-04 DIAGNOSIS — S72011D Unspecified intracapsular fracture of right femur, subsequent encounter for closed fracture with routine healing: Secondary | ICD-10-CM | POA: Diagnosis not present

## 2021-03-04 DIAGNOSIS — M6281 Muscle weakness (generalized): Secondary | ICD-10-CM | POA: Diagnosis not present

## 2021-03-04 DIAGNOSIS — M5459 Other low back pain: Secondary | ICD-10-CM | POA: Diagnosis not present

## 2021-03-04 DIAGNOSIS — R41841 Cognitive communication deficit: Secondary | ICD-10-CM | POA: Diagnosis not present

## 2021-03-05 DIAGNOSIS — R41841 Cognitive communication deficit: Secondary | ICD-10-CM | POA: Diagnosis not present

## 2021-03-05 DIAGNOSIS — F339 Major depressive disorder, recurrent, unspecified: Secondary | ICD-10-CM | POA: Diagnosis not present

## 2021-03-05 DIAGNOSIS — R2689 Other abnormalities of gait and mobility: Secondary | ICD-10-CM | POA: Diagnosis not present

## 2021-03-05 DIAGNOSIS — M5459 Other low back pain: Secondary | ICD-10-CM | POA: Diagnosis not present

## 2021-03-05 DIAGNOSIS — M6281 Muscle weakness (generalized): Secondary | ICD-10-CM | POA: Diagnosis not present

## 2021-03-05 DIAGNOSIS — S72011D Unspecified intracapsular fracture of right femur, subsequent encounter for closed fracture with routine healing: Secondary | ICD-10-CM | POA: Diagnosis not present

## 2021-03-08 DIAGNOSIS — M5459 Other low back pain: Secondary | ICD-10-CM | POA: Diagnosis not present

## 2021-03-08 DIAGNOSIS — M6281 Muscle weakness (generalized): Secondary | ICD-10-CM | POA: Diagnosis not present

## 2021-03-08 DIAGNOSIS — R2689 Other abnormalities of gait and mobility: Secondary | ICD-10-CM | POA: Diagnosis not present

## 2021-03-08 DIAGNOSIS — R41841 Cognitive communication deficit: Secondary | ICD-10-CM | POA: Diagnosis not present

## 2021-03-08 DIAGNOSIS — S72011D Unspecified intracapsular fracture of right femur, subsequent encounter for closed fracture with routine healing: Secondary | ICD-10-CM | POA: Diagnosis not present

## 2021-03-08 DIAGNOSIS — F339 Major depressive disorder, recurrent, unspecified: Secondary | ICD-10-CM | POA: Diagnosis not present

## 2021-03-08 NOTE — Telephone Encounter (Signed)
Prolia VOB initiated via [email or web address]  Last OV:  Next OV:  Last Prolia inj: NEW START Next Prolia inj DUE:   

## 2021-03-09 DIAGNOSIS — M5459 Other low back pain: Secondary | ICD-10-CM | POA: Diagnosis not present

## 2021-03-09 DIAGNOSIS — R2689 Other abnormalities of gait and mobility: Secondary | ICD-10-CM | POA: Diagnosis not present

## 2021-03-09 DIAGNOSIS — S72011D Unspecified intracapsular fracture of right femur, subsequent encounter for closed fracture with routine healing: Secondary | ICD-10-CM | POA: Diagnosis not present

## 2021-03-09 DIAGNOSIS — F339 Major depressive disorder, recurrent, unspecified: Secondary | ICD-10-CM | POA: Diagnosis not present

## 2021-03-09 DIAGNOSIS — M6281 Muscle weakness (generalized): Secondary | ICD-10-CM | POA: Diagnosis not present

## 2021-03-09 DIAGNOSIS — R41841 Cognitive communication deficit: Secondary | ICD-10-CM | POA: Diagnosis not present

## 2021-03-10 DIAGNOSIS — S72011D Unspecified intracapsular fracture of right femur, subsequent encounter for closed fracture with routine healing: Secondary | ICD-10-CM | POA: Diagnosis not present

## 2021-03-10 DIAGNOSIS — R2689 Other abnormalities of gait and mobility: Secondary | ICD-10-CM | POA: Diagnosis not present

## 2021-03-10 DIAGNOSIS — F339 Major depressive disorder, recurrent, unspecified: Secondary | ICD-10-CM | POA: Diagnosis not present

## 2021-03-10 DIAGNOSIS — Z9181 History of falling: Secondary | ICD-10-CM | POA: Diagnosis not present

## 2021-03-10 DIAGNOSIS — M6281 Muscle weakness (generalized): Secondary | ICD-10-CM | POA: Diagnosis not present

## 2021-03-10 DIAGNOSIS — M5459 Other low back pain: Secondary | ICD-10-CM | POA: Diagnosis not present

## 2021-03-10 DIAGNOSIS — R41841 Cognitive communication deficit: Secondary | ICD-10-CM | POA: Diagnosis not present

## 2021-03-10 NOTE — Telephone Encounter (Signed)
Pt ready for scheduling on or after 03/10/21  Out-of-pocket cost due at time of visit: $0  Primary: Medicare Evenity co-insurance: 20% (approximately $276) Admin fee co-insurance: 20% (approximately $25)  Secondary: BCBS  Med Supp Prolia co-insurance: Covers Medicare Part B co-insurance and deductible.  Admin fee co-insurance: Covers Medicare Part B co-insurance and deductible.   Deductible: covered by secondary  Prior Auth: not required PA# Valid:   ** This summary of benefits is an estimation of the patient's out-of-pocket cost. Exact cost may vary based on individual plan coverage.

## 2021-03-11 DIAGNOSIS — R2689 Other abnormalities of gait and mobility: Secondary | ICD-10-CM | POA: Diagnosis not present

## 2021-03-11 DIAGNOSIS — R41841 Cognitive communication deficit: Secondary | ICD-10-CM | POA: Diagnosis not present

## 2021-03-11 DIAGNOSIS — M5459 Other low back pain: Secondary | ICD-10-CM | POA: Diagnosis not present

## 2021-03-11 DIAGNOSIS — M6281 Muscle weakness (generalized): Secondary | ICD-10-CM | POA: Diagnosis not present

## 2021-03-11 DIAGNOSIS — S72011D Unspecified intracapsular fracture of right femur, subsequent encounter for closed fracture with routine healing: Secondary | ICD-10-CM | POA: Diagnosis not present

## 2021-03-11 DIAGNOSIS — F339 Major depressive disorder, recurrent, unspecified: Secondary | ICD-10-CM | POA: Diagnosis not present

## 2021-03-12 ENCOUNTER — Non-Acute Institutional Stay (SKILLED_NURSING_FACILITY): Payer: Medicare Other | Admitting: Nurse Practitioner

## 2021-03-12 ENCOUNTER — Encounter: Payer: Self-pay | Admitting: Nurse Practitioner

## 2021-03-12 DIAGNOSIS — E559 Vitamin D deficiency, unspecified: Secondary | ICD-10-CM

## 2021-03-12 DIAGNOSIS — E538 Deficiency of other specified B group vitamins: Secondary | ICD-10-CM

## 2021-03-12 DIAGNOSIS — F32A Depression, unspecified: Secondary | ICD-10-CM

## 2021-03-12 DIAGNOSIS — K5901 Slow transit constipation: Secondary | ICD-10-CM | POA: Diagnosis not present

## 2021-03-12 DIAGNOSIS — D696 Thrombocytopenia, unspecified: Secondary | ICD-10-CM | POA: Diagnosis not present

## 2021-03-12 DIAGNOSIS — J3089 Other allergic rhinitis: Secondary | ICD-10-CM

## 2021-03-12 DIAGNOSIS — M810 Age-related osteoporosis without current pathological fracture: Secondary | ICD-10-CM | POA: Diagnosis not present

## 2021-03-12 DIAGNOSIS — M545 Low back pain, unspecified: Secondary | ICD-10-CM | POA: Diagnosis not present

## 2021-03-12 DIAGNOSIS — I10 Essential (primary) hypertension: Secondary | ICD-10-CM

## 2021-03-12 DIAGNOSIS — R627 Adult failure to thrive: Secondary | ICD-10-CM | POA: Diagnosis not present

## 2021-03-12 DIAGNOSIS — E785 Hyperlipidemia, unspecified: Secondary | ICD-10-CM

## 2021-03-12 DIAGNOSIS — F0280 Dementia in other diseases classified elsewhere without behavioral disturbance: Secondary | ICD-10-CM

## 2021-03-12 DIAGNOSIS — G309 Alzheimer's disease, unspecified: Secondary | ICD-10-CM | POA: Diagnosis not present

## 2021-03-12 DIAGNOSIS — R45851 Suicidal ideations: Secondary | ICD-10-CM

## 2021-03-12 NOTE — Progress Notes (Signed)
Location:   [facility] Room Number: 30 Place of Service:  SNF (31) Provider:  Janylah Belgrave, Lennie Odor NP  Plotnikov, [name], MD  Patient Care Team: Cassandria Anger, MD as PCP - General (Internal Medicine)  Extended Emergency Contact Information Primary Emergency Contact: Burman Blacksmith Address: [address]          [place], [place] [address] [place] Phone: [phone] Relation: Spouse Secondary Emergency Contact: [facility] Phone: [phone] Relation: Daughter  Code Status:  Full Code Goals of care: Advanced Directive information Advanced Directives 03/12/2021  Does Patient Have a Medical Advance Directive? No  Type of Advance Directive -  Does patient want to make changes to medical advance directive? No - Patient declined  Copy of [place] in Chart? -  Would patient like information on creating a medical advance directive? -  Some encounter information is confidential and restricted. Go to Review Flowsheets activity to see all data.     Chief Complaint  Patient presents with   Medical Management of Chronic Issues    HPI:  Pt is a 81 y.o. female seen today for medical management of chronic diseases.             OP, pending Evenity              Anemia, Hgb 12 12/27/20             HTN, off Amlodipine. Bun/creat 19/0.74 02/25/21             Constipation, takes Senokot S, MiraLax.              Alzheimer's Dementia, Vit B12 499, TSH 2.29 02/25/21, SNF [facility] for supportive care. MMSE 28/30 08/26/20             Vit B12 deficiency, Vit B12 level 499 05/21/20, takes Vit B12             Vitamin D deficiency, takes Vit d daily, Vit D 56.33 02/25/21.              Allergic rhinitis, takes Zyrtec.             Hyperlipidemia, diet, LDL 117 01/06/21             Depression, hx of suicidal attempt/ideations, anxious at times,  on Prozac, Zyprexa. Mirtazapine made her AMS             Chronic back pain, stable, T11, L1  compression fx, takes Tylenol, Gabapentin             Weight loss/adult failure to thrive, weight is stable, didn't tolerate Mirtazapine.             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   Depression    years ago   Headache    sinus headaches   Lipoma of arm    Right   Medical history non-contributory    Osteoporosis    SVD (spontaneous vaginal delivery)    x 2   Varicose vein    Vitamin D deficiency    Past Surgical History:  Procedure Laterality Date   ANTERIOR APPROACH HEMI HIP ARTHROPLASTY Right 09/04/2020   Procedure: ANTERIOR APPROACH HEMI HIP ARTHROPLASTY;  Surgeon: Rod Can, MD;  Location: [facility];  Service: Orthopedics;  Laterality:  Right;   [place]   COLONOSCOPY     GANGLION CYST EXCISION     x2 left and right arm   HYSTEROSCOPY WITH D & C N/A 07/15/2013   Procedure: DILATATION AND CURETTAGE /HYSTEROSCOPY;  Surgeon: Lovenia Kim, MD;  Location: [facility];  Service: Gynecology;  Laterality: N/A;   KYPHOPLASTY Bilateral 05/04/2020   Procedure: KYPHOPLASTY THORACIC TWELVE;  Surgeon: Vallarie Mare, MD;  Location: [facility];  Service: Neurosurgery;  Laterality: Bilateral;   MANDIBLE SURGERY  1954   WISDOM TOOTH EXTRACTION      Allergies  Allergen Reactions   Alendronate Sodium Other (See Comments)    Leg cramps and "Allergic," per MAR   Risedronate Sodium Other (See Comments)    Caused the patient to be achy and is "Allergic," per MAR   Tramadol Other (See Comments)    Caused the patient to feel badly    Keflex [Cephalexin] Other (See Comments)    Possible rash    Allergies as of 03/12/2021       Reactions   Alendronate Sodium Other (See Comments)   Leg cramps and "Allergic," per MAR   Risedronate Sodium Other (See Comments)   Caused the patient to be achy and is "Allergic," per MAR   Tramadol Other (See Comments)    Caused the patient to feel badly    Keflex [cephalexin] Other (See Comments)   Possible rash        Medication List        Accurate as of March 12, 2021 11:59 PM. If you have any questions, ask your nurse or doctor.          acetaminophen 500 MG tablet Commonly known as: TYLENOL Take 1,000 mg by mouth every 6 (six) hours as needed for moderate pain, mild pain, headache or fever.   ASPIRIN 81 PO Take 1 tablet by mouth daily.   CALCIUM-CARB 600 PO Take 1 tablet by mouth 2 (two) times daily.   cetirizine 10 MG tablet Commonly known as: ZyrTEC Allergy Take 1 tablet (10 mg total) by mouth daily.   docusate sodium 100 MG capsule Commonly known as: COLACE Take 100 mg by mouth at bedtime.   Evenity 105 MG/1.17ML Sosy injection Generic drug: Romosozumab-aqqg Inject 210 mg into the skin once.   FLUoxetine 20 MG capsule Commonly known as: PROZAC Take 1 capsule (20 mg total) by mouth at bedtime.   gabapentin 100 MG capsule Commonly known as: NEURONTIN Take 1 capsule (100 mg total) by mouth 3 (three) times daily.   melatonin 5 MG Tabs Take 5 mg by mouth at bedtime.   Nizoral A-D 1 % Sham Generic drug: KETOCONAZOLE (TOPICAL) Apply 1 application topically once a week. Wednesday   OLANZapine 5 MG tablet Commonly known as: ZYPREXA Take 1 tablet (5 mg total) by mouth at bedtime.   polyethylene glycol 17 g packet Commonly known as: MIRALAX / GLYCOLAX Take 17 g by mouth daily as needed for moderate constipation or mild constipation. What changed: when to take this   PRESERVISION AREDS 2 PO Take 1 capsule by mouth 2 (two) times daily.   Salonpas 3.02-13-08 % Ptch Generic drug: Camphor-Menthol-Methyl Sal Apply 1 patch topically See admin instructions. Apply 1 patch to the lower back and remove (at most) after 8 hours- once a day as needed for pain   senna-docusate 8.6-50 MG tablet Commonly known as: Senokot-S Take 2 tablets by mouth daily.   sodium phosphate  7-19 GM/118ML Enem Place 1 enema rectally daily as needed for severe constipation or mild constipation.   VITAMIN B12-FOLIC ACID PO Place 1 lozenge under the tongue in the morning.   Vitamin D3 50 MCG (2000 UT) capsule Take 1 capsule (2,000 Units total) by mouth daily.        Review of Systems  Constitutional:  Negative for activity change, appetite change and fever.  HENT:  Positive for hearing loss. Negative for congestion and voice change.   Eyes:  Negative for visual disturbance.  Respiratory:  Negative for cough and shortness of breath.   Cardiovascular:  Negative for leg swelling.  Gastrointestinal:  Negative for abdominal pain and constipation.  Genitourinary:  Negative for dysuria and urgency.  Musculoskeletal:  Positive for arthralgias, back pain and gait problem.  Skin:  Negative for color change.  Neurological:  Negative for speech difficulty, weakness and headaches.       Dementia.   Psychiatric/Behavioral:  Positive for dysphoric mood and sleep disturbance. Negative for confusion and hallucinations. The patient is nervous/anxious.    Immunization History  Administered Date(s) Administered   DT (Pediatric) 04/10/2002   Fluad Quad(high Dose 65+) 10/28/2019   Influenza Split 03/08/2011, 11/17/2011   Influenza Whole 01/09/2007, 11/05/2008, 11/04/2009   Influenza, High Dose Seasonal PF 12/07/2015, 09/29/2016, 11/09/2017, 09/27/2018, 10/28/2019   Influenza-Unspecified 10/30/2012, 11/29/2013, 12/02/2014, 11/25/2020   Moderna Sars-Covid-2 Vaccination 02/08/2019, 03/11/2019   PFIZER(Purple Top)SARS-COV-2 Vaccination 10/09/2019   PPD Test 12/22/2015, 01/05/2016   Pneumococcal Conjugate-13 07/17/2014   Pneumococcal Polysaccharide-23 08/15/2005, 09/25/2015   Tetanus 12/24/2012   Unspecified SARS-COV-2 Vaccination 02/11/2019, 12/17/2019, 10/27/2020   Zoster Recombinat (Shingrix) 06/07/2019, 09/06/2019   Zoster, Live 12/05/2013   Pertinent  Health Maintenance Due  Topic  Date Due   INFLUENZA VACCINE  Completed   DEXA SCAN  Completed   Fall Risk 12/27/2020 12/28/2020 12/28/2020 12/29/2020 12/30/2020  Falls in the past year? - - - - -  Was there an injury with Fall? - - - - -  Fall Risk Category Calculator - - - - -  Fall Risk Category - - - - -  Patient Fall Risk Level Moderate fall risk Moderate fall risk High fall risk High fall risk High fall risk  Patient at Risk for Falls Due to - - - - -  Fall risk Follow up - - - - -  Some encounter information is confidential and restricted. Go to Review Flowsheets activity to see all data.   Functional Status Survey:    Vitals:   03/12/21 1032  BP: (!) 118/55  Pulse: 79  Resp: 20  Temp: 97.9 F (36.6 C)  SpO2: 96%  Weight: 86 lb 4.8 oz (39.1 kg)  Height: 5\' 4"  (1.626 m)   Body mass index is 14.81 kg/m. Physical Exam Vitals and nursing note reviewed.  Constitutional:      Appearance: Normal appearance.  HENT:     Head: Normocephalic.     Nose: Nose normal.     Mouth/Throat:     Mouth: Mucous membranes are moist.  Eyes:     Conjunctiva/sclera: Conjunctivae normal.     Pupils: Pupils are equal, round, and reactive to light.  Cardiovascular:     Rate and Rhythm: Normal rate.     Heart sounds: No murmur heard. Pulmonary:     Effort: Pulmonary effort is normal.     Breath sounds: No rales.  Abdominal:     General: Bowel sounds are normal.     Palpations: Abdomen  is soft.     Tenderness: There is no abdominal tenderness.  Musculoskeletal:     Cervical back: Normal range of motion and neck supple.     Right lower leg: No edema.     Left lower leg: No edema.     Comments: Right hip s/p ORIF 09/08/20, healed.   Skin:    General: Skin is warm and dry.  Neurological:     General: No focal deficit present.     Mental Status: She is alert and oriented to person, place, and time. Mental status is at baseline.     Motor: No weakness.     Coordination: Coordination normal.     Gait: Gait abnormal.   Psychiatric:        Behavior: Behavior normal.     Comments: Sad facial looks, appears anxious of planning to get her driver's license soon.     Labs reviewed: Recent Labs    08/18/20 2330 08/19/20 0429 09/03/20 0716 09/06/20 0533 09/18/20 0000 11/11/20 0000 12/27/20 1410 02/25/21 1533  NA  --  137   < > 135   < > 139 140 139  K  --  3.6   < > 3.5   < > 4.1 4.1 4.5  CL  --  104   < > 102   < > 104 105 101  CO2  --  24   < > 27   < > 28* 31 31  GLUCOSE  --  80   < > 95  --   --  102* 89  BUN  --  10   < > 18   < > 18 29* 19  CREATININE  --  <0.30*   < > 0.40*   < > 0.6 0.52 0.74  CALCIUM  --  8.6*   < > 8.7*   < > 8.6* 8.8* 9.9  MG 2.2 2.1  --   --   --   --   --   --   PHOS  --  3.1  --   --   --   --   --   --    < > = values in this interval not displayed.   Recent Labs    08/19/20 0429 09/18/20 0000 09/22/20 0000 11/11/20 0000 12/27/20 1410 02/25/21 1533  AST 22   < > 26 14 17 21   ALT 18   < > 28 9 15 13   ALKPHOS 86   < > 112 57 53 61  BILITOT 1.0  --   --   --  0.4 0.4  PROT 6.4*  --   --   --  6.0* 7.3  ALBUMIN 3.9   < > 3.0*  --  3.3* 4.1   < > = values in this interval not displayed.   Recent Labs    09/06/20 0533 09/07/20 0400 09/18/20 0000 09/22/20 0000 11/11/20 0000 12/27/20 1410  WBC 9.6 8.1   < > 5.7 4.1 4.3  NEUTROABS  --   --    < > 4,252.00 2,362.00 2.3  HGB 11.1* 10.1*   < > 11.3* 10.6* 12.0  HCT 34.1* 30.1*   < > 35* 33* 36.8  MCV 93.2 92.0  --   --   --  95.8  PLT 121* 136*  --  342 136* 158   < > = values in this interval not displayed.   Lab Results  Component Value Date   TSH 2.29 02/25/2021  Lab Results  Component Value Date   HGBA1C 5.5 01/06/2021   Lab Results  Component Value Date   CHOL 182 01/06/2021   HDL 58 01/06/2021   LDLCALC 117 (H) 01/06/2021   LDLDIRECT 135.4 12/26/2008   TRIG 37 01/06/2021   CHOLHDL 3.1 01/06/2021    Significant Diagnostic Results in last 30 days:  No results  found.  Assessment/Plan Alzheimer disease ([place])  Vit B12 499, TSH 2.29 02/25/21, SNF [facility] for supportive care. MMSE 28/30 08/26/20  Vitamin B12 deficiency Vit B12 level 499 05/21/20, takes Vit B12  Vitamin D deficiency  takes Vit d daily, Vit D 56.33 02/25/21.   Allergic rhinitis Stable,  takes Zyrtec.  Hyperlipidemia , diet, LDL 117 01/06/21  Depression with suicidal ideation hx of suicidal attempt/ideations, anxious at times,  on Prozac, Zyprexa. Mirtazapine made her AMS  Low back pain stable, T11, L1 compression fx, takes Tylenol, Gabapentin  Failure to thrive in adult  weight is stable, didn't tolerate Mirtazapine.  Thrombocytopenia ([facility]) plt 158 12/27/20  Slow transit constipation Stable, takes Senokot S, MiraLax.   HTN (hypertension) Blood pressure is controlled, off Amlodipine. Bun/creat 19/0.74 02/25/21  Osteoporosis Pending Evenity, 02/25/21 Dr. [name] recommended  Evenity inj monthly     Family/ staff Communication: plan of care reviewed with the patent and charge nurse.   Labs/tests ordered:  none  Time spend 35 minutes.

## 2021-03-13 DIAGNOSIS — M5459 Other low back pain: Secondary | ICD-10-CM | POA: Diagnosis not present

## 2021-03-13 DIAGNOSIS — S72011D Unspecified intracapsular fracture of right femur, subsequent encounter for closed fracture with routine healing: Secondary | ICD-10-CM | POA: Diagnosis not present

## 2021-03-13 DIAGNOSIS — F339 Major depressive disorder, recurrent, unspecified: Secondary | ICD-10-CM | POA: Diagnosis not present

## 2021-03-13 DIAGNOSIS — R2689 Other abnormalities of gait and mobility: Secondary | ICD-10-CM | POA: Diagnosis not present

## 2021-03-13 DIAGNOSIS — M6281 Muscle weakness (generalized): Secondary | ICD-10-CM | POA: Diagnosis not present

## 2021-03-13 DIAGNOSIS — R41841 Cognitive communication deficit: Secondary | ICD-10-CM | POA: Diagnosis not present

## 2021-03-15 ENCOUNTER — Encounter: Payer: Self-pay | Admitting: Nurse Practitioner

## 2021-03-15 DIAGNOSIS — R41841 Cognitive communication deficit: Secondary | ICD-10-CM | POA: Diagnosis not present

## 2021-03-15 DIAGNOSIS — R2689 Other abnormalities of gait and mobility: Secondary | ICD-10-CM | POA: Diagnosis not present

## 2021-03-15 DIAGNOSIS — S72011D Unspecified intracapsular fracture of right femur, subsequent encounter for closed fracture with routine healing: Secondary | ICD-10-CM | POA: Diagnosis not present

## 2021-03-15 DIAGNOSIS — F339 Major depressive disorder, recurrent, unspecified: Secondary | ICD-10-CM | POA: Diagnosis not present

## 2021-03-15 DIAGNOSIS — M6281 Muscle weakness (generalized): Secondary | ICD-10-CM | POA: Diagnosis not present

## 2021-03-15 DIAGNOSIS — M5459 Other low back pain: Secondary | ICD-10-CM | POA: Diagnosis not present

## 2021-03-15 NOTE — Assessment & Plan Note (Signed)
,   diet, LDL 117 01/06/21

## 2021-03-15 NOTE — Assessment & Plan Note (Signed)
stable, T11, L1 compression fx, takes Tylenol, Gabapentin 

## 2021-03-15 NOTE — Assessment & Plan Note (Signed)
Stable, takes Senokot S, MiraLax. 

## 2021-03-15 NOTE — Assessment & Plan Note (Signed)
Vit B12 level 499 05/21/20, takes Vit B12

## 2021-03-15 NOTE — Assessment & Plan Note (Signed)
takes Vit d daily, Vit D 56.33 02/25/21.

## 2021-03-15 NOTE — Assessment & Plan Note (Addendum)
Pending Evenity, 02/25/21 Dr. [name] recommended  Evenity inj monthly

## 2021-03-15 NOTE — Assessment & Plan Note (Signed)
weight is stable, didn't tolerate Mirtazapine. 

## 2021-03-15 NOTE — Assessment & Plan Note (Signed)
plt 158 12/27/20

## 2021-03-15 NOTE — Assessment & Plan Note (Signed)
hx of suicidal attempt/ideations, anxious at times,  on Prozac, Zyprexa. Mirtazapine made her AMS

## 2021-03-15 NOTE — Assessment & Plan Note (Signed)
Blood pressure is controlled, off Amlodipine. Bun/creat 19/0.74 02/25/21

## 2021-03-15 NOTE — Assessment & Plan Note (Signed)
Vit B12 499, TSH 2.29 02/25/21, SNF [facility] for supportive care. MMSE 28/30 08/26/20

## 2021-03-15 NOTE — Assessment & Plan Note (Signed)
Stable,  takes Zyrtec. 

## 2021-03-16 DIAGNOSIS — R41841 Cognitive communication deficit: Secondary | ICD-10-CM | POA: Diagnosis not present

## 2021-03-16 DIAGNOSIS — R2689 Other abnormalities of gait and mobility: Secondary | ICD-10-CM | POA: Diagnosis not present

## 2021-03-16 DIAGNOSIS — F339 Major depressive disorder, recurrent, unspecified: Secondary | ICD-10-CM | POA: Diagnosis not present

## 2021-03-16 DIAGNOSIS — S72011D Unspecified intracapsular fracture of right femur, subsequent encounter for closed fracture with routine healing: Secondary | ICD-10-CM | POA: Diagnosis not present

## 2021-03-16 DIAGNOSIS — M5459 Other low back pain: Secondary | ICD-10-CM | POA: Diagnosis not present

## 2021-03-16 DIAGNOSIS — M6281 Muscle weakness (generalized): Secondary | ICD-10-CM | POA: Diagnosis not present

## 2021-03-17 DIAGNOSIS — M5459 Other low back pain: Secondary | ICD-10-CM | POA: Diagnosis not present

## 2021-03-17 DIAGNOSIS — S72011D Unspecified intracapsular fracture of right femur, subsequent encounter for closed fracture with routine healing: Secondary | ICD-10-CM | POA: Diagnosis not present

## 2021-03-17 DIAGNOSIS — M6281 Muscle weakness (generalized): Secondary | ICD-10-CM | POA: Diagnosis not present

## 2021-03-17 DIAGNOSIS — F339 Major depressive disorder, recurrent, unspecified: Secondary | ICD-10-CM | POA: Diagnosis not present

## 2021-03-17 DIAGNOSIS — R41841 Cognitive communication deficit: Secondary | ICD-10-CM | POA: Diagnosis not present

## 2021-03-17 DIAGNOSIS — R2689 Other abnormalities of gait and mobility: Secondary | ICD-10-CM | POA: Diagnosis not present

## 2021-03-19 DIAGNOSIS — R2689 Other abnormalities of gait and mobility: Secondary | ICD-10-CM | POA: Diagnosis not present

## 2021-03-19 DIAGNOSIS — R41841 Cognitive communication deficit: Secondary | ICD-10-CM | POA: Diagnosis not present

## 2021-03-19 DIAGNOSIS — S72011D Unspecified intracapsular fracture of right femur, subsequent encounter for closed fracture with routine healing: Secondary | ICD-10-CM | POA: Diagnosis not present

## 2021-03-19 DIAGNOSIS — M5459 Other low back pain: Secondary | ICD-10-CM | POA: Diagnosis not present

## 2021-03-19 DIAGNOSIS — F339 Major depressive disorder, recurrent, unspecified: Secondary | ICD-10-CM | POA: Diagnosis not present

## 2021-03-19 DIAGNOSIS — M6281 Muscle weakness (generalized): Secondary | ICD-10-CM | POA: Diagnosis not present

## 2021-03-20 DIAGNOSIS — M6281 Muscle weakness (generalized): Secondary | ICD-10-CM | POA: Diagnosis not present

## 2021-03-20 DIAGNOSIS — R41841 Cognitive communication deficit: Secondary | ICD-10-CM | POA: Diagnosis not present

## 2021-03-20 DIAGNOSIS — S72011D Unspecified intracapsular fracture of right femur, subsequent encounter for closed fracture with routine healing: Secondary | ICD-10-CM | POA: Diagnosis not present

## 2021-03-20 DIAGNOSIS — R2689 Other abnormalities of gait and mobility: Secondary | ICD-10-CM | POA: Diagnosis not present

## 2021-03-20 DIAGNOSIS — M5459 Other low back pain: Secondary | ICD-10-CM | POA: Diagnosis not present

## 2021-03-20 DIAGNOSIS — F339 Major depressive disorder, recurrent, unspecified: Secondary | ICD-10-CM | POA: Diagnosis not present

## 2021-03-22 DIAGNOSIS — F339 Major depressive disorder, recurrent, unspecified: Secondary | ICD-10-CM | POA: Diagnosis not present

## 2021-03-22 DIAGNOSIS — R41841 Cognitive communication deficit: Secondary | ICD-10-CM | POA: Diagnosis not present

## 2021-03-22 DIAGNOSIS — R2689 Other abnormalities of gait and mobility: Secondary | ICD-10-CM | POA: Diagnosis not present

## 2021-03-22 DIAGNOSIS — M5459 Other low back pain: Secondary | ICD-10-CM | POA: Diagnosis not present

## 2021-03-22 DIAGNOSIS — S72011D Unspecified intracapsular fracture of right femur, subsequent encounter for closed fracture with routine healing: Secondary | ICD-10-CM | POA: Diagnosis not present

## 2021-03-22 DIAGNOSIS — Z20822 Contact with and (suspected) exposure to covid-19: Secondary | ICD-10-CM | POA: Diagnosis not present

## 2021-03-22 DIAGNOSIS — M6281 Muscle weakness (generalized): Secondary | ICD-10-CM | POA: Diagnosis not present

## 2021-03-24 DIAGNOSIS — S72011D Unspecified intracapsular fracture of right femur, subsequent encounter for closed fracture with routine healing: Secondary | ICD-10-CM | POA: Diagnosis not present

## 2021-03-24 DIAGNOSIS — M6281 Muscle weakness (generalized): Secondary | ICD-10-CM | POA: Diagnosis not present

## 2021-03-24 DIAGNOSIS — R41841 Cognitive communication deficit: Secondary | ICD-10-CM | POA: Diagnosis not present

## 2021-03-24 DIAGNOSIS — F339 Major depressive disorder, recurrent, unspecified: Secondary | ICD-10-CM | POA: Diagnosis not present

## 2021-03-24 DIAGNOSIS — M5459 Other low back pain: Secondary | ICD-10-CM | POA: Diagnosis not present

## 2021-03-24 DIAGNOSIS — R2689 Other abnormalities of gait and mobility: Secondary | ICD-10-CM | POA: Diagnosis not present

## 2021-03-26 DIAGNOSIS — M5459 Other low back pain: Secondary | ICD-10-CM | POA: Diagnosis not present

## 2021-03-26 DIAGNOSIS — R41841 Cognitive communication deficit: Secondary | ICD-10-CM | POA: Diagnosis not present

## 2021-03-26 DIAGNOSIS — S72011D Unspecified intracapsular fracture of right femur, subsequent encounter for closed fracture with routine healing: Secondary | ICD-10-CM | POA: Diagnosis not present

## 2021-03-26 DIAGNOSIS — M6281 Muscle weakness (generalized): Secondary | ICD-10-CM | POA: Diagnosis not present

## 2021-03-26 DIAGNOSIS — F339 Major depressive disorder, recurrent, unspecified: Secondary | ICD-10-CM | POA: Diagnosis not present

## 2021-03-26 DIAGNOSIS — R2689 Other abnormalities of gait and mobility: Secondary | ICD-10-CM | POA: Diagnosis not present

## 2021-03-29 DIAGNOSIS — R41841 Cognitive communication deficit: Secondary | ICD-10-CM | POA: Diagnosis not present

## 2021-03-29 DIAGNOSIS — M5459 Other low back pain: Secondary | ICD-10-CM | POA: Diagnosis not present

## 2021-03-29 DIAGNOSIS — R2689 Other abnormalities of gait and mobility: Secondary | ICD-10-CM | POA: Diagnosis not present

## 2021-03-29 DIAGNOSIS — F339 Major depressive disorder, recurrent, unspecified: Secondary | ICD-10-CM | POA: Diagnosis not present

## 2021-03-29 DIAGNOSIS — S72011D Unspecified intracapsular fracture of right femur, subsequent encounter for closed fracture with routine healing: Secondary | ICD-10-CM | POA: Diagnosis not present

## 2021-03-29 DIAGNOSIS — M6281 Muscle weakness (generalized): Secondary | ICD-10-CM | POA: Diagnosis not present

## 2021-03-30 DIAGNOSIS — S72011D Unspecified intracapsular fracture of right femur, subsequent encounter for closed fracture with routine healing: Secondary | ICD-10-CM | POA: Diagnosis not present

## 2021-03-30 DIAGNOSIS — R41841 Cognitive communication deficit: Secondary | ICD-10-CM | POA: Diagnosis not present

## 2021-03-30 DIAGNOSIS — F339 Major depressive disorder, recurrent, unspecified: Secondary | ICD-10-CM | POA: Diagnosis not present

## 2021-03-30 DIAGNOSIS — M5459 Other low back pain: Secondary | ICD-10-CM | POA: Diagnosis not present

## 2021-03-30 DIAGNOSIS — R2689 Other abnormalities of gait and mobility: Secondary | ICD-10-CM | POA: Diagnosis not present

## 2021-03-30 DIAGNOSIS — M6281 Muscle weakness (generalized): Secondary | ICD-10-CM | POA: Diagnosis not present

## 2021-03-31 DIAGNOSIS — S72011D Unspecified intracapsular fracture of right femur, subsequent encounter for closed fracture with routine healing: Secondary | ICD-10-CM | POA: Diagnosis not present

## 2021-03-31 DIAGNOSIS — M5459 Other low back pain: Secondary | ICD-10-CM | POA: Diagnosis not present

## 2021-03-31 DIAGNOSIS — R41841 Cognitive communication deficit: Secondary | ICD-10-CM | POA: Diagnosis not present

## 2021-03-31 DIAGNOSIS — F339 Major depressive disorder, recurrent, unspecified: Secondary | ICD-10-CM | POA: Diagnosis not present

## 2021-03-31 DIAGNOSIS — R2689 Other abnormalities of gait and mobility: Secondary | ICD-10-CM | POA: Diagnosis not present

## 2021-03-31 DIAGNOSIS — M6281 Muscle weakness (generalized): Secondary | ICD-10-CM | POA: Diagnosis not present

## 2021-04-01 ENCOUNTER — Encounter: Payer: Self-pay | Admitting: Nurse Practitioner

## 2021-04-01 ENCOUNTER — Non-Acute Institutional Stay (INDEPENDENT_AMBULATORY_CARE_PROVIDER_SITE_OTHER): Payer: Medicare Other | Admitting: Nurse Practitioner

## 2021-04-01 DIAGNOSIS — Z0000 Encounter for general adult medical examination without abnormal findings: Secondary | ICD-10-CM

## 2021-04-01 DIAGNOSIS — G309 Alzheimer's disease, unspecified: Secondary | ICD-10-CM | POA: Diagnosis not present

## 2021-04-01 DIAGNOSIS — F0280 Dementia in other diseases classified elsewhere without behavioral disturbance: Secondary | ICD-10-CM | POA: Diagnosis not present

## 2021-04-01 NOTE — Progress Notes (Signed)
Subjective:   Tanya Walker is a 81 y.o. female who presents for Medicare Annual (Subsequent) preventive examination at SNF [facility]       Objective:    Today's Vitals   04/01/21 1020  BP: 132/88  Pulse: 74  Resp: 20  Temp: 98 F (36.7 C)  SpO2: 95%  Weight: 86 lb 4.8 oz (39.1 kg)  Height: 5\' 4"  (1.626 m)   Body mass index is 14.81 kg/m.  Advanced Directives 04/01/2021 03/12/2021 02/12/2021 01/14/2021 11/24/2020 11/20/2020 11/05/2020  Does Patient Have a Medical Advance Directive? Yes No No No No No No  Type of Paramedic of [place];Living will - - - - - -  Does patient want to make changes to medical advance directive? No - Patient declined No - Patient declined - No - Patient declined No - Patient declined - No - Patient declined  Copy of [place] in Chart? Yes - validated most recent copy scanned in chart (See row information) - - - - - -  Would patient like information on creating a medical advance directive? - - - - - - -  Some encounter information is confidential and restricted. Go to Review Flowsheets activity to see all data.    Current Medications (verified) Outpatient Encounter Medications as of 04/01/2021  Medication Sig   acetaminophen (TYLENOL) 500 MG tablet Take 1,000 mg by mouth every 6 (six) hours as needed for moderate pain, mild pain, headache or fever.   ASPIRIN 81 PO Take 1 tablet by mouth daily.   Calcium Carbonate (CALCIUM-CARB 600 PO) Take 1 tablet by mouth 2 (two) times daily.   cetirizine (ZYRTEC ALLERGY) 10 MG tablet Take 1 tablet (10 mg total) by mouth daily.   Cholecalciferol (VITAMIN D3) 2000 units capsule Take 1 capsule (2,000 Units total) by mouth daily.   Cobalamin Combinations (VITAMIN B12-FOLIC ACID PO) Place 1 lozenge under the tongue in the morning.   docusate sodium (COLACE) 100 MG capsule Take 100 mg by mouth at bedtime.   FLUoxetine (PROZAC) 20 MG capsule Take 1 capsule (20 mg total) by mouth at  bedtime.   gabapentin (NEURONTIN) 100 MG capsule Take 1 capsule (100 mg total) by mouth 3 (three) times daily.   KETOCONAZOLE, TOPICAL, (NIZORAL A-D) 1 % SHAM Apply 1 application topically once a week. Wednesday   melatonin 5 MG TABS Take 5 mg by mouth at bedtime.   Multiple Vitamins-Minerals (PRESERVISION AREDS 2 PO) Take 1 capsule by mouth 2 (two) times daily.   OLANZapine (ZYPREXA) 5 MG tablet Take 1 tablet (5 mg total) by mouth at bedtime.   polyethylene glycol (MIRALAX / GLYCOLAX) 17 g packet Take 17 g by mouth daily as needed for moderate constipation or mild constipation. (Patient taking differently: Take 17 g by mouth daily.)   Romosozumab-aqqg (EVENITY) 105 MG/1.17ML SOSY injection Inject 210 mg into the skin once.   SALONPAS 3.02-13-08 % PTCH Apply 1 patch topically See admin instructions. Apply 1 patch to the lower back and remove (at most) after 8 hours- once a day as needed for pain   senna-docusate (SENOKOT-S) 8.6-50 MG tablet Take 2 tablets by mouth daily.   sodium phosphate (FLEET) 7-19 GM/118ML ENEM Place 1 enema rectally daily as needed for severe constipation or mild constipation.   No facility-administered encounter medications on file as of 04/01/2021.    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   Depression    years ago   Headache    sinus headaches   Lipoma of arm    Right   Medical history non-contributory    Osteoporosis    SVD (spontaneous vaginal delivery)    x 2   Varicose vein    Vitamin D deficiency    Past Surgical History:  Procedure Laterality Date   ANTERIOR APPROACH HEMI HIP ARTHROPLASTY Right 09/04/2020   Procedure: ANTERIOR APPROACH HEMI HIP ARTHROPLASTY;  Surgeon: Rod Can, MD;  Location: [facility];  Service: Orthopedics;  Laterality: Right;   [place]   COLONOSCOPY     GANGLION CYST EXCISION     x2 left and right arm   HYSTEROSCOPY WITH D & C N/A 07/15/2013   Procedure: DILATATION AND CURETTAGE /HYSTEROSCOPY;  Surgeon: Lovenia Kim, MD;  Location: [facility];  Service: Gynecology;  Laterality: N/A;   KYPHOPLASTY Bilateral 05/04/2020   Procedure: KYPHOPLASTY THORACIC TWELVE;  Surgeon: Vallarie Mare, MD;  Location: [facility];  Service: Neurosurgery;  Laterality: Bilateral;   MANDIBLE SURGERY  1954   WISDOM TOOTH EXTRACTION     Family History  Problem Relation Age of Onset   Hypertension Mother    Heart disease Father    Cancer Father    Social History   Socioeconomic History   Marital status: Married    Spouse name: Not on file   Number of children: 1   Years of education: Not on file   Highest education level: Not on file  Occupational History   Occupation: retired  Tobacco Use   Smoking status: Never   Smokeless tobacco: Never  Vaping Use   Vaping Use: Never used  Substance and Sexual Activity   Alcohol use: No   Drug use: No   Sexual activity: Yes    Birth control/protection: Post-menopausal  Other Topics Concern   Not on file  Social History Narrative   GI in [place] for colonoscopy   Dr Ronita Hipps - GYN      Denies Surgical history      Family history of varicose veins   Mother is 18      Regular exercise - NO   Social Determinants of Health   Financial Resource Strain: Not on file  Food Insecurity: Not on file  Transportation Needs: Not on file  Physical Activity: Not on file  Stress: Not on file  Social Connections: Not on file    Tobacco Counseling Counseling given: Not Answered   Clinical Intake:  Pre-visit preparation completed: Yes  Pain : No/denies pain     BMI - recorded: 14.81 Nutritional Risks: None Diabetes: No  How often do you need to have someone help you when you read instructions, pamphlets, or other written materials from your doctor or pharmacy?: 2 -  Rarely What is the last grade level you completed in school?: college  Diabetic?no  Interpreter Needed?: No  Information entered by :: [place] NP   Activities of Daily Living In your present state of health, do you have any difficulty performing the following activities: 09/03/2020 09/03/2020  Hearing? - N  Vision? - N  Difficulty concentrating or making decisions? - Y  Walking or climbing stairs? - Y  Dressing or bathing? - Y  Doing errands, shopping? N -  Some encounter information is confidential and  restricted. Go to Review Flowsheets activity to see all data.  Some recent data might be hidden    Patient Care Team: Plotnikov, [name], MD as PCP - General (Internal Medicine)  Indicate any recent Medical Services you may have received from other than [facility] providers in the past year (date may be approximate).     Assessment:   This is a routine wellness examination for Diksha.  Hearing/Vision screen No results found.  Dietary issues and exercise activities discussed:     Goals Addressed             This Visit's Progress    Response to Treatment Maximized       Evidence-based guidance:  Engage patient in conversation about the perceived benefits of mental health treatment and quality of therapeutic alliance with his/her mental health professional.  Assess for barriers to attending appointments, such as transportation, financial, sense of slow or little improvement and forgetfulness.  Consider patient resistance to treatment based on stigma related to mental health diagnosis.  Maintain a documented system of ongoing contacts with patient during the first 6 to 12 months of treatment, as missed appointments and disengagement may signal deteriorating condition.  Provide anticipatory guidance about the risk of increased symptoms and potential psychiatric hospitalization for those who have a pattern of nonattendance at mental health appointments.  Re-screen for  depressive symptoms at mutually identified intervals.   Notes:        Depression Screen PHQ 2/9 Scores 10/10/2019 10/08/2018 10/02/2017 09/29/2016 07/17/2014  PHQ - 2 Score 1 1 1  0 0  PHQ- 9 Score - 2 2 1  -    Fall Risk Fall Risk  10/10/2019 10/08/2018 10/02/2017 09/29/2016 10/08/2015  Falls in the past year? 0 0 No No No  Comment - - - - Emmi Telephone Survey: data to providers prior to load  Number falls in past yr: 0 0 - - -  Injury with Fall? 0 0 - - -  Risk for fall due to : No Fall Risks - - - -  Follow up Falls evaluation completed - - - -    FALL RISK PREVENTION PERTAINING TO THE HOME:  Any stairs in or around the home? Yes  If so, are there any without handrails? No  Home free of loose throw rugs in walkways, pet beds, electrical cords, etc? Yes  Adequate lighting in your home to reduce risk of falls? Yes   ASSISTIVE DEVICES UTILIZED TO PREVENT FALLS:  Life alert? No  Use of a cane, walker or w/c? Yes  Grab bars in the bathroom? Yes  Shower chair or bench in shower? Yes  Elevated toilet seat or a handicapped toilet? Yes   TIMED UP AND GO:  Was the test performed? No .   Gait steady and fast with assistive device  Cognitive Function: MMSE - Mini Mental State Exam 10/02/2017  Not completed: Refused     6CIT Screen 10/10/2019 10/08/2018  What Year? 0 points 0 points  What month? 0 points 0 points  What time? 3 points 0 points  Count back from 20 0 points 0 points  Months in reverse 0 points 0 points  Repeat phrase 0 points 0 points  Total Score 3 0    Immunizations Immunization History  Administered Date(s) Administered   DT (Pediatric) 04/10/2002   Fluad Quad(high Dose 65+) 10/28/2019   Influenza Split 03/08/2011, 11/17/2011   Influenza Whole 01/09/2007, 11/05/2008, 11/04/2009   Influenza, High Dose Seasonal PF 12/07/2015, 09/29/2016,  11/09/2017, 09/27/2018, 10/28/2019   Influenza-Unspecified 10/30/2012, 11/29/2013, 12/02/2014, 11/25/2020   Moderna Sars-Covid-2  Vaccination 02/08/2019, 03/11/2019   PFIZER(Purple Top)SARS-COV-2 Vaccination 10/09/2019   PPD Test 12/22/2015, 01/05/2016   Pneumococcal Conjugate-13 07/17/2014   Pneumococcal Polysaccharide-23 08/15/2005, 09/25/2015   Tetanus 12/24/2012   Unspecified SARS-COV-2 Vaccination 02/11/2019, 12/17/2019, 10/27/2020   Zoster Recombinat (Shingrix) 06/07/2019, 09/06/2019   Zoster, Live 12/05/2013    TDAP status: Up to date  Flu Vaccine status: Up to date  Pneumococcal vaccine status: Up to date  Covid-19 vaccine status: Completed vaccines  Qualifies for Shingles Vaccine? Yes   Zostavax completed Yes   Shingrix Completed?: Yes  Screening Tests Health Maintenance  Topic Date Due   TETANUS/TDAP  12/25/2022   Pneumonia Vaccine 27+ Years old  Completed   INFLUENZA VACCINE  Completed   DEXA SCAN  Completed   COVID-19 Vaccine  Completed   Zoster Vaccines- Shingrix  Completed   HPV VACCINES  Aged Out    Health Maintenance  There are no preventive care reminders to display for this patient.  Colorectal cancer screening: No longer required.   Mammogram status: No longer required due to aged out.  Bone Density status: Ordered DEXA. Pt provided with contact info and advised to call to schedule appt.  Lung Cancer Screening: (Low Dose CT Chest recommended if Age 21-80 years, 30 pack-year currently smoking OR have quit w/in 15years.) does not qualify.    Additional Screening:  Hepatitis C Screening: does not qualify  Vision Screening: Recommended annual ophthalmology exams for early detection of glaucoma and other disorders of the eye. Is the patient up to date with their annual eye exam?  No  Who is the provider or what is the name of the office in which the patient attends annual eye exams? Will refer If pt is not established with a provider, would they like to be referred to a provider to establish care? No .   Dental Screening: Recommended annual dental exams for proper oral  hygiene  Community Resource Referral / Chronic Care Management: CRR required this visit?  No   CCM required this visit?  No      Plan:    DEXA MMSE Eye Exam recommended.  I have personally reviewed and noted the following in the patients chart:   Medical and social history Use of alcohol, tobacco or illicit drugs  Current medications and supplements including opioid prescriptions.  Functional ability and status Nutritional status Physical activity Advanced directives List of other physicians Hospitalizations, surgeries, and ER visits in previous 12 months Vitals Screenings to include cognitive, depression, and falls Referrals and appointments  In addition, I have reviewed and discussed with patient certain preventive protocols, quality metrics, and best practice recommendations. A written personalized care plan for preventive services as well as general preventive health recommendations were provided to patient.     Starr Engel X Latoy Labriola, NP   04/01/2021

## 2021-04-02 DIAGNOSIS — S72011D Unspecified intracapsular fracture of right femur, subsequent encounter for closed fracture with routine healing: Secondary | ICD-10-CM | POA: Diagnosis not present

## 2021-04-02 DIAGNOSIS — R41841 Cognitive communication deficit: Secondary | ICD-10-CM | POA: Diagnosis not present

## 2021-04-02 DIAGNOSIS — F339 Major depressive disorder, recurrent, unspecified: Secondary | ICD-10-CM | POA: Diagnosis not present

## 2021-04-02 DIAGNOSIS — M6281 Muscle weakness (generalized): Secondary | ICD-10-CM | POA: Diagnosis not present

## 2021-04-02 DIAGNOSIS — M5459 Other low back pain: Secondary | ICD-10-CM | POA: Diagnosis not present

## 2021-04-02 DIAGNOSIS — R2689 Other abnormalities of gait and mobility: Secondary | ICD-10-CM | POA: Diagnosis not present

## 2021-04-06 DIAGNOSIS — M5459 Other low back pain: Secondary | ICD-10-CM | POA: Diagnosis not present

## 2021-04-06 DIAGNOSIS — F339 Major depressive disorder, recurrent, unspecified: Secondary | ICD-10-CM | POA: Diagnosis not present

## 2021-04-06 DIAGNOSIS — R41841 Cognitive communication deficit: Secondary | ICD-10-CM | POA: Diagnosis not present

## 2021-04-06 DIAGNOSIS — S72011D Unspecified intracapsular fracture of right femur, subsequent encounter for closed fracture with routine healing: Secondary | ICD-10-CM | POA: Diagnosis not present

## 2021-04-06 DIAGNOSIS — M6281 Muscle weakness (generalized): Secondary | ICD-10-CM | POA: Diagnosis not present

## 2021-04-06 DIAGNOSIS — R2689 Other abnormalities of gait and mobility: Secondary | ICD-10-CM | POA: Diagnosis not present

## 2021-04-07 DIAGNOSIS — R41841 Cognitive communication deficit: Secondary | ICD-10-CM | POA: Diagnosis not present

## 2021-04-07 DIAGNOSIS — F339 Major depressive disorder, recurrent, unspecified: Secondary | ICD-10-CM | POA: Diagnosis not present

## 2021-04-13 ENCOUNTER — Encounter: Payer: Self-pay | Admitting: Internal Medicine

## 2021-04-13 ENCOUNTER — Non-Acute Institutional Stay (SKILLED_NURSING_FACILITY): Payer: Medicare Other | Admitting: Internal Medicine

## 2021-04-13 DIAGNOSIS — E559 Vitamin D deficiency, unspecified: Secondary | ICD-10-CM | POA: Diagnosis not present

## 2021-04-13 DIAGNOSIS — E785 Hyperlipidemia, unspecified: Secondary | ICD-10-CM | POA: Diagnosis not present

## 2021-04-13 DIAGNOSIS — K5901 Slow transit constipation: Secondary | ICD-10-CM

## 2021-04-13 DIAGNOSIS — M810 Age-related osteoporosis without current pathological fracture: Secondary | ICD-10-CM | POA: Diagnosis not present

## 2021-04-13 DIAGNOSIS — F32A Depression, unspecified: Secondary | ICD-10-CM | POA: Diagnosis not present

## 2021-04-13 DIAGNOSIS — F0390 Unspecified dementia without behavioral disturbance: Secondary | ICD-10-CM

## 2021-04-13 DIAGNOSIS — R41841 Cognitive communication deficit: Secondary | ICD-10-CM | POA: Diagnosis not present

## 2021-04-13 DIAGNOSIS — F339 Major depressive disorder, recurrent, unspecified: Secondary | ICD-10-CM | POA: Diagnosis not present

## 2021-04-13 NOTE — Progress Notes (Signed)
Location:   [facility] Room Number: 30 Place of Service:  SNF [phone]) Provider:  Veleta Miners MD   Plotnikov, [name], MD  Patient Care Team: Cassandria Anger, MD as PCP - General (Internal Medicine)  Extended Emergency Contact Information Primary Emergency Contact: Burman Blacksmith Address: [address]          [place], [place] [address] [place] Phone: [phone] Relation: Spouse Secondary Emergency Contact: [facility] Phone: [phone] Relation: Daughter  Code Status:  Full Code Goals of care: Advanced Directive information Advanced Directives 04/13/2021  Does Patient Have a Medical Advance Directive? Yes  Type of Paramedic of [place];Living will  Does patient want to make changes to medical advance directive? No - Patient declined  Copy of [place] in Chart? Yes - validated most recent copy scanned in chart (See row information)  Would patient like information on creating a medical advance directive? -  Some encounter information is confidential and restricted. Go to Review Flowsheets activity to see all data.     Chief Complaint  Patient presents with   Medical Management of Chronic Issues    HPI:  Pt is a 81 y.o. female seen today for medical management of chronic diseases.    Patient has h/o Osteoporosis with Multiple Compression Fracture on Evenity per Dr [name] Was also admitted in the hospital from 7/12-7/14 for Change in Mental status most likely due to Remeron her CT scan showed moderate atrophy with chronic microvascular ischemia Then had fall in the room and sustained Right Hip Fracture.admitted in the hospital from 7/28-8/1 for closed right hip fracture S/p right hip hemiarthroplasty Admitted in the hospital from 12/30/20-01/13/21 For Suicide attempt and Acute depression   She is stable. No new Nursing issues. 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   Depression    years ago   Headache    sinus headaches   Lipoma of arm    Right   Medical history non-contributory    Osteoporosis    SVD (spontaneous vaginal delivery)    x 2   Varicose vein    Vitamin D deficiency    Past Surgical History:  Procedure Laterality Date   ANTERIOR APPROACH HEMI HIP ARTHROPLASTY Right 09/04/2020   Procedure: ANTERIOR APPROACH HEMI HIP ARTHROPLASTY;  Surgeon: Rod Can, MD;  Location: [facility];  Service: Orthopedics;  Laterality: Right;   [place]   COLONOSCOPY     GANGLION CYST EXCISION     x2 left and right arm   HYSTEROSCOPY WITH D & C N/A 07/15/2013   Procedure: DILATATION AND CURETTAGE /HYSTEROSCOPY;  Surgeon: Lovenia Kim, MD;  Location: [facility];  Service: Gynecology;  Laterality: N/A;   KYPHOPLASTY Bilateral 05/04/2020   Procedure: KYPHOPLASTY THORACIC TWELVE;  Surgeon: Vallarie Mare, MD;  Location: [facility];  Service: Neurosurgery;  Laterality: Bilateral;  MANDIBLE SURGERY  1954   WISDOM TOOTH EXTRACTION      Allergies  Allergen Reactions   Alendronate Sodium Other (See Comments)    Leg cramps and "Allergic," per MAR   Risedronate Sodium Other (See Comments)    Caused the patient to be achy and is "Allergic," per MAR   Tramadol Other (See Comments)    Caused the patient to feel badly    Keflex [Cephalexin] Other (See Comments)    Possible rash    Allergies as of 04/13/2021       Reactions   Alendronate Sodium Other (See Comments)   Leg cramps and  "Allergic," per MAR   Risedronate Sodium Other (See Comments)   Caused the patient to be achy and is "Allergic," per MAR   Tramadol Other (See Comments)   Caused the patient to feel badly    Keflex [cephalexin] Other (See Comments)   Possible rash        Medication List        Accurate as of April 13, 2021 11:09 AM. If you have any questions, ask your nurse or doctor.          acetaminophen 500 MG tablet Commonly known as: TYLENOL Take 1,000 mg by mouth every 6 (six) hours as needed for moderate pain, mild pain, headache or fever.   ASPIRIN 81 PO Take 1 tablet by mouth daily.   CALCIUM-CARB 600 PO Take 1 tablet by mouth 2 (two) times daily.   cetirizine 10 MG tablet Commonly known as: ZyrTEC Allergy Take 1 tablet (10 mg total) by mouth daily.   docusate sodium 100 MG capsule Commonly known as: COLACE Take 100 mg by mouth at bedtime.   Evenity 105 MG/1.17ML Sosy injection Generic drug: Romosozumab-aqqg Inject 210 mg into the skin once.   FLUoxetine 20 MG capsule Commonly known as: PROZAC Take 1 capsule (20 mg total) by mouth at bedtime.   gabapentin 100 MG capsule Commonly known as: NEURONTIN Take 1 capsule (100 mg total) by mouth 3 (three) times daily.   melatonin 5 MG Tabs Take 5 mg by mouth at bedtime.   Nizoral A-D 1 % Sham Generic drug: KETOCONAZOLE (TOPICAL) Apply 1 application topically once a week. Wednesday   OLANZapine 5 MG tablet Commonly known as: ZYPREXA Take 1 tablet (5 mg total) by mouth at bedtime.   polyethylene glycol 17 g packet Commonly known as: MIRALAX / GLYCOLAX Take 17 g by mouth daily as needed for moderate constipation or mild constipation. What changed: when to take this   PRESERVISION AREDS 2 PO Take 1 capsule by mouth 2 (two) times daily.   Salonpas 3.02-13-08 % Ptch Generic drug: Camphor-Menthol-Methyl Sal Apply 1 patch topically See admin instructions. Apply 1 patch to the lower back and remove (at most) after 8 hours-  once a day as needed for pain   senna-docusate 8.6-50 MG tablet Commonly known as: Senokot-S Take 2 tablets by mouth daily.   sodium phosphate 7-19 GM/118ML Enem Place 1 enema rectally daily as needed for severe constipation or mild constipation.   VITAMIN B12-FOLIC ACID PO Place 1 lozenge under the tongue in the morning.   Vitamin D3 50 MCG (2000 UT) capsule Take 1 capsule (2,000 Units total) by mouth daily.        Review of Systems  Constitutional:  Negative for activity change and appetite change.  HENT: Negative.    Respiratory:  Negative for cough and shortness of breath.   Cardiovascular:  Negative for  leg swelling.  Gastrointestinal:  Positive for constipation.  Genitourinary: Negative.   Musculoskeletal:  Negative for arthralgias, gait problem and myalgias.  Skin: Negative.   Neurological:  Negative for dizziness and weakness.  Psychiatric/Behavioral:  Positive for confusion, dysphoric mood and sleep disturbance.    Immunization History  Administered Date(s) Administered   DT (Pediatric) 04/10/2002   Fluad Quad(high Dose 65+) 10/28/2019   Influenza Split 03/08/2011, 11/17/2011   Influenza Whole 01/09/2007, 11/05/2008, 11/04/2009   Influenza, High Dose Seasonal PF 12/07/2015, 09/29/2016, 11/09/2017, 09/27/2018, 10/28/2019   Influenza-Unspecified 10/30/2012, 11/29/2013, 12/02/2014, 11/25/2020   Moderna Sars-Covid-2 Vaccination 02/08/2019, 03/11/2019   PFIZER(Purple Top)SARS-COV-2 Vaccination 10/09/2019   PPD Test 12/22/2015, 01/05/2016   Pneumococcal Conjugate-13 07/17/2014   Pneumococcal Polysaccharide-23 08/15/2005, 09/25/2015   Tetanus 12/24/2012   Unspecified SARS-COV-2 Vaccination 02/11/2019, 12/17/2019, 10/27/2020   Zoster Recombinat (Shingrix) 06/07/2019, 09/06/2019   Zoster, Live 12/05/2013   Pertinent  Health Maintenance Due  Topic Date Due   INFLUENZA VACCINE  Completed   DEXA SCAN  Completed   Fall Risk 12/27/2020 12/28/2020 12/28/2020 12/29/2020  12/30/2020  Falls in the past year? - - - - -  Was there an injury with Fall? - - - - -  Fall Risk Category Calculator - - - - -  Fall Risk Category - - - - -  Patient Fall Risk Level Moderate fall risk Moderate fall risk High fall risk High fall risk High fall risk  Patient at Risk for Falls Due to - - - - -  Fall risk Follow up - - - - -  Some encounter information is confidential and restricted. Go to Review Flowsheets activity to see all data.   Functional Status Survey:    Vitals:   04/13/21 1056  BP: 119/65  Pulse: 72  Resp: 18  Temp: 98.1 F (36.7 C)  SpO2: 99%  Weight: 86 lb 8 oz (39.2 kg)  Height: '5\' 4"'$  (1.626 m)   Body mass index is 14.85 kg/m. Physical Exam Vitals reviewed.  Constitutional:      Appearance: Normal appearance.  HENT:     Head: Normocephalic.     Nose: Nose normal.     Mouth/Throat:     Mouth: Mucous membranes are moist.     Pharynx: Oropharynx is clear.  Eyes:     Pupils: Pupils are equal, round, and reactive to light.  Cardiovascular:     Rate and Rhythm: Normal rate and regular rhythm.     Pulses: Normal pulses.     Heart sounds: Normal heart sounds. No murmur heard. Pulmonary:     Effort: Pulmonary effort is normal.     Breath sounds: Normal breath sounds.  Abdominal:     General: Abdomen is flat. Bowel sounds are normal.     Palpations: Abdomen is soft.  Musculoskeletal:        General: No swelling.     Cervical back: Neck supple.  Skin:    General: Skin is warm.  Neurological:     General: No focal deficit present.     Mental Status: She is alert and oriented to person, place, and time.  Psychiatric:        Mood and Affect: Mood normal.        Thought Content: Thought content normal.    Labs reviewed: Recent Labs    08/18/20 2330 08/19/20 0429 09/03/20 0716 09/06/20 0533 09/18/20 0000 11/11/20 0000 12/27/20 1410 02/25/21 1533  NA  --  137   < > 135   < >  139 140 139  K  --  3.6   < > 3.5   < > 4.1 4.1 4.5  CL   --  104   < > 102   < > 104 105 101  CO2  --  24   < > 27   < > 28* 31 31  GLUCOSE  --  80   < > 95  --   --  102* 89  BUN  --  10   < > 18   < > 18 29* 19  CREATININE  --  <0.30*   < > 0.40*   < > 0.6 0.52 0.74  CALCIUM  --  8.6*   < > 8.7*   < > 8.6* 8.8* 9.9  MG 2.2 2.1  --   --   --   --   --   --   PHOS  --  3.1  --   --   --   --   --   --    < > = values in this interval not displayed.   Recent Labs    08/19/20 0429 09/18/20 0000 09/22/20 0000 11/11/20 0000 12/27/20 1410 02/25/21 1533  AST 22   < > '26 14 17 21  '$ ALT 18   < > '28 9 15 13  '$ ALKPHOS 86   < > 112 57 53 61  BILITOT 1.0  --   --   --  0.4 0.4  PROT 6.4*  --   --   --  6.0* 7.3  ALBUMIN 3.9   < > 3.0*  --  3.3* 4.1   < > = values in this interval not displayed.   Recent Labs    09/06/20 0533 09/07/20 0400 09/18/20 0000 09/22/20 0000 11/11/20 0000 12/27/20 1410  WBC 9.6 8.1   < > 5.7 4.1 4.3  NEUTROABS  --   --    < > 4,252.00 2,362.00 2.3  HGB 11.1* 10.1*   < > 11.3* 10.6* 12.0  HCT 34.1* 30.1*   < > 35* 33* 36.8  MCV 93.2 92.0  --   --   --  95.8  PLT 121* 136*  --  342 136* 158   < > = values in this interval not displayed.   Lab Results  Component Value Date   TSH 2.29 02/25/2021   Lab Results  Component Value Date   HGBA1C 5.5 01/06/2021   Lab Results  Component Value Date   CHOL 182 01/06/2021   HDL 58 01/06/2021   LDLCALC 117 (H) 01/06/2021   LDLDIRECT 135.4 12/26/2008   TRIG 37 01/06/2021   CHOLHDL 3.1 01/06/2021    Significant Diagnostic Results in last 30 days:  No results found.  Assessment/Plan 1. Age-related osteoporosis without current pathological fracture On Evenity now in the facility Vit D level was good  2. Acute depression Doing well with Prozac Also on Zyprexa Nurses noticed some withdrawal and Insomnia Change Zyprexa to 7.5 mg Have not tolerated Remeron before  3. Progressive dementia with uncertain etiology ([place]) Need to stay in SNF as she continues to  need help with her ADLS due to her Cognition  4. Vitamin D deficiency On Supplement  5. Chronic Back pain Controlled on Neurontin   6. Slow transit constipation Mirlax    Family/ staff Communication:   Labs/tests ordered:

## 2021-04-14 DIAGNOSIS — R41841 Cognitive communication deficit: Secondary | ICD-10-CM | POA: Diagnosis not present

## 2021-04-14 DIAGNOSIS — F339 Major depressive disorder, recurrent, unspecified: Secondary | ICD-10-CM | POA: Diagnosis not present

## 2021-04-19 DIAGNOSIS — F339 Major depressive disorder, recurrent, unspecified: Secondary | ICD-10-CM | POA: Diagnosis not present

## 2021-04-19 DIAGNOSIS — R41841 Cognitive communication deficit: Secondary | ICD-10-CM | POA: Diagnosis not present

## 2021-04-21 DIAGNOSIS — R41841 Cognitive communication deficit: Secondary | ICD-10-CM | POA: Diagnosis not present

## 2021-04-21 DIAGNOSIS — F339 Major depressive disorder, recurrent, unspecified: Secondary | ICD-10-CM | POA: Diagnosis not present

## 2021-04-26 ENCOUNTER — Telehealth: Payer: Self-pay

## 2021-04-26 NOTE — Telephone Encounter (Signed)
Tanya Walker with [facility] retrieval called and stated that some paperwork was filled out for patient but the patients name was not listed on the paperwork. She reports it was for incompetent orders for the patient and they need the orders to be refax with all information filled out. I advised her a message will be send to the Dr. Veleta Miners and her medical assistant. ?

## 2021-04-27 DIAGNOSIS — F339 Major depressive disorder, recurrent, unspecified: Secondary | ICD-10-CM | POA: Diagnosis not present

## 2021-04-27 DIAGNOSIS — R41841 Cognitive communication deficit: Secondary | ICD-10-CM | POA: Diagnosis not present

## 2021-04-27 DIAGNOSIS — Z20822 Contact with and (suspected) exposure to covid-19: Secondary | ICD-10-CM | POA: Diagnosis not present

## 2021-04-27 NOTE — Telephone Encounter (Addendum)
Do you have a phone number for [employer]? Or copies of form? ?I will give them to Dr. Lyndel Safe to sign tomorrow.  ?

## 2021-04-28 DIAGNOSIS — R41841 Cognitive communication deficit: Secondary | ICD-10-CM | POA: Diagnosis not present

## 2021-04-28 DIAGNOSIS — F339 Major depressive disorder, recurrent, unspecified: Secondary | ICD-10-CM | POA: Diagnosis not present

## 2021-04-30 DIAGNOSIS — Z20822 Contact with and (suspected) exposure to covid-19: Secondary | ICD-10-CM | POA: Diagnosis not present

## 2021-04-30 NOTE — Telephone Encounter (Signed)
Forms re signed by Dr. Lyndel Safe, sent for fax/scanning. ?

## 2021-05-04 DIAGNOSIS — F339 Major depressive disorder, recurrent, unspecified: Secondary | ICD-10-CM | POA: Diagnosis not present

## 2021-05-04 DIAGNOSIS — R41841 Cognitive communication deficit: Secondary | ICD-10-CM | POA: Diagnosis not present

## 2021-05-04 DIAGNOSIS — L814 Other melanin hyperpigmentation: Secondary | ICD-10-CM | POA: Diagnosis not present

## 2021-05-04 DIAGNOSIS — L570 Actinic keratosis: Secondary | ICD-10-CM | POA: Diagnosis not present

## 2021-05-06 ENCOUNTER — Telehealth: Payer: Self-pay

## 2021-05-06 NOTE — Telephone Encounter (Addendum)
Pt husband is requesting a referral to [facility] to determine if pt has had a stroke. Pt husband wants to know where should they put the most focus in rehabbing. ? ?Please advise ?

## 2021-05-07 NOTE — Telephone Encounter (Signed)
Called pts husband to inform him of Dr. Judeen Hammans statement. Pts husband id not answer was not able to LVM due to his mailbox being full. ?

## 2021-05-07 NOTE — Telephone Encounter (Signed)
Tanya Walker is suffering from Alzheimer disease. ?Her previous brain CT showed stroke. ?Thanks, ?

## 2021-05-10 ENCOUNTER — Encounter: Payer: Self-pay | Admitting: Nurse Practitioner

## 2021-05-10 ENCOUNTER — Non-Acute Institutional Stay (SKILLED_NURSING_FACILITY): Payer: Medicare Other | Admitting: Nurse Practitioner

## 2021-05-10 DIAGNOSIS — F32A Depression, unspecified: Secondary | ICD-10-CM | POA: Diagnosis not present

## 2021-05-10 DIAGNOSIS — M810 Age-related osteoporosis without current pathological fracture: Secondary | ICD-10-CM

## 2021-05-10 DIAGNOSIS — G309 Alzheimer's disease, unspecified: Secondary | ICD-10-CM | POA: Diagnosis not present

## 2021-05-10 DIAGNOSIS — I10 Essential (primary) hypertension: Secondary | ICD-10-CM | POA: Diagnosis not present

## 2021-05-10 DIAGNOSIS — K5901 Slow transit constipation: Secondary | ICD-10-CM | POA: Diagnosis not present

## 2021-05-10 DIAGNOSIS — E538 Deficiency of other specified B group vitamins: Secondary | ICD-10-CM

## 2021-05-10 DIAGNOSIS — M545 Low back pain, unspecified: Secondary | ICD-10-CM | POA: Diagnosis not present

## 2021-05-10 DIAGNOSIS — F0280 Dementia in other diseases classified elsewhere without behavioral disturbance: Secondary | ICD-10-CM

## 2021-05-10 DIAGNOSIS — D500 Iron deficiency anemia secondary to blood loss (chronic): Secondary | ICD-10-CM

## 2021-05-10 DIAGNOSIS — E559 Vitamin D deficiency, unspecified: Secondary | ICD-10-CM | POA: Diagnosis not present

## 2021-05-10 DIAGNOSIS — E785 Hyperlipidemia, unspecified: Secondary | ICD-10-CM | POA: Diagnosis not present

## 2021-05-10 DIAGNOSIS — J3089 Other allergic rhinitis: Secondary | ICD-10-CM

## 2021-05-10 DIAGNOSIS — R45851 Suicidal ideations: Secondary | ICD-10-CM

## 2021-05-10 NOTE — Progress Notes (Signed)
?Location:   [facility] ?Nursing Home Room Number: 09W ?Place of Service:  SNF (31) ?Provider:  Olga Seyler X, NP ? ?Plotnikov, [name], MD ? ?Patient Care Team: ?Plotnikov, [name], MD as PCP - General (Internal Medicine) ? ?Extended Emergency Contact Information ?Primary Emergency Contact: Tanya Walker ?Address: [address] ?         [place], [place] [address] [place] ?Home Phone: [phone] ?Relation: Spouse ?Secondary Emergency Contact: Tanya Walker ?Mobile Phone: [phone] ?Relation: Daughter ? ?Code Status:  FULL CODE ?Goals of care: Advanced Directive information ? ?  05/10/2021  ?  9:53 AM  ?Advanced Directives  ?Does Patient Have a Medical Advance Directive? Yes  ?Type of Paramedic of [place];Living will  ?Does patient want to make changes to medical advance directive? No - Patient declined  ?Copy of [place] in Chart? No - copy requested  ? ? ? ?Chief Complaint  ?Patient presents with  ?? Medical Management of Chronic Issues  ?  Routine follow up visit.  ? ? ?HPI:  ?Pt is a 81 y.o. female seen today for medical management of chronic diseases.   ?  OP, on Evenity ?            Anemia, Hgb 12 12/27/20 ?            HTN, off Amlodipine. Bun/creat 19/0.74 02/25/21 ?            Constipation, takes Senokot S, MiraLax.  ?            Alzheimer's Dementia, Vit B12 499, TSH 2.29 02/25/21, SNF [facility] for supportive care. MMSE 27/30 04/08/21 ?            Vit B12 deficiency, Vit B12 level 499 05/21/20, takes Vit B12 ?            Vitamin D deficiency, takes Vit d daily, Vit D 56.33 02/25/21.  ?            Allergic rhinitis, takes Zyrtec. ?            Hyperlipidemia, diet, LDL 117 01/06/21 ?            Depression, hx of suicidal attempt/ideations, anxious at times,  on Prozac, increased Zyprexa 04/13/21. Mirtazapine made her AMS ?            Chronic back pain, stable, T11, L1 compression fx, takes Tylenol, Gabapentin ?            Weight loss/adult failure  to thrive, weight is stable, didn't tolerate Mirtazapine. ?            Thrombocytopenia, plt 158 12/27/20 ?  ?  ? ? ?Past Medical History:  ?Diagnosis Date  ?? Anxiety   ? no meds  ?? 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 Depression   ? years ago  ?? Headache   ? sinus headaches  ?? Lipoma of arm   ? Right  ?? Medical history non-contributory   ?? Osteoporosis   ?? SVD (spontaneous vaginal delivery)   ? x 2  ?? Varicose vein   ?? Vitamin D deficiency   ? ?Past Surgical History:  ?Procedure Laterality Date  ?? ANTERIOR APPROACH HEMI HIP ARTHROPLASTY Right 09/04/2020  ? Procedure: ANTERIOR APPROACH HEMI HIP ARTHROPLASTY;  Surgeon: Rod Can, MD;  Location: [facility];  Service: Orthopedics;  Laterality: Right;  ?? APPENDECTOMY  1947  ?? CHOLECYSTECTOMY  1988  ?? COLONOSCOPY    ?? GANGLION CYST EXCISION    ? x2 left and right arm  ?? HYSTEROSCOPY WITH D & C N/A 07/15/2013  ? Procedure: DILATATION AND CURETTAGE /HYSTEROSCOPY;  Surgeon: Lovenia Kim, MD;  Location: [facility];  Service: Gynecology;  Laterality: N/A;  ?? KYPHOPLASTY Bilateral 05/04/2020  ? Procedure: KYPHOPLASTY THORACIC TWELVE;  Surgeon: Vallarie Mare, MD;  Location: [facility];  Service: Neurosurgery;  Laterality: Bilateral;  ?? [place]  ?? WISDOM TOOTH EXTRACTION    ? ? ?Allergies  ?Allergen Reactions  ?? Alendronate Sodium Other (See Comments)  ?  Leg cramps and "Allergic," per [facility]  ?? Risedronate Sodium Other (See Comments)  ?  Caused the patient to be achy and is "Allergic," per [facility]  ?? Tramadol Other (See Comments)  ?  Caused the patient to feel badly   ?? Keflex [Cephalexin] Other (See Comments)  ?  Possible rash  ? ? ?Allergies as of 05/10/2021   ? ?   Reactions  ? Alendronate Sodium Other (See Comments)  ? Leg cramps and "Allergic," per [facility]  ? Risedronate Sodium Other (See Comments)  ? Caused the patient to be achy and is "Allergic," per [facility]  ? Tramadol Other (See Comments)  ? Caused the patient to feel badly   ? Keflex  [cephalexin] Other (See Comments)  ? Possible rash  ? ?  ? ?  ?Medication List  ?  ? ?  ? Accurate as of May 10, 2021 11:59 PM. If you have any questions, ask your nurse or doctor.  ?  ?  ? ?  ? ?acetaminophen 500 MG tablet ?Commonly known as: TYLENOL ?Take 1,000 mg by mouth every 6 (six) hours as needed for moderate pain, mild pain, headache or fever. ?  ?ASPIRIN 81 PO ?Take 1 tablet by mouth daily. ?  ?CALCIUM-CARB 600 PO ?Take 1 tablet by mouth 2 (two) times daily. ?  ?cetirizine 10 MG tablet ?Commonly known as: ZyrTEC Allergy ?Take 1 tablet (10 mg total) by mouth daily. ?  ?docusate sodium 100 MG capsule ?Commonly known as: COLACE ?Take 100 mg by mouth at bedtime. ?  ?Evenity 105 MG/1.17ML Sosy injection ?Generic drug: Romosozumab-aqqg ?Inject 210 mg into the skin once. ?  ?FLUoxetine 20 MG capsule ?Commonly known as: PROZAC ?Take 1 capsule (20 mg total) by mouth at bedtime. ?  ?gabapentin 100 MG capsule ?Commonly known as: NEURONTIN ?Take 1 capsule (100 mg total) by mouth 3 (three) times daily. ?  ?melatonin 5 MG Tabs ?Take 5 mg by mouth at bedtime. ?  ?Nizoral A-D 1 % Sham ?Generic drug: KETOCONAZOLE (TOPICAL) ?Apply 1 application topically once a week. Wednesday ?  ?OLANZapine 5 MG tablet ?Commonly known as: ZYPREXA ?Take 1 tablet (5 mg total) by mouth at bedtime. ?What changed: how much to take ?  ?polyethylene glycol 17 g packet ?Commonly known as: MIRALAX / GLYCOLAX ?Take 17 g by mouth daily as needed for moderate constipation or mild constipation. ?What changed: when to take this ?  ?PRESERVISION AREDS 2 PO ?Take 1 capsule by mouth 2 (two) times daily. ?  ?Salonpas 3.02-13-08 % Ptch ?Generic drug: Camphor-Menthol-Methyl Sal ?Apply 1 patch topically See admin instructions. Apply 1 patch to the lower back and remove (at most) after 8 hours- once a day as needed for pain ?  ?senna-docusate 8.6-50 MG tablet ?Commonly known as: Senokot-S ?Take 2 tablets by mouth daily. ?  ?sodium phosphate 7-19 GM/118ML  Enem ?  Place 1 enema rectally daily as needed for severe constipation or mild constipation. ?  ?VITAMIN B12-FOLIC ACID PO ?Place 1 lozenge under the tongue in the morning. ?  ?Vitamin D3 50 MCG (2000 UT) capsule ?Take 1 capsule (2,000 Units total) by mouth daily. ?  ? ?  ? ? ?Review of Systems  ?Constitutional:  Negative for activity change, appetite change and fever.  ?HENT:  Positive for hearing loss. Negative for congestion and voice change.   ?Eyes:  Negative for visual disturbance.  ?Respiratory:  Negative for cough and shortness of breath.   ?Cardiovascular:  Negative for leg swelling.  ?Gastrointestinal:  Negative for abdominal pain and constipation.  ?Genitourinary:  Negative for dysuria and urgency.  ?Musculoskeletal:  Positive for arthralgias, back pain and gait problem.  ?Skin:  Negative for color change.  ?Neurological:  Negative for speech difficulty, weakness and headaches.  ?     Dementia.   ?Psychiatric/Behavioral:  Positive for dysphoric mood and sleep disturbance. Negative for confusion and hallucinations. The patient is nervous/anxious.   ? ?Immunization History  ?Administered Date(s) Administered  ?? DT (Pediatric) 04/10/2002  ?? Fluad Quad(high Dose 65+) 10/28/2019  ?? Influenza Split 03/08/2011, 11/17/2011  ?? Influenza Whole 01/09/2007, 11/05/2008, 11/04/2009  ?? Influenza, High Dose Seasonal PF 12/07/2015, 09/29/2016, 11/09/2017, 09/27/2018, 10/28/2019  ?? Influenza-Unspecified 10/30/2012, 11/29/2013, 12/02/2014, 11/25/2020  ?? Moderna Sars-Covid-2 Vaccination 02/08/2019, 03/11/2019  ?? PFIZER(Purple Top)SARS-COV-2 Vaccination 10/09/2019  ?? PPD Test 12/22/2015, 01/05/2016  ?? Pneumococcal Conjugate-13 07/17/2014  ?? Pneumococcal Polysaccharide-23 08/15/2005, 09/25/2015  ?? Tetanus 12/24/2012  ?? Unspecified SARS-COV-2 Vaccination 02/11/2019, 12/17/2019, 10/27/2020  ?? Zoster Recombinat (Shingrix) 06/07/2019, 09/06/2019  ?? Zoster, Live 12/05/2013  ? ?Pertinent  Health Maintenance Due  ?Topic  Date Due  ?? INFLUENZA VACCINE  09/07/2021  ?? DEXA SCAN  Completed  ? ? ?  01/11/2021  ?  9:00 AM 01/11/2021  ?  8:09 PM 01/12/2021  ?  9:15 AM 01/12/2021  ? 10:01 PM 01/13/2021  ?  8:14 AM  ?Fall Risk  ?Patient Tanya Walker

## 2021-05-11 ENCOUNTER — Encounter: Payer: Self-pay | Admitting: Nurse Practitioner

## 2021-05-11 NOTE — Assessment & Plan Note (Signed)
stable, T11, L1 compression fx, takes Tylenol, Gabapentin 

## 2021-05-11 NOTE — Assessment & Plan Note (Signed)
akes Vit d daily, Vit D 56.33 02/25/21.  ?

## 2021-05-11 NOTE — Assessment & Plan Note (Signed)
Stable

## 2021-05-11 NOTE — Assessment & Plan Note (Signed)
Vit B12 level 499 05/21/20, takes Vit B12 ?

## 2021-05-11 NOTE — Assessment & Plan Note (Signed)
Stable, takes Senokot S, MiraLax. 

## 2021-05-11 NOTE — Assessment & Plan Note (Signed)
Alzheimer's Dementia, Vit B12 499, TSH 2.29 02/25/21, SNF [facility] for supportive care. MMSE 27/30 04/08/21 ?

## 2021-05-11 NOTE — Assessment & Plan Note (Signed)
on Evenity ?

## 2021-05-11 NOTE — Assessment & Plan Note (Signed)
Hgb 12 12/27/20 ?

## 2021-05-11 NOTE — Assessment & Plan Note (Signed)
diet, LDL 117 01/06/21 

## 2021-05-11 NOTE — Assessment & Plan Note (Signed)
Blood pressure is coff Amlodipine. Bun/creat 19/0.74 1/19/23ontrolled,  ?

## 2021-05-11 NOTE — Assessment & Plan Note (Signed)
hx of suicidal attempt/ideations, anxious at times,  on Prozac, increased Zyprexa 04/13/21. Mirtazapine made her AMS ?

## 2021-05-24 DIAGNOSIS — Z20822 Contact with and (suspected) exposure to covid-19: Secondary | ICD-10-CM | POA: Diagnosis not present

## 2021-05-26 NOTE — Telephone Encounter (Addendum)
Per visit note 05/26/21 [facility]): ? ?Tanya Walker returns to clinic for follow-up. She reports she as having significant difficulty with back discomfort in her mid back but also on her right side along the ribcage. Her pain is relieved when she lies down and is worse with standing and especially bending. She is not able to take many pain medications due to the side effects, including tramadol which caused severe constipation. She has been reluctant to start Prolia as she had side effects previously from Fosamax and another medication. ? ? ?Pt archived in [email or web address]. ? ?Please advise if patient and/or provider wish to proceed with EVENITY therpay. ? ?

## 2021-05-26 NOTE — Telephone Encounter (Signed)
I am certainly fine with the Evenity.  However, the patient has refused Evenity in the past.  I can see her in the office if needed. ?Thanks, ?AP ?

## 2021-05-31 ENCOUNTER — Encounter: Payer: Self-pay | Admitting: Nurse Practitioner

## 2021-05-31 ENCOUNTER — Non-Acute Institutional Stay (SKILLED_NURSING_FACILITY): Payer: Medicare Other | Admitting: Nurse Practitioner

## 2021-05-31 DIAGNOSIS — K5901 Slow transit constipation: Secondary | ICD-10-CM | POA: Diagnosis not present

## 2021-05-31 DIAGNOSIS — E559 Vitamin D deficiency, unspecified: Secondary | ICD-10-CM

## 2021-05-31 DIAGNOSIS — E538 Deficiency of other specified B group vitamins: Secondary | ICD-10-CM

## 2021-05-31 DIAGNOSIS — F0280 Dementia in other diseases classified elsewhere without behavioral disturbance: Secondary | ICD-10-CM

## 2021-05-31 DIAGNOSIS — M545 Low back pain, unspecified: Secondary | ICD-10-CM

## 2021-05-31 DIAGNOSIS — I10 Essential (primary) hypertension: Secondary | ICD-10-CM

## 2021-05-31 DIAGNOSIS — R17 Unspecified jaundice: Secondary | ICD-10-CM | POA: Diagnosis not present

## 2021-05-31 DIAGNOSIS — M810 Age-related osteoporosis without current pathological fracture: Secondary | ICD-10-CM

## 2021-05-31 DIAGNOSIS — F32A Depression, unspecified: Secondary | ICD-10-CM | POA: Diagnosis not present

## 2021-05-31 DIAGNOSIS — E785 Hyperlipidemia, unspecified: Secondary | ICD-10-CM | POA: Diagnosis not present

## 2021-05-31 DIAGNOSIS — R45851 Suicidal ideations: Secondary | ICD-10-CM

## 2021-05-31 DIAGNOSIS — J3089 Other allergic rhinitis: Secondary | ICD-10-CM | POA: Diagnosis not present

## 2021-05-31 DIAGNOSIS — G309 Alzheimer's disease, unspecified: Secondary | ICD-10-CM

## 2021-05-31 DIAGNOSIS — D500 Iron deficiency anemia secondary to blood loss (chronic): Secondary | ICD-10-CM

## 2021-05-31 DIAGNOSIS — D696 Thrombocytopenia, unspecified: Secondary | ICD-10-CM

## 2021-05-31 DIAGNOSIS — R634 Abnormal weight loss: Secondary | ICD-10-CM

## 2021-05-31 NOTE — Progress Notes (Signed)
?Location:  [facility] ?Nursing Home Room Number: NO/30/A ?Place of Service:  SNF (31) ?Provider:  Mykira Hofmeister X, NP ? ? ?Plotnikov, [name], MD ? ?Patient Care Team: ?Plotnikov, [name], MD as PCP - General (Internal Medicine) ? ?Extended Emergency Contact Information ?Primary Emergency Contact: Wessells,[name] ?Address: [address] ?         [place], [place] [address] [place] ?Home Phone: [phone] ?Relation: Spouse ?Secondary Emergency Contact: Stegall,[name] ?Mobile Phone: [phone] ?Relation: Daughter ? ?Code Status:  FULL ?Goals of care: Advanced Directive information ? ?  05/31/2021  ? 12:03 PM  ?Advanced Directives  ?Does Patient Have a Medical Advance Directive? Yes  ?Type of Paramedic of [place];Living will  ?Does patient want to make changes to medical advance directive? No - Patient declined  ?Copy of [place] in Chart? No - copy requested  ? ? ? ?Chief Complaint  ?Patient presents with  ? Acute Visit  ?  Patient is being seen for jaundice  ? ? ?HPI:  ?Pt is a 81 y.o. female seen today for an acute visit for yellowish tint skin noted her her face, forehead, no sclera or roof of mouth jaundice observed. The patient denied change of appetite, nausea, vomiting, upset stomach, or abd pain. She stated her urine is pale yellow and stool is brownish in color.  ?  ? OP, on Evenity ?            Anemia, Hgb 12 12/27/20 ?            HTN, off Amlodipine. Bun/creat 19/0.74 02/25/21 ?            Constipation, takes Senokot S, MiraLax.  ?            Alzheimer's Dementia, Vit B12 499, TSH 2.29 02/25/21, SNF [facility] for supportive care. MMSE 27/30 04/08/21 ?            Vit B12 deficiency, Vit B12 level 499 05/21/20, takes Vit B12 ?            Vitamin D deficiency, takes Vit d daily, Vit D 56.33 02/25/21.  ?            Allergic rhinitis, takes Zyrtec. ?            Hyperlipidemia, diet, LDL 117 01/06/21 ?            Depression, hx of suicidal  attempt/ideations, anxious at times,  on Prozac, increased Zyprexa 04/13/21. Mirtazapine made her AMS ?            Chronic back pain, stable, T11, L1 compression fx, takes Tylenol, Gabapentin ?            Weight loss/adult failure to thrive, weight is stable, didn't tolerate Mirtazapine. ?            Thrombocytopenia, plt 158 12/27/20 ?  ?  ? ?Past Medical History:  ?Diagnosis Date  ? Anxiety   ? no meds  ? 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 Depression   ? years ago  ? Headache   ? sinus headaches  ? Lipoma of arm   ? Right  ? Medical history non-contributory   ? Osteoporosis   ? SVD (spontaneous vaginal delivery)   ? x 2  ? Varicose vein   ? Vitamin D deficiency   ? ?Past Surgical History:  ?Procedure Laterality Date  ? ANTERIOR  APPROACH HEMI HIP ARTHROPLASTY Right 09/04/2020  ? Procedure: ANTERIOR APPROACH HEMI HIP ARTHROPLASTY;  Surgeon: Rod Can, MD;  Location: [facility];  Service: Orthopedics;  Laterality: Right;  ? APPENDECTOMY  1947  ? CHOLECYSTECTOMY  1988  ? COLONOSCOPY    ? GANGLION CYST EXCISION    ? x2 left and right arm  ? HYSTEROSCOPY WITH D & C N/A 07/15/2013  ? Procedure: DILATATION AND CURETTAGE /HYSTEROSCOPY;  Surgeon: Lovenia Kim, MD;  Location: [facility];  Service: Gynecology;  Laterality: N/A;  ? KYPHOPLASTY Bilateral 05/04/2020  ? Procedure: KYPHOPLASTY THORACIC TWELVE;  Surgeon: Vallarie Mare, MD;  Location: [facility];  Service: Neurosurgery;  Laterality: Bilateral;  ? [place]  ? WISDOM TOOTH EXTRACTION    ? ? ?Allergies  ?Allergen Reactions  ? Alendronate Sodium Other (See Comments)  ?  Leg cramps and "Allergic," per [facility]  ? Risedronate Sodium Other (See Comments)  ?  Caused the patient to be achy and is "Allergic," per [facility]  ? Tramadol Other (See Comments)  ?  Caused the patient to feel badly   ? Keflex [Cephalexin] Other (See Comments)  ?  Possible rash  ? ? ?Outpatient Encounter Medications as of 05/31/2021  ?Medication Sig  ? acetaminophen (TYLENOL) 500 MG tablet  Take 1,000 mg by mouth every 6 (six) hours as needed for moderate pain, mild pain, headache or fever.  ? ASPIRIN 81 PO Take 1 tablet by mouth daily.  ? Calcium Carbonate (CALCIUM-CARB 600 PO) Take 1 tablet by mouth 2 (two) times daily.  ? cetirizine (ZYRTEC ALLERGY) 10 MG tablet Take 1 tablet (10 mg total) by mouth daily.  ? Cholecalciferol (VITAMIN D3) 2000 units capsule Take 1 capsule (2,000 Units total) by mouth daily.  ? Cobalamin Combinations (VITAMIN B12-FOLIC ACID PO) Place 1 lozenge under the tongue in the morning.  ? docusate sodium (COLACE) 100 MG capsule Take 100 mg by mouth at bedtime.  ? FLUoxetine (PROZAC) 20 MG capsule Take 1 capsule (20 mg total) by mouth at bedtime.  ? gabapentin (NEURONTIN) 100 MG capsule Take 1 capsule (100 mg total) by mouth 3 (three) times daily.  ? KETOCONAZOLE, TOPICAL, (NIZORAL A-D) 1 % SHAM Apply 1 application topically once a week. Wednesday  ? melatonin 5 MG TABS Take 5 mg by mouth at bedtime.  ? Multiple Vitamins-Minerals (PRESERVISION AREDS 2 PO) Take 1 capsule by mouth 2 (two) times daily.  ? OLANZapine (ZYPREXA) 5 MG tablet Take 1 tablet (5 mg total) by mouth at bedtime. (Patient taking differently: Take 7.5 mg by mouth at bedtime.)  ? polyethylene glycol (MIRALAX / GLYCOLAX) 17 g packet Take 17 g by mouth daily as needed for moderate constipation or mild constipation. (Patient taking differently: Take 17 g by mouth daily.)  ? Romosozumab-aqqg (EVENITY) 105 MG/1.17ML SOSY injection Inject 210 mg into the skin once.  ? SALONPAS 3.02-13-08 % PTCH Apply 1 patch topically See admin instructions. Apply 1 patch to the lower back and remove (at most) after 8 hours- once a day as needed for pain  ? senna-docusate (SENOKOT-S) 8.6-50 MG tablet Take 2 tablets by mouth daily.  ? sodium phosphate (FLEET) 7-19 GM/118ML ENEM Place 1 enema rectally daily as needed for severe constipation or mild constipation.  ? ?No facility-administered encounter medications on file as of 05/31/2021.   ? ? ?Review of Systems  ?Constitutional:  Negative for activity change, appetite change and fever.  ?HENT:  Positive for hearing loss. Negative for congestion and  voice change.   ?Eyes:  Negative for visual disturbance.  ?Respiratory:  Negative for cough and shortness of breath.   ?Cardiovascular:  Negative for leg swelling.  ?Gastrointestinal:  Positive for abdominal distention, nausea and vomiting. Negative for abdominal pain and constipation.  ?Genitourinary:  Negative for dysuria and urgency.  ?Musculoskeletal:  Positive for arthralgias, back pain and gait problem.  ?Skin:  Positive for color change.  ?     Yellow tint skin face and forehead.   ?Neurological:  Negative for speech difficulty, weakness and headaches.  ?     Dementia.   ?Psychiatric/Behavioral:  Positive for dysphoric mood and sleep disturbance. Negative for confusion and hallucinations. The patient is nervous/anxious.   ? ?Immunization History  ?Administered Date(s) Administered  ? DT (Pediatric) 04/10/2002  ? Fluad Quad(high Dose 65+) 10/28/2019  ? Influenza Split 03/08/2011, 11/17/2011  ? Influenza Whole 01/09/2007, 11/05/2008, 11/04/2009  ? Influenza, High Dose Seasonal PF 12/07/2015, 09/29/2016, 11/09/2017, 09/27/2018, 10/28/2019  ? Influenza-Unspecified 10/30/2012, 11/29/2013, 12/02/2014, 11/25/2020  ? Moderna Sars-Covid-2 Vaccination 02/08/2019, 03/11/2019  ? PFIZER(Purple Top)SARS-COV-2 Vaccination 10/09/2019  ? PPD Test 12/22/2015, 01/05/2016  ? Pneumococcal Conjugate-13 07/17/2014  ? Pneumococcal Polysaccharide-23 08/15/2005, 09/25/2015  ? Tetanus 12/24/2012  ? Unspecified SARS-COV-2 Vaccination 02/11/2019, 12/17/2019, 10/27/2020  ? Zoster Recombinat (Shingrix) 06/07/2019, 09/06/2019  ? Zoster, Live 12/05/2013  ? ?Pertinent  Health Maintenance Due  ?Topic Date Due  ? INFLUENZA VACCINE  09/07/2021  ? DEXA SCAN  Completed  ? ? ?  01/11/2021  ?  9:00 AM 01/11/2021  ?  8:09 PM 01/12/2021  ?  9:15 AM 01/12/2021  ? 10:01 PM 01/13/2021  ?  8:14 AM   ?Fall Risk  ?Patient Fall Risk Level       ?  ? Information is confidential and restricted. Go to Review Flowsheets to unlock data.  ? ?Functional Status Survey: ?  ? ?Vitals:  ? 05/31/21 1201  ?BP: 110/72  ?Pulse: 64

## 2021-06-01 ENCOUNTER — Encounter: Payer: Self-pay | Admitting: Nurse Practitioner

## 2021-06-01 DIAGNOSIS — I10 Essential (primary) hypertension: Secondary | ICD-10-CM | POA: Diagnosis not present

## 2021-06-01 DIAGNOSIS — R627 Adult failure to thrive: Secondary | ICD-10-CM | POA: Diagnosis not present

## 2021-06-01 DIAGNOSIS — R17 Unspecified jaundice: Secondary | ICD-10-CM | POA: Insufficient documentation

## 2021-06-01 LAB — BASIC METABOLIC PANEL
BUN: 17 (ref 4–21)
CO2: 30 — AB (ref 13–22)
Chloride: 101 (ref 99–108)
Creatinine: 0.6 (ref 0.5–1.1)
Glucose: 71
Potassium: 4.2 mEq/L (ref 3.5–5.1)
Sodium: 140 (ref 137–147)

## 2021-06-01 LAB — CBC AND DIFFERENTIAL
HCT: 43 (ref 36–46)
Hemoglobin: 13.8 (ref 12.0–16.0)
Neutrophils Absolute: 3031
Platelets: 180 10*3/uL (ref 150–400)
WBC: 5.5

## 2021-06-01 LAB — HEPATIC FUNCTION PANEL
ALT: 17 U/L (ref 7–35)
AST: 23 (ref 13–35)
Alkaline Phosphatase: 67 (ref 25–125)
Bilirubin, Direct: 0.1 (ref 0.01–0.4)
Bilirubin, Total: 0.5

## 2021-06-01 LAB — COMPREHENSIVE METABOLIC PANEL
Albumin: 4 (ref 3.5–5.0)
Calcium: 9.2 (ref 8.7–10.7)
Globulin: 3.1
eGFR: 91

## 2021-06-01 LAB — CBC: RBC: 4.58 (ref 3.87–5.11)

## 2021-06-01 NOTE — Assessment & Plan Note (Signed)
takes Vit d daily, Vit D 56.33 02/25/21.  ?

## 2021-06-01 NOTE — Assessment & Plan Note (Signed)
Stable,  takes Zyrtec. 

## 2021-06-01 NOTE — Assessment & Plan Note (Signed)
plt 158 12/27/20 ?  ?

## 2021-06-01 NOTE — Assessment & Plan Note (Signed)
Hgb 12 12/27/20 ?

## 2021-06-01 NOTE — Assessment & Plan Note (Signed)
yellowish tint skin noted her her face, forehead, no sclera or roof of mouth jaundice observed. The patient denied change of appetite, nausea, vomiting, upset stomach, or abd pain. She stated her urine is pale yellow and stool is brownish in color.  ?Update CBC/diff, BMP, LFT ?

## 2021-06-01 NOTE — Assessment & Plan Note (Signed)
diet, LDL 117 01/06/21 

## 2021-06-01 NOTE — Assessment & Plan Note (Signed)
Blood pressure is controlled,  off Amlodipine. Bun/creat 19/0.74 02/25/21 ?

## 2021-06-01 NOTE — Assessment & Plan Note (Signed)
Vit B12 level 499 05/21/20, takes Vit B12 ?

## 2021-06-01 NOTE — Assessment & Plan Note (Signed)
Her mood is stable, hx of suicidal attempt/ideations, anxious at times,  on Prozac, increased Zyprexa 04/13/21. Mirtazapine made her AMS  ?

## 2021-06-01 NOTE — Assessment & Plan Note (Signed)
stable, T11, L1 compression fx, takes Tylenol, Gabapentin 

## 2021-06-01 NOTE — Assessment & Plan Note (Signed)
on Evenity ?

## 2021-06-01 NOTE — Assessment & Plan Note (Signed)
Vit B12 499, TSH 2.29 02/25/21, SNF [facility] for supportive care. MMSE 27/30 04/08/21, no behavioral issues.  ?

## 2021-06-01 NOTE — Assessment & Plan Note (Signed)
weight is stable, didn't tolerate Mirtazapine. 

## 2021-06-01 NOTE — Assessment & Plan Note (Signed)
Stable, takes Senokot S, MiraLax. 

## 2021-06-08 DIAGNOSIS — Z20822 Contact with and (suspected) exposure to covid-19: Secondary | ICD-10-CM | POA: Diagnosis not present

## 2021-06-09 ENCOUNTER — Non-Acute Institutional Stay (SKILLED_NURSING_FACILITY): Payer: Medicare Other | Admitting: Orthopedic Surgery

## 2021-06-09 ENCOUNTER — Encounter: Payer: Self-pay | Admitting: Orthopedic Surgery

## 2021-06-09 DIAGNOSIS — M810 Age-related osteoporosis without current pathological fracture: Secondary | ICD-10-CM | POA: Diagnosis not present

## 2021-06-09 DIAGNOSIS — G309 Alzheimer's disease, unspecified: Secondary | ICD-10-CM | POA: Diagnosis not present

## 2021-06-09 DIAGNOSIS — D500 Iron deficiency anemia secondary to blood loss (chronic): Secondary | ICD-10-CM | POA: Diagnosis not present

## 2021-06-09 DIAGNOSIS — I10 Essential (primary) hypertension: Secondary | ICD-10-CM | POA: Diagnosis not present

## 2021-06-09 DIAGNOSIS — F419 Anxiety disorder, unspecified: Secondary | ICD-10-CM

## 2021-06-09 DIAGNOSIS — R45851 Suicidal ideations: Secondary | ICD-10-CM

## 2021-06-09 DIAGNOSIS — K5901 Slow transit constipation: Secondary | ICD-10-CM | POA: Diagnosis not present

## 2021-06-09 DIAGNOSIS — R634 Abnormal weight loss: Secondary | ICD-10-CM

## 2021-06-09 DIAGNOSIS — F32A Depression, unspecified: Secondary | ICD-10-CM | POA: Diagnosis not present

## 2021-06-09 DIAGNOSIS — R17 Unspecified jaundice: Secondary | ICD-10-CM | POA: Diagnosis not present

## 2021-06-09 DIAGNOSIS — F0280 Dementia in other diseases classified elsewhere without behavioral disturbance: Secondary | ICD-10-CM | POA: Diagnosis not present

## 2021-06-09 MED ORDER — ALPRAZOLAM 0.25 MG PO TABS: 0.2500 mg | ORAL_TABLET | Freq: Every day | ORAL | 0 refills | Status: DC | PRN

## 2021-06-09 NOTE — Progress Notes (Signed)
?Location:  [facility] ?Nursing Home Room Number: N030/A ?Place of Service:  SNF (31) ?Provider: Yvonna Alanis, NP ? ? ?Patient Care Team: ?Virgie Dad, MD as PCP - General (Internal Medicine) ? ?Extended Emergency Contact Information ?Primary Emergency Contact: Fraley,[name] ?Address: [address] ?         [place], [place] [address] [place] ?Home Phone: [phone] ?Relation: Spouse ?Secondary Emergency Contact: Stegall,[name] ?Mobile Phone: [phone] ?Relation: Daughter ? ?Code Status:  Full Code ?Goals of care: Advanced Directive information ? ?  06/09/2021  ?  2:03 PM  ?Advanced Directives  ?Does Patient Have a Medical Advance Directive? Yes  ?Type of Paramedic of [place];Living will  ?Does patient want to make changes to medical advance directive? No - Patient declined  ?Copy of [place] in Chart? Yes - validated most recent copy scanned in chart (See row information)  ? ? ? ?Chief Complaint  ?Patient presents with  ? Acute Visit  ?  Abnormal behaviors  ? ? ?HPI:  ?Pt is a 81 y.o. female seen today for an acute visit for abnormal behaviors.  ? ?05/03 she was found in the bathroom with her hand down her throat. She was apparently trying to make herself gag. Episode occurred after breakfast. She denied abdominal pain and N/V today. I asked her if she was trying to hurt herself, and she said "no."  She states a pill was stuck in her throat. She was seen by ST 05/04/2021, no dysphagia noted. She was noted to have increased anxiety.  She is anxious during out encounter today. Denies recent panic attack. Hospitalized 11/20-11/23 for suicidal ideation. She is currently taking Prozac 20 mg daily and Zyprexa 7.5 mg (recently increased) daily. Unsuccessful trial of mirtazapine due to AMS. She is also followed by [facility].  ? ?04/24 she was noted to have yellowish skin, specifically face and arms. No jaundice noted to sclera or mouth.  Total bilirubin 0.5, direct bilirubin 0.1, indirect bilirubin 0.4, alkaline phos 67, AST/ALT 23/17, WBC 5.5, hgb 13.8 06/01/2021.  ? ?Alzheimer's- MMSE 27/30 04/2021, remains on skilled nursing unit ?HTN- BUN/creat 17/0.57 06/01/2021, off amlodipine ?Osteoporosis- DEXA 2017, t score -3.3 femur, remains on Evenity ?Constipation- LBM 05/01, remains on senna, colace and miralax ?Anemia- Hgb 13.8 06/01/2021 ?Weight loss- BMI 14.85, frail appearance, nursing reports she does not eat or drink well  ? ? ?Past Medical History:  ?Diagnosis Date  ? Anxiety   ? no meds  ? Cancer [facility])   ? 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 Depression   ? years ago  ? Headache   ? sinus headaches  ? Lipoma of arm   ? Right  ? Medical history non-contributory   ? Osteoporosis   ? SVD (spontaneous vaginal delivery)   ? x 2  ? Varicose vein   ? Vitamin D deficiency   ? ?Past Surgical History:  ?Procedure Laterality Date  ? ANTERIOR APPROACH HEMI HIP ARTHROPLASTY Right 09/04/2020  ? Procedure: ANTERIOR APPROACH HEMI HIP ARTHROPLASTY;  Surgeon: Rod Can, MD;  Location: [facility];  Service: Orthopedics;  Laterality: Right;  ? APPENDECTOMY  1947  ? CHOLECYSTECTOMY  1988  ? COLONOSCOPY    ? GANGLION CYST EXCISION    ? x2 left and right arm  ? HYSTEROSCOPY WITH D & C N/A 07/15/2013  ? Procedure: DILATATION AND CURETTAGE /HYSTEROSCOPY;  Surgeon: Lovenia Kim, MD;  Location: [facility];  Service:  Gynecology;  Laterality: N/A;  ? KYPHOPLASTY Bilateral 05/04/2020  ? Procedure: KYPHOPLASTY THORACIC TWELVE;  Surgeon: Vallarie Mare, MD;  Location: [facility];  Service: Neurosurgery;  Laterality: Bilateral;  ? [place]  ? WISDOM TOOTH EXTRACTION    ? ? ?Allergies  ?Allergen Reactions  ? Alendronate Sodium Other (See Comments)  ?  Leg cramps and "Allergic," per MAR  ? Risedronate Sodium Other (See Comments)  ?  Caused the patient to be achy and is "Allergic," per [facility]  ? Tramadol Other (See Comments)  ?  Caused the patient to feel badly   ? Keflex  [Cephalexin] Other (See Comments)  ?  Possible rash  ? ? ?Outpatient Encounter Medications as of 06/09/2021  ?Medication Sig  ? acetaminophen (TYLENOL) 500 MG tablet Take 1,000 mg by mouth every 8 (eight) hours as needed for moderate pain, mild pain, headache or fever.  ? ASPIRIN 81 PO Take 1 tablet by mouth daily.  ? Calcium Carbonate (CALCIUM-CARB 600 PO) Take 1 tablet by mouth 2 (two) times daily.  ? cetirizine (ZYRTEC ALLERGY) 10 MG tablet Take 1 tablet (10 mg total) by mouth daily.  ? Cholecalciferol (VITAMIN D3) 2000 units capsule Take 1 capsule (2,000 Units total) by mouth daily.  ? Cobalamin Combinations (VITAMIN B12-FOLIC ACID PO) Place 1 lozenge under the tongue in the morning.  ? docusate sodium (COLACE) 100 MG capsule Take 100 mg by mouth at bedtime.  ? FLUoxetine (PROZAC) 20 MG capsule Take 1 capsule (20 mg total) by mouth at bedtime.  ? gabapentin (NEURONTIN) 100 MG capsule Take 1 capsule (100 mg total) by mouth 3 (three) times daily.  ? KETOCONAZOLE, TOPICAL, (NIZORAL A-D) 1 % SHAM Apply 1 application. topically once a week. Monday  ? melatonin 5 MG TABS Take 5 mg by mouth at bedtime.  ? Multiple Vitamins-Minerals (PRESERVISION AREDS 2 PO) Take 1 capsule by mouth 2 (two) times daily.  ? OLANZapine (ZYPREXA) 7.5 MG tablet Take 7.5 mg by mouth at bedtime.  ? polyethylene glycol (MIRALAX / GLYCOLAX) 17 g packet Take 17 g by mouth daily.  ? Romosozumab-aqqg (EVENITY) 105 MG/1.17ML SOSY injection Inject 210 mg into the skin once. Once a day on the 5th of the month  ? SALONPAS 3.02-13-08 % PTCH Apply 1 patch topically See admin instructions. Apply 1 patch to the lower back and remove (at most) after 8 hours- once a day as needed for pain  ? senna-docusate (SENOKOT-S) 8.6-50 MG tablet Take 2 tablets by mouth daily.  ? sodium phosphate (FLEET) 7-19 GM/118ML ENEM Place 1 enema rectally daily as needed for severe constipation or mild constipation.  ? [DISCONTINUED] OLANZapine (ZYPREXA) 5 MG tablet Take 1 tablet (5  mg total) by mouth at bedtime. (Patient taking differently: Take 7.5 mg by mouth at bedtime.)  ? [DISCONTINUED] polyethylene glycol (MIRALAX / GLYCOLAX) 17 g packet Take 17 g by mouth daily as needed for moderate constipation or mild constipation. (Patient taking differently: Take 17 g by mouth daily.)  ? ?No facility-administered encounter medications on file as of 06/09/2021.  ? ? ?Review of Systems  ?Constitutional:  Negative for activity change, appetite change, chills, fatigue and fever.  ?HENT:  Negative for trouble swallowing.   ?Eyes:  Negative for visual disturbance.  ?Respiratory:  Negative for shortness of breath, wheezing and stridor.   ?Cardiovascular:  Negative for chest pain and leg swelling.  ?Gastrointestinal:  Positive for constipation. Negative for abdominal distention, abdominal pain, diarrhea, nausea and vomiting.  ?  Genitourinary:  Negative for dysuria, frequency and hematuria.  ?Musculoskeletal:  Positive for gait problem.  ?Skin:  Negative for wound.  ?Neurological:  Positive for weakness. Negative for dizziness and headaches.  ?Psychiatric/Behavioral:  Positive for behavioral problems, confusion and dysphoric mood. Negative for sleep disturbance and suicidal ideas. The patient is nervous/anxious.   ? ?Immunization History  ?Administered Date(s) Administered  ? DT (Pediatric) 04/10/2002  ? Fluad Quad(high Dose 65+) 10/28/2019  ? Influenza Split 03/08/2011, 11/17/2011  ? Influenza Whole 01/09/2007, 11/05/2008, 11/04/2009  ? Influenza, High Dose Seasonal PF 12/07/2015, 09/29/2016, 11/09/2017, 09/27/2018, 10/28/2019  ? Influenza-Unspecified 10/30/2012, 11/29/2013, 12/02/2014, 11/25/2020  ? Moderna Sars-Covid-2 Vaccination 02/08/2019, 03/11/2019  ? PFIZER(Purple Top)SARS-COV-2 Vaccination 10/09/2019  ? PPD Test 12/22/2015, 01/05/2016  ? Pneumococcal Conjugate-13 07/17/2014  ? Pneumococcal Polysaccharide-23 08/15/2005, 09/25/2015  ? Tetanus 12/24/2012  ? Unspecified SARS-COV-2 Vaccination 02/11/2019,  12/17/2019, 10/27/2020  ? Zoster Recombinat (Shingrix) 06/07/2019, 09/06/2019  ? Zoster, Live 12/05/2013  ? ?Pertinent  Health Maintenance Due  ?Topic Date Due  ? INFLUENZA VACCINE  09/07/2021  ? DEXA SCAN  Com

## 2021-06-10 ENCOUNTER — Encounter: Payer: Self-pay | Admitting: Internal Medicine

## 2021-06-10 ENCOUNTER — Non-Acute Institutional Stay (SKILLED_NURSING_FACILITY): Payer: Medicare Other | Admitting: Internal Medicine

## 2021-06-10 DIAGNOSIS — F419 Anxiety disorder, unspecified: Secondary | ICD-10-CM

## 2021-06-10 DIAGNOSIS — F03A2 Unspecified dementia, mild, with psychotic disturbance: Secondary | ICD-10-CM | POA: Diagnosis not present

## 2021-06-10 DIAGNOSIS — F322 Major depressive disorder, single episode, severe without psychotic features: Secondary | ICD-10-CM

## 2021-06-10 NOTE — Progress Notes (Signed)
?Location:   [facility] ?Nursing Home Room Number: 30 ?Place of Service:  SNF (31) ?Provider:  Veleta Miners MD ? ?Tanya Dad, MD ? ?Patient Care Team: ?Tanya Dad, MD as PCP - General (Internal Medicine) ? ?Extended Emergency Contact Information ?Primary Emergency Contact: Tanya Walker ?Address: [address] ?         [place], [place] [address] [place] ?Home Phone: [phone] ?Relation: Spouse ?Secondary Emergency Contact: Tanya Walker ?Mobile Phone: [phone] ?Relation: Daughter ? ?Code Status:  Full Code ?Goals of care: Advanced Directive information ? ?  06/10/2021  ?  3:20 PM  ?Advanced Directives  ?Does Patient Have a Medical Advance Directive? Yes  ?Type of Paramedic of [place];Living will  ?Does patient want to make changes to medical advance directive? No - Patient declined  ?Copy of [place] in Chart? Yes - validated most recent copy scanned in chart (See row information)  ? ? ? ?Chief Complaint  ?Patient presents with  ? Acute Visit  ?  Behaviors  ? ? ?HPI:  ?Pt is a 81 y.o. female seen today for an acute visit for having anxiety ?  Suicidal ideation ? ?Patient lives in SNF ? ?Patient has h/o Osteoporosis with Multiple Compression Fracture on Evenity per Dr Tanya Walker ?Was also admitted in the hospital from 7/12-7/14 for Change in Mental status most likely due to Remeron her CT scan showed moderate atrophy with chronic microvascular ischemia ?S/p right hip hemiarthroplasty ?Admitted in the hospital from 12/30/20-01/13/21 For Suicide attempt and Acute depression ? ?Since then patient has been in SNF.  She states independent in her ADLs and walks with her walker but due to her cognition she continues to need skilled care to level of care. ?Since yesterday patient has been having increased anxiety.  She has been trying to gag herself.Denies that she wants to hurt herself ?Amy had started her on Xanax yesterday patient has  been refusing some of her meds. ?Today she seemed calmer Walking with her walker ?Still anxious ?C/o Some pain in her lower back but does not want anything stronger. Then Tylenol ? ? ?Past Medical History:  ?Diagnosis Date  ? Anxiety   ? no meds  ? 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 Depression   ? years ago  ? Headache   ? sinus headaches  ? Lipoma of arm   ? Right  ? Medical history non-contributory   ? Osteoporosis   ? SVD (spontaneous vaginal delivery)   ? x 2  ? Varicose vein   ? Vitamin D deficiency   ? ?Past Surgical History:  ?Procedure Laterality Date  ? ANTERIOR APPROACH HEMI HIP ARTHROPLASTY Right 09/04/2020  ? Procedure: ANTERIOR APPROACH HEMI HIP ARTHROPLASTY;  Surgeon: Tanya Can, MD;  Location: [facility];  Service: Orthopedics;  Laterality: Right;  ? APPENDECTOMY  1947  ? CHOLECYSTECTOMY  1988  ? COLONOSCOPY    ? GANGLION CYST EXCISION    ? x2 left and right arm  ? HYSTEROSCOPY WITH D & C N/A 07/15/2013  ? Procedure: DILATATION AND CURETTAGE /HYSTEROSCOPY;  Surgeon: Tanya Kim, MD;  Location: [facility];  Service: Gynecology;  Laterality: N/A;  ? KYPHOPLASTY Bilateral 05/04/2020  ? Procedure: KYPHOPLASTY THORACIC TWELVE;  Surgeon: Tanya Mare, MD;  Location: [facility];  Service: Neurosurgery;  Laterality: Bilateral;  ? [place]  ? WISDOM TOOTH EXTRACTION    ? ? ?Allergies  ?  Allergen Reactions  ? Alendronate Sodium Other (See Comments)  ?  Leg cramps and "Allergic," per MAR  ? Risedronate Sodium Other (See Comments)  ?  Caused the patient to be achy and is "Allergic," per [facility]  ? Tramadol Other (See Comments)  ?  Caused the patient to feel badly   ? Keflex [Cephalexin] Other (See Comments)  ?  Possible rash  ? ? ?Allergies as of 06/10/2021   ? ?   Reactions  ? Alendronate Sodium Other (See Comments)  ? Leg cramps and "Allergic," per MAR  ? Risedronate Sodium Other (See Comments)  ? Caused the patient to be achy and is "Allergic," per [facility]  ? Tramadol Other (See Comments)  ?  Caused the patient to feel badly   ? Keflex [cephalexin] Other (See Comments)  ? Possible rash  ? ?  ? ?  ?Medication List  ?  ? ?  ? Accurate as of Jun 10, 2021  3:21 PM. If you have any questions, ask your nurse or doctor.  ?  ?  ? ?  ? ?acetaminophen 500 MG tablet ?Commonly known as: TYLENOL ?Take 1,000 mg by mouth every 8 (eight) hours as needed for moderate pain, mild pain, headache or fever. ?  ?ALPRAZolam 0.25 MG tablet ?Commonly known as: Duanne Moron ?Take 1 tablet (0.25 mg total) by mouth daily as needed for up to 14 days for anxiety. ?  ?ASPIRIN 81 PO ?Take 1 tablet by mouth daily. ?  ?CALCIUM-CARB 600 PO ?Take 1 tablet by mouth 2 (two) times daily. ?  ?cetirizine 10 MG tablet ?Commonly known as: ZyrTEC Allergy ?Take 1 tablet (10 mg total) by mouth daily. ?  ?docusate sodium 100 MG capsule ?Commonly known as: COLACE ?Take 100 mg by mouth at bedtime. ?  ?Evenity 105 MG/1.17ML Sosy injection ?Generic drug: Romosozumab-aqqg ?Inject 210 mg into the skin once. Once a day on the 5th of the month ?  ?FLUoxetine 20 MG capsule ?Commonly known as: PROZAC ?Take 1 capsule (20 mg total) by mouth at bedtime. ?  ?gabapentin 100 MG capsule ?Commonly known as: NEURONTIN ?Take 1 capsule (100 mg total) by mouth 3 (three) times daily. ?  ?melatonin 5 MG Tabs ?Take 5 mg by mouth at bedtime. ?  ?Nizoral A-D 1 % Sham ?Generic drug: KETOCONAZOLE (TOPICAL) ?Apply 1 application. topically once a week. Monday ?  ?OLANZapine 7.5 MG tablet ?Commonly known as: ZYPREXA ?Take 7.5 mg by mouth at bedtime. ?  ?polyethylene glycol 17 g packet ?Commonly known as: MIRALAX / GLYCOLAX ?Take 17 g by mouth daily. ?  ?PRESERVISION AREDS 2 PO ?Take 1 capsule by mouth 2 (two) times daily. ?  ?Salonpas 3.02-13-08 % Ptch ?Generic drug: Camphor-Menthol-Methyl Sal ?Apply 1 patch topically See admin instructions. Apply 1 patch to the lower back and remove (at most) after 8 hours- once a day as needed for pain ?  ?senna-docusate 8.6-50 MG tablet ?Commonly known as:  Senokot-S ?Take 2 tablets by mouth daily. ?  ?sodium phosphate 7-19 GM/118ML Enem ?Place 1 enema rectally daily as needed for severe constipation or mild constipation. ?  ?VITAMIN B12-FOLIC ACID PO ?Place 1 lozenge under the tongue in the morning. ?  ?Vitamin D3 50 MCG (2000 UT) capsule ?Take 1 capsule (2,000 Units total) by mouth daily. ?  ? ?  ? ? ?Review of Systems  ?Constitutional:  Negative for activity change and appetite change.  ?HENT: Negative.    ?Respiratory:  Negative for cough and shortness of breath.   ?  Cardiovascular:  Negative for leg swelling.  ?Gastrointestinal:  Positive for constipation.  ?Genitourinary: Negative.   ?Musculoskeletal:  Positive for back pain. Negative for arthralgias, gait problem and myalgias.  ?Skin: Negative.   ?Neurological:  Negative for dizziness and weakness.  ?Psychiatric/Behavioral:  Positive for confusion and dysphoric mood. Negative for sleep disturbance. The patient is nervous/anxious.   ? ?Immunization History  ?Administered Date(s) Administered  ? DT (Pediatric) 04/10/2002  ? Fluad Quad(high Dose 65+) 10/28/2019  ? Influenza Split 03/08/2011, 11/17/2011  ? Influenza Whole 01/09/2007, 11/05/2008, 11/04/2009  ? Influenza, High Dose Seasonal PF 12/07/2015, 09/29/2016, 11/09/2017, 09/27/2018, 10/28/2019  ? Influenza-Unspecified 10/30/2012, 11/29/2013, 12/02/2014, 11/25/2020  ? Moderna Sars-Covid-2 Vaccination 02/08/2019, 03/11/2019  ? PFIZER(Purple Top)SARS-COV-2 Vaccination 10/09/2019  ? PPD Test 12/22/2015, 01/05/2016  ? Pneumococcal Conjugate-13 07/17/2014  ? Pneumococcal Polysaccharide-23 08/15/2005, 09/25/2015  ? Tetanus 12/24/2012  ? Unspecified SARS-COV-2 Vaccination 02/11/2019, 12/17/2019, 10/27/2020  ? Zoster Recombinat (Shingrix) 06/07/2019, 09/06/2019  ? Zoster, Live 12/05/2013  ? ?Pertinent  Health Maintenance Due  ?Topic Date Due  ? INFLUENZA VACCINE  09/07/2021  ? DEXA SCAN  Completed  ? ? ?  01/11/2021  ?  9:00 AM 01/11/2021  ?  8:09 PM 01/12/2021  ?  9:15 AM  01/12/2021  ? 10:01 PM 01/13/2021  ?  8:14 AM  ?Fall Risk  ?Patient Fall Risk Level       ?  ? Information is confidential and restricted. Go to Review Flowsheets to unlock data.  ? ?Functional Status Surv

## 2021-06-14 DIAGNOSIS — Z20822 Contact with and (suspected) exposure to covid-19: Secondary | ICD-10-CM | POA: Diagnosis not present

## 2021-06-23 ENCOUNTER — Encounter: Payer: Self-pay | Admitting: Orthopedic Surgery

## 2021-06-23 ENCOUNTER — Non-Acute Institutional Stay (SKILLED_NURSING_FACILITY): Payer: Medicare Other | Admitting: Orthopedic Surgery

## 2021-06-23 DIAGNOSIS — F419 Anxiety disorder, unspecified: Secondary | ICD-10-CM

## 2021-06-23 DIAGNOSIS — F03A2 Unspecified dementia, mild, with psychotic disturbance: Secondary | ICD-10-CM

## 2021-06-23 DIAGNOSIS — M810 Age-related osteoporosis without current pathological fracture: Secondary | ICD-10-CM | POA: Diagnosis not present

## 2021-06-23 DIAGNOSIS — R634 Abnormal weight loss: Secondary | ICD-10-CM

## 2021-06-23 DIAGNOSIS — K5901 Slow transit constipation: Secondary | ICD-10-CM | POA: Diagnosis not present

## 2021-06-23 DIAGNOSIS — R17 Unspecified jaundice: Secondary | ICD-10-CM

## 2021-06-23 DIAGNOSIS — F322 Major depressive disorder, single episode, severe without psychotic features: Secondary | ICD-10-CM

## 2021-06-23 DIAGNOSIS — I10 Essential (primary) hypertension: Secondary | ICD-10-CM | POA: Diagnosis not present

## 2021-06-23 NOTE — Progress Notes (Signed)
?Location:  [facility] ?Nursing Home Room Number: N030/A ?Place of Service:  SNF (31) ?Provider: Yvonna Alanis, NP ? ?Patient Care Team: ?Virgie Dad, MD as PCP - General (Internal Medicine) ? ?Extended Emergency Contact Information ?Primary Emergency Contact: Mateus,[name] ?Address: [address] ?         [place], [place] [address] [place] ?Home Phone: [phone] ?Relation: Spouse ?Secondary Emergency Contact: Stegall,[name] ?Mobile Phone: [phone] ?Relation: Daughter ? ?Code Status:  Full code ?Goals of care: Advanced Directive information ? ?  06/23/2021  ? 11:29 AM  ?Advanced Directives  ?Does Patient Have a Medical Advance Directive? Yes  ?Type of Paramedic of [place];Living will  ?Does patient want to make changes to medical advance directive? No - Patient declined  ?Copy of [place] in Chart? Yes - validated most recent copy scanned in chart (See row information)  ? ? ? ?Chief Complaint  ?Patient presents with  ? Medical Management of Chronic Issues  ?  Routine visit.   ? ? ?HPI:  ?Pt is a 81 y.o. female seen today for medical management of chronic diseases.   ? ?Anxiety and depression- 05/03 she was found gagging herself- denied suicidal thoughts- admitted to increased anxiety, hospitalized 11/20-11/23 suicidal ideation, started on xanax prn- behaviors improved, psych consult 05/10 with [facility], remains on Prozac and Zyprexa ?Yellow skin- total bilirubin 0.5, direct bilirubin 0.1, indirect bilirubin 0.4, alkaline phos 67, AST/ALT 17/23 06/01/2021, denies abdominal pain, thought to be related to eating daily sweet potatoes ?Weight loss- BMI 14.85, frail appearance, nursing reports she does not eat or drink well  ?Dementia- MMSE 27/30 04/2021, remains on skilled nursing unit ?HTN- BUN/creat 17/0.57 06/01/2021, off amlodipine ?Osteoporosis- DEXA 2017, t score -3.3 femur, remains on Evenity ?Constipation- LBM 05/01, remains on  senna, colace and miralax ?No recent falls or injuries. Ambulates with walker. Working with PT.  ? ?Recent blood pressures: ? 05/15- 121/55 ? 05/08- 118/72 ? 05/01- 120/77 ? ?Recent weights: ? 05/01- 86.5 lbs ? 04/01- 87.6 lbs ? 03/01- 86.5 lbs  ? ? ?Past Medical History:  ?Diagnosis Date  ? Anxiety   ? no meds  ? Cancer [facility])   ? 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 Depression   ? years ago  ? Headache   ? sinus headaches  ? Lipoma of arm   ? Right  ? Medical history non-contributory   ? Osteoporosis   ? SVD (spontaneous vaginal delivery)   ? x 2  ? Varicose vein   ? Vitamin D deficiency   ? ?Past Surgical History:  ?Procedure Laterality Date  ? ANTERIOR APPROACH HEMI HIP ARTHROPLASTY Right 09/04/2020  ? Procedure: ANTERIOR APPROACH HEMI HIP ARTHROPLASTY;  Surgeon: Rod Can, MD;  Location: [facility];  Service: Orthopedics;  Laterality: Right;  ? APPENDECTOMY  1947  ? CHOLECYSTECTOMY  1988  ? COLONOSCOPY    ? GANGLION CYST EXCISION    ? x2 left and right arm  ? HYSTEROSCOPY WITH D & C N/A 07/15/2013  ? Procedure: DILATATION AND CURETTAGE /HYSTEROSCOPY;  Surgeon: Lovenia Kim, MD;  Location: [facility];  Service: Gynecology;  Laterality: N/A;  ? KYPHOPLASTY Bilateral 05/04/2020  ? Procedure: KYPHOPLASTY THORACIC TWELVE;  Surgeon: Vallarie Mare, MD;  Location: [place];  Service: Neurosurgery;  Laterality: Bilateral;  ? [place]  ? WISDOM TOOTH EXTRACTION    ? ? ?Allergies  ?Allergen Reactions  ? Alendronate Sodium  Other (See Comments)  ?  Leg cramps and "Allergic," per [facility]  ? Risedronate Sodium Other (See Comments)  ?  Caused the patient to be achy and is "Allergic," per [facility]  ? Tramadol Other (See Comments)  ?  Caused the patient to feel badly   ? Keflex [Cephalexin] Other (See Comments)  ?  Possible rash  ? ? ?Outpatient Encounter Medications as of 06/23/2021  ?Medication Sig  ? acetaminophen (TYLENOL) 325 MG tablet Take 650 mg by mouth in the morning and at bedtime.  ? acetaminophen (TYLENOL) 500 MG tablet  Take 1,000 mg by mouth every 8 (eight) hours as needed for moderate pain, mild pain, headache or fever.  ? ASPIRIN 81 PO Take 1 tablet by mouth daily.  ? Calcium Carbonate (CALCIUM-CARB 600 PO) Take 1 tablet by mouth 2 (two) times daily.  ? cetirizine (ZYRTEC ALLERGY) 10 MG tablet Take 1 tablet (10 mg total) by mouth daily.  ? Cholecalciferol (VITAMIN D3) 2000 units capsule Take 1 capsule (2,000 Units total) by mouth daily.  ? Cobalamin Combinations (VITAMIN B12-FOLIC ACID PO) Place 1 lozenge under the tongue in the morning.  ? divalproex (DEPAKOTE SPRINKLE) 125 MG capsule Take 125 mg by mouth 2 (two) times daily.  ? docusate sodium (COLACE) 100 MG capsule Take 100 mg by mouth at bedtime.  ? FLUoxetine (PROZAC) 20 MG capsule Take 1 capsule (20 mg total) by mouth at bedtime.  ? gabapentin (NEURONTIN) 100 MG capsule Take 1 capsule (100 mg total) by mouth 3 (three) times daily.  ? KETOCONAZOLE, TOPICAL, (NIZORAL A-D) 1 % SHAM Apply 1 application. topically once a week. Monday  ? melatonin 5 MG TABS Take 5 mg by mouth at bedtime.  ? Multiple Vitamins-Minerals (PRESERVISION AREDS 2 PO) Take 1 capsule by mouth 2 (two) times daily.  ? OLANZapine (ZYPREXA) 7.5 MG tablet Take 7.5 mg by mouth at bedtime.  ? polyethylene glycol (MIRALAX / GLYCOLAX) 17 g packet Take 17 g by mouth daily.  ? SALONPAS 3.02-13-08 % PTCH Apply 1 patch topically See admin instructions. Apply 1 patch to the lower back and remove (at most) after 8 hours- once a day as needed for pain  ? senna-docusate (SENOKOT-S) 8.6-50 MG tablet Take 2 tablets by mouth daily.  ? sodium phosphate (FLEET) 7-19 GM/118ML ENEM Place 1 enema rectally daily as needed for severe constipation or mild constipation.  ? [DISCONTINUED] ALPRAZolam (XANAX) 0.25 MG tablet Take 1 tablet (0.25 mg total) by mouth daily as needed for up to 14 days for anxiety.  ? [DISCONTINUED] Romosozumab-aqqg (EVENITY) 105 MG/1.17ML SOSY injection Inject 210 mg into the skin once. Once a day on the 5th  of the month  ? ?No facility-administered encounter medications on file as of 06/23/2021.  ? ? ?Review of Systems  ?Constitutional:  Negative for activity change, appetite change, chills, fatigue and fever.  ?HENT:  Negative for congestion and trouble swallowing.   ?Eyes:  Negative for visual disturbance.  ?Respiratory:  Negative for cough, shortness of breath and wheezing.   ?Cardiovascular:  Negative for chest pain and leg swelling.  ?Gastrointestinal:  Negative for abdominal distention, abdominal pain, constipation, diarrhea, nausea and vomiting.  ?Genitourinary:  Negative for dysuria, frequency and hematuria.  ?Musculoskeletal:  Positive for arthralgias, back pain and gait problem.  ?Skin:  Positive for color change.  ?Neurological:  Positive for weakness. Negative for dizziness and headaches.  ?Psychiatric/Behavioral:  Positive for confusion and dysphoric mood. Negative for agitation, sleep disturbance and suicidal ideas. The  patient is nervous/anxious.   ? ?Immunization History  ?Administered Date(s) Administered  ? DT (Pediatric) 04/10/2002  ? Fluad Quad(high Dose 65+) 10/28/2019  ? Influenza Split 03/08/2011, 11/17/2011  ? Influenza Whole 01/09/2007, 11/05/2008, 11/04/2009  ? Influenza, High Dose Seasonal PF 12/07/2015, 09/29/2016, 11/09/2017, 09/27/2018, 10/28/2019  ? Influenza-Unspecified 10/30/2012, 11/29/2013, 12/02/2014, 11/25/2020  ? Moderna Sars-Covid-2 Vaccination 02/08/2019, 03/11/2019  ? PFIZER(Purple Top)SARS-COV-2 Vaccination 10/09/2019  ? PPD Test 12/22/2015, 01/05/2016  ? Pneumococcal Conjugate-13 07/17/2014  ? Pneumococcal Polysaccharide-23 08/15/2005, 09/25/2015  ? Tetanus 12/24/2012  ? Unspecified SARS-COV-2 Vaccination 02/11/2019, 12/17/2019, 10/27/2020  ? Zoster Recombinat (Shingrix) 06/07/2019, 09/06/2019  ? Zoster, Live 12/05/2013  ? ?Pertinent  Health Maintenance Due  ?Topic Date Due  ? INFLUENZA VACCINE  09/07/2021  ? DEXA SCAN  Completed  ? ? ?  01/11/2021  ?  9:00 AM 01/11/2021  ?  8:09  PM 01/12/2021  ?  9:15 AM 01/12/2021  ? 10:01 PM 01/13/2021  ?  8:14 AM  ?Fall Risk  ?Patient Fall Risk Level       ?  ? Information is confidential and restricted. Go to Review Flowsheets to unlock data.  ?

## 2021-06-29 DIAGNOSIS — L570 Actinic keratosis: Secondary | ICD-10-CM | POA: Diagnosis not present

## 2021-06-29 DIAGNOSIS — Z85828 Personal history of other malignant neoplasm of skin: Secondary | ICD-10-CM | POA: Diagnosis not present

## 2021-06-29 DIAGNOSIS — L853 Xerosis cutis: Secondary | ICD-10-CM | POA: Diagnosis not present

## 2021-06-29 DIAGNOSIS — L814 Other melanin hyperpigmentation: Secondary | ICD-10-CM | POA: Diagnosis not present

## 2021-06-29 DIAGNOSIS — L821 Other seborrheic keratosis: Secondary | ICD-10-CM | POA: Diagnosis not present

## 2021-06-30 ENCOUNTER — Ambulatory Visit (INDEPENDENT_AMBULATORY_CARE_PROVIDER_SITE_OTHER): Payer: Medicare Other | Admitting: Internal Medicine

## 2021-06-30 ENCOUNTER — Encounter: Payer: Self-pay | Admitting: Internal Medicine

## 2021-06-30 VITALS — BP 116/62 | HR 76 | Temp 98.2°F | Ht 64.0 in | Wt 87.0 lb

## 2021-06-30 DIAGNOSIS — S22000A Wedge compression fracture of unspecified thoracic vertebra, initial encounter for closed fracture: Secondary | ICD-10-CM

## 2021-06-30 DIAGNOSIS — F0280 Dementia in other diseases classified elsewhere without behavioral disturbance: Secondary | ICD-10-CM

## 2021-06-30 DIAGNOSIS — D649 Anemia, unspecified: Secondary | ICD-10-CM

## 2021-06-30 DIAGNOSIS — M545 Low back pain, unspecified: Secondary | ICD-10-CM | POA: Diagnosis not present

## 2021-06-30 DIAGNOSIS — G309 Alzheimer's disease, unspecified: Secondary | ICD-10-CM

## 2021-06-30 DIAGNOSIS — R202 Paresthesia of skin: Secondary | ICD-10-CM | POA: Diagnosis not present

## 2021-06-30 DIAGNOSIS — E559 Vitamin D deficiency, unspecified: Secondary | ICD-10-CM | POA: Diagnosis not present

## 2021-06-30 DIAGNOSIS — F4323 Adjustment disorder with mixed anxiety and depressed mood: Secondary | ICD-10-CM | POA: Diagnosis not present

## 2021-06-30 LAB — VITAMIN D 25 HYDROXY (VIT D DEFICIENCY, FRACTURES): VITD: 56.33 ng/mL (ref 30.00–100.00)

## 2021-06-30 LAB — CBC WITH DIFFERENTIAL/PLATELET
Basophils Absolute: 0 10*3/uL (ref 0.0–0.1)
Basophils Relative: 0.7 % (ref 0.0–3.0)
Eosinophils Absolute: 0.1 10*3/uL (ref 0.0–0.7)
Eosinophils Relative: 2.2 % (ref 0.0–5.0)
HCT: 40.8 % (ref 36.0–46.0)
Hemoglobin: 13.3 g/dL (ref 12.0–15.0)
Lymphocytes Relative: 23 % (ref 12.0–46.0)
Lymphs Abs: 1.5 10*3/uL (ref 0.7–4.0)
MCHC: 32.5 g/dL (ref 30.0–36.0)
MCV: 92.8 fl (ref 78.0–100.0)
Monocytes Absolute: 0.8 10*3/uL (ref 0.1–1.0)
Monocytes Relative: 11.8 % (ref 3.0–12.0)
Neutro Abs: 4.1 10*3/uL (ref 1.4–7.7)
Neutrophils Relative %: 62.3 % (ref 43.0–77.0)
Platelets: 199 10*3/uL (ref 150.0–400.0)
RBC: 4.4 Mil/uL (ref 3.87–5.11)
RDW: 14.3 % (ref 11.5–15.5)
WBC: 6.5 10*3/uL (ref 4.0–10.5)

## 2021-06-30 LAB — COMPREHENSIVE METABOLIC PANEL
ALT: 20 U/L (ref 0–35)
AST: 25 U/L (ref 0–37)
Albumin: 4.3 g/dL (ref 3.5–5.2)
Alkaline Phosphatase: 62 U/L (ref 39–117)
BUN: 23 mg/dL (ref 6–23)
CO2: 31 mEq/L (ref 19–32)
Calcium: 10.1 mg/dL (ref 8.4–10.5)
Chloride: 98 mEq/L (ref 96–112)
Creatinine, Ser: 0.68 mg/dL (ref 0.40–1.20)
GFR: 80.99 mL/min (ref >60.00)
Glucose, Bld: 91 mg/dL (ref 70–99)
Potassium: 4.4 mEq/L (ref 3.5–5.1)
Sodium: 137 mEq/L (ref 135–145)
Total Bilirubin: 0.4 mg/dL (ref 0.2–1.2)
Total Protein: 7.6 g/dL (ref 6.0–8.3)

## 2021-06-30 LAB — VITAMIN B12: Vitamin B-12: >1504 pg/mL — ABNORMAL HIGH (ref 211–911)

## 2021-06-30 LAB — TSH: TSH: 2.25 u[IU]/mL (ref 0.35–5.50)

## 2021-06-30 LAB — T4, FREE: Free T4: 0.91 ng/dL (ref 0.60–1.60)

## 2021-06-30 NOTE — Assessment & Plan Note (Addendum)
In my opinion, Tanya Walker can try to move in w/[name] for a week or so to see how she does.  We will need to run it by [name]'s attending

## 2021-06-30 NOTE — Patient Instructions (Signed)
Use Blue-Emu cream 2 times a day

## 2021-06-30 NOTE — Assessment & Plan Note (Signed)
On Vit D 

## 2021-06-30 NOTE — Assessment & Plan Note (Signed)
Blue-Emu cream was recommended to use 2 times a day

## 2021-06-30 NOTE — Progress Notes (Signed)
Subjective:  Patient ID: Tanya Walker, female    DOB: 08/13/1940  Age: 81 y.o. MRN: [id number]  CC: Follow-up   HPI YARAH FUENTE presents for dementia w/agitation.  She is residing in the memory care unit.  She is seen at the request of Barnabas Lister, her husband.  She is wondering if she can move back in to live with Barnabas Lister.  Sashia has been spending time in [place] apartment.  Barnabas Lister is helping with history C/o LBP.  Follow-up on dementia and osteoporosis  Outpatient Medications Prior to Visit  Medication Sig Dispense Refill   acetaminophen (TYLENOL) 325 MG tablet Take 650 mg by mouth in the morning and at bedtime.     acetaminophen (TYLENOL) 500 MG tablet Take 1,000 mg by mouth every 8 (eight) hours as needed for moderate pain, mild pain, headache or fever.     ASPIRIN 81 PO Take 1 tablet by mouth daily.     Calcium Carbonate (CALCIUM-CARB 600 PO) Take 1 tablet by mouth 2 (two) times daily.     cetirizine (ZYRTEC ALLERGY) 10 MG tablet Take 1 tablet (10 mg total) by mouth daily. 30 tablet 11   Cholecalciferol (VITAMIN D3) 2000 units capsule Take 1 capsule (2,000 Units total) by mouth daily. 100 capsule 3   Cobalamin Combinations (VITAMIN B12-FOLIC ACID PO) Place 1 lozenge under the tongue in the morning.     divalproex (DEPAKOTE SPRINKLE) 125 MG capsule Take 125 mg by mouth 2 (two) times daily.     docusate sodium (COLACE) 100 MG capsule Take 100 mg by mouth at bedtime.     FLUoxetine (PROZAC) 20 MG capsule Take 1 capsule (20 mg total) by mouth at bedtime. 30 capsule 3   gabapentin (NEURONTIN) 100 MG capsule Take 1 capsule (100 mg total) by mouth 3 (three) times daily. 90 capsule 3   KETOCONAZOLE, TOPICAL, (NIZORAL A-D) 1 % SHAM Apply 1 application. topically once a week. Monday     melatonin 5 MG TABS Take 5 mg by mouth at bedtime.     Multiple Vitamins-Minerals (PRESERVISION AREDS 2 PO) Take 1 capsule by mouth 2 (two) times daily.     OLANZapine (ZYPREXA) 7.5 MG tablet Take 7.5 mg by mouth at  bedtime.     polyethylene glycol (MIRALAX / GLYCOLAX) 17 g packet Take 17 g by mouth daily.     senna-docusate (SENOKOT-S) 8.6-50 MG tablet Take 2 tablets by mouth daily.     sodium phosphate (FLEET) 7-19 GM/118ML ENEM Place 1 enema rectally daily as needed for severe constipation or mild constipation.     SALONPAS 3.02-13-08 % PTCH Apply 1 patch topically See admin instructions. Apply 1 patch to the lower back and remove (at most) after 8 hours- once a day as needed for pain     No facility-administered medications prior to visit.    ROS: Review of Systems  Constitutional:  Negative for activity change, appetite change, chills, fatigue and unexpected weight change.  HENT:  Negative for congestion, mouth sores and sinus pressure.   Eyes:  Negative for visual disturbance.  Respiratory:  Negative for cough and chest tightness.   Gastrointestinal:  Negative for abdominal pain and nausea.  Genitourinary:  Negative for difficulty urinating, frequency and vaginal pain.  Musculoskeletal:  Positive for back pain. Negative for gait problem.  Skin:  Negative for pallor and rash.  Neurological:  Negative for dizziness, tremors, weakness, numbness and headaches.  Psychiatric/Behavioral:  Positive for confusion. Negative for sleep disturbance and suicidal  ideas.    Objective:  BP 116/62 (BP Location: Left Arm, Patient Position: Sitting, Cuff Size: Normal)   Pulse 76   Temp 98.2 F (36.8 C) (Oral)   Ht '5\' 4"'$  (1.626 m)   Wt 87 lb (39.5 kg)   SpO2 96%   BMI 14.93 kg/m   BP Readings from Last 3 Encounters:  06/30/21 116/62  06/23/21 (!) 121/55  06/10/21 120/77    Wt Readings from Last 3 Encounters:  06/30/21 87 lb (39.5 kg)  06/23/21 86 lb 8 oz (39.2 kg)  06/10/21 86 lb 8 oz (39.2 kg)    Physical Exam Constitutional:      General: She is not in acute distress.    Appearance: She is well-developed.  HENT:     Head: Normocephalic.     Right Ear: External ear normal.     Left Ear:  External ear normal.     Nose: Nose normal.  Eyes:     General:        Right eye: No discharge.        Left eye: No discharge.     Conjunctiva/sclera: Conjunctivae normal.     Pupils: Pupils are equal, round, and reactive to light.  Neck:     Thyroid: No thyromegaly.     Vascular: No JVD.     Trachea: No tracheal deviation.  Cardiovascular:     Rate and Rhythm: Normal rate and regular rhythm.     Heart sounds: Normal heart sounds.  Pulmonary:     Effort: No respiratory distress.     Breath sounds: No stridor. No wheezing.  Abdominal:     General: Bowel sounds are normal. There is no distension.     Palpations: Abdomen is soft. There is no mass.     Tenderness: There is no abdominal tenderness. There is no guarding or rebound.  Musculoskeletal:        General: No tenderness.     Cervical back: Normal range of motion and neck supple. No rigidity.  Lymphadenopathy:     Cervical: No cervical adenopathy.  Skin:    Findings: No erythema or rash.  Neurological:     Mental Status: She is disoriented.     Cranial Nerves: No cranial nerve deficit.     Motor: No abnormal muscle tone.     Coordination: Coordination normal.     Gait: Gait normal.     Deep Tendon Reflexes: Reflexes normal.  Psychiatric:        Behavior: Behavior normal.     A total time of 45 minutes was spent preparing to see the patient, reviewing tests, x-rays, operative reports and other medical records.  Also, obtaining history and performing comprehensive physical exam.  Additionally, counseling the patient and her husband regarding the above listed issues.   Finally, documenting clinical information in the health records, coordination of care, educating the patient and her husband regarding dementia with agitation, osteoporosis, risk of falls. It is a complex case.  Lab Results  Component Value Date   WBC 6.5 06/30/2021   HGB 13.3 06/30/2021   HCT 40.8 06/30/2021   PLT 199.0 06/30/2021   GLUCOSE 91 06/30/2021    CHOL 182 01/06/2021   TRIG 37 01/06/2021   HDL 58 01/06/2021   LDLDIRECT 135.4 12/26/2008   LDLCALC 117 (H) 01/06/2021   ALT 20 06/30/2021   AST 25 06/30/2021   NA 137 06/30/2021   K 4.4 06/30/2021   CL 98 06/30/2021   CREATININE 0.68  06/30/2021   BUN 23 06/30/2021   CO2 31 06/30/2021   TSH 2.25 06/30/2021   INR 1.0 08/19/2020   HGBA1C 5.5 01/06/2021    No results found.  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  We will need to run it by Laurali's attending       Relevant Orders   CBC with Differential/Platelet (Completed)   Comprehensive metabolic panel (Completed)   Iron, TIBC and Ferritin Panel   TSH (Completed)   T4, free (Completed)   Vitamin B12 (Completed)   VITAMIN D 25 Hydroxy (Vit-D Deficiency, Fractures) (Completed)   Zinc   Compression fracture of body of thoracic vertebra (HCC)    Blue-Emu cream was recommended to use 2 times a day       Low back pain    Blue-Emu cream was recommended to use 2 times a day        Vitamin D deficiency    On Vit D       Relevant Orders   VITAMIN D 25 Hydroxy (Vit-D Deficiency, Fractures) (Completed)   Other Visit Diagnoses     Paresthesia    -  Primary   Relevant Orders   Vitamin B12 (Completed)   Anemia, unspecified type       Relevant Orders   CBC with Differential/Platelet (Completed)   Iron, TIBC and Ferritin Panel         No orders of the defined types were placed in this encounter.     Follow-up: Return in about 6  weeks (around 08/11/2021) for a follow-up visit.  Walker Kehr, MD

## 2021-07-05 DIAGNOSIS — F03A3 Unspecified dementia, mild, with mood disturbance: Secondary | ICD-10-CM | POA: Diagnosis not present

## 2021-07-05 DIAGNOSIS — F332 Major depressive disorder, recurrent severe without psychotic features: Secondary | ICD-10-CM | POA: Diagnosis not present

## 2021-07-05 DIAGNOSIS — F411 Generalized anxiety disorder: Secondary | ICD-10-CM | POA: Diagnosis not present

## 2021-07-05 DIAGNOSIS — G4700 Insomnia, unspecified: Secondary | ICD-10-CM | POA: Diagnosis not present

## 2021-07-07 LAB — IRON,TIBC AND FERRITIN PANEL
%SAT: 19 % (calc) (ref 16–45)
Ferritin: 72 ng/mL (ref 16–288)
Iron: 58 ug/dL (ref 45–160)
TIBC: 313 mcg/dL (calc) (ref 250–450)

## 2021-07-07 LAB — ZINC: Zinc: 98 ug/dL (ref 60–130)

## 2021-07-22 DIAGNOSIS — R627 Adult failure to thrive: Secondary | ICD-10-CM | POA: Diagnosis not present

## 2021-07-22 DIAGNOSIS — I10 Essential (primary) hypertension: Secondary | ICD-10-CM | POA: Diagnosis not present

## 2021-07-22 LAB — CBC AND DIFFERENTIAL
HCT: 38 (ref 36–46)
Hemoglobin: 12.5 (ref 12.0–16.0)
Platelets: 206 10*3/uL (ref 150–400)
WBC: 4.6

## 2021-07-22 LAB — BASIC METABOLIC PANEL
BUN: 17 (ref 4–21)
CO2: 32 — AB (ref 13–22)
Chloride: 101 (ref 99–108)
Creatinine: 0.7 (ref 0.5–1.1)
Glucose: 88
Potassium: 3.9 mEq/L (ref 3.5–5.1)
Sodium: 138 (ref 137–147)

## 2021-07-22 LAB — COMPREHENSIVE METABOLIC PANEL
Albumin: 3.7 (ref 3.5–5.0)
Calcium: 8.8 (ref 8.7–10.7)
Globulin: 2.6

## 2021-07-22 LAB — HEPATIC FUNCTION PANEL
ALT: 16 U/L (ref 7–35)
AST: 21 (ref 13–35)
Bilirubin, Direct: 0.1 (ref 0.01–0.4)
Bilirubin, Total: 0.5

## 2021-07-22 LAB — CBC: RBC: 4.09 (ref 3.87–5.11)

## 2021-07-27 DIAGNOSIS — F411 Generalized anxiety disorder: Secondary | ICD-10-CM | POA: Diagnosis not present

## 2021-07-27 DIAGNOSIS — F03A3 Unspecified dementia, mild, with mood disturbance: Secondary | ICD-10-CM | POA: Diagnosis not present

## 2021-07-27 DIAGNOSIS — G4700 Insomnia, unspecified: Secondary | ICD-10-CM | POA: Diagnosis not present

## 2021-07-27 DIAGNOSIS — F332 Major depressive disorder, recurrent severe without psychotic features: Secondary | ICD-10-CM | POA: Diagnosis not present

## 2021-08-11 DIAGNOSIS — H0100A Unspecified blepharitis right eye, upper and lower eyelids: Secondary | ICD-10-CM | POA: Diagnosis not present

## 2021-08-11 DIAGNOSIS — H5203 Hypermetropia, bilateral: Secondary | ICD-10-CM | POA: Diagnosis not present

## 2021-08-11 DIAGNOSIS — H2513 Age-related nuclear cataract, bilateral: Secondary | ICD-10-CM | POA: Diagnosis not present

## 2021-08-11 DIAGNOSIS — H353131 Nonexudative age-related macular degeneration, bilateral, early dry stage: Secondary | ICD-10-CM | POA: Diagnosis not present

## 2021-08-12 ENCOUNTER — Ambulatory Visit (INDEPENDENT_AMBULATORY_CARE_PROVIDER_SITE_OTHER): Payer: Medicare Other | Admitting: Internal Medicine

## 2021-08-12 ENCOUNTER — Encounter: Payer: Self-pay | Admitting: Internal Medicine

## 2021-08-12 ENCOUNTER — Ambulatory Visit (INDEPENDENT_AMBULATORY_CARE_PROVIDER_SITE_OTHER): Payer: Medicare Other

## 2021-08-12 DIAGNOSIS — M546 Pain in thoracic spine: Secondary | ICD-10-CM | POA: Diagnosis not present

## 2021-08-12 DIAGNOSIS — S22000A Wedge compression fracture of unspecified thoracic vertebra, initial encounter for closed fracture: Secondary | ICD-10-CM | POA: Diagnosis not present

## 2021-08-12 DIAGNOSIS — M545 Low back pain, unspecified: Secondary | ICD-10-CM

## 2021-08-12 DIAGNOSIS — M47812 Spondylosis without myelopathy or radiculopathy, cervical region: Secondary | ICD-10-CM | POA: Diagnosis not present

## 2021-08-12 DIAGNOSIS — E559 Vitamin D deficiency, unspecified: Secondary | ICD-10-CM

## 2021-08-12 DIAGNOSIS — M810 Age-related osteoporosis without current pathological fracture: Secondary | ICD-10-CM

## 2021-08-12 NOTE — Assessment & Plan Note (Addendum)
LS and thor spine X ray Blue-Emu cream was recommended to use 2-3 times a day F/u w/Dr Lyndel Safe to discuss other Rx

## 2021-08-12 NOTE — Assessment & Plan Note (Signed)
F/u w/Dr Lyndel Safe to discuss Rx

## 2021-08-12 NOTE — Patient Instructions (Signed)
Blue-Emu cream -- use 2-3 times a day ? ?

## 2021-08-12 NOTE — Assessment & Plan Note (Signed)
On Vit D 

## 2021-08-12 NOTE — Progress Notes (Signed)
Subjective:  Patient ID: Tanya Walker, female    DOB: Feb 14, 1940  Age: 81 y.o. MRN: [id number]  CC: No chief complaint on file.   HPI Tanya Walker presents for a f/u. C/o dementia w/agitation.  She is still residing in the memory care unit.  She is seen at the request of Barnabas Lister, her husband.  She is wondering again if she can move back in to live with Barnabas Lister.  Breta has been spending time in [place] apartment most of the afternoon.  Barnabas Lister is giving her history C/o LBP - worse.  Follow-up on dementia and osteoporosis  Outpatient Medications Prior to Visit  Medication Sig Dispense Refill   acetaminophen (TYLENOL) 325 MG tablet Take 650 mg by mouth in the morning and at bedtime.     acetaminophen (TYLENOL) 500 MG tablet Take 1,000 mg by mouth every 8 (eight) hours as needed for moderate pain, mild pain, headache or fever.     ASPIRIN 81 PO Take 1 tablet by mouth daily.     Calcium Carbonate (CALCIUM-CARB 600 PO) Take 1 tablet by mouth 2 (two) times daily.     cetirizine (ZYRTEC ALLERGY) 10 MG tablet Take 1 tablet (10 mg total) by mouth daily. 30 tablet 11   Cholecalciferol (VITAMIN D3) 2000 units capsule Take 1 capsule (2,000 Units total) by mouth daily. 100 capsule 3   Cobalamin Combinations (VITAMIN B12-FOLIC ACID PO) Place 1 lozenge under the tongue in the morning.     divalproex (DEPAKOTE SPRINKLE) 125 MG capsule Take 125 mg by mouth 2 (two) times daily.     docusate sodium (COLACE) 100 MG capsule Take 100 mg by mouth at bedtime.     FLUoxetine (PROZAC) 20 MG capsule Take 1 capsule (20 mg total) by mouth at bedtime. 30 capsule 3   gabapentin (NEURONTIN) 100 MG capsule Take 1 capsule (100 mg total) by mouth 3 (three) times daily. 90 capsule 3   KETOCONAZOLE, TOPICAL, (NIZORAL A-D) 1 % SHAM Apply 1 application. topically once a week. Monday     melatonin 5 MG TABS Take 5 mg by mouth at bedtime.     Multiple Vitamins-Minerals (PRESERVISION AREDS 2 PO) Take 1 capsule by mouth 2 (two) times daily.      OLANZapine (ZYPREXA) 7.5 MG tablet Take 7.5 mg by mouth at bedtime.     polyethylene glycol (MIRALAX / GLYCOLAX) 17 g packet Take 17 g by mouth daily.     senna-docusate (SENOKOT-S) 8.6-50 MG tablet Take 2 tablets by mouth daily.     sodium phosphate (FLEET) 7-19 GM/118ML ENEM Place 1 enema rectally daily as needed for severe constipation or mild constipation.     No facility-administered medications prior to visit.    ROS: Review of Systems  Constitutional:  Positive for fatigue and unexpected weight change. Negative for activity change, appetite change and chills.  HENT:  Negative for congestion, mouth sores and sinus pressure.   Eyes:  Negative for visual disturbance.  Respiratory:  Negative for cough and chest tightness.   Gastrointestinal:  Negative for abdominal pain and nausea.  Genitourinary:  Negative for difficulty urinating, frequency and vaginal pain.  Musculoskeletal:  Positive for arthralgias, back pain and gait problem.  Skin:  Negative for pallor and rash.  Neurological:  Negative for dizziness, tremors, weakness, numbness and headaches.  Psychiatric/Behavioral:  Positive for behavioral problems, confusion, decreased concentration and dysphoric mood. Negative for sleep disturbance and suicidal ideas. The patient is nervous/anxious.   Using a cane  Objective:  BP 94/70 (BP Location: Left Arm, Patient Position: Sitting, Cuff Size: Large)   Pulse 76   Temp 97.7 F (36.5 C) (Oral)   Ht '5\' 4"'$  (1.626 m)   Wt 83 lb (37.6 kg)   SpO2 94%   BMI 14.25 kg/m   BP Readings from Last 3 Encounters:  08/12/21 94/70  06/30/21 116/62  06/23/21 (!) 121/55    Wt Readings from Last 3 Encounters:  08/12/21 83 lb (37.6 kg)  06/30/21 87 lb (39.5 kg)  06/23/21 86 lb 8 oz (39.2 kg)    Physical Exam Constitutional:      General: She is not in acute distress.    Appearance: Normal appearance. She is well-developed.  HENT:     Head: Normocephalic.     Right Ear: External ear  normal.     Left Ear: External ear normal.     Nose: Nose normal.  Eyes:     General:        Right eye: No discharge.        Left eye: No discharge.     Conjunctiva/sclera: Conjunctivae normal.     Pupils: Pupils are equal, round, and reactive to light.  Neck:     Thyroid: No thyromegaly.     Vascular: No JVD.     Trachea: No tracheal deviation.  Cardiovascular:     Rate and Rhythm: Normal rate and regular rhythm.     Heart sounds: Normal heart sounds.  Pulmonary:     Effort: No respiratory distress.     Breath sounds: No stridor. No wheezing.  Abdominal:     General: Bowel sounds are normal. There is no distension.     Palpations: Abdomen is soft. There is no mass.     Tenderness: There is no abdominal tenderness. There is no guarding or rebound.  Musculoskeletal:        General: No tenderness.     Cervical back: Normal range of motion and neck supple. No rigidity.  Lymphadenopathy:     Cervical: No cervical adenopathy.  Skin:    Findings: No erythema or rash.  Neurological:     Mental Status: She is disoriented.     Cranial Nerves: No cranial nerve deficit.     Motor: Weakness present. No abnormal muscle tone.     Coordination: Coordination abnormal.     Gait: Gait abnormal.     Deep Tendon Reflexes: Reflexes normal.  Psychiatric:        Behavior: Behavior normal.        Thought Content: Thought content normal.        Judgment: Judgment normal.   Thin LS w/pain Antalgic gait w/a cane  "October 05, 2021"  Lab Results  Component Value Date   WBC 6.5 06/30/2021   HGB 13.3 06/30/2021   HCT 40.8 06/30/2021   PLT 199.0 06/30/2021   GLUCOSE 91 06/30/2021   CHOL 182 01/06/2021   TRIG 37 01/06/2021   HDL 58 01/06/2021   LDLDIRECT 135.4 12/26/2008   LDLCALC 117 (H) 01/06/2021   ALT 20 06/30/2021   AST 25 06/30/2021   NA 137 06/30/2021   K 4.4 06/30/2021   CL 98 06/30/2021   CREATININE 0.68 06/30/2021   BUN 23 06/30/2021   CO2 31 06/30/2021   TSH 2.25  06/30/2021   INR 1.0 08/19/2020   HGBA1C 5.5 01/06/2021    No results found.  Assessment & Plan:   Problem List Items Addressed This Visit  Compression fracture of body of thoracic vertebra ([place])    F/u w/Dr Lyndel Safe to discuss Rx      Relevant Orders   DG Thoracic Spine 2 View   Low back pain    LS and thor spine X ray Blue-Emu cream was recommended to use 2-3 times a day F/u w/Dr Lyndel Safe to discuss other Rx      Relevant Orders   DG Lumbar Spine 2-3 Views   DG Thoracic Spine 2 View   Osteoporosis    F/u w/Dr Lyndel Safe to discuss Rx      Vitamin D deficiency    On Vit D         No orders of the defined types were placed in this encounter.     Follow-up: No follow-ups on file.  Walker Kehr, MD

## 2021-08-31 DIAGNOSIS — L570 Actinic keratosis: Secondary | ICD-10-CM | POA: Diagnosis not present

## 2021-08-31 DIAGNOSIS — L821 Other seborrheic keratosis: Secondary | ICD-10-CM | POA: Diagnosis not present

## 2021-08-31 DIAGNOSIS — L853 Xerosis cutis: Secondary | ICD-10-CM | POA: Diagnosis not present

## 2021-08-31 DIAGNOSIS — Z85828 Personal history of other malignant neoplasm of skin: Secondary | ICD-10-CM | POA: Diagnosis not present

## 2021-09-03 ENCOUNTER — Non-Acute Institutional Stay (SKILLED_NURSING_FACILITY): Payer: Medicare Other | Admitting: Nurse Practitioner

## 2021-09-03 ENCOUNTER — Encounter: Payer: Self-pay | Admitting: Nurse Practitioner

## 2021-09-03 DIAGNOSIS — D696 Thrombocytopenia, unspecified: Secondary | ICD-10-CM

## 2021-09-03 DIAGNOSIS — H1012 Acute atopic conjunctivitis, left eye: Secondary | ICD-10-CM | POA: Diagnosis not present

## 2021-09-03 DIAGNOSIS — G309 Alzheimer's disease, unspecified: Secondary | ICD-10-CM

## 2021-09-03 DIAGNOSIS — F0280 Dementia in other diseases classified elsewhere without behavioral disturbance: Secondary | ICD-10-CM

## 2021-09-03 DIAGNOSIS — E538 Deficiency of other specified B group vitamins: Secondary | ICD-10-CM

## 2021-09-03 DIAGNOSIS — R627 Adult failure to thrive: Secondary | ICD-10-CM | POA: Diagnosis not present

## 2021-09-03 DIAGNOSIS — M545 Low back pain, unspecified: Secondary | ICD-10-CM

## 2021-09-03 DIAGNOSIS — K5901 Slow transit constipation: Secondary | ICD-10-CM | POA: Diagnosis not present

## 2021-09-03 DIAGNOSIS — J3089 Other allergic rhinitis: Secondary | ICD-10-CM

## 2021-09-03 DIAGNOSIS — E785 Hyperlipidemia, unspecified: Secondary | ICD-10-CM

## 2021-09-03 DIAGNOSIS — H109 Unspecified conjunctivitis: Secondary | ICD-10-CM | POA: Insufficient documentation

## 2021-09-03 DIAGNOSIS — F32A Depression, unspecified: Secondary | ICD-10-CM

## 2021-09-03 DIAGNOSIS — I10 Essential (primary) hypertension: Secondary | ICD-10-CM

## 2021-09-03 DIAGNOSIS — E559 Vitamin D deficiency, unspecified: Secondary | ICD-10-CM

## 2021-09-03 DIAGNOSIS — R45851 Suicidal ideations: Secondary | ICD-10-CM

## 2021-09-03 DIAGNOSIS — D500 Iron deficiency anemia secondary to blood loss (chronic): Secondary | ICD-10-CM

## 2021-09-03 DIAGNOSIS — M810 Age-related osteoporosis without current pathological fracture: Secondary | ICD-10-CM

## 2021-09-03 NOTE — Assessment & Plan Note (Signed)
Blood pressure is controlled,  off Amlodipine. Bun/creat 23/0.68 06/30/21

## 2021-09-03 NOTE — Assessment & Plan Note (Signed)
plt 199 06/30/21

## 2021-09-03 NOTE — Assessment & Plan Note (Signed)
Resolved, Hgb Iron 58, Hgb 13.3 06/30/21

## 2021-09-03 NOTE — Assessment & Plan Note (Signed)
Left eye redness, itching/burning sensation, denied eye pain or change of vision, will warm compress 60mn/eah qid x3 days, Naphcon A I gtt 4x/day x 5 days.

## 2021-09-03 NOTE — Assessment & Plan Note (Signed)
TSH 2.25, Vit B12 >1504 06/30/21,  SNF [facility] for supportive care. MMSE 27/30 04/08/21 

## 2021-09-03 NOTE — Assessment & Plan Note (Signed)
hx of suicidal attempt/ideations, anxious at times,  on Prozac, Zyprexa, Depakote, Mirtazapine made her AMS 

## 2021-09-03 NOTE — Assessment & Plan Note (Signed)
Vit B12 level >1504 06/30/21, will decrease Vit B12/Folic Lozenge SL qod, repeat Vit B12 level 3 months.

## 2021-09-03 NOTE — Progress Notes (Signed)
This encounter was created in error - please disregard.

## 2021-09-03 NOTE — Assessment & Plan Note (Signed)
Treated with Evenity Vit D, Ca

## 2021-09-03 NOTE — Assessment & Plan Note (Signed)
diet, LDL 117 01/06/21 

## 2021-09-03 NOTE — Assessment & Plan Note (Signed)
weight is stable, didn't tolerate Mirtazapine.

## 2021-09-03 NOTE — Assessment & Plan Note (Signed)
takes Vit d daily, Vit D 56.33 06/30/21 

## 2021-09-03 NOTE — Progress Notes (Signed)
Location:  [facility] Room Number: NO/30/A Place of Service:  SNF (31) Provider:  Izel Hochberg X, NP Virgie Dad, MD  Patient Care Team: Virgie Dad, MD as PCP - General (Internal Medicine)  Extended Emergency Contact Information Primary Emergency Contact: Burman Blacksmith Address: [address]          [place], [place] [address] Johnnette Litter of [place] Phone: [phone] Relation: Spouse Secondary Emergency Contact: [facility] Phone: [phone] Relation: Daughter  Code Status:  FULL Goals of care: Advanced Directive information    09/03/2021    1:17 PM  Advanced Directives  Does Patient Have a Medical Advance Directive? Yes  Type of Advance Directive Living will;Healthcare Power of Attorney  Does patient want to make changes to medical advance directive? No - Patient declined  Copy of [place] in Chart? Yes - validated most recent copy scanned in chart (See row information)     Chief Complaint  Patient presents with   Acute Visit    Patient is being seen for red itchy eyes    HPI:  Pt is a 81 y.o. female seen today for an acute visit for left eye redness, c/o itching and burning, denied pain or vision changes.   OP, treated with Evenity, Vit D, Ca.              Anemia, Hgb Iron 58, Hgb 13.3 06/30/21             HTN, off Amlodipine. Bun/creat 23/0.68 06/30/21             Constipation, takes Senokot S, MiraLax.              Alzheimer's Dementia, TSH 2.25, Vit B12 >1504 06/30/21,  SNF [facility] for supportive care. MMSE 27/30 04/08/21             Vit B12 deficiency, Vit B12 level >1504 06/30/21, takes Vit B12             Vitamin D deficiency, takes Vit d daily, Vit D 56.33 06/30/21             Allergic rhinitis, takes Zyrtec.             Hyperlipidemia, diet, LDL 117 01/06/21             Depression, hx of suicidal attempt/ideations, anxious at times,  on Prozac, Zyprexa, Depakote, Mirtazapine made her AMS              Chronic back pain, stable, T11, L1 compression fx, takes Tylenol, Gabapentin             Weight loss/adult failure to thrive, weight is stable, didn't tolerate Mirtazapine.             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   Depression    years ago   Headache    sinus headaches   Lipoma of arm    Right   Medical history non-contributory    Osteoporosis    SVD (spontaneous vaginal delivery)    x 2   Varicose vein    Vitamin D deficiency    Past Surgical History:  Procedure Laterality Date   ANTERIOR APPROACH HEMI HIP ARTHROPLASTY Right 09/04/2020   Procedure: ANTERIOR APPROACH HEMI HIP ARTHROPLASTY;  Surgeon: Rod Can, MD;  Location: [facility];  Service: Orthopedics;  Laterality: Right;   [place]   COLONOSCOPY     GANGLION CYST EXCISION     x2 left and right arm   HYSTEROSCOPY WITH D & C N/A 07/15/2013   Procedure: DILATATION AND CURETTAGE /HYSTEROSCOPY;  Surgeon: Lovenia Kim, MD;  Location: [facility];  Service: Gynecology;  Laterality: N/A;   KYPHOPLASTY Bilateral 05/04/2020   Procedure: KYPHOPLASTY THORACIC TWELVE;  Surgeon: Vallarie Mare, MD;  Location: [facility];  Service: Neurosurgery;  Laterality: Bilateral;   MANDIBLE SURGERY  1954   WISDOM TOOTH EXTRACTION      Allergies  Allergen Reactions   Alendronate Sodium Other (See Comments)    Leg cramps and "Allergic," per MAR   Risedronate Sodium Other (See Comments)    Caused the patient to be achy and is "Allergic," per MAR   Tramadol Other (See Comments)    Caused the patient to feel badly    Keflex [Cephalexin] Other (See Comments)    Possible rash    Outpatient Encounter Medications as of 09/03/2021  Medication Sig   acetaminophen (TYLENOL) 325 MG tablet Take 650 mg by mouth in the morning and at bedtime.   acetaminophen (TYLENOL) 500 MG tablet Take 1,000 mg by mouth  every 8 (eight) hours as needed for moderate pain, mild pain, headache or fever.   ASPIRIN 81 PO Take 1 tablet by mouth daily.   Calcium Carbonate (CALCIUM-CARB 600 PO) Take 1 tablet by mouth 2 (two) times daily.   cetirizine (ZYRTEC ALLERGY) 10 MG tablet Take 1 tablet (10 mg total) by mouth daily.   Cholecalciferol (VITAMIN D3) 2000 units capsule Take 1 capsule (2,000 Units total) by mouth daily.   Cobalamin Combinations (VITAMIN B12-FOLIC ACID PO) Place 1 lozenge under the tongue in the morning.   divalproex (DEPAKOTE SPRINKLE) 125 MG capsule Take 125 mg by mouth 2 (two) times daily.   docusate sodium (COLACE) 100 MG capsule Take 100 mg by mouth at bedtime.   FLUoxetine (PROZAC) 20 MG capsule Take 1 capsule (20 mg total) by mouth at bedtime.   gabapentin (NEURONTIN) 100 MG capsule Take 1 capsule (100 mg total) by mouth 3 (three) times daily.   KETOCONAZOLE, TOPICAL, (NIZORAL A-D) 1 % SHAM Apply 1 application. topically once a week. Monday   melatonin 5 MG TABS Take 5 mg by mouth at bedtime.   Multiple Vitamins-Minerals (PRESERVISION AREDS 2 PO) Take 1 capsule by mouth 2 (two) times daily.   OLANZapine (ZYPREXA) 7.5 MG tablet Take 7.5 mg by mouth at bedtime.   polyethylene glycol (MIRALAX / GLYCOLAX) 17 g packet Take 17 g by mouth daily.   senna-docusate (SENOKOT-S) 8.6-50 MG tablet Take 2 tablets by mouth daily.   sodium phosphate (FLEET) 7-19 GM/118ML ENEM Place 1 enema rectally daily as needed for severe constipation or mild constipation.   No facility-administered encounter medications on file as of 09/03/2021.    Review of Systems  Constitutional:  Negative for activity change, appetite change and fever.  HENT:  Positive for hearing loss. Negative for congestion and voice change.   Eyes:  Positive for discharge, redness and itching. Negative for pain and visual disturbance.       Left eye  Respiratory:  Negative for cough and shortness of breath.   Cardiovascular:  Negative for leg  swelling.  Gastrointestinal:  Negative for abdominal pain, constipation, nausea and vomiting.  Genitourinary:  Negative for dysuria and urgency.  Musculoskeletal:  Positive for arthralgias, back pain and gait problem.  Skin:  Negative for color change.  Neurological:  Negative for speech difficulty, weakness and headaches.       Dementia.   Psychiatric/Behavioral:  Positive for dysphoric mood and sleep disturbance. Negative for confusion and hallucinations. The patient is nervous/anxious.     Immunization History  Administered Date(s) Administered   DT (Pediatric) 04/10/2002   Fluad Quad(high Dose 65+) 10/28/2019   Influenza Split 03/08/2011, 11/17/2011   Influenza Whole 01/09/2007, 11/05/2008, 11/04/2009   Influenza, High Dose Seasonal PF 12/07/2015, 09/29/2016, 11/09/2017, 09/27/2018, 10/28/2019   Influenza,trivalent, recombinat, inj, PF 03/08/2011, 11/17/2011   Influenza-Unspecified 10/30/2012, 11/29/2013, 12/02/2014, 11/25/2020   Moderna Sars-Covid-2 Vaccination 02/08/2019, 03/11/2019   PFIZER(Purple Top)SARS-COV-2 Vaccination 10/09/2019   PPD Test 12/22/2015, 01/05/2016   Pneumococcal Conjugate-13 07/17/2014   Pneumococcal Polysaccharide-23 08/15/2005, 09/25/2015   Tetanus 12/24/2012   Unspecified SARS-COV-2 Vaccination 02/11/2019, 12/17/2019, 10/27/2020   Zoster Recombinat (Shingrix) 06/07/2019, 09/06/2019   Zoster, Live 12/05/2013   Pertinent  Health Maintenance Due  Topic Date Due   INFLUENZA VACCINE  09/07/2021   DEXA SCAN  Completed      01/11/2021    8:09 PM 01/12/2021    9:15 AM 01/12/2021   10:01 PM 01/13/2021    8:14 AM 06/30/2021    3:06 PM  [place] in the past year?     0  Was there an injury with Fall?     0  Fall Risk Category Calculator     0  Fall Risk Category     Low  Patient Fall Risk Level     High fall risk  Patient at Risk for Falls Due to     Impaired mobility;Impaired balance/gait;History of fall(s)     Information is confidential and  restricted. Go to Review Flowsheets to unlock data.   Functional Status Survey:    Vitals:   09/03/21 1315  BP: 140/70  Pulse: 74  Resp: 20  Temp: (!) 97.3 F (36.3 C)  SpO2: 96%  Weight: 85 lb 12.8 oz (38.9 kg)  Height: '5\' 4"'$  (1.626 m)   Body mass index is 14.73 kg/m. Physical Exam Vitals and nursing note reviewed.  Constitutional:      Appearance: Normal appearance.  HENT:     Head: Normocephalic.     Nose: Nose normal.     Mouth/Throat:     Mouth: Mucous membranes are moist.  Eyes:     General:        Right eye: No discharge.        Left eye: Discharge present.    Comments: Left conjunctival redness, c/o itching and burning sensation, scant clear discharge.   Cardiovascular:     Rate and Rhythm: Normal rate.     Heart sounds: No murmur heard. Pulmonary:     Effort: Pulmonary effort is normal.     Breath sounds: No rales.  Abdominal:     General: Bowel sounds are normal.     Palpations: Abdomen is soft.     Tenderness: There is no abdominal tenderness.  Musculoskeletal:     Cervical back: Normal range of motion and neck supple.     Right lower leg: No edema.     Left lower leg: No edema.     Comments: Right hip s/p ORIF 09/08/20, healed.   Skin:    General: Skin is warm and dry.  Neurological:     General: No focal deficit present.  Mental Status: She is alert and oriented to person, place, and time. Mental status is at baseline.     Motor: No weakness.     Coordination: Coordination normal.     Gait: Gait abnormal.  Psychiatric:        Mood and Affect: Mood normal.        Behavior: Behavior normal.     Comments: Confused.      Labs reviewed: Recent Labs    12/27/20 1410 02/25/21 1533 06/01/21 0000 06/30/21 1605  NA 140 139 140 137  K 4.1 4.5 4.2 4.4  CL 105 101 101 98  CO2 31 31 30* 31  GLUCOSE 102* 89  --  91  BUN 29* '19 17 23  '$ CREATININE 0.52 0.74 0.6 0.68  CALCIUM 8.8* 9.9 9.2 10.1   Recent Labs    12/27/20 1410 02/25/21 1533  06/01/21 0000 06/30/21 1605  AST '17 21 23 25  '$ ALT '15 13 17 20  '$ ALKPHOS 53 61 67 62  BILITOT 0.4 0.4  --  0.4  PROT 6.0* 7.3  --  7.6  ALBUMIN 3.3* 4.1 4.0 4.3   Recent Labs    09/07/20 0400 09/18/20 0000 12/27/20 1410 06/01/21 0000 06/30/21 1605  WBC 8.1   < > 4.3 5.5 6.5  NEUTROABS  --    < > 2.3 3,031.00 4.1  HGB 10.1*   < > 12.0 13.8 13.3  HCT 30.1*   < > 36.8 43 40.8  MCV 92.0  --  95.8  --  92.8  PLT 136*   < > 158 180 199.0   < > = values in this interval not displayed.   Lab Results  Component Value Date   TSH 2.25 06/30/2021   Lab Results  Component Value Date   HGBA1C 5.5 01/06/2021   Lab Results  Component Value Date   CHOL 182 01/06/2021   HDL 58 01/06/2021   LDLCALC 117 (H) 01/06/2021   LDLDIRECT 135.4 12/26/2008   TRIG 37 01/06/2021   CHOLHDL 3.1 01/06/2021    Significant Diagnostic Results in last 30 days:  DG Thoracic Spine 2 View  Result Date: 08/13/2021 CLINICAL DATA:  Chronic thoracic spine pain. EXAM: THORACIC SPINE 2 VIEWS COMPARISON:  Thoracolumbar spine radiographs obtained at [facility] on 06/05/2020 FINDINGS: Stable T11, T12 and L1 vertebral compression deformities with T12 kyphoplasty material and associated acute kyphosis. Stable mild lordosis in the midthoracic spine. No acute fractures or subluxations. IMPRESSION: Stable old compression fractures with no acute abnormality seen. Electronically Signed   By: Claudie Revering M.D.   On: 08/13/2021 15:06   DG Lumbar Spine 2-3 Views  Result Date: 08/13/2021 CLINICAL DATA:  Chronic low back pain. EXAM: LUMBAR SPINE - 2-3 VIEW COMPARISON:  Abdomen and pelvis CT dated 08/18/2020. FINDINGS: Five non-rib-bearing lumbar vertebrae. No significant change in previously demonstrated T11, T12 and L1 vertebral compression deformities with associated kyphoplasty material at the T12 level. No new fractures or subluxations. Mild anterior spur formation at multiple levels. Mild levoconvex  thoracolumbar scoliosis. Cholecystectomy clips. IMPRESSION: 1. No acute abnormality. 2. Stable multilevel vertebral compression deformities and mild degenerative changes. Electronically Signed   By: Claudie Revering M.D.   On: 08/13/2021 15:04    Assessment/Plan Conjunctivitis, left eye Left eye redness, itching/burning sensation, denied eye pain or change of vision, will warm compress 90mn/eah qid x3 days, Naphcon A I gtt 4x/day x 5 days.   Vitamin B12 deficiency Vit B12 level >1504 06/30/21, will decrease  Vit B12/Folic Lozenge SL qod, repeat Vit B12 level 3 months.   Alzheimer disease ([place])  TSH 2.25, Vit B12 >1504 06/30/21,  SNF [facility] for supportive care. MMSE 27/30 04/08/21  Vitamin D deficiency  takes Vit d daily, Vit D 56.33 06/30/21  Allergic rhinitis takes Zyrtec.  Hyperlipidemia diet, LDL 117 01/06/21  Depression with suicidal ideation hx of suicidal attempt/ideations, anxious at times,  on Prozac, Zyprexa, Depakote, Mirtazapine made her AMS  Low back pain stable, T11, L1 compression fx, takes Tylenol, Gabapentin  Failure to thrive in adult  weight is stable, didn't tolerate Mirtazapine.  Thrombocytopenia ([facility]) plt 199 06/30/21  Slow transit constipation Stable, takes Senokot S, MiraLax.   HTN (hypertension) Blood pressure is controlled,  off Amlodipine. Bun/creat 23/0.68 06/30/21  Blood loss anemia Resolved, Hgb Iron 58, Hgb 13.3 06/30/21  Osteoporosis Treated with Evenity Vit D, Ca    Family/ staff Communication: plan of care reviewed with the patient and charge nurse.   Labs/tests ordered: Vit B12 3 months

## 2021-09-03 NOTE — Assessment & Plan Note (Signed)
Left eye redness, itching, warm compress 60mn/each qid x 3 days, Naphcon A I gtt 4x/day 2 5 days.

## 2021-09-03 NOTE — Assessment & Plan Note (Signed)
Stable, takes Senokot S, MiraLax. 

## 2021-09-03 NOTE — Assessment & Plan Note (Signed)
stable, T11, L1 compression fx, takes Tylenol, Gabapentin

## 2021-09-03 NOTE — Assessment & Plan Note (Signed)
takes Zyrtec. 

## 2021-09-06 ENCOUNTER — Non-Acute Institutional Stay (SKILLED_NURSING_FACILITY): Payer: Medicare Other | Admitting: Nurse Practitioner

## 2021-09-06 ENCOUNTER — Encounter: Payer: Self-pay | Admitting: Nurse Practitioner

## 2021-09-06 DIAGNOSIS — I10 Essential (primary) hypertension: Secondary | ICD-10-CM

## 2021-09-06 DIAGNOSIS — D500 Iron deficiency anemia secondary to blood loss (chronic): Secondary | ICD-10-CM

## 2021-09-06 DIAGNOSIS — M810 Age-related osteoporosis without current pathological fracture: Secondary | ICD-10-CM

## 2021-09-06 DIAGNOSIS — D696 Thrombocytopenia, unspecified: Secondary | ICD-10-CM

## 2021-09-06 DIAGNOSIS — F32A Depression, unspecified: Secondary | ICD-10-CM | POA: Diagnosis not present

## 2021-09-06 DIAGNOSIS — F411 Generalized anxiety disorder: Secondary | ICD-10-CM | POA: Diagnosis not present

## 2021-09-06 DIAGNOSIS — F0280 Dementia in other diseases classified elsewhere without behavioral disturbance: Secondary | ICD-10-CM

## 2021-09-06 DIAGNOSIS — J3089 Other allergic rhinitis: Secondary | ICD-10-CM

## 2021-09-06 DIAGNOSIS — E559 Vitamin D deficiency, unspecified: Secondary | ICD-10-CM

## 2021-09-06 DIAGNOSIS — K5901 Slow transit constipation: Secondary | ICD-10-CM

## 2021-09-06 DIAGNOSIS — E538 Deficiency of other specified B group vitamins: Secondary | ICD-10-CM | POA: Diagnosis not present

## 2021-09-06 DIAGNOSIS — F03A3 Unspecified dementia, mild, with mood disturbance: Secondary | ICD-10-CM | POA: Diagnosis not present

## 2021-09-06 DIAGNOSIS — E785 Hyperlipidemia, unspecified: Secondary | ICD-10-CM | POA: Diagnosis not present

## 2021-09-06 DIAGNOSIS — R45851 Suicidal ideations: Secondary | ICD-10-CM

## 2021-09-06 DIAGNOSIS — G309 Alzheimer's disease, unspecified: Secondary | ICD-10-CM

## 2021-09-06 DIAGNOSIS — R634 Abnormal weight loss: Secondary | ICD-10-CM

## 2021-09-06 DIAGNOSIS — F332 Major depressive disorder, recurrent severe without psychotic features: Secondary | ICD-10-CM | POA: Diagnosis not present

## 2021-09-06 DIAGNOSIS — G4700 Insomnia, unspecified: Secondary | ICD-10-CM | POA: Diagnosis not present

## 2021-09-06 DIAGNOSIS — M545 Low back pain, unspecified: Secondary | ICD-10-CM

## 2021-09-06 DIAGNOSIS — H1012 Acute atopic conjunctivitis, left eye: Secondary | ICD-10-CM

## 2021-09-06 NOTE — Progress Notes (Signed)
Location:   [facility] Room Number: 30A Place of Service:  SNF (31) Provider:  Eric Nees X, NP  Virgie Dad, MD  Patient Care Team: Virgie Dad, MD as PCP - General (Internal Medicine)  Extended Emergency Contact Information Primary Emergency Contact: Burman Blacksmith Address: [address]          [place], [place] [address] Johnnette Litter of [place] Phone: [phone] Relation: Spouse Secondary Emergency Contact: [employer] Phone: [phone] Relation: Daughter  Code Status:  FULL CODE Goals of care: Advanced Directive information    09/06/2021   11:30 AM  Advanced Directives  Does Patient Have a Medical Advance Directive? Yes  Type of Paramedic of [place];Living will  Does patient want to make changes to medical advance directive? No - Patient declined  Copy of [place] in Chart? Yes - validated most recent copy scanned in chart (See row information)     Chief Complaint  Patient presents with  . Medical Management of Chronic Issues    Routine follow up    HPI:  Pt is a 81 y.o. female seen today for medical management of chronic diseases.    Left eye conjunctivitis: better, started Naphcon A eye drop.    OP, treated with Evenity, Vit D, Ca.              Anemia, Hgb Iron 58, Hgb 13.3 06/30/21             HTN, off Amlodipine. Bun/creat 23/0.68 06/30/21             Constipation, takes Senokot S, MiraLax.              Alzheimer's Dementia, TSH 2.25, Vit B12 >1504 06/30/21,  SNF [facility] for supportive care. MMSE 27/30 04/08/21             Vit B12 deficiency, Vit B12 level >1504 06/30/21, takes Vit B12             Vitamin D deficiency, takes Vit d daily, Vit D 56.33 06/30/21             Allergic rhinitis, takes Zyrtec.             Hyperlipidemia, diet, LDL 117 01/06/21             Depression, hx of suicidal attempt/ideations, anxious at times,  on Prozac, Zyprexa, Depakote, Mirtazapine made her  AMS             Chronic back pain, stable, T11, L1 compression fx, takes Tylenol, Gabapentin             Weight loss/adult failure to thrive, weight is stable, didn't tolerate Mirtazapine.             Thrombocytopenia, plt 199 06/30/21  Past Medical History:  Diagnosis Date  . Anxiety    no meds  . 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 Depression    years ago  . Headache    sinus headaches  . Lipoma of arm    Right  . Medical history non-contributory   . Osteoporosis   . SVD (spontaneous vaginal delivery)    x 2  . Varicose vein   . Vitamin D deficiency    Past Surgical History:  Procedure Laterality Date  . ANTERIOR APPROACH HEMI HIP ARTHROPLASTY Right 09/04/2020   Procedure: ANTERIOR APPROACH HEMI HIP ARTHROPLASTY;  Surgeon: Rod Can,  MD;  Location: [facility];  Service: Orthopedics;  Laterality: Right;  . APPENDECTOMY  1947  . CHOLECYSTECTOMY  1988  . COLONOSCOPY    . GANGLION CYST EXCISION     x2 left and right arm  . HYSTEROSCOPY WITH D & C N/A 07/15/2013   Procedure: DILATATION AND CURETTAGE /HYSTEROSCOPY;  Surgeon: Lovenia Kim, MD;  Location: [facility];  Service: Gynecology;  Laterality: N/A;  . KYPHOPLASTY Bilateral 05/04/2020   Procedure: KYPHOPLASTY THORACIC TWELVE;  Surgeon: Vallarie Mare, MD;  Location: [facility];  Service: Neurosurgery;  Laterality: Bilateral;  . [place]  . WISDOM TOOTH EXTRACTION      Allergies  Allergen Reactions  . Alendronate Sodium Other (See Comments)    Leg cramps and "Allergic," per MAR  . Risedronate Sodium Other (See Comments)    Caused the patient to be achy and is "Allergic," per MAR  . Tramadol Other (See Comments)    Caused the patient to feel badly   . Keflex [Cephalexin] Other (See Comments)    Possible rash    Allergies as of 09/06/2021       Reactions   Alendronate Sodium Other (See Comments)   Leg cramps and "Allergic," per MAR   Risedronate Sodium Other (See Comments)   Caused the  patient to be achy and is "Allergic," per MAR   Tramadol Other (See Comments)   Caused the patient to feel badly    Keflex [cephalexin] Other (See Comments)   Possible rash        Medication List        Accurate as of September 06, 2021 11:59 PM. If you have any questions, ask your nurse or doctor.          acetaminophen 500 MG tablet Commonly known as: TYLENOL Take 1,000 mg by mouth every 8 (eight) hours as needed for moderate pain, mild pain, headache or fever.   acetaminophen 325 MG tablet Commonly known as: TYLENOL Take 650 mg by mouth in the morning and at bedtime.   ASPIRIN 81 PO Take 1 tablet by mouth daily.   Blue-Emu Super Strength Crea Apply 4 oz topically. Apply thin layer to the back.   CALCIUM-CARB 600 PO Take 1 tablet by mouth 2 (two) times daily.   Camphor-Menthol-Methyl Sal 3.02-13-08 % Ptch Apply 1 patch topically daily as needed.   cetirizine 10 MG tablet Commonly known as: ZyrTEC Allergy Take 1 tablet (10 mg total) by mouth daily.   divalproex 125 MG capsule Commonly known as: DEPAKOTE SPRINKLE Take 125 mg by mouth 2 (two) times daily.   docusate sodium 100 MG capsule Commonly known as: COLACE Take 100 mg by mouth at bedtime.   FLUoxetine 20 MG capsule Commonly known as: PROZAC Take 1 capsule (20 mg total) by mouth at bedtime.   gabapentin 100 MG capsule Commonly known as: NEURONTIN Take 1 capsule (100 mg total) by mouth 3 (three) times daily.   melatonin 5 MG Tabs Take 5 mg by mouth at bedtime.   Naphcon-A 0.025-0.3 % ophthalmic solution Generic drug: naphazoline-pheniramine Place 1 drop into the left eye 4 (four) times daily as needed for eye irritation.   Nizoral A-D 1 % Sham Generic drug: KETOCONAZOLE (TOPICAL) Apply 1 application. topically once a week. Monday   OLANZapine 7.5 MG tablet Commonly known as: ZYPREXA Take 7.5 mg by mouth at bedtime.   polyethylene glycol 17 g packet Commonly known as: MIRALAX / GLYCOLAX Take 17  g by mouth  daily.   PRESERVISION AREDS 2 PO Take 1 capsule by mouth 2 (two) times daily.   senna-docusate 8.6-50 MG tablet Commonly known as: Senokot-S Take 2 tablets by mouth daily.   sodium phosphate 7-19 GM/118ML Enem Place 1 enema rectally daily as needed for severe constipation or mild constipation.   VITAMIN B12-FOLIC ACID PO Place 1 lozenge under the tongue in the morning.   Vitamin D3 50 MCG (2000 UT) capsule Take 1 capsule (2,000 Units total) by mouth daily.        Review of Systems  Constitutional:  Negative for activity change, appetite change and fever.  HENT:  Positive for hearing loss. Negative for congestion and voice change.   Eyes:  Positive for discharge, redness and itching. Negative for pain and visual disturbance.       Left eye, improved  Respiratory:  Negative for cough and shortness of breath.   Cardiovascular:  Negative for leg swelling.  Gastrointestinal:  Negative for abdominal pain, constipation, nausea and vomiting.  Genitourinary:  Negative for dysuria and urgency.  Musculoskeletal:  Positive for arthralgias, back pain and gait problem.  Skin:  Negative for color change.  Neurological:  Negative for speech difficulty, weakness and headaches.       Dementia.   Psychiatric/Behavioral:  Positive for dysphoric mood and sleep disturbance. Negative for confusion and hallucinations. The patient is nervous/anxious.     Immunization History  Administered Date(s) Administered  . DT (Pediatric) 04/10/2002  . Fluad Quad(high Dose 65+) 10/28/2019  . Influenza Split 03/08/2011, 11/17/2011  . Influenza Whole 01/09/2007, 11/05/2008, 11/04/2009  . Influenza, High Dose Seasonal PF 12/07/2015, 09/29/2016, 11/09/2017, 09/27/2018, 10/28/2019  . Influenza,trivalent, recombinat, inj, PF 03/08/2011, 11/17/2011  . Influenza-Unspecified 10/30/2012, 11/29/2013, 12/02/2014, 11/25/2020  . Moderna Sars-Covid-2 Vaccination 02/08/2019, 03/11/2019  . PFIZER(Purple  Top)SARS-COV-2 Vaccination 10/09/2019  . PPD Test 12/22/2015, 01/05/2016  . Pneumococcal Conjugate-13 07/17/2014  . Pneumococcal Polysaccharide-23 08/15/2005, 09/25/2015  . Tetanus 12/24/2012  . Unspecified SARS-COV-2 Vaccination 02/11/2019, 12/17/2019, 10/27/2020  . Zoster Recombinat (Shingrix) 06/07/2019, 09/06/2019  . Zoster, Live 12/05/2013   Pertinent  Health Maintenance Due  Topic Date Due  . INFLUENZA VACCINE  09/07/2021  . DEXA SCAN  Completed      01/11/2021    8:09 PM 01/12/2021    9:15 AM 01/12/2021   10:01 PM 01/13/2021    8:14 AM 06/30/2021    3:06 PM  Fall Risk  Falls in the past year?     0  Was there an injury with Fall?     0  Fall Risk Category Calculator     0  Fall Risk Category     Low  Patient Fall Risk Level     High fall risk  Patient at Risk for Falls Due to     Impaired mobility;Impaired balance/gait;History of fall(s)     Information is confidential and restricted. Go to Review Flowsheets to unlock data.   Functional Status Survey:    Vitals:   09/06/21 1128  BP: 140/70  Pulse: 74  Resp: 20  Temp: (!) 97.3 F (36.3 C)  SpO2: 99%  Weight: 85 lb 12.8 oz (38.9 kg)  Height: '5\' 4"'$  (1.626 m)   Body mass index is 14.73 kg/m. Physical Exam Vitals and nursing note reviewed.  Constitutional:      Appearance: Normal appearance.  HENT:     Head: Normocephalic.     Nose: Nose normal.     Mouth/Throat:     Mouth: Mucous membranes are moist.  Eyes:     General:        Right eye: No discharge.        Left eye: Discharge present.    Comments: Left conjunctival redness, c/o itching and burning sensation, scant clear discharge, improved  Cardiovascular:     Rate and Rhythm: Normal rate.     Heart sounds: No murmur heard. Pulmonary:     Effort: Pulmonary effort is normal.     Breath sounds: No rales.  Abdominal:     General: Bowel sounds are normal.     Palpations: Abdomen is soft.     Tenderness: There is no abdominal tenderness.   Musculoskeletal:     Cervical back: Normal range of motion and neck supple.     Right lower leg: No edema.     Left lower leg: No edema.     Comments: Right hip s/p ORIF 09/08/20, healed.   Skin:    General: Skin is warm and dry.  Neurological:     General: No focal deficit present.     Mental Status: She is alert and oriented to person, place, and time. Mental status is at baseline.     Motor: No weakness.     Coordination: Coordination normal.     Gait: Gait abnormal.     Comments: Ambulates with walker.   Psychiatric:        Mood and Affect: Mood normal.        Behavior: Behavior normal.     Comments: Confused.     Labs reviewed: Recent Labs    12/27/20 1410 02/25/21 1533 06/01/21 0000 06/30/21 1605  NA 140 139 140 137  K 4.1 4.5 4.2 4.4  CL 105 101 101 98  CO2 31 31 30* 31  GLUCOSE 102* 89  --  91  BUN 29* '19 17 23  '$ CREATININE 0.52 0.74 0.6 0.68  CALCIUM 8.8* 9.9 9.2 10.1   Recent Labs    12/27/20 1410 02/25/21 1533 06/01/21 0000 06/30/21 1605  AST '17 21 23 25  '$ ALT '15 13 17 20  '$ ALKPHOS 53 61 67 62  BILITOT 0.4 0.4  --  0.4  PROT 6.0* 7.3  --  7.6  ALBUMIN 3.3* 4.1 4.0 4.3   Recent Labs    12/27/20 1410 06/01/21 0000 06/30/21 1605  WBC 4.3 5.5 6.5  NEUTROABS 2.3 3,031.00 4.1  HGB 12.0 13.8 13.3  HCT 36.8 43 40.8  MCV 95.8  --  92.8  PLT 158 180 199.0   Lab Results  Component Value Date   TSH 2.25 06/30/2021   Lab Results  Component Value Date   HGBA1C 5.5 01/06/2021   Lab Results  Component Value Date   CHOL 182 01/06/2021   HDL 58 01/06/2021   LDLCALC 117 (H) 01/06/2021   LDLDIRECT 135.4 12/26/2008   TRIG 37 01/06/2021   CHOLHDL 3.1 01/06/2021    Significant Diagnostic Results in last 30 days:  DG Thoracic Spine 2 View  Result Date: 08/13/2021 CLINICAL DATA:  Chronic thoracic spine pain. EXAM: THORACIC SPINE 2 VIEWS COMPARISON:  Thoracolumbar spine radiographs obtained at [facility] on 06/05/2020 FINDINGS:  Stable T11, T12 and L1 vertebral compression deformities with T12 kyphoplasty material and associated acute kyphosis. Stable mild lordosis in the midthoracic spine. No acute fractures or subluxations. IMPRESSION: Stable old compression fractures with no acute abnormality seen. Electronically Signed   By: Claudie Revering M.D.   On: 08/13/2021 15:06   DG Lumbar Spine 2-3 Views  Result Date: 08/13/2021 CLINICAL DATA:  Chronic low back pain. EXAM: LUMBAR SPINE - 2-3 VIEW COMPARISON:  Abdomen and pelvis CT dated 08/18/2020. FINDINGS: Five non-rib-bearing lumbar vertebrae. No significant change in previously demonstrated T11, T12 and L1 vertebral compression deformities with associated kyphoplasty material at the T12 level. No new fractures or subluxations. Mild anterior spur formation at multiple levels. Mild levoconvex thoracolumbar scoliosis. Cholecystectomy clips. IMPRESSION: 1. No acute abnormality. 2. Stable multilevel vertebral compression deformities and mild degenerative changes. Electronically Signed   By: Claudie Revering M.D.   On: 08/13/2021 15:04    Assessment/Plan HTN (hypertension) off Amlodipine. Bun/creat 23/0.68 06/30/21  Slow transit constipation takes Senokot S, MiraLax.   Alzheimer disease ([place]) SH 2.25, Vit B12 >1504 06/30/21,  SNF [facility] for supportive care. MMSE 27/30 04/08/21  Vitamin B12 deficiency  Vit B12 level >1504 06/30/21, takes Vit B12  Vitamin D deficiency  takes Vit d daily, Vit D 56.33 06/30/21  Allergic rhinitis takes Zyrtec.  Hyperlipidemia  diet, LDL 117 01/06/21  Depression with suicidal ideation hx of suicidal attempt/ideations, anxious at times,  on Prozac, Zyprexa, Depakote, Mirtazapine made her AMS  Low back pain stable, T11, L1 compression fx, takes Tylenol, Gabapentin  Weight loss, abnormal  weight is stable, didn't tolerate Mirtazapine.  Thrombocytopenia ([place]) plt 199 06/30/21  Osteoporosis treated with Evenity, Vit D, Ca.   Blood loss anemia Hgb  Iron 58, Hgb 13.3 06/30/21  Conjunctivitis, left eye better, started Naphcon A eye drop.      Family/ staff Communication: plan of care reviewed with the patient and charge nurse.   Labs/tests ordered:  none  Time spend 35 minutes

## 2021-09-07 ENCOUNTER — Encounter: Payer: Self-pay | Admitting: Nurse Practitioner

## 2021-09-07 NOTE — Assessment & Plan Note (Signed)
hx of suicidal attempt/ideations, anxious at times,  on Prozac, Zyprexa, Depakote, Mirtazapine made her AMS 

## 2021-09-07 NOTE — Assessment & Plan Note (Signed)
takes Senokot S, MiraLax. 

## 2021-09-07 NOTE — Assessment & Plan Note (Signed)
SH 2.25, Vit B12 >1504 06/30/21,  SNF [facility] for supportive care. MMSE 27/30 04/08/21

## 2021-09-07 NOTE — Assessment & Plan Note (Signed)
better, started Naphcon A eye drop.

## 2021-09-07 NOTE — Assessment & Plan Note (Signed)
weight is stable, didn't tolerate Mirtazapine.

## 2021-09-07 NOTE — Assessment & Plan Note (Signed)
diet, LDL 117 01/06/21 

## 2021-09-07 NOTE — Assessment & Plan Note (Signed)
takes Zyrtec. 

## 2021-09-07 NOTE — Assessment & Plan Note (Signed)
treated with Evenity, Vit D, Ca.

## 2021-09-07 NOTE — Assessment & Plan Note (Signed)
Hgb Iron 58, Hgb 13.3 06/30/21

## 2021-09-07 NOTE — Assessment & Plan Note (Signed)
stable, T11, L1 compression fx, takes Tylenol, Gabapentin

## 2021-09-07 NOTE — Assessment & Plan Note (Signed)
off Amlodipine. Bun/creat 23/0.68 06/30/21

## 2021-09-07 NOTE — Assessment & Plan Note (Signed)
plt 199 06/30/21

## 2021-09-07 NOTE — Assessment & Plan Note (Signed)
takes Vit d daily, Vit D 56.33 06/30/21 

## 2021-09-07 NOTE — Assessment & Plan Note (Signed)
Vit B12 level >1504 06/30/21, takes Vit B12 

## 2021-09-10 ENCOUNTER — Encounter: Payer: Self-pay | Admitting: Nurse Practitioner

## 2021-09-28 ENCOUNTER — Encounter: Payer: Self-pay | Admitting: Internal Medicine

## 2021-09-28 ENCOUNTER — Non-Acute Institutional Stay (SKILLED_NURSING_FACILITY): Payer: Medicare Other | Admitting: Internal Medicine

## 2021-09-28 DIAGNOSIS — F03A2 Unspecified dementia, mild, with psychotic disturbance: Secondary | ICD-10-CM

## 2021-09-28 DIAGNOSIS — F3341 Major depressive disorder, recurrent, in partial remission: Secondary | ICD-10-CM | POA: Diagnosis not present

## 2021-09-28 DIAGNOSIS — H1032 Unspecified acute conjunctivitis, left eye: Secondary | ICD-10-CM | POA: Diagnosis not present

## 2021-09-28 DIAGNOSIS — M810 Age-related osteoporosis without current pathological fracture: Secondary | ICD-10-CM

## 2021-09-28 NOTE — Progress Notes (Signed)
Location:   [facility] Room Number: 30 A Place of Service:  SNF [phone]) Provider:  Veleta Miners, MD  Virgie Dad, MD  Patient Care Team: Virgie Dad, MD as PCP - General (Internal Medicine)  Extended Emergency Contact Information Primary Emergency Contact: Burman Blacksmith Address: [address]          [place], [place] [address] Johnnette Litter of [place] Phone: [phone] Relation: Spouse Secondary Emergency Contact: [facility] Phone: [phone] Relation: Daughter  Code Status:  FULL CODE Goals of care: Advanced Directive information    09/28/2021    2:24 PM  Advanced Directives  Does Patient Have a Medical Advance Directive? Yes  Type of Paramedic of [place];Living will  Does patient want to make changes to medical advance directive? No - Patient declined  Copy of [place] in Chart? Yes - validated most recent copy scanned in chart (See row information)     Chief Complaint  Patient presents with   Medical Management of Chronic Issues    Routine follow up   Immunizations    COVID vaccine and flu vaccine due    HPI:  Pt is a 81 y.o. female seen today for medical management of chronic diseases.    Patient lives in SNF   Patient has h/o Osteoporosis with Multiple Compression Fracture  S/p right hip hemiarthroplasty Admitted in the hospital from 12/30/20-01/13/21 For Suicide attempt and Acute depression  Was planning to go back to her apartment to be with her husband but then refused again to [facility] SNF She is doing well otherwise   Had some redness in her Left Eye with itching and discharge   No new Nursing issues. 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   Depression    years ago   Headache    sinus headaches   Lipoma of arm    Right   Medical history non-contributory    Osteoporosis    SVD (spontaneous vaginal delivery)    x 2   Varicose vein    Vitamin D deficiency    Past Surgical History:  Procedure Laterality Date   ANTERIOR APPROACH HEMI HIP ARTHROPLASTY Right 09/04/2020   Procedure: ANTERIOR APPROACH HEMI HIP ARTHROPLASTY;  Surgeon: Rod Can, MD;  Location: [facility];  Service: Orthopedics;  Laterality: Right;   [place]   COLONOSCOPY     GANGLION CYST EXCISION     x2 left and right arm   HYSTEROSCOPY WITH D & C N/A 07/15/2013   Procedure: DILATATION AND CURETTAGE /HYSTEROSCOPY;  Surgeon: Lovenia Kim, MD;  Location: [facility];  Service: Gynecology;  Laterality: N/A;   KYPHOPLASTY Bilateral 05/04/2020   Procedure: KYPHOPLASTY THORACIC TWELVE;  Surgeon: Vallarie Mare, MD;  Location: [facility];  Service: Neurosurgery;  Laterality: Bilateral;   MANDIBLE SURGERY  1954   WISDOM TOOTH EXTRACTION      Allergies  Allergen Reactions   Alendronate Sodium Other (See Comments)    Leg cramps and "Allergic," per MAR   Risedronate Sodium Other (See Comments)  Caused the patient to be achy and is "Allergic," per MAR   Tramadol Other (See Comments)    Caused the patient to feel badly    Keflex [Cephalexin] Other (See Comments)    Possible rash    Allergies as of 09/28/2021       Reactions   Alendronate Sodium Other (See Comments)   Leg cramps and "Allergic," per MAR   Risedronate Sodium Other (See Comments)   Caused the patient to be achy and is "Allergic," per MAR   Tramadol Other (See Comments)   Caused the patient to feel badly    Keflex [cephalexin] Other (See Comments)   Possible rash        Medication List        Accurate as of September 28, 2021  2:25 PM. If you have any questions, ask your nurse or doctor.           STOP taking these medications    Naphcon-A 0.025-0.3 % ophthalmic solution Generic drug: naphazoline-pheniramine Stopped by: Virgie Dad, MD       TAKE these medications    acetaminophen 500 MG tablet Commonly known as: TYLENOL Take 1,000 mg by mouth every 8 (eight) hours as needed for moderate pain, mild pain, headache or fever.   acetaminophen 325 MG tablet Commonly known as: TYLENOL Take 650 mg by mouth in the morning and at bedtime.   ASPIRIN 81 PO Take 1 tablet by mouth daily.   Blue-Emu Super Strength Crea Apply 4 oz topically. Apply thin layer to the back.   CALCIUM-CARB 600 PO Take 1 tablet by mouth 2 (two) times daily.   Camphor-Menthol-Methyl Sal 3.02-13-08 % Ptch Apply 1 patch topically daily as needed.   cetirizine 10 MG tablet Commonly known as: ZyrTEC Allergy Take 1 tablet (10 mg total) by mouth daily.   divalproex 125 MG capsule Commonly known as: DEPAKOTE SPRINKLE Take 125 mg by mouth 2 (two) times daily.   docusate sodium 100 MG capsule Commonly known as: COLACE Take 100 mg by mouth at bedtime.   FLUoxetine 20 MG capsule Commonly known as: PROZAC Take 1 capsule (20 mg total) by mouth at bedtime.   gabapentin 100 MG capsule Commonly known as: NEURONTIN Take 1 capsule (100 mg total) by mouth 3 (three) times daily.   melatonin 5 MG Tabs Take 5 mg by mouth at bedtime.   Nizoral A-D 1 % Sham Generic drug: KETOCONAZOLE (TOPICAL) Apply 1 application. topically once a week. Monday   OLANZapine 7.5 MG tablet Commonly known as: ZYPREXA Take 7.5 mg by mouth at bedtime.   polyethylene glycol 17 g packet Commonly known as: MIRALAX / GLYCOLAX Take 17 g by mouth daily.   PRESERVISION AREDS 2 PO Take 1 capsule by mouth 2 (two) times daily.   senna-docusate 8.6-50 MG tablet Commonly known as: Senokot-S Take 2 tablets by mouth daily.   sodium phosphate 7-19 GM/118ML Enem Place 1 enema rectally daily as needed for severe constipation or  mild constipation.   VITAMIN B12-FOLIC ACID PO Place 1 lozenge under the tongue in the morning.   Vitamin D3 50 MCG (2000 UT) capsule Take 1 capsule (2,000 Units total) by mouth daily.        Review of Systems  Constitutional:  Negative for activity change and appetite change.  HENT: Negative.    Respiratory:  Negative for cough and shortness of breath.   Cardiovascular:  Negative for leg swelling.  Gastrointestinal:  Positive for constipation.  Genitourinary: Negative.  Musculoskeletal:  Positive for back pain. Negative for arthralgias, gait problem and myalgias.  Skin: Negative.   Neurological:  Negative for dizziness and weakness.  Psychiatric/Behavioral:  Positive for confusion, dysphoric mood and sleep disturbance. The patient is nervous/anxious.     Immunization History  Administered Date(s) Administered   DT (Pediatric) 04/10/2002   Fluad Quad(high Dose 65+) 10/28/2019   Influenza Split 03/08/2011, 11/17/2011   Influenza Whole 01/09/2007, 11/05/2008, 11/04/2009   Influenza, High Dose Seasonal PF 12/07/2015, 09/29/2016, 11/09/2017, 09/27/2018, 10/28/2019   Influenza,trivalent, recombinat, inj, PF 03/08/2011, 11/17/2011   Influenza-Unspecified 10/30/2012, 11/29/2013, 12/02/2014, 11/25/2020   Moderna Sars-Covid-2 Vaccination 02/08/2019, 03/11/2019   PFIZER(Purple Top)SARS-COV-2 Vaccination 10/09/2019   PPD Test 12/22/2015, 01/05/2016   Pneumococcal Conjugate-13 07/17/2014   Pneumococcal Polysaccharide-23 08/15/2005, 09/25/2015   Tetanus 12/24/2012   Unspecified SARS-COV-2 Vaccination 02/11/2019, 12/17/2019, 10/27/2020   Zoster Recombinat (Shingrix) 06/07/2019, 09/06/2019   Zoster, Live 12/05/2013   Pertinent  Health Maintenance Due  Topic Date Due   INFLUENZA VACCINE  09/07/2021   DEXA SCAN  Completed      01/11/2021    8:09 PM 01/12/2021    9:15 AM 01/12/2021   10:01 PM 01/13/2021    8:14 AM 06/30/2021    3:06 PM  [place] in the past year?     0   Was there an injury with Fall?     0  Fall Risk Category Calculator     0  Fall Risk Category     Low  Patient Fall Risk Level     High fall risk  Patient at Risk for Falls Due to     Impaired mobility;Impaired balance/gait;History of fall(s)     Information is confidential and restricted. Go to Review Flowsheets to unlock data.   Functional Status Survey:    Vitals:   09/28/21 1412  BP: (!) 138/55  Pulse: 84  Resp: 20  Temp: (!) 97.3 F (36.3 C)  SpO2: 92%  Weight: 86 lb (39 kg)  Height: '5\' 4"'$  (1.626 m)   Body mass index is 14.76 kg/m. Physical Exam Vitals reviewed.  Constitutional:      Appearance: Normal appearance.     Comments: Very frail  HENT:     Head: Normocephalic.     Nose: Nose normal.     Mouth/Throat:     Mouth: Mucous membranes are moist.     Pharynx: Oropharynx is clear.  Eyes:     Pupils: Pupils are equal, round, and reactive to light.  Cardiovascular:     Rate and Rhythm: Normal rate and regular rhythm.     Pulses: Normal pulses.     Heart sounds: Normal heart sounds. No murmur heard. Pulmonary:     Effort: Pulmonary effort is normal.     Breath sounds: Normal breath sounds.  Abdominal:     General: Abdomen is flat. Bowel sounds are normal.     Palpations: Abdomen is soft.  Musculoskeletal:        General: No swelling.     Cervical back: Neck supple.  Skin:    General: Skin is warm.  Neurological:     General: No focal deficit present.     Mental Status: She is alert and oriented to person, place, and time.  Psychiatric:        Mood and Affect: Mood normal.        Thought Content: Thought content normal.     Labs reviewed: Recent Labs    12/27/20 1410 02/25/21  1533 06/01/21 0000 06/30/21 1605  NA 140 139 140 137  K 4.1 4.5 4.2 4.4  CL 105 101 101 98  CO2 31 31 30* 31  GLUCOSE 102* 89  --  91  BUN 29* '19 17 23  '$ CREATININE 0.52 0.74 0.6 0.68  CALCIUM 8.8* 9.9 9.2 10.1   Recent Labs    12/27/20 1410 02/25/21 1533  06/01/21 0000 06/30/21 1605  AST '17 21 23 25  '$ ALT '15 13 17 20  '$ ALKPHOS 53 61 67 62  BILITOT 0.4 0.4  --  0.4  PROT 6.0* 7.3  --  7.6  ALBUMIN 3.3* 4.1 4.0 4.3   Recent Labs    12/27/20 1410 06/01/21 0000 06/30/21 1605  WBC 4.3 5.5 6.5  NEUTROABS 2.3 3,031.00 4.1  HGB 12.0 13.8 13.3  HCT 36.8 43 40.8  MCV 95.8  --  92.8  PLT 158 180 199.0   Lab Results  Component Value Date   TSH 2.25 06/30/2021   Lab Results  Component Value Date   HGBA1C 5.5 01/06/2021   Lab Results  Component Value Date   CHOL 182 01/06/2021   HDL 58 01/06/2021   LDLCALC 117 (H) 01/06/2021   LDLDIRECT 135.4 12/26/2008   TRIG 37 01/06/2021   CHOLHDL 3.1 01/06/2021    Significant Diagnostic Results in last 30 days:  No results found.  Assessment/Plan 1. Age-related osteoporosis without current pathological fracture Patient has never received her Evenity  Will Order for DEXA here in facility if she agrees Can consider Prolia thought patient has refused before  2. Mild dementia with psychotic disturbance, unspecified dementia type ([place]) Doing well on Depakote MMSE 27/30  3. Recurrent major depressive disorder, in partial remission ([place]) On Prozac , Zyprexa  Not candidate for GDR Cant tolerate Remeron  4. Acute conjunctivitis of left eye, unspecified acute conjunctivitis type Ocuflox QID for 2 weeks 5 Constipation 6 low backpain  On Gabapentin   Family/ staff Communication:   Labs/tests ordered:

## 2021-09-30 ENCOUNTER — Telehealth: Payer: Self-pay

## 2021-09-30 NOTE — Telephone Encounter (Signed)
Rica Mote with [employer] is calling in regards to paperwork for a PA that the provider is needing to sign and be faxed over with the most recent office notes attached.  Fax #: [phone]

## 2021-09-30 NOTE — Telephone Encounter (Signed)
Rec'd forms place in MD purple folder for signature../lm,b

## 2021-10-01 NOTE — Telephone Encounter (Signed)
Okay.  Thanks.

## 2021-10-04 DIAGNOSIS — F411 Generalized anxiety disorder: Secondary | ICD-10-CM | POA: Diagnosis not present

## 2021-10-04 DIAGNOSIS — F332 Major depressive disorder, recurrent severe without psychotic features: Secondary | ICD-10-CM | POA: Diagnosis not present

## 2021-10-04 DIAGNOSIS — G4700 Insomnia, unspecified: Secondary | ICD-10-CM | POA: Diagnosis not present

## 2021-10-04 DIAGNOSIS — F03A3 Unspecified dementia, mild, with mood disturbance: Secondary | ICD-10-CM | POA: Diagnosis not present

## 2021-10-06 DIAGNOSIS — L570 Actinic keratosis: Secondary | ICD-10-CM | POA: Diagnosis not present

## 2021-10-06 DIAGNOSIS — L821 Other seborrheic keratosis: Secondary | ICD-10-CM | POA: Diagnosis not present

## 2021-10-06 DIAGNOSIS — L814 Other melanin hyperpigmentation: Secondary | ICD-10-CM | POA: Diagnosis not present

## 2021-10-06 DIAGNOSIS — Z85528 Personal history of other malignant neoplasm of kidney: Secondary | ICD-10-CM | POA: Diagnosis not present

## 2021-10-12 ENCOUNTER — Non-Acute Institutional Stay (SKILLED_NURSING_FACILITY): Payer: Medicare Other | Admitting: Nurse Practitioner

## 2021-10-12 ENCOUNTER — Encounter: Payer: Self-pay | Admitting: Nurse Practitioner

## 2021-10-12 DIAGNOSIS — E785 Hyperlipidemia, unspecified: Secondary | ICD-10-CM

## 2021-10-12 DIAGNOSIS — J3089 Other allergic rhinitis: Secondary | ICD-10-CM | POA: Diagnosis not present

## 2021-10-12 DIAGNOSIS — F0280 Dementia in other diseases classified elsewhere without behavioral disturbance: Secondary | ICD-10-CM | POA: Diagnosis not present

## 2021-10-12 DIAGNOSIS — G309 Alzheimer's disease, unspecified: Secondary | ICD-10-CM

## 2021-10-12 DIAGNOSIS — R627 Adult failure to thrive: Secondary | ICD-10-CM

## 2021-10-12 DIAGNOSIS — F32A Depression, unspecified: Secondary | ICD-10-CM

## 2021-10-12 DIAGNOSIS — R45851 Suicidal ideations: Secondary | ICD-10-CM | POA: Diagnosis not present

## 2021-10-12 DIAGNOSIS — E559 Vitamin D deficiency, unspecified: Secondary | ICD-10-CM

## 2021-10-12 DIAGNOSIS — M159 Polyosteoarthritis, unspecified: Secondary | ICD-10-CM | POA: Diagnosis not present

## 2021-10-12 DIAGNOSIS — D696 Thrombocytopenia, unspecified: Secondary | ICD-10-CM | POA: Diagnosis not present

## 2021-10-12 DIAGNOSIS — M150 Primary generalized (osteo)arthritis: Secondary | ICD-10-CM

## 2021-10-12 NOTE — Progress Notes (Signed)
Location:   [facility] Room Number: 30-A Place of Service:  SNF (31) Provider:  Zale Marcotte, NP  PCP: Cassandria Anger, MD  Patient Care Team: Cassandria Anger, MD as PCP - General (Internal Medicine)  Extended Emergency Contact Information Primary Emergency Contact: Burman Blacksmith Address: [address]          [place], [place] [address] [place] Phone: [phone] Relation: Spouse Secondary Emergency Contact: [facility] Phone: [phone] Relation: Daughter  Code Status:  FULL CODE Goals of care: Advanced Directive information    10/12/2021    9:24 AM  Advanced Directives  Does Patient Have a Medical Advance Directive? Yes  Type of Paramedic of [place];Living will  Does patient want to make changes to medical advance directive? No - Patient declined  Copy of [place] in Chart? Yes - validated most recent copy scanned in chart (See row information)     Chief Complaint  Patient presents with   Medical Management of Chronic Issues    Routine Visit.    Immunizations    Discuss the need for Covid Booster, and Influenza vaccine.    HPI:  Pt is a 81 y.o. female seen today for medical management of chronic diseases.      OP, never received Evenity, taking Vit D, Ca. pending repeated DEXA, may consider Prolia.              Anemia, Hgb Iron 58, Hgb 13.3 06/30/21             HTN, off Amlodipine. Bun/creat 17/0.7 07/22/21             Constipation, takes Senokot S, MiraLax.              Alzheimer's Dementia, TSH 2.25, Vit B12 >1504 06/30/21,  SNF [facility] for supportive care. MMSE 27/30 04/08/21             Vit B12 deficiency, Vit B12 level >1504 06/30/21, takes Vit B12             Vitamin D deficiency, takes Vit d daily, Vit D 56.33 06/30/21             Allergic rhinitis, takes Zyrtec.             Hyperlipidemia, diet, LDL 117 01/06/21             Depression, hx of suicidal  attempt/ideations, anxious at times,  on Prozac, Zyprexa, Depakote, Mirtazapine made her AMS             Chronic back pain, stable, T11, L1 compression fx, takes Tylenol, Gabapentin             Weight loss/adult failure to thrive, weight is stable, didn't tolerate Mirtazapine.             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   Depression    years ago   Headache    sinus headaches   Lipoma of arm    Right   Medical history non-contributory    Osteoporosis    SVD (spontaneous vaginal delivery)    x 2   Varicose vein    Vitamin D deficiency    Past Surgical History:  Procedure Laterality Date   ANTERIOR APPROACH HEMI HIP ARTHROPLASTY Right 09/04/2020  Procedure: ANTERIOR APPROACH HEMI HIP ARTHROPLASTY;  Surgeon: Rod Can, MD;  Location: [facility];  Service: Orthopedics;  Laterality: Right;   [place]   COLONOSCOPY     GANGLION CYST EXCISION     x2 left and right arm   HYSTEROSCOPY WITH D & C N/A 07/15/2013   Procedure: DILATATION AND CURETTAGE /HYSTEROSCOPY;  Surgeon: Lovenia Kim, MD;  Location: [facility];  Service: Gynecology;  Laterality: N/A;   KYPHOPLASTY Bilateral 05/04/2020   Procedure: KYPHOPLASTY THORACIC TWELVE;  Surgeon: Vallarie Mare, MD;  Location: [facility];  Service: Neurosurgery;  Laterality: Bilateral;   MANDIBLE SURGERY  1954   WISDOM TOOTH EXTRACTION      Allergies  Allergen Reactions   Actonel [Risedronate] Other (See Comments)    "Allergic," per MAR   Alendronate Sodium Other (See Comments)    Leg cramps and "Allergic," per MAR   Fosamax [Alendronate]    Remeron [Mirtazapine]    Risedronate Sodium Other (See Comments)    Caused the patient to be achy and is "Allergic," per MAR   Tramadol Other (See Comments)    Caused the patient to feel badly    Keflex [Cephalexin] Other (See Comments)    Possible rash     Allergies as of 10/12/2021       Reactions   Actonel [risedronate] Other (See Comments)   "Allergic," per MAR   Alendronate Sodium Other (See Comments)   Leg cramps and "Allergic," per MAR   Fosamax [alendronate]    Remeron [mirtazapine]    Risedronate Sodium Other (See Comments)   Caused the patient to be achy and is "Allergic," per MAR   Tramadol Other (See Comments)   Caused the patient to feel badly    Keflex [cephalexin] Other (See Comments)   Possible rash        Medication List        Accurate as of October 12, 2021 11:59 PM. If you have any questions, ask your nurse or doctor.          acetaminophen 500 MG tablet Commonly known as: TYLENOL Take 1,000 mg by mouth every 8 (eight) hours as needed for moderate pain, mild pain, headache or fever.   acetaminophen 325 MG tablet Commonly known as: TYLENOL Take 650 mg by mouth in the morning and at bedtime.   ASPIRIN 81 PO Take 1 tablet by mouth daily.   Blue-Emu Super Strength Crea Apply 4 oz topically. Apply thin layer to the back.   CALCIUM-CARB 600 PO Take 1 tablet by mouth 2 (two) times daily.   Camphor-Menthol-Methyl Sal 3.02-13-08 % Ptch Apply 1 patch topically daily as needed.   cetirizine 10 MG tablet Commonly known as: ZyrTEC Allergy Take 1 tablet (10 mg total) by mouth daily.   divalproex 125 MG capsule Commonly known as: DEPAKOTE SPRINKLE Take 125 mg by mouth 2 (two) times daily.   docusate sodium 100 MG capsule Commonly known as: COLACE Take 100 mg by mouth at bedtime.   FLUoxetine 20 MG capsule Commonly known as: PROZAC Take 1 capsule (20 mg total) by mouth at bedtime.   gabapentin 100 MG capsule Commonly known as: NEURONTIN Take 1 capsule (100 mg total) by mouth 3 (three) times daily.   melatonin 5 MG Tabs Take 5 mg by mouth at bedtime.   Nizoral A-D 1 % Sham Generic drug: KETOCONAZOLE (TOPICAL) Apply 1 application. topically once a week. Monday   OLANZapine 7.5 MG  tablet  Commonly known as: ZYPREXA Take 7.5 mg by mouth at bedtime.   polyethylene glycol 17 g packet Commonly known as: MIRALAX / GLYCOLAX Take 17 g by mouth daily.   PRESERVISION AREDS 2 PO Take 1 capsule by mouth 2 (two) times daily.   senna-docusate 8.6-50 MG tablet Commonly known as: Senokot-S Take 2 tablets by mouth daily.   sodium phosphate 7-19 GM/118ML Enem Place 1 enema rectally daily as needed for severe constipation or mild constipation.   VITAMIN B12-FOLIC ACID PO Place 1 lozenge under the tongue in the morning.   Vitamin D3 50 MCG (2000 UT) capsule Take 1 capsule (2,000 Units total) by mouth daily.        Review of Systems  Constitutional:  Negative for activity change, appetite change and fever.  HENT:  Positive for hearing loss. Negative for congestion and voice change.   Eyes:  Negative for visual disturbance.  Respiratory:  Negative for cough and shortness of breath.   Cardiovascular:  Negative for leg swelling.  Gastrointestinal:  Negative for abdominal pain, constipation, nausea and vomiting.  Genitourinary:  Negative for dysuria and urgency.  Musculoskeletal:  Positive for arthralgias, back pain and gait problem.  Skin:  Negative for color change.  Neurological:  Negative for speech difficulty, weakness and headaches.       Dementia.   Psychiatric/Behavioral:  Positive for dysphoric mood and sleep disturbance. Negative for confusion and hallucinations. The patient is nervous/anxious.     Immunization History  Administered Date(s) Administered   DT (Pediatric) 04/10/2002   Fluad Quad(high Dose 65+) 10/28/2019   Influenza Split 03/08/2011, 11/17/2011   Influenza Whole 01/09/2007, 11/05/2008, 11/04/2009   Influenza, High Dose Seasonal PF 12/07/2015, 09/29/2016, 11/09/2017, 09/27/2018, 10/28/2019   Influenza,trivalent, recombinat, inj, PF 03/08/2011, 11/17/2011   Influenza-Unspecified 10/30/2012, 11/29/2013, 12/02/2014, 11/25/2020   Moderna  Sars-Covid-2 Vaccination 02/08/2019, 03/11/2019   PFIZER(Purple Top)SARS-COV-2 Vaccination 10/09/2019   PPD Test 12/22/2015, 01/05/2016   Pneumococcal Conjugate-13 07/17/2014   Pneumococcal Polysaccharide-23 08/15/2005, 09/25/2015   Tetanus 12/24/2012   Unspecified SARS-COV-2 Vaccination 02/11/2019, 12/17/2019, 10/27/2020   Zoster Recombinat (Shingrix) 06/07/2019, 09/06/2019   Zoster, Live 12/05/2013   Pertinent  Health Maintenance Due  Topic Date Due   INFLUENZA VACCINE  09/07/2021   DEXA SCAN  Completed      01/11/2021    8:09 PM 01/12/2021    9:15 AM 01/12/2021   10:01 PM 01/13/2021    8:14 AM 06/30/2021    3:06 PM  [place] in the past year?     0  Was there an injury with Fall?     0  Fall Risk Category Calculator     0  Fall Risk Category     Low  Patient Fall Risk Level     High fall risk  Patient at Risk for Falls Due to     Impaired mobility;Impaired balance/gait;History of fall(s)     Information is confidential and restricted. Go to Review Flowsheets to unlock data.   Functional Status Survey:    Vitals:   10/12/21 0910  BP: 124/83  Pulse: 78  Resp: 20  Temp: (!) 97.2 F (36.2 C)  SpO2: 97%  Weight: 82 lb 14.4 oz (37.6 kg)  Height: '5\' 4"'$  (1.626 m)   Body mass index is 14.23 kg/m. Physical Exam Vitals and nursing note reviewed.  Constitutional:      Appearance: Normal appearance.  HENT:     Head: Normocephalic.     Nose: Nose normal.  Mouth/Throat:     Mouth: Mucous membranes are moist.  Eyes:     General:        Right eye: No discharge.        Left eye: No discharge.  Cardiovascular:     Rate and Rhythm: Normal rate.     Heart sounds: No murmur heard. Pulmonary:     Effort: Pulmonary effort is normal.     Breath sounds: No rales.  Abdominal:     General: Bowel sounds are normal.     Palpations: Abdomen is soft.     Tenderness: There is no abdominal tenderness.  Musculoskeletal:     Cervical back: Normal range of motion and neck  supple.     Right lower leg: No edema.     Left lower leg: No edema.     Comments: Right hip s/p ORIF 09/08/20, healed.   Skin:    General: Skin is warm and dry.  Neurological:     General: No focal deficit present.     Mental Status: She is alert and oriented to person, place, and time. Mental status is at baseline.     Motor: No weakness.     Coordination: Coordination normal.     Gait: Gait abnormal.     Comments: Ambulates with walker.   Psychiatric:        Mood and Affect: Mood normal.        Behavior: Behavior normal.     Comments: Confused.      Labs reviewed: Recent Labs    12/27/20 1410 02/25/21 1533 06/01/21 0000 06/30/21 1605 07/22/21 0000  NA 140 139 140 137 138  K 4.1 4.5 4.2 4.4 3.9  CL 105 101 101 98 101  CO2 31 31 30* 31 32*  GLUCOSE 102* 89  --  91  --   BUN 29* '19 17 23 17  '$ CREATININE 0.52 0.74 0.6 0.68 0.7  CALCIUM 8.8* 9.9 9.2 10.1 8.8   Recent Labs    12/27/20 1410 02/25/21 1533 06/01/21 0000 06/30/21 1605 07/22/21 0000  AST '17 21 23 25 21  '$ ALT '15 13 17 20 16  '$ ALKPHOS 53 61 67 62  --   BILITOT 0.4 0.4  --  0.4  --   PROT 6.0* 7.3  --  7.6  --   ALBUMIN 3.3* 4.1 4.0 4.3 3.7   Recent Labs    12/27/20 1410 06/01/21 0000 06/30/21 1605 07/22/21 0000  WBC 4.3 5.5 6.5 4.6  NEUTROABS 2.3 3,031.00 4.1  --   HGB 12.0 13.8 13.3 12.5  HCT 36.8 43 40.8 38  MCV 95.8  --  92.8  --   PLT 158 180 199.0 206   Lab Results  Component Value Date   TSH 2.25 06/30/2021   Lab Results  Component Value Date   HGBA1C 5.5 01/06/2021   Lab Results  Component Value Date   CHOL 182 01/06/2021   HDL 58 01/06/2021   LDLCALC 117 (H) 01/06/2021   LDLDIRECT 135.4 12/26/2008   TRIG 37 01/06/2021   CHOLHDL 3.1 01/06/2021    Significant Diagnostic Results in last 30 days:  No results found.  Assessment/Plan Alzheimer disease ([place]) TSH 2.25, Vit B12 >1504 06/30/21,  SNF [facility] for supportive care. MMSE 27/30 04/08/21, takes Vit B12  Vitamin D  deficiency , takes Vit d daily, Vit D 56.33 06/30/21  Allergic rhinitis Stable,  takes Zyrtec.  Hyperlipidemia diet, LDL 117 01/06/21  Depression with suicidal ideation  hx of  suicidal attempt/ideations, anxious at times,  on Prozac, Zyprexa, Depakote, Mirtazapine made her AMS  Osteoarthritis, multiple sites stable, T11, L1 compression fx, takes Tylenol, Gabapentin  Failure to thrive in adult Weight loss/adult failure to thrive, weight is stable, didn't tolerate Mirtazapine.  Thrombocytopenia ([place]) plt 206 07/22/21     Family/ staff Communication: plan of care reviewed with the patient and charge nurse.   Labs/tests ordered:  none  Time spend 25 minutes.

## 2021-10-12 NOTE — Assessment & Plan Note (Signed)
plt 206 07/22/21 

## 2021-10-12 NOTE — Assessment & Plan Note (Signed)
stable, T11, L1 compression fx, takes Tylenol, Gabapentin

## 2021-10-12 NOTE — Assessment & Plan Note (Signed)
Stable,  takes Zyrtec.

## 2021-10-12 NOTE — Assessment & Plan Note (Signed)
,   takes Vit d daily, Vit D 56.33 06/30/21

## 2021-10-12 NOTE — Assessment & Plan Note (Addendum)
TSH 2.25, Vit B12 >1504 06/30/21,  SNF [facility] for supportive care. MMSE 27/30 04/08/21, takes Vit B12

## 2021-10-12 NOTE — Assessment & Plan Note (Signed)
Weight loss/adult failure to thrive, weight is stable, didn't tolerate Mirtazapine.

## 2021-10-12 NOTE — Assessment & Plan Note (Signed)
hx of suicidal attempt/ideations, anxious at times,  on Prozac, Zyprexa, Depakote, Mirtazapine made her AMS 

## 2021-10-12 NOTE — Assessment & Plan Note (Signed)
diet, LDL 117 01/06/21 

## 2021-10-25 ENCOUNTER — Encounter: Payer: Self-pay | Admitting: Nurse Practitioner

## 2021-10-25 ENCOUNTER — Non-Acute Institutional Stay (SKILLED_NURSING_FACILITY): Payer: Medicare Other | Admitting: Nurse Practitioner

## 2021-10-25 DIAGNOSIS — M159 Polyosteoarthritis, unspecified: Secondary | ICD-10-CM

## 2021-10-25 DIAGNOSIS — S51812A Laceration without foreign body of left forearm, initial encounter: Secondary | ICD-10-CM | POA: Diagnosis not present

## 2021-10-25 DIAGNOSIS — R45851 Suicidal ideations: Secondary | ICD-10-CM

## 2021-10-25 DIAGNOSIS — F0280 Dementia in other diseases classified elsewhere without behavioral disturbance: Secondary | ICD-10-CM | POA: Diagnosis not present

## 2021-10-25 DIAGNOSIS — K5901 Slow transit constipation: Secondary | ICD-10-CM | POA: Diagnosis not present

## 2021-10-25 DIAGNOSIS — E538 Deficiency of other specified B group vitamins: Secondary | ICD-10-CM | POA: Diagnosis not present

## 2021-10-25 DIAGNOSIS — E785 Hyperlipidemia, unspecified: Secondary | ICD-10-CM

## 2021-10-25 DIAGNOSIS — D500 Iron deficiency anemia secondary to blood loss (chronic): Secondary | ICD-10-CM

## 2021-10-25 DIAGNOSIS — E559 Vitamin D deficiency, unspecified: Secondary | ICD-10-CM | POA: Diagnosis not present

## 2021-10-25 DIAGNOSIS — G309 Alzheimer's disease, unspecified: Secondary | ICD-10-CM

## 2021-10-25 DIAGNOSIS — I10 Essential (primary) hypertension: Secondary | ICD-10-CM

## 2021-10-25 DIAGNOSIS — F32A Depression, unspecified: Secondary | ICD-10-CM

## 2021-10-25 DIAGNOSIS — J3089 Other allergic rhinitis: Secondary | ICD-10-CM | POA: Diagnosis not present

## 2021-10-25 NOTE — Assessment & Plan Note (Signed)
hx of suicidal attempt/ideations, anxious at times,  on Prozac, Zyprexa, Depakote, Mirtazapine made her AMS 

## 2021-10-25 NOTE — Progress Notes (Signed)
Location:   SNF [facility] Room Number: 30 Place of Service:  SNF (31) Provider: Lennie Odor Rebeckah Masih NP  Plotnikov, [name], MD  Patient Care Team: Cassandria Anger, MD as PCP - General (Internal Medicine)  Extended Emergency Contact Information Primary Emergency Contact: Burman Blacksmith Address: [address]          [place], [place] [address] [place] Phone: [phone] Relation: Spouse Secondary Emergency Contact: [facility] Phone: [phone] Relation: Daughter  Code Status: DNR Goals of care: Advanced Directive information    10/12/2021    9:24 AM  Advanced Directives  Does Patient Have a Medical Advance Directive? Yes  Type of Paramedic of [place];Living will  Does patient want to make changes to medical advance directive? No - Patient declined  Copy of [place] in Chart? Yes - validated most recent copy scanned in chart (See row information)     Chief Complaint  Patient presents with  . Acute Visit    fall    HPI:  Pt is a 81 y.o. female seen today for an acute visit for fall 10/24/21 when the patient was found on the floor, resulted skin tear left fore arm,  steri strips closure intact, no s/s of active bleeding or infection.     OP, never received Evenity, taking Vit D, Ca. pending repeated DEXA, may consider Prolia.              Anemia, Hgb Iron 58, Hgb 13.3 06/30/21             HTN, off Amlodipine. Bun/creat 17/0.7 07/22/21             Constipation, takes Senokot S, MiraLax.              Alzheimer's Dementia, TSH 2.25, Vit B12 >1504 06/30/21,  SNF [facility] for supportive care. MMSE 27/30 04/08/21             Vit B12 deficiency, Vit B12 level >1504 06/30/21, takes Vit B12             Vitamin D deficiency, takes Vit d daily, Vit D 56.33 06/30/21             Allergic rhinitis, takes Zyrtec.             Hyperlipidemia, diet, LDL 117 01/06/21             Depression, hx of suicidal  attempt/ideations, anxious at times,  on Prozac, Zyprexa, Depakote, Mirtazapine made her AMS             Chronic back pain, stable, T11, L1 compression fx, takes Tylenol, Gabapentin             Weight loss/adult failure to thrive, weight is stable, didn't tolerate Mirtazapine.             Thrombocytopenia, plt 206 07/22/21  Past Medical History:  Diagnosis Date  . Anxiety    no meds  . 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 Depression    years ago  . Headache    sinus headaches  . Lipoma of arm    Right  . Medical history non-contributory   . Osteoporosis   . SVD (spontaneous vaginal delivery)    x 2  . Varicose vein   . Vitamin D deficiency    Past Surgical History:  Procedure Laterality Date  . ANTERIOR APPROACH HEMI HIP ARTHROPLASTY  Right 09/04/2020   Procedure: ANTERIOR APPROACH HEMI HIP ARTHROPLASTY;  Surgeon: Rod Can, MD;  Location: [facility];  Service: Orthopedics;  Laterality: Right;  . APPENDECTOMY  1947  . CHOLECYSTECTOMY  1988  . COLONOSCOPY    . GANGLION CYST EXCISION     x2 left and right arm  . HYSTEROSCOPY WITH D & C N/A 07/15/2013   Procedure: DILATATION AND CURETTAGE /HYSTEROSCOPY;  Surgeon: Lovenia Kim, MD;  Location: [facility];  Service: Gynecology;  Laterality: N/A;  . KYPHOPLASTY Bilateral 05/04/2020   Procedure: KYPHOPLASTY THORACIC TWELVE;  Surgeon: Vallarie Mare, MD;  Location: [facility];  Service: Neurosurgery;  Laterality: Bilateral;  . [place]  . WISDOM TOOTH EXTRACTION      Allergies  Allergen Reactions  . Actonel [Risedronate] Other (See Comments)    "Allergic," per MAR  . Alendronate Sodium Other (See Comments)    Leg cramps and "Allergic," per MAR  . Fosamax [Alendronate]   . Remeron [Mirtazapine]   . Risedronate Sodium Other (See Comments)    Caused the patient to be achy and is "Allergic," per MAR  . Tramadol Other (See Comments)    Caused the patient to feel badly   . Keflex [Cephalexin] Other (See  Comments)    Possible rash    Allergies as of 10/25/2021       Reactions   Actonel [risedronate] Other (See Comments)   "Allergic," per MAR   Alendronate Sodium Other (See Comments)   Leg cramps and "Allergic," per MAR   Fosamax [alendronate]    Remeron [mirtazapine]    Risedronate Sodium Other (See Comments)   Caused the patient to be achy and is "Allergic," per MAR   Tramadol Other (See Comments)   Caused the patient to feel badly    Keflex [cephalexin] Other (See Comments)   Possible rash        Medication List        Accurate as of October 25, 2021  4:35 PM. If you have any questions, ask your nurse or doctor.          acetaminophen 500 MG tablet Commonly known as: TYLENOL Take 1,000 mg by mouth every 8 (eight) hours as needed for moderate pain, mild pain, headache or fever.   acetaminophen 325 MG tablet Commonly known as: TYLENOL Take 650 mg by mouth in the morning and at bedtime.   ASPIRIN 81 PO Take 1 tablet by mouth daily.   Blue-Emu Super Strength Crea Apply 4 oz topically. Apply thin layer to the back.   CALCIUM-CARB 600 PO Take 1 tablet by mouth 2 (two) times daily.   Camphor-Menthol-Methyl Sal 3.02-13-08 % Ptch Apply 1 patch topically daily as needed.   cetirizine 10 MG tablet Commonly known as: ZyrTEC Allergy Take 1 tablet (10 mg total) by mouth daily.   divalproex 125 MG capsule Commonly known as: DEPAKOTE SPRINKLE Take 125 mg by mouth 2 (two) times daily.   docusate sodium 100 MG capsule Commonly known as: COLACE Take 100 mg by mouth at bedtime.   FLUoxetine 20 MG capsule Commonly known as: PROZAC Take 1 capsule (20 mg total) by mouth at bedtime.   gabapentin 100 MG capsule Commonly known as: NEURONTIN Take 1 capsule (100 mg total) by mouth 3 (three) times daily.   melatonin 5 MG Tabs Take 5 mg by mouth at bedtime.   Nizoral A-D 1 % Sham Generic drug: KETOCONAZOLE (TOPICAL) Apply 1 application. topically once a week. Monday  OLANZapine 7.5 MG tablet Commonly known as: ZYPREXA Take 7.5 mg by mouth at bedtime.   polyethylene glycol 17 g packet Commonly known as: MIRALAX / GLYCOLAX Take 17 g by mouth daily.   PRESERVISION AREDS 2 PO Take 1 capsule by mouth 2 (two) times daily.   senna-docusate 8.6-50 MG tablet Commonly known as: Senokot-S Take 2 tablets by mouth daily.   sodium phosphate 7-19 GM/118ML Enem Place 1 enema rectally daily as needed for severe constipation or mild constipation.   VITAMIN B12-FOLIC ACID PO Place 1 lozenge under the tongue in the morning.   Vitamin D3 50 MCG (2000 UT) capsule Take 1 capsule (2,000 Units total) by mouth daily.        Review of Systems  Constitutional:  Negative for activity change, appetite change and fever.  HENT:  Positive for hearing loss. Negative for congestion and voice change.   Eyes:  Negative for visual disturbance.  Respiratory:  Negative for cough and shortness of breath.   Cardiovascular:  Negative for leg swelling.  Gastrointestinal:  Negative for abdominal pain and constipation.  Genitourinary:  Negative for dysuria and urgency.  Musculoskeletal:  Positive for arthralgias, back pain and gait problem.  Skin:  Negative for color change.       Skin tear  Neurological:  Negative for speech difficulty, weakness and headaches.       Dementia.   Psychiatric/Behavioral:  Positive for dysphoric mood and sleep disturbance. Negative for confusion and hallucinations. The patient is nervous/anxious.     Immunization History  Administered Date(s) Administered  . DT (Pediatric) 04/10/2002  . Fluad Quad(high Dose 65+) 10/28/2019  . Influenza Split 03/08/2011, 11/17/2011  . Influenza Whole 01/09/2007, 11/05/2008, 11/04/2009  . Influenza, High Dose Seasonal PF 12/07/2015, 09/29/2016, 11/09/2017, 09/27/2018, 10/28/2019  . Influenza,trivalent, recombinat, inj, PF 03/08/2011, 11/17/2011  . Influenza-Unspecified 10/30/2012, 11/29/2013, 12/02/2014,  11/25/2020  . Moderna Sars-Covid-2 Vaccination 02/08/2019, 03/11/2019  . PFIZER(Purple Top)SARS-COV-2 Vaccination 10/09/2019  . PPD Test 12/22/2015, 01/05/2016  . Pneumococcal Conjugate-13 07/17/2014  . Pneumococcal Polysaccharide-23 08/15/2005, 09/25/2015  . Tetanus 12/24/2012  . Unspecified SARS-COV-2 Vaccination 02/11/2019, 12/17/2019, 10/27/2020  . Zoster Recombinat (Shingrix) 06/07/2019, 09/06/2019  . Zoster, Live 12/05/2013   Pertinent  Health Maintenance Due  Topic Date Due  . INFLUENZA VACCINE  09/07/2021  . DEXA SCAN  Completed      01/11/2021    8:09 PM 01/12/2021    9:15 AM 01/12/2021   10:01 PM 01/13/2021    8:14 AM 06/30/2021    3:06 PM  Fall Risk  Falls in the past year?     0  Was there an injury with Fall?     0  Fall Risk Category Calculator     0  Fall Risk Category     Low  Patient Fall Risk Level     High fall risk  Patient at Risk for Falls Due to     Impaired mobility;Impaired balance/gait;History of fall(s)     Information is confidential and restricted. Go to Review Flowsheets to unlock data.   Functional Status Survey:    Vitals:   10/25/21 1546  BP: 102/70  Pulse: 78  Resp: 18  Temp: 98 F (36.7 C)  SpO2: 96%   There is no height or weight on file to calculate BMI. Physical Exam Vitals and nursing note reviewed.  Constitutional:      Appearance: Normal appearance.  HENT:     Head: Normocephalic.     Nose: Nose normal.  Mouth/Throat:     Mouth: Mucous membranes are moist.  Eyes:     General:        Right eye: No discharge.        Left eye: No discharge.  Cardiovascular:     Rate and Rhythm: Normal rate.     Heart sounds: No murmur heard. Pulmonary:     Effort: Pulmonary effort is normal.     Breath sounds: No rales.  Abdominal:     General: Bowel sounds are normal.     Palpations: Abdomen is soft.     Tenderness: There is no abdominal tenderness.  Musculoskeletal:     Cervical back: Normal range of motion and neck supple.      Right lower leg: No edema.     Left lower leg: No edema.     Comments: Right hip s/p ORIF 09/08/20, healed.   Skin:    General: Skin is warm and dry.     Comments: Skin tear, left forearm, is well approximate with steri strips, no s/s of active bleeding or infection.   Neurological:     General: No focal deficit present.     Mental Status: She is alert and oriented to person, place, and time. Mental status is at baseline.     Motor: No weakness.     Coordination: Coordination normal.     Gait: Gait abnormal.     Comments: Ambulates with walker.   Psychiatric:        Mood and Affect: Mood normal.        Behavior: Behavior normal.     Comments: Confused.     Labs reviewed: Recent Labs    12/27/20 1410 02/25/21 1533 06/01/21 0000 06/30/21 1605 07/22/21 0000  NA 140 139 140 137 138  K 4.1 4.5 4.2 4.4 3.9  CL 105 101 101 98 101  CO2 31 31 30* 31 32*  GLUCOSE 102* 89  --  91  --   BUN 29* '19 17 23 17  '$ CREATININE 0.52 0.74 0.6 0.68 0.7  CALCIUM 8.8* 9.9 9.2 10.1 8.8   Recent Labs    12/27/20 1410 02/25/21 1533 06/01/21 0000 06/30/21 1605 07/22/21 0000  AST '17 21 23 25 21  '$ ALT '15 13 17 20 16  '$ ALKPHOS 53 61 67 62  --   BILITOT 0.4 0.4  --  0.4  --   PROT 6.0* 7.3  --  7.6  --   ALBUMIN 3.3* 4.1 4.0 4.3 3.7   Recent Labs    12/27/20 1410 06/01/21 0000 06/30/21 1605 07/22/21 0000  WBC 4.3 5.5 6.5 4.6  NEUTROABS 2.3 3,031.00 4.1  --   HGB 12.0 13.8 13.3 12.5  HCT 36.8 43 40.8 38  MCV 95.8  --  92.8  --   PLT 158 180 199.0 206   Lab Results  Component Value Date   TSH 2.25 06/30/2021   Lab Results  Component Value Date   HGBA1C 5.5 01/06/2021   Lab Results  Component Value Date   CHOL 182 01/06/2021   HDL 58 01/06/2021   LDLCALC 117 (H) 01/06/2021   LDLDIRECT 135.4 12/26/2008   TRIG 37 01/06/2021   CHOLHDL 3.1 01/06/2021    Significant Diagnostic Results in last 30 days:  No results found.  Assessment/Plan: Skin tear of left forearm without  complication fall 7/82/95 when the patient was found on the floor, resulted skin tear, steri strips closure intact, no s/s of active bleeding or infection.  Blood loss anemia Hgb Iron 58, Hgb 13.3 06/30/21  HTN (hypertension) Blood pressure is controlled, off Amlodipine. Bun/creat 17/0.7 07/22/21  Slow transit constipation Stable, takes Senokot S, MiraLax.   Alzheimer disease ([facility]) TSH 2.25, Vit B12 >1504 06/30/21,  SNF [facility] for supportive care. MMSE 27/30 04/08/21  Vitamin B12 deficiency Vit B12 level >1504 06/30/21, takes Vit B12  Vitamin D deficiency takes Vit d daily, Vit D 56.33 06/30/21  Allergic rhinitis Stable,  takes Zyrtec.  Hyperlipidemia diet, LDL 117 01/06/21  Depression with suicidal ideation  hx of suicidal attempt/ideations, anxious at times,  on Prozac, Zyprexa, Depakote, Mirtazapine made her AMS  Osteoarthritis, multiple sites stable, T11, L1 compression fx, takes Tylenol, Gabapentin, ambulates with walker    Family/ staff Communication: plan of care reviewed with the patient and charge nurse.   Labs/tests ordered:  none  Time spend 25 minutes.

## 2021-10-25 NOTE — Assessment & Plan Note (Signed)
Stable,  takes Zyrtec.

## 2021-10-25 NOTE — Assessment & Plan Note (Signed)
TSH 2.25, Vit B12 >1504 06/30/21,  SNF [facility] for supportive care. MMSE 27/30 04/08/21 

## 2021-10-25 NOTE — Assessment & Plan Note (Signed)
diet, LDL 117 01/06/21 

## 2021-10-25 NOTE — Assessment & Plan Note (Signed)
fall 10/24/21 when the patient was found on the floor, resulted skin tear, steri strips closure intact, no s/s of active bleeding or infection.

## 2021-10-25 NOTE — Assessment & Plan Note (Signed)
Blood pressure is controlled, off Amlodipine. Bun/creat 17/0.7 07/22/21 

## 2021-10-25 NOTE — Assessment & Plan Note (Signed)
Stable, takes Senokot S, MiraLax. 

## 2021-10-25 NOTE — Assessment & Plan Note (Signed)
takes Vit d daily, Vit D 56.33 06/30/21 

## 2021-10-25 NOTE — Assessment & Plan Note (Signed)
Hgb Iron 58, Hgb 13.3 06/30/21

## 2021-10-25 NOTE — Assessment & Plan Note (Signed)
stable, T11, L1 compression fx, takes Tylenol, Gabapentin, ambulates with walker

## 2021-10-25 NOTE — Assessment & Plan Note (Signed)
Vit B12 level >1504 06/30/21, takes Vit B12 

## 2021-11-01 DIAGNOSIS — F411 Generalized anxiety disorder: Secondary | ICD-10-CM | POA: Diagnosis not present

## 2021-11-01 DIAGNOSIS — F320 Major depressive disorder, single episode, mild: Secondary | ICD-10-CM | POA: Diagnosis not present

## 2021-11-02 DIAGNOSIS — F411 Generalized anxiety disorder: Secondary | ICD-10-CM | POA: Diagnosis not present

## 2021-11-02 DIAGNOSIS — F332 Major depressive disorder, recurrent severe without psychotic features: Secondary | ICD-10-CM | POA: Diagnosis not present

## 2021-11-02 DIAGNOSIS — G4700 Insomnia, unspecified: Secondary | ICD-10-CM | POA: Diagnosis not present

## 2021-11-02 DIAGNOSIS — F03A3 Unspecified dementia, mild, with mood disturbance: Secondary | ICD-10-CM | POA: Diagnosis not present

## 2021-11-02 DIAGNOSIS — R2681 Unsteadiness on feet: Secondary | ICD-10-CM | POA: Diagnosis not present

## 2021-11-02 DIAGNOSIS — M6281 Muscle weakness (generalized): Secondary | ICD-10-CM | POA: Diagnosis not present

## 2021-11-02 DIAGNOSIS — Z9181 History of falling: Secondary | ICD-10-CM | POA: Diagnosis not present

## 2021-11-03 DIAGNOSIS — R2681 Unsteadiness on feet: Secondary | ICD-10-CM | POA: Diagnosis not present

## 2021-11-03 DIAGNOSIS — Z9181 History of falling: Secondary | ICD-10-CM | POA: Diagnosis not present

## 2021-11-03 DIAGNOSIS — M6281 Muscle weakness (generalized): Secondary | ICD-10-CM | POA: Diagnosis not present

## 2021-11-04 NOTE — Telephone Encounter (Addendum)
[facility] is calling in stating they do not understand why this is taking so long. Wanted to speak with a manager or someone directly, advised they were busy and would have someone reach out to them when they are available. Asking for a call back from the manager.

## 2021-11-04 NOTE — Telephone Encounter (Signed)
Rec'd another fax from [employer] supplies. Place on MD desk.Marland KitchenJohny Chess

## 2021-11-05 NOTE — Telephone Encounter (Signed)
I would request that Tanya Walker goes through Dr. Lyndel Safe with these requests since Dr. Lyndel Safe is [name]'s attending at [facility] memory care unit. Thanks

## 2021-11-09 ENCOUNTER — Non-Acute Institutional Stay (SKILLED_NURSING_FACILITY): Payer: Medicare Other | Admitting: Nurse Practitioner

## 2021-11-09 ENCOUNTER — Encounter: Payer: Self-pay | Admitting: Nurse Practitioner

## 2021-11-09 DIAGNOSIS — M159 Polyosteoarthritis, unspecified: Secondary | ICD-10-CM

## 2021-11-09 DIAGNOSIS — D500 Iron deficiency anemia secondary to blood loss (chronic): Secondary | ICD-10-CM | POA: Diagnosis not present

## 2021-11-09 DIAGNOSIS — D696 Thrombocytopenia, unspecified: Secondary | ICD-10-CM | POA: Diagnosis not present

## 2021-11-09 DIAGNOSIS — R2681 Unsteadiness on feet: Secondary | ICD-10-CM | POA: Diagnosis not present

## 2021-11-09 DIAGNOSIS — M6281 Muscle weakness (generalized): Secondary | ICD-10-CM | POA: Diagnosis not present

## 2021-11-09 DIAGNOSIS — E538 Deficiency of other specified B group vitamins: Secondary | ICD-10-CM | POA: Diagnosis not present

## 2021-11-09 DIAGNOSIS — M810 Age-related osteoporosis without current pathological fracture: Secondary | ICD-10-CM

## 2021-11-09 DIAGNOSIS — K5901 Slow transit constipation: Secondary | ICD-10-CM | POA: Diagnosis not present

## 2021-11-09 DIAGNOSIS — J3089 Other allergic rhinitis: Secondary | ICD-10-CM | POA: Diagnosis not present

## 2021-11-09 DIAGNOSIS — E785 Hyperlipidemia, unspecified: Secondary | ICD-10-CM

## 2021-11-09 DIAGNOSIS — I10 Essential (primary) hypertension: Secondary | ICD-10-CM | POA: Diagnosis not present

## 2021-11-09 DIAGNOSIS — F32A Depression, unspecified: Secondary | ICD-10-CM

## 2021-11-09 DIAGNOSIS — G309 Alzheimer's disease, unspecified: Secondary | ICD-10-CM

## 2021-11-09 DIAGNOSIS — R627 Adult failure to thrive: Secondary | ICD-10-CM

## 2021-11-09 DIAGNOSIS — E559 Vitamin D deficiency, unspecified: Secondary | ICD-10-CM | POA: Diagnosis not present

## 2021-11-09 DIAGNOSIS — R41841 Cognitive communication deficit: Secondary | ICD-10-CM | POA: Diagnosis not present

## 2021-11-09 DIAGNOSIS — F0280 Dementia in other diseases classified elsewhere without behavioral disturbance: Secondary | ICD-10-CM

## 2021-11-09 DIAGNOSIS — R45851 Suicidal ideations: Secondary | ICD-10-CM

## 2021-11-09 NOTE — Assessment & Plan Note (Signed)
takes Vit d daily, Vit D 56.33 06/30/21 

## 2021-11-09 NOTE — Assessment & Plan Note (Signed)
Vit B12 level >1504 06/30/21, takes Vit B12 

## 2021-11-09 NOTE — Assessment & Plan Note (Signed)
takes Zyrtec. 

## 2021-11-09 NOTE — Assessment & Plan Note (Signed)
Stable, takes Senokot S, MiraLax. 

## 2021-11-09 NOTE — Assessment & Plan Note (Signed)
diet, LDL 117 01/06/21 

## 2021-11-09 NOTE — Assessment & Plan Note (Signed)
never received Evenity, taking Vit D, Ca. pending repeated DEXA, may consider Prolia.  

## 2021-11-09 NOTE — Telephone Encounter (Signed)
Faxed form back to [facility] w/ MD response.Marland KitchenJohny Walker

## 2021-11-09 NOTE — Assessment & Plan Note (Signed)
hx of suicidal attempt/ideations, anxious at times,  on Prozac, Zyprexa, Depakote, Mirtazapine made her AMS 

## 2021-11-09 NOTE — Assessment & Plan Note (Signed)
plt 206 07/22/21 

## 2021-11-09 NOTE — Assessment & Plan Note (Signed)
gradual weight loss, didn't tolerate Mirtazapine.

## 2021-11-09 NOTE — Assessment & Plan Note (Signed)
Blood pressure is controlled, off Amlodipine. Bun/creat 17/0.7 07/22/21 

## 2021-11-09 NOTE — Progress Notes (Signed)
Location:  [facility] Room Number: NO/30/A Place of Service:  SNF (31) Provider:  Graycen Sadlon X, NP  Patient Care Team: Kamar Callender X, NP as PCP - General (Internal Medicine)  Extended Emergency Contact Information Primary Emergency Contact: Kalle, Bernath Address: [address]          [place], [place] [address] [place] Phone: [phone] Relation: Spouse Secondary Emergency Contact: [facility] Phone: [phone] Relation: Daughter  Code Status:  FULL Goals of care: Advanced Directive information    11/09/2021   11:37 AM  Advanced Directives  Does Patient Have a Medical Advance Directive? Yes  Type of Paramedic of [place];Living will  Does patient want to make changes to medical advance directive? No - Patient declined  Copy of [place] in Chart? Yes - validated most recent copy scanned in chart (See row information)     Chief Complaint  Patient presents with   Medical Management of Chronic Issues    Patient is here for a follow up for chronic conditions    Quality Metric Gaps    Patient is also due for covid booster and flu vaccination    HPI:  Pt is a 81 y.o. female seen today for medical management of chronic diseases.      OP, never received Evenity, taking Vit D, Ca. pending repeated DEXA, may consider Prolia.              Anemia, Hgb Iron 58, Hgb 13.3 06/30/21             HTN, off Amlodipine. Bun/creat 17/0.7 07/22/21             Constipation, takes Senokot S, MiraLax.              Alzheimer's Dementia, TSH 2.25, Vit B12 >1504 06/30/21,  SNF [facility] for supportive care. MMSE 27/30 04/08/21             Vit B12 deficiency, Vit B12 level >1504 06/30/21, takes Vit B12             Vitamin D deficiency, takes Vit d daily, Vit D 56.33 06/30/21             Allergic rhinitis, takes Zyrtec.             Hyperlipidemia, diet, LDL 117 01/06/21             Depression, hx of suicidal  attempt/ideations, anxious at times,  on Prozac, Zyprexa, Depakote, Mirtazapine made her AMS             Chronic back pain, stable, T11, T12, L1 compression fx, s/p T12 Kyphoplasty, takes Tylenol, Gabapentin             Weight loss/adult failure to thrive, gradual weight loss, didn't tolerate Mirtazapine.             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   Depression    years ago   Headache    sinus headaches   Lipoma of arm    Right   Medical history non-contributory    Osteoporosis    SVD (spontaneous vaginal delivery)    x 2   Varicose vein    Vitamin D deficiency    Past Surgical History:  Procedure Laterality Date  ANTERIOR APPROACH HEMI HIP ARTHROPLASTY Right 09/04/2020   Procedure: ANTERIOR APPROACH HEMI HIP ARTHROPLASTY;  Surgeon: Rod Can, MD;  Location: [facility];  Service: Orthopedics;  Laterality: Right;   [place]   COLONOSCOPY     GANGLION CYST EXCISION     x2 left and right arm   HYSTEROSCOPY WITH D & C N/A 07/15/2013   Procedure: DILATATION AND CURETTAGE /HYSTEROSCOPY;  Surgeon: Lovenia Kim, MD;  Location: [facility];  Service: Gynecology;  Laterality: N/A;   KYPHOPLASTY Bilateral 05/04/2020   Procedure: KYPHOPLASTY THORACIC TWELVE;  Surgeon: Vallarie Mare, MD;  Location: [place];  Service: Neurosurgery;  Laterality: Bilateral;   MANDIBLE SURGERY  1954   WISDOM TOOTH EXTRACTION      Allergies  Allergen Reactions   Actonel [Risedronate] Other (See Comments)    "Allergic," per MAR   Alendronate Sodium Other (See Comments)    Leg cramps and "Allergic," per MAR   Fosamax [Alendronate]    Remeron [Mirtazapine]    Risedronate Sodium Other (See Comments)    Caused the patient to be achy and is "Allergic," per MAR   Tramadol Other (See Comments)    Caused the patient to feel badly    Keflex [Cephalexin] Other (See  Comments)    Possible rash    Outpatient Encounter Medications as of 11/09/2021  Medication Sig   acetaminophen (TYLENOL) 325 MG tablet Take 650 mg by mouth in the morning and at bedtime.   acetaminophen (TYLENOL) 500 MG tablet Take 1,000 mg by mouth every 8 (eight) hours as needed for moderate pain, mild pain, headache or fever.   ASPIRIN 81 PO Take 1 tablet by mouth daily.   Calcium Carbonate (CALCIUM-CARB 600 PO) Take 1 tablet by mouth 2 (two) times daily.   Camphor-Menthol-Methyl Sal 3.02-13-08 % PTCH Apply 1 patch topically daily as needed.   cetirizine (ZYRTEC ALLERGY) 10 MG tablet Take 1 tablet (10 mg total) by mouth daily.   Cholecalciferol (VITAMIN D3) 2000 units capsule Take 1 capsule (2,000 Units total) by mouth daily.   Cobalamin Combinations (VITAMIN B12-FOLIC ACID PO) Place 1 lozenge under the tongue in the morning.   divalproex (DEPAKOTE SPRINKLE) 125 MG capsule Take 125 mg by mouth 2 (two) times daily.   docusate sodium (COLACE) 100 MG capsule Take 100 mg by mouth at bedtime.   FLUoxetine (PROZAC) 20 MG capsule Take 1 capsule (20 mg total) by mouth at bedtime.   gabapentin (NEURONTIN) 100 MG capsule Take 1 capsule (100 mg total) by mouth 3 (three) times daily.   KETOCONAZOLE, TOPICAL, (NIZORAL A-D) 1 % SHAM Apply 1 application. topically once a week. Monday   Liniments (BLUE-EMU SUPER STRENGTH) CREA Apply 4 oz topically. Apply thin layer to the back.   melatonin 5 MG TABS Take 5 mg by mouth at bedtime.   Multiple Vitamins-Minerals (PRESERVISION AREDS 2 PO) Take 1 capsule by mouth 2 (two) times daily.   OLANZapine (ZYPREXA) 7.5 MG tablet Take 7.5 mg by mouth at bedtime.   polyethylene glycol (MIRALAX / GLYCOLAX) 17 g packet Take 17 g by mouth daily.   senna-docusate (SENOKOT-S) 8.6-50 MG tablet Take 2 tablets by mouth daily.   sodium phosphate (FLEET) 7-19 GM/118ML ENEM Place 1 enema rectally daily as needed for severe constipation or mild constipation.   No  facility-administered encounter medications on file as of 11/09/2021.    Review of Systems  Constitutional:  Negative for activity change, appetite change and fever.  HENT:  Positive for hearing loss. Negative for congestion and voice change.   Eyes:  Negative for visual disturbance.  Respiratory:  Negative for cough and shortness of breath.   Cardiovascular:  Negative for leg swelling.  Gastrointestinal:  Negative for abdominal pain and constipation.  Genitourinary:  Negative for dysuria and urgency.  Musculoskeletal:  Positive for arthralgias, back pain and gait problem.  Skin:  Negative for color change.  Neurological:  Negative for speech difficulty, weakness and headaches.       Dementia.   Psychiatric/Behavioral:  Positive for dysphoric mood and sleep disturbance. Negative for confusion and hallucinations. The patient is not nervous/anxious.     Immunization History  Administered Date(s) Administered   DT (Pediatric) 04/10/2002   Fluad Quad(high Dose 65+) 10/28/2019   Influenza Split 03/08/2011, 11/17/2011   Influenza Whole 01/09/2007, 11/05/2008, 11/04/2009   Influenza, High Dose Seasonal PF 12/07/2015, 09/29/2016, 11/09/2017, 09/27/2018, 10/28/2019   Influenza,trivalent, recombinat, inj, PF 03/08/2011, 11/17/2011   Influenza-Unspecified 10/30/2012, 11/29/2013, 12/02/2014, 11/25/2020   Moderna Sars-Covid-2 Vaccination 02/08/2019, 03/11/2019   PFIZER(Purple Top)SARS-COV-2 Vaccination 10/09/2019   PPD Test 12/22/2015, 01/05/2016   Pneumococcal Conjugate-13 07/17/2014   Pneumococcal Polysaccharide-23 08/15/2005, 09/25/2015   Tetanus 12/24/2012   Unspecified SARS-COV-2 Vaccination 02/11/2019, 12/17/2019, 10/27/2020   Zoster Recombinat (Shingrix) 06/07/2019, 09/06/2019   Zoster, Live 12/05/2013   Pertinent  Health Maintenance Due  Topic Date Due   INFLUENZA VACCINE  09/07/2021   DEXA SCAN  Completed      01/12/2021    9:15 AM 01/12/2021   10:01 PM 01/13/2021    8:14 AM  06/30/2021    3:06 PM 11/09/2021   11:37 AM  Fall Risk  Falls in the past year?    0 0  Was there an injury with Fall?    0 0  Fall Risk Category Calculator    0 0  Fall Risk Category    Low Low  Patient Fall Risk Level    High fall risk Low fall risk  Patient at Risk for Falls Due to    Impaired mobility;Impaired balance/gait;History of fall(s) No Fall Risks  Fall risk Follow up     Falls evaluation completed     Information is confidential and restricted. Go to Review Flowsheets to unlock data.   Functional Status Survey:    Vitals:   11/09/21 1139  BP: 121/60  Pulse: 69  Resp: 18  Temp: 97.9 F (36.6 C)  SpO2: 95%  Weight: 81 lb 12.8 oz (37.1 kg)  Height: '5\' 4"'$  (1.626 m)   Body mass index is 14.04 kg/m. Physical Exam Vitals and nursing note reviewed.  Constitutional:      Appearance: Normal appearance.  HENT:     Head: Normocephalic.     Nose: Nose normal.     Mouth/Throat:     Mouth: Mucous membranes are moist.  Eyes:     General:        Right eye: No discharge.        Left eye: No discharge.  Cardiovascular:     Rate and Rhythm: Normal rate.     Heart sounds: No murmur heard. Pulmonary:     Effort: Pulmonary effort is normal.     Breath sounds: No rales.  Abdominal:     General: Bowel sounds are normal.     Palpations: Abdomen is soft.     Tenderness: There is no abdominal tenderness.  Musculoskeletal:     Cervical back: Normal range of motion and neck supple.  Right lower leg: No edema.     Left lower leg: No edema.     Comments: Right hip s/p ORIF 09/08/20, healed.   Skin:    General: Skin is warm and dry.  Neurological:     General: No focal deficit present.     Mental Status: She is alert and oriented to person, place, and time. Mental status is at baseline.     Motor: No weakness.     Coordination: Coordination normal.     Gait: Gait abnormal.     Comments: Ambulates with walker.   Psychiatric:        Mood and Affect: Mood normal.         Behavior: Behavior normal.     Comments: Confused.      Labs reviewed: Recent Labs    12/27/20 1410 02/25/21 1533 06/01/21 0000 06/30/21 1605 07/22/21 0000  NA 140 139 140 137 138  K 4.1 4.5 4.2 4.4 3.9  CL 105 101 101 98 101  CO2 31 31 30* 31 32*  GLUCOSE 102* 89  --  91  --   BUN 29* '19 17 23 17  '$ CREATININE 0.52 0.74 0.6 0.68 0.7  CALCIUM 8.8* 9.9 9.2 10.1 8.8   Recent Labs    12/27/20 1410 02/25/21 1533 06/01/21 0000 06/30/21 1605 07/22/21 0000  AST '17 21 23 25 21  '$ ALT '15 13 17 20 16  '$ ALKPHOS 53 61 67 62  --   BILITOT 0.4 0.4  --  0.4  --   PROT 6.0* 7.3  --  7.6  --   ALBUMIN 3.3* 4.1 4.0 4.3 3.7   Recent Labs    12/27/20 1410 06/01/21 0000 06/30/21 1605 07/22/21 0000  WBC 4.3 5.5 6.5 4.6  NEUTROABS 2.3 3,031.00 4.1  --   HGB 12.0 13.8 13.3 12.5  HCT 36.8 43 40.8 38  MCV 95.8  --  92.8  --   PLT 158 180 199.0 206   Lab Results  Component Value Date   TSH 2.25 06/30/2021   Lab Results  Component Value Date   HGBA1C 5.5 01/06/2021   Lab Results  Component Value Date   CHOL 182 01/06/2021   HDL 58 01/06/2021   LDLCALC 117 (H) 01/06/2021   LDLDIRECT 135.4 12/26/2008   TRIG 37 01/06/2021   CHOLHDL 3.1 01/06/2021    Significant Diagnostic Results in last 30 days:  No results found.  Assessment/Plan Osteoarthritis, multiple sites stable, T11, T12, L1 compression fx, s/p T12 Kyphoplasty, takes Tylenol, Gabapentin  Failure to thrive in adult gradual weight loss, didn't tolerate Mirtazapine.  Thrombocytopenia ([place]) plt 206 07/22/21  Depression with suicidal ideation hx of suicidal attempt/ideations, anxious at times,  on Prozac, Zyprexa, Depakote, Mirtazapine made her AMS  Hyperlipidemia  diet, LDL 117 01/06/21  Allergic rhinitis  takes Zyrtec.  Vitamin D deficiency takes Vit d daily, Vit D 56.33 06/30/21  Vitamin B12 deficiency  Vit B12 level >1504 06/30/21, takes Vit B12  Alzheimer disease ([place]) TSH 2.25, Vit B12 >1504 06/30/21,   SNF [facility] for supportive care. MMSE 27/30 04/08/21  Slow transit constipation Stable, takes Senokot S, MiraLax.   HTN (hypertension) Blood pressure is controlled, off Amlodipine. Bun/creat 17/0.7 07/22/21  Blood loss anemia Hgb Iron 58, Hgb 13.3 06/30/21  Osteoporosis never received Evenity, taking Vit D, Ca. pending repeated DEXA, may consider Prolia.     Family/ staff Communication: plan of care reviewed with the patient and charge nurse.   Labs/tests ordered:  none  Time spend 35 minutes.

## 2021-11-09 NOTE — Assessment & Plan Note (Signed)
Hgb Iron 58, Hgb 13.3 06/30/21

## 2021-11-09 NOTE — Assessment & Plan Note (Signed)
stable, T11, T12, L1 compression fx, s/p T12 Kyphoplasty, takes Tylenol, Gabapentin 

## 2021-11-09 NOTE — Assessment & Plan Note (Signed)
TSH 2.25, Vit B12 >1504 06/30/21,  SNF [facility] for supportive care. MMSE 27/30 04/08/21 

## 2021-11-11 ENCOUNTER — Encounter: Payer: Self-pay | Admitting: Nurse Practitioner

## 2021-11-11 NOTE — Progress Notes (Signed)
This encounter was created in error - please disregard.

## 2021-11-12 ENCOUNTER — Encounter: Payer: Self-pay | Admitting: Nurse Practitioner

## 2021-11-12 DIAGNOSIS — R2681 Unsteadiness on feet: Secondary | ICD-10-CM | POA: Diagnosis not present

## 2021-11-12 DIAGNOSIS — R41841 Cognitive communication deficit: Secondary | ICD-10-CM | POA: Diagnosis not present

## 2021-11-12 DIAGNOSIS — M6281 Muscle weakness (generalized): Secondary | ICD-10-CM | POA: Diagnosis not present

## 2021-11-12 NOTE — Progress Notes (Signed)
This encounter was created in error - please disregard.

## 2021-11-16 ENCOUNTER — Ambulatory Visit: Payer: Medicare Other | Admitting: Internal Medicine

## 2021-11-16 DIAGNOSIS — R41841 Cognitive communication deficit: Secondary | ICD-10-CM | POA: Diagnosis not present

## 2021-11-16 DIAGNOSIS — M6281 Muscle weakness (generalized): Secondary | ICD-10-CM | POA: Diagnosis not present

## 2021-11-16 DIAGNOSIS — R2681 Unsteadiness on feet: Secondary | ICD-10-CM | POA: Diagnosis not present

## 2021-11-17 DIAGNOSIS — R41841 Cognitive communication deficit: Secondary | ICD-10-CM | POA: Diagnosis not present

## 2021-11-17 DIAGNOSIS — R2681 Unsteadiness on feet: Secondary | ICD-10-CM | POA: Diagnosis not present

## 2021-11-17 DIAGNOSIS — M6281 Muscle weakness (generalized): Secondary | ICD-10-CM | POA: Diagnosis not present

## 2021-11-18 DIAGNOSIS — R2681 Unsteadiness on feet: Secondary | ICD-10-CM | POA: Diagnosis not present

## 2021-11-18 DIAGNOSIS — M6281 Muscle weakness (generalized): Secondary | ICD-10-CM | POA: Diagnosis not present

## 2021-11-18 DIAGNOSIS — R41841 Cognitive communication deficit: Secondary | ICD-10-CM | POA: Diagnosis not present

## 2021-11-22 DIAGNOSIS — M6281 Muscle weakness (generalized): Secondary | ICD-10-CM | POA: Diagnosis not present

## 2021-11-22 DIAGNOSIS — R41841 Cognitive communication deficit: Secondary | ICD-10-CM | POA: Diagnosis not present

## 2021-11-22 DIAGNOSIS — R2681 Unsteadiness on feet: Secondary | ICD-10-CM | POA: Diagnosis not present

## 2021-11-23 DIAGNOSIS — R41841 Cognitive communication deficit: Secondary | ICD-10-CM | POA: Diagnosis not present

## 2021-11-23 DIAGNOSIS — M6281 Muscle weakness (generalized): Secondary | ICD-10-CM | POA: Diagnosis not present

## 2021-11-23 DIAGNOSIS — R2681 Unsteadiness on feet: Secondary | ICD-10-CM | POA: Diagnosis not present

## 2021-11-24 DIAGNOSIS — M6281 Muscle weakness (generalized): Secondary | ICD-10-CM | POA: Diagnosis not present

## 2021-11-24 DIAGNOSIS — R41841 Cognitive communication deficit: Secondary | ICD-10-CM | POA: Diagnosis not present

## 2021-11-24 DIAGNOSIS — R2681 Unsteadiness on feet: Secondary | ICD-10-CM | POA: Diagnosis not present

## 2021-11-25 DIAGNOSIS — R2681 Unsteadiness on feet: Secondary | ICD-10-CM | POA: Diagnosis not present

## 2021-11-25 DIAGNOSIS — R41841 Cognitive communication deficit: Secondary | ICD-10-CM | POA: Diagnosis not present

## 2021-11-25 DIAGNOSIS — M6281 Muscle weakness (generalized): Secondary | ICD-10-CM | POA: Diagnosis not present

## 2021-11-26 DIAGNOSIS — H25013 Cortical age-related cataract, bilateral: Secondary | ICD-10-CM | POA: Diagnosis not present

## 2021-11-26 DIAGNOSIS — H353132 Nonexudative age-related macular degeneration, bilateral, intermediate dry stage: Secondary | ICD-10-CM | POA: Diagnosis not present

## 2021-11-26 DIAGNOSIS — H2513 Age-related nuclear cataract, bilateral: Secondary | ICD-10-CM | POA: Diagnosis not present

## 2021-11-29 DIAGNOSIS — R2681 Unsteadiness on feet: Secondary | ICD-10-CM | POA: Diagnosis not present

## 2021-11-29 DIAGNOSIS — F411 Generalized anxiety disorder: Secondary | ICD-10-CM | POA: Diagnosis not present

## 2021-11-29 DIAGNOSIS — F332 Major depressive disorder, recurrent severe without psychotic features: Secondary | ICD-10-CM | POA: Diagnosis not present

## 2021-11-29 DIAGNOSIS — F03A3 Unspecified dementia, mild, with mood disturbance: Secondary | ICD-10-CM | POA: Diagnosis not present

## 2021-11-29 DIAGNOSIS — R41841 Cognitive communication deficit: Secondary | ICD-10-CM | POA: Diagnosis not present

## 2021-11-29 DIAGNOSIS — G4700 Insomnia, unspecified: Secondary | ICD-10-CM | POA: Diagnosis not present

## 2021-11-29 DIAGNOSIS — M6281 Muscle weakness (generalized): Secondary | ICD-10-CM | POA: Diagnosis not present

## 2021-11-30 DIAGNOSIS — R2681 Unsteadiness on feet: Secondary | ICD-10-CM | POA: Diagnosis not present

## 2021-11-30 DIAGNOSIS — R41841 Cognitive communication deficit: Secondary | ICD-10-CM | POA: Diagnosis not present

## 2021-11-30 DIAGNOSIS — M6281 Muscle weakness (generalized): Secondary | ICD-10-CM | POA: Diagnosis not present

## 2021-12-02 DIAGNOSIS — M6281 Muscle weakness (generalized): Secondary | ICD-10-CM | POA: Diagnosis not present

## 2021-12-02 DIAGNOSIS — R2681 Unsteadiness on feet: Secondary | ICD-10-CM | POA: Diagnosis not present

## 2021-12-02 DIAGNOSIS — R41841 Cognitive communication deficit: Secondary | ICD-10-CM | POA: Diagnosis not present

## 2021-12-07 DIAGNOSIS — R41841 Cognitive communication deficit: Secondary | ICD-10-CM | POA: Diagnosis not present

## 2021-12-07 DIAGNOSIS — R2681 Unsteadiness on feet: Secondary | ICD-10-CM | POA: Diagnosis not present

## 2021-12-07 DIAGNOSIS — R627 Adult failure to thrive: Secondary | ICD-10-CM | POA: Diagnosis not present

## 2021-12-07 DIAGNOSIS — M6281 Muscle weakness (generalized): Secondary | ICD-10-CM | POA: Diagnosis not present

## 2021-12-08 DIAGNOSIS — R41841 Cognitive communication deficit: Secondary | ICD-10-CM | POA: Diagnosis not present

## 2021-12-08 DIAGNOSIS — M6281 Muscle weakness (generalized): Secondary | ICD-10-CM | POA: Diagnosis not present

## 2021-12-08 DIAGNOSIS — R2681 Unsteadiness on feet: Secondary | ICD-10-CM | POA: Diagnosis not present

## 2021-12-09 DIAGNOSIS — R41841 Cognitive communication deficit: Secondary | ICD-10-CM | POA: Diagnosis not present

## 2021-12-09 DIAGNOSIS — Z23 Encounter for immunization: Secondary | ICD-10-CM | POA: Diagnosis not present

## 2021-12-09 DIAGNOSIS — M6281 Muscle weakness (generalized): Secondary | ICD-10-CM | POA: Diagnosis not present

## 2021-12-09 DIAGNOSIS — R2681 Unsteadiness on feet: Secondary | ICD-10-CM | POA: Diagnosis not present

## 2021-12-10 LAB — VITAMIN B12: Vitamin B-12: 1892

## 2021-12-14 DIAGNOSIS — R2681 Unsteadiness on feet: Secondary | ICD-10-CM | POA: Diagnosis not present

## 2021-12-14 DIAGNOSIS — M6281 Muscle weakness (generalized): Secondary | ICD-10-CM | POA: Diagnosis not present

## 2021-12-14 DIAGNOSIS — R41841 Cognitive communication deficit: Secondary | ICD-10-CM | POA: Diagnosis not present

## 2021-12-16 DIAGNOSIS — M6281 Muscle weakness (generalized): Secondary | ICD-10-CM | POA: Diagnosis not present

## 2021-12-16 DIAGNOSIS — R2681 Unsteadiness on feet: Secondary | ICD-10-CM | POA: Diagnosis not present

## 2021-12-16 DIAGNOSIS — R41841 Cognitive communication deficit: Secondary | ICD-10-CM | POA: Diagnosis not present

## 2021-12-21 DIAGNOSIS — M6281 Muscle weakness (generalized): Secondary | ICD-10-CM | POA: Diagnosis not present

## 2021-12-21 DIAGNOSIS — R41841 Cognitive communication deficit: Secondary | ICD-10-CM | POA: Diagnosis not present

## 2021-12-21 DIAGNOSIS — R2681 Unsteadiness on feet: Secondary | ICD-10-CM | POA: Diagnosis not present

## 2021-12-23 DIAGNOSIS — M6281 Muscle weakness (generalized): Secondary | ICD-10-CM | POA: Diagnosis not present

## 2021-12-23 DIAGNOSIS — R41841 Cognitive communication deficit: Secondary | ICD-10-CM | POA: Diagnosis not present

## 2021-12-23 DIAGNOSIS — R2681 Unsteadiness on feet: Secondary | ICD-10-CM | POA: Diagnosis not present

## 2021-12-24 DIAGNOSIS — M6281 Muscle weakness (generalized): Secondary | ICD-10-CM | POA: Diagnosis not present

## 2021-12-24 DIAGNOSIS — R41841 Cognitive communication deficit: Secondary | ICD-10-CM | POA: Diagnosis not present

## 2021-12-24 DIAGNOSIS — R2681 Unsteadiness on feet: Secondary | ICD-10-CM | POA: Diagnosis not present

## 2021-12-28 ENCOUNTER — Non-Acute Institutional Stay (SKILLED_NURSING_FACILITY): Payer: Medicare Other | Admitting: Family Medicine

## 2021-12-28 DIAGNOSIS — S22000A Wedge compression fracture of unspecified thoracic vertebra, initial encounter for closed fracture: Secondary | ICD-10-CM | POA: Diagnosis not present

## 2021-12-28 DIAGNOSIS — R627 Adult failure to thrive: Secondary | ICD-10-CM | POA: Diagnosis not present

## 2021-12-28 DIAGNOSIS — G309 Alzheimer's disease, unspecified: Secondary | ICD-10-CM

## 2021-12-28 DIAGNOSIS — F332 Major depressive disorder, recurrent severe without psychotic features: Secondary | ICD-10-CM

## 2021-12-28 DIAGNOSIS — R2681 Unsteadiness on feet: Secondary | ICD-10-CM | POA: Diagnosis not present

## 2021-12-28 DIAGNOSIS — M6281 Muscle weakness (generalized): Secondary | ICD-10-CM | POA: Diagnosis not present

## 2021-12-28 DIAGNOSIS — R41841 Cognitive communication deficit: Secondary | ICD-10-CM | POA: Diagnosis not present

## 2021-12-28 DIAGNOSIS — M545 Low back pain, unspecified: Secondary | ICD-10-CM

## 2021-12-28 DIAGNOSIS — F0280 Dementia in other diseases classified elsewhere without behavioral disturbance: Secondary | ICD-10-CM

## 2021-12-28 DIAGNOSIS — M159 Polyosteoarthritis, unspecified: Secondary | ICD-10-CM

## 2021-12-28 NOTE — Progress Notes (Signed)
Provider:  Alain Honey, MD Location:      Place of Service:     PCP: Mast, Man X, NP Patient Care Team: Mast, Man X, NP as PCP - General (Internal Medicine)  Extended Emergency Contact Information Primary Emergency Contact: Valeen, Borys Address: [address]          [place], [place] [address] [place] Phone: [phone] Relation: Spouse Secondary Emergency Contact: [facility] Phone: [phone] Relation: Daughter  Code Status:  Goals of Care: Advanced Directive information    11/09/2021   11:37 AM  Advanced Directives  Does Patient Have a Medical Advance Directive? Yes  Type of Paramedic of [place];Living will  Does patient want to make changes to medical advance directive? No - Patient declined  Copy of [place] in Chart? Yes - validated most recent copy scanned in chart (See row information)      No chief complaint on file.   HPI: Patient is a 81 y.o. female seen today for medical management of chronic problems including Alzheimer's disease, osteoarthritis, chronic low back pain, adult failure to thrive, osteoporosis, and hypertension. I found her in her room resting in bed after lunch.  She says that she still has some back pain.  Her main concern today is that her toenails need trimming.  We probably need to consult podiatry for that problem.  She tells me her weight loss has stabilized but she still does not enjoy the food here. Regarding her dementia, last MMSE done 6 months ago was 27/30.  She is on no medications. Regarding osteoporosis she is under consideration for Prolia.  At her relatively young age of 53 I think this would be reasonable.  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   Depression    years ago   Headache    sinus headaches   Lipoma of arm    Right   Medical history non-contributory    Osteoporosis     SVD (spontaneous vaginal delivery)    x 2   Varicose vein    Vitamin D deficiency    Past Surgical History:  Procedure Laterality Date   ANTERIOR APPROACH HEMI HIP ARTHROPLASTY Right 09/04/2020   Procedure: ANTERIOR APPROACH HEMI HIP ARTHROPLASTY;  Surgeon: Rod Can, MD;  Location[facility];  Service: Orthopedics;  Laterality: Right;   [place]   COLONOSCOPY     GANGLION CYST EXCISION     x2 left and right arm   HYSTEROSCOPY WITH D & C N/A 07/15/2013   Procedure: DILATATION AND CURETTAGE /HYSTEROSCOPY;  Surgeon: Lovenia Kim, MD;  Location: [facility];  Service: Gynecology;  Laterality: N/A;   KYPHOPLASTY Bilateral 05/04/2020   Procedure: KYPHOPLASTY THORACIC TWELVE;  Surgeon: Vallarie Mare, MD;  Location: [facility];  Service: Neurosurgery;  Laterality: Bilateral;   MANDIBLE SURGERY  1954   WISDOM TOOTH EXTRACTION      reports that she has never smoked. She has never used smokeless tobacco. She reports that she does not drink alcohol and does not use drugs. Social History   Socioeconomic History   Marital status: Married    Spouse name: Not on file   Number of children: 1   Years of education: Not on file   Highest education level: Not on file  Occupational History  Occupation: retired  Tobacco Use   Smoking status: Never   Smokeless tobacco: Never  Vaping Use   Vaping Use: Never used  Substance and Sexual Activity   Alcohol use: No   Drug use: No   Sexual activity: Yes    Birth control/protection: Post-menopausal  Other Topics Concern   Not on file  Social History Narrative   GI in [place] for colonoscopy   Dr Ronita Hipps - GYN      Denies Surgical history      Family history of varicose veins   Mother is 74      Regular exercise - NO   Social Determinants of Health   Financial Resource Strain: Low Risk  (10/10/2019)   Overall Financial Resource Strain (CARDIA)    Difficulty of Paying Living Expenses: Not hard at all  Food  Insecurity: No Food Insecurity (10/10/2019)   Hunger Vital Sign    Worried About Running Out of Food in the Last Year: Never true    [place] in the Last Year: Never true  Transportation Needs: No Transportation Needs (10/10/2019)   PRAPARE - Hydrologist (Medical): No    Lack of Transportation (Non-Medical): No  Physical Activity: Sufficiently Active (10/10/2019)   Exercise Vital Sign    Days of Exercise per Week: 5 days    Minutes of Exercise per Session: 30 min  Stress: Stress Concern Present (10/10/2019)   [place]    Feeling of Stress : Rather much  Social Connections: Moderately Integrated (10/10/2019)   Social Connection and Isolation Panel [NHANES]    Frequency of Communication with Friends and Family: More than three times a week    Frequency of Social Gatherings with Friends and Family: Never    Attends Religious Services: Never    Marine scientist or Organizations: Yes    Attends Archivist Meetings: Never    Marital Status: Married  Human resources officer Violence: Not At Risk (10/10/2019)   Humiliation, Afraid, Rape, and Kick questionnaire    Fear of Current or Ex-Partner: No    Emotionally Abused: No    Physically Abused: No    Sexually Abused: No    Functional Status Survey:    Family History  Problem Relation Age of Onset   Hypertension Mother    Heart disease Father    Cancer Father     Health Maintenance  Topic Date Due   INFLUENZA VACCINE  09/07/2021   COVID-19 Vaccine (7 - 2023-24 season) 10/08/2021   Medicare Annual Wellness (AWV)  04/01/2022   Pneumonia Vaccine 52+ Years old  Completed   DEXA SCAN  Completed   Zoster Vaccines- Shingrix  Completed   HPV VACCINES  Aged Out    Allergies  Allergen Reactions   Actonel [Risedronate] Other (See Comments)    "Allergic," per MAR   Alendronate Sodium Other (See Comments)    Leg cramps and  "Allergic," per MAR   Fosamax [Alendronate]    Remeron [Mirtazapine]    Risedronate Sodium Other (See Comments)    Caused the patient to be achy and is "Allergic," per MAR   Tramadol Other (See Comments)    Caused the patient to feel badly    Keflex [Cephalexin] Other (See Comments)    Possible rash    Outpatient Encounter Medications as of 12/28/2021  Medication Sig   acetaminophen (TYLENOL) 325 MG tablet Take 650 mg by mouth in  the morning and at bedtime.   acetaminophen (TYLENOL) 500 MG tablet Take 1,000 mg by mouth every 8 (eight) hours as needed for moderate pain, mild pain, headache or fever.   ASPIRIN 81 PO Take 1 tablet by mouth daily.   Calcium Carbonate (CALCIUM-CARB 600 PO) Take 1 tablet by mouth 2 (two) times daily.   Camphor-Menthol-Methyl Sal 3.02-13-08 % PTCH Apply 1 patch topically daily as needed.   cetirizine (ZYRTEC ALLERGY) 10 MG tablet Take 1 tablet (10 mg total) by mouth daily.   Cholecalciferol (VITAMIN D3) 2000 units capsule Take 1 capsule (2,000 Units total) by mouth daily.   Cobalamin Combinations (VITAMIN B12-FOLIC ACID PO) Place 1 lozenge under the tongue in the morning.   divalproex (DEPAKOTE SPRINKLE) 125 MG capsule Take 125 mg by mouth 2 (two) times daily.   docusate sodium (COLACE) 100 MG capsule Take 100 mg by mouth at bedtime.   FLUoxetine (PROZAC) 20 MG capsule Take 1 capsule (20 mg total) by mouth at bedtime.   gabapentin (NEURONTIN) 100 MG capsule Take 1 capsule (100 mg total) by mouth 3 (three) times daily.   KETOCONAZOLE, TOPICAL, (NIZORAL A-D) 1 % SHAM Apply 1 application. topically once a week. Monday   Liniments (BLUE-EMU SUPER STRENGTH) CREA Apply 4 oz topically. Apply thin layer to the back.   melatonin 5 MG TABS Take 5 mg by mouth at bedtime.   Multiple Vitamins-Minerals (PRESERVISION AREDS 2 PO) Take 1 capsule by mouth 2 (two) times daily.   OLANZapine (ZYPREXA) 7.5 MG tablet Take 7.5 mg by mouth at bedtime.   polyethylene glycol (MIRALAX /  GLYCOLAX) 17 g packet Take 17 g by mouth daily.   senna-docusate (SENOKOT-S) 8.6-50 MG tablet Take 2 tablets by mouth daily.   sodium phosphate (FLEET) 7-19 GM/118ML ENEM Place 1 enema rectally daily as needed for severe constipation or mild constipation.   No facility-administered encounter medications on file as of 12/28/2021.    Review of Systems  Constitutional: Negative.   HENT: Negative.    Respiratory: Negative.    Cardiovascular: Negative.   Musculoskeletal:  Positive for arthralgias and back pain.  Neurological: Negative.   Psychiatric/Behavioral:  Positive for confusion.   All other systems reviewed and are negative.   There were no vitals filed for this visit. There is no height or weight on file to calculate BMI. Physical Exam Vitals and nursing note reviewed.  Constitutional:      Appearance: Normal appearance.  HENT:     Mouth/Throat:     Mouth: Mucous membranes are moist.     Pharynx: Oropharynx is clear.  Eyes:     Pupils: Pupils are equal, round, and reactive to light.  Cardiovascular:     Rate and Rhythm: Normal rate and regular rhythm.  Pulmonary:     Effort: Pulmonary effort is normal.     Breath sounds: Normal breath sounds.  Neurological:     General: No focal deficit present.     Mental Status: She is alert and oriented to person, place, and time.  Psychiatric:        Mood and Affect: Mood normal.     Labs reviewed: Basic Metabolic Panel: Recent Labs    02/25/21 1533 06/01/21 0000 06/30/21 1605 07/22/21 0000  NA 139 140 137 138  K 4.5 4.2 4.4 3.9  CL 101 101 98 101  CO2 31 30* 31 32*  GLUCOSE 89  --  91  --   BUN '19 17 23 17  '$ CREATININE 0.74  0.6 0.68 0.7  CALCIUM 9.9 9.2 10.1 8.8   Liver Function Tests: Recent Labs    02/25/21 1533 06/01/21 0000 06/30/21 1605 07/22/21 0000  AST '21 23 25 21  '$ ALT '13 17 20 16  '$ ALKPHOS 61 67 62  --   BILITOT 0.4  --  0.4  --   PROT 7.3  --  7.6  --   ALBUMIN 4.1 4.0 4.3 3.7   No results for  input(s): "LIPASE", "AMYLASE" in the last 8760 hours. No results for input(s): "AMMONIA" in the last 8760 hours. CBC: Recent Labs    06/01/21 0000 06/30/21 1605 07/22/21 0000  WBC 5.5 6.5 4.6  NEUTROABS 3,031.00 4.1  --   HGB 13.8 13.3 12.5  HCT 43 40.8 38  MCV  --  92.8  --   PLT 180 199.0 206   Cardiac Enzymes: No results for input(s): "CKTOTAL", "CKMB", "CKMBINDEX", "TROPONINI" in the last 8760 hours. BNP: Invalid input(s): "POCBNP" Lab Results  Component Value Date   HGBA1C 5.5 01/06/2021   Lab Results  Component Value Date   TSH 2.25 06/30/2021   Lab Results  Component Value Date   VITAMINB12 >1504 (H) 06/30/2021   No results found for: "FOLATE" Lab Results  Component Value Date   IRON 58 06/30/2021   TIBC 313 06/30/2021   FERRITIN 72 06/30/2021    Imaging and Procedures obtained prior to SNF admission: No results found.  Assessment/Plan 1. Alzheimer disease ([place]) With MMSE of 27/30.  Her dementia would be considered mild.  Continue supportive care  2. Compression fracture of body of thoracic vertebra (HCC) Status post kyphoplasty.  Takes Tylenol and gabapentin  3. Failure to thrive in adult Take mirtazapine.  Weight is down 5 pounds over the last 6 months   5. Severe episode of recurrent major depressive disorder, without psychotic features ([place]) [place] taking Prozac and Zyprexa as well as Depakote  6. Primary osteoarthritis involving multiple joints Managed with Tylenol and gabapentin    Family/ staff Communication:   Labs/tests ordered:  .[email or web address]

## 2021-12-31 DIAGNOSIS — R41841 Cognitive communication deficit: Secondary | ICD-10-CM | POA: Diagnosis not present

## 2021-12-31 DIAGNOSIS — R2681 Unsteadiness on feet: Secondary | ICD-10-CM | POA: Diagnosis not present

## 2021-12-31 DIAGNOSIS — M6281 Muscle weakness (generalized): Secondary | ICD-10-CM | POA: Diagnosis not present

## 2022-01-03 DIAGNOSIS — R41841 Cognitive communication deficit: Secondary | ICD-10-CM | POA: Diagnosis not present

## 2022-01-03 DIAGNOSIS — M6281 Muscle weakness (generalized): Secondary | ICD-10-CM | POA: Diagnosis not present

## 2022-01-03 DIAGNOSIS — R2681 Unsteadiness on feet: Secondary | ICD-10-CM | POA: Diagnosis not present

## 2022-01-04 DIAGNOSIS — R2681 Unsteadiness on feet: Secondary | ICD-10-CM | POA: Diagnosis not present

## 2022-01-04 DIAGNOSIS — G4700 Insomnia, unspecified: Secondary | ICD-10-CM | POA: Diagnosis not present

## 2022-01-04 DIAGNOSIS — R41841 Cognitive communication deficit: Secondary | ICD-10-CM | POA: Diagnosis not present

## 2022-01-04 DIAGNOSIS — F03A3 Unspecified dementia, mild, with mood disturbance: Secondary | ICD-10-CM | POA: Diagnosis not present

## 2022-01-04 DIAGNOSIS — F411 Generalized anxiety disorder: Secondary | ICD-10-CM | POA: Diagnosis not present

## 2022-01-04 DIAGNOSIS — F332 Major depressive disorder, recurrent severe without psychotic features: Secondary | ICD-10-CM | POA: Diagnosis not present

## 2022-01-04 DIAGNOSIS — M6281 Muscle weakness (generalized): Secondary | ICD-10-CM | POA: Diagnosis not present

## 2022-01-05 DIAGNOSIS — R2681 Unsteadiness on feet: Secondary | ICD-10-CM | POA: Diagnosis not present

## 2022-01-05 DIAGNOSIS — M6281 Muscle weakness (generalized): Secondary | ICD-10-CM | POA: Diagnosis not present

## 2022-01-05 DIAGNOSIS — R41841 Cognitive communication deficit: Secondary | ICD-10-CM | POA: Diagnosis not present

## 2022-01-06 DIAGNOSIS — R2681 Unsteadiness on feet: Secondary | ICD-10-CM | POA: Diagnosis not present

## 2022-01-06 DIAGNOSIS — R41841 Cognitive communication deficit: Secondary | ICD-10-CM | POA: Diagnosis not present

## 2022-01-06 DIAGNOSIS — M6281 Muscle weakness (generalized): Secondary | ICD-10-CM | POA: Diagnosis not present

## 2022-01-10 DIAGNOSIS — N3946 Mixed incontinence: Secondary | ICD-10-CM | POA: Diagnosis not present

## 2022-01-10 DIAGNOSIS — G309 Alzheimer's disease, unspecified: Secondary | ICD-10-CM | POA: Diagnosis not present

## 2022-01-10 DIAGNOSIS — R41841 Cognitive communication deficit: Secondary | ICD-10-CM | POA: Diagnosis not present

## 2022-01-10 DIAGNOSIS — R2681 Unsteadiness on feet: Secondary | ICD-10-CM | POA: Diagnosis not present

## 2022-01-11 DIAGNOSIS — G309 Alzheimer's disease, unspecified: Secondary | ICD-10-CM | POA: Diagnosis not present

## 2022-01-11 DIAGNOSIS — R41841 Cognitive communication deficit: Secondary | ICD-10-CM | POA: Diagnosis not present

## 2022-01-11 DIAGNOSIS — R2681 Unsteadiness on feet: Secondary | ICD-10-CM | POA: Diagnosis not present

## 2022-01-11 DIAGNOSIS — N3946 Mixed incontinence: Secondary | ICD-10-CM | POA: Diagnosis not present

## 2022-01-12 DIAGNOSIS — G309 Alzheimer's disease, unspecified: Secondary | ICD-10-CM | POA: Diagnosis not present

## 2022-01-12 DIAGNOSIS — N3946 Mixed incontinence: Secondary | ICD-10-CM | POA: Diagnosis not present

## 2022-01-12 DIAGNOSIS — R41841 Cognitive communication deficit: Secondary | ICD-10-CM | POA: Diagnosis not present

## 2022-01-12 DIAGNOSIS — R2681 Unsteadiness on feet: Secondary | ICD-10-CM | POA: Diagnosis not present

## 2022-01-14 DIAGNOSIS — N3946 Mixed incontinence: Secondary | ICD-10-CM | POA: Diagnosis not present

## 2022-01-14 DIAGNOSIS — R41841 Cognitive communication deficit: Secondary | ICD-10-CM | POA: Diagnosis not present

## 2022-01-14 DIAGNOSIS — G309 Alzheimer's disease, unspecified: Secondary | ICD-10-CM | POA: Diagnosis not present

## 2022-01-14 DIAGNOSIS — R2681 Unsteadiness on feet: Secondary | ICD-10-CM | POA: Diagnosis not present

## 2022-01-16 DIAGNOSIS — N3946 Mixed incontinence: Secondary | ICD-10-CM | POA: Diagnosis not present

## 2022-01-16 DIAGNOSIS — R2681 Unsteadiness on feet: Secondary | ICD-10-CM | POA: Diagnosis not present

## 2022-01-16 DIAGNOSIS — G309 Alzheimer's disease, unspecified: Secondary | ICD-10-CM | POA: Diagnosis not present

## 2022-01-16 DIAGNOSIS — R41841 Cognitive communication deficit: Secondary | ICD-10-CM | POA: Diagnosis not present

## 2022-01-17 DIAGNOSIS — G309 Alzheimer's disease, unspecified: Secondary | ICD-10-CM | POA: Diagnosis not present

## 2022-01-17 DIAGNOSIS — R2681 Unsteadiness on feet: Secondary | ICD-10-CM | POA: Diagnosis not present

## 2022-01-17 DIAGNOSIS — R41841 Cognitive communication deficit: Secondary | ICD-10-CM | POA: Diagnosis not present

## 2022-01-17 DIAGNOSIS — N3946 Mixed incontinence: Secondary | ICD-10-CM | POA: Diagnosis not present

## 2022-01-18 ENCOUNTER — Non-Acute Institutional Stay (SKILLED_NURSING_FACILITY): Payer: Medicare Other | Admitting: Nurse Practitioner

## 2022-01-18 ENCOUNTER — Encounter: Payer: Self-pay | Admitting: Nurse Practitioner

## 2022-01-18 DIAGNOSIS — D500 Iron deficiency anemia secondary to blood loss (chronic): Secondary | ICD-10-CM | POA: Diagnosis not present

## 2022-01-18 DIAGNOSIS — F32A Depression, unspecified: Secondary | ICD-10-CM

## 2022-01-18 DIAGNOSIS — K5901 Slow transit constipation: Secondary | ICD-10-CM

## 2022-01-18 DIAGNOSIS — E538 Deficiency of other specified B group vitamins: Secondary | ICD-10-CM | POA: Diagnosis not present

## 2022-01-18 DIAGNOSIS — M545 Low back pain, unspecified: Secondary | ICD-10-CM

## 2022-01-18 DIAGNOSIS — N3946 Mixed incontinence: Secondary | ICD-10-CM | POA: Diagnosis not present

## 2022-01-18 DIAGNOSIS — E559 Vitamin D deficiency, unspecified: Secondary | ICD-10-CM | POA: Diagnosis not present

## 2022-01-18 DIAGNOSIS — G309 Alzheimer's disease, unspecified: Secondary | ICD-10-CM

## 2022-01-18 DIAGNOSIS — R45851 Suicidal ideations: Secondary | ICD-10-CM

## 2022-01-18 DIAGNOSIS — R627 Adult failure to thrive: Secondary | ICD-10-CM

## 2022-01-18 DIAGNOSIS — D696 Thrombocytopenia, unspecified: Secondary | ICD-10-CM | POA: Diagnosis not present

## 2022-01-18 DIAGNOSIS — E785 Hyperlipidemia, unspecified: Secondary | ICD-10-CM

## 2022-01-18 DIAGNOSIS — R41841 Cognitive communication deficit: Secondary | ICD-10-CM | POA: Diagnosis not present

## 2022-01-18 DIAGNOSIS — I10 Essential (primary) hypertension: Secondary | ICD-10-CM

## 2022-01-18 DIAGNOSIS — J3089 Other allergic rhinitis: Secondary | ICD-10-CM

## 2022-01-18 DIAGNOSIS — R2681 Unsteadiness on feet: Secondary | ICD-10-CM | POA: Diagnosis not present

## 2022-01-18 DIAGNOSIS — M810 Age-related osteoporosis without current pathological fracture: Secondary | ICD-10-CM

## 2022-01-18 DIAGNOSIS — F0280 Dementia in other diseases classified elsewhere without behavioral disturbance: Secondary | ICD-10-CM

## 2022-01-18 NOTE — Assessment & Plan Note (Signed)
takes Zyrtec.

## 2022-01-18 NOTE — Assessment & Plan Note (Signed)
hx of suicidal attempt/ideations, anxious at times,  on Prozac, Zyprexa, Depakote, Mirtazapine made her AMS

## 2022-01-18 NOTE — Assessment & Plan Note (Signed)
stable, T11, T12, L1 compression fx, s/p T12 Kyphoplasty, takes Tylenol, Gabapentin

## 2022-01-18 NOTE — Assessment & Plan Note (Signed)
Stable, takes Senokot S, MiraLax. 

## 2022-01-18 NOTE — Assessment & Plan Note (Signed)
TSH 2.25, Vit B12 >1504 06/30/21,  SNF [facility] for supportive care. MMSE 27/30 04/08/21

## 2022-01-18 NOTE — Assessment & Plan Note (Signed)
Iron 58, Hgb 13.3 06/30/21

## 2022-01-18 NOTE — Assessment & Plan Note (Signed)
takes Vit d daily, Vit D 56.33 06/30/21

## 2022-01-18 NOTE — Assessment & Plan Note (Signed)
Weight loss/adult failure to thrive, gradual weight loss, didn't tolerate Mirtazapine.

## 2022-01-18 NOTE — Assessment & Plan Note (Signed)
plt 206 07/22/21

## 2022-01-18 NOTE — Assessment & Plan Note (Signed)
Vit B12 level >1504 06/30/21, takes Vit B12

## 2022-01-18 NOTE — Assessment & Plan Note (Signed)
diet, LDL 117 01/06/21

## 2022-01-18 NOTE — Assessment & Plan Note (Signed)
never received Evenity, taking Vit D, Ca. pending repeated DEXA, may consider Prolia.

## 2022-01-18 NOTE — Progress Notes (Signed)
Location:  [place] Room Number: NO/30/A Place of Service:  SNF (31) Provider:  Taevin Mcferran X, NP  Patient Care Team: Mady Oubre X, NP as PCP - General (Internal Medicine)  Extended Emergency Contact Information Primary Emergency Contact: Dorina, Ribaudo Address: [address]          [place], [place] [address] [place] Phone: [phone] Relation: Spouse Secondary Emergency Contact: [facility] Phone: [phone] Relation: Daughter  Code Status:  FULL Goals of care: Advanced Directive information    01/25/2022   11:05 AM  Advanced Directives  Does Patient Have a Medical Advance Directive? No;Yes  Type of Advance Directive [place]  Does patient want to make changes to medical advance directive? No - Patient declined  Copy of [place] in Chart? Yes - validated most recent copy scanned in chart (See row information)  Would patient like information on creating a medical advance directive? No - Patient declined     Chief Complaint  Patient presents with   Medical Management of Chronic Issues    Patient is here for a follow up for chronic conditions    Immunizations    Patient is due for updated flu vaccine    HPI:  Pt is a 81 y.o. female seen today for medical management of chronic diseases.   OP, never received Evenity, taking Vit D, Ca. pending repeated DEXA, may consider Prolia.              Anemia, Iron 58, Hgb 13.3 06/30/21             HTN, off Amlodipine. Bun/creat 17/0.7 07/22/21             Constipation, takes Senokot S, MiraLax.              Alzheimer's Dementia, TSH 2.25, Vit B12 >1504 06/30/21,  SNF [facility] for supportive care. MMSE 27/30 04/08/21             Vit B12 deficiency, Vit B12 level >1504 06/30/21, takes Vit B12             Vitamin D deficiency, takes Vit d daily, Vit D 56.33 06/30/21             Allergic rhinitis, takes Zyrtec.             Hyperlipidemia, diet, LDL  117 01/06/21             Depression, hx of suicidal attempt/ideations, anxious at times,  on Prozac, Zyprexa, Depakote, Mirtazapine made her AMS             Chronic back pain, stable, T11, T12, L1 compression fx, s/p T12 Kyphoplasty, takes Tylenol, Gabapentin             Weight loss/adult failure to thrive, gradual weight loss, didn't tolerate Mirtazapine.             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   Depression    years ago   Headache    sinus headaches   Lipoma of arm    Right   Medical history non-contributory    Osteoporosis    SVD (spontaneous vaginal delivery)    x 2   Varicose vein    Vitamin D deficiency  Past Surgical History:  Procedure Laterality Date   ANTERIOR APPROACH HEMI HIP ARTHROPLASTY Right 09/04/2020   Procedure: ANTERIOR APPROACH HEMI HIP ARTHROPLASTY;  Surgeon: Rod Can, MD;  Location: [facility];  Service: Orthopedics;  Laterality: Right;   [place]   COLONOSCOPY     GANGLION CYST EXCISION     x2 left and right arm   HYSTEROSCOPY WITH D & C N/A 07/15/2013   Procedure: DILATATION AND CURETTAGE /HYSTEROSCOPY;  Surgeon: Lovenia Kim, MD;  Location: [facility];  Service: Gynecology;  Laterality: N/A;   KYPHOPLASTY Bilateral 05/04/2020   Procedure: KYPHOPLASTY THORACIC TWELVE;  Surgeon: Vallarie Mare, MD;  Location: [place];  Service: Neurosurgery;  Laterality: Bilateral;   MANDIBLE SURGERY  1954   WISDOM TOOTH EXTRACTION      Allergies  Allergen Reactions   Actonel [Risedronate] Other (See Comments)    "Allergic," per MAR   Alendronate Sodium Other (See Comments)    Leg cramps and "Allergic," per MAR   Fosamax [Alendronate]    Remeron [Mirtazapine]    Risedronate Sodium Other (See Comments)    Caused the patient to be achy and is "Allergic," per MAR   Tramadol Other (See Comments)    Caused the  patient to feel badly    Keflex [Cephalexin] Other (See Comments)    Possible rash    Outpatient Encounter Medications as of 01/18/2022  Medication Sig   acetaminophen (TYLENOL) 325 MG tablet Take 650 mg by mouth in the morning and at bedtime.   acetaminophen (TYLENOL) 500 MG tablet Take 1,000 mg by mouth every 8 (eight) hours as needed for moderate pain, mild pain, headache or fever.   ASPIRIN 81 PO Take 1 tablet by mouth daily.   Calcium Carbonate (CALCIUM-CARB 600 PO) Take 1 tablet by mouth 2 (two) times daily.   Camphor-Menthol-Methyl Sal 3.02-13-08 % PTCH Apply 1 patch topically daily as needed.   cetirizine (ZYRTEC ALLERGY) 10 MG tablet Take 1 tablet (10 mg total) by mouth daily.   Cholecalciferol (VITAMIN D3) 2000 units capsule Take 1 capsule (2,000 Units total) by mouth daily.   Cobalamin Combinations (VITAMIN B12-FOLIC ACID PO) Place 1 lozenge under the tongue in the morning.   divalproex (DEPAKOTE SPRINKLE) 125 MG capsule Take 125 mg by mouth 2 (two) times daily.   docusate sodium (COLACE) 100 MG capsule Take 100 mg by mouth at bedtime.   FLUoxetine (PROZAC) 20 MG capsule Take 1 capsule (20 mg total) by mouth at bedtime.   gabapentin (NEURONTIN) 100 MG capsule Take 1 capsule (100 mg total) by mouth 3 (three) times daily.   KETOCONAZOLE, TOPICAL, (NIZORAL A-D) 1 % SHAM Apply 1 application. topically once a week. Monday   Liniments (BLUE-EMU SUPER STRENGTH) CREA Apply 4 oz topically. Apply thin layer to the back.   melatonin 5 MG TABS Take 5 mg by mouth at bedtime.   Multiple Vitamins-Minerals (PRESERVISION AREDS 2 PO) Take 1 capsule by mouth 2 (two) times daily.   OLANZapine (ZYPREXA) 7.5 MG tablet Take 7.5 mg by mouth at bedtime.   polyethylene glycol (MIRALAX / GLYCOLAX) 17 g packet Take 17 g by mouth daily.   senna-docusate (SENOKOT-S) 8.6-50 MG tablet Take 2 tablets by mouth daily.   sodium phosphate (FLEET) 7-19 GM/118ML ENEM Place 1 enema rectally daily as needed for severe  constipation or mild constipation.   No facility-administered encounter medications on file as of 01/18/2022.    Review of Systems  Constitutional:  Negative for activity change, appetite change and fever.  HENT:  Positive for hearing loss. Negative for congestion and voice change.   Eyes:  Negative for visual disturbance.  Respiratory:  Negative for cough and shortness of breath.   Cardiovascular:  Negative for leg swelling.  Gastrointestinal:  Negative for abdominal pain and constipation.  Genitourinary:  Negative for dysuria and urgency.  Musculoskeletal:  Positive for arthralgias, back pain and gait problem.  Skin:  Negative for color change.  Neurological:  Negative for speech difficulty, weakness and headaches.       Dementia.   Psychiatric/Behavioral:  Positive for dysphoric mood and sleep disturbance. Negative for confusion and hallucinations. The patient is not nervous/anxious.     Immunization History  Administered Date(s) Administered   DT (Pediatric) 04/10/2002   Fluad Quad(high Dose 65+) 10/28/2019   Influenza Split 03/08/2011, 11/17/2011   Influenza Whole 01/09/2007, 11/05/2008, 11/04/2009   Influenza, High Dose Seasonal PF 12/07/2015, 09/29/2016, 11/09/2017, 09/27/2018, 10/28/2019   Influenza,trivalent, recombinat, inj, PF 03/08/2011, 11/17/2011   Influenza-Unspecified 10/30/2012, 11/29/2013, 12/02/2014, 11/25/2020   Moderna Covid-19 Vaccine Bivalent Booster 54yr & up 12/09/2021   Moderna Sars-Covid-2 Vaccination 02/08/2019, 03/11/2019   PFIZER(Purple Top)SARS-COV-2 Vaccination 10/09/2019   PPD Test 12/22/2015, 01/05/2016   Pneumococcal Conjugate-13 07/17/2014   Pneumococcal Polysaccharide-23 08/15/2005, 09/25/2015   Tetanus 12/24/2012   Unspecified SARS-COV-2 Vaccination 02/11/2019, 12/17/2019, 10/27/2020   Zoster Recombinat (Shingrix) 06/07/2019, 09/06/2019   Zoster, Live 12/05/2013   Pertinent  Health Maintenance Due  Topic Date Due   INFLUENZA VACCINE   09/07/2021   DEXA SCAN  Completed      01/12/2021   10:01 PM 01/13/2021    8:14 AM 06/30/2021    3:06 PM 11/09/2021   11:37 AM 01/18/2022   10:20 AM  Fall Risk  Falls in the past year?   0 0 0  Was there an injury with Fall?   0 0 0  Fall Risk Category Calculator   0 0 0  Fall Risk Category   Low Low Low  Patient Fall Risk Level   High fall risk Low fall risk Low fall risk  Patient at Risk for Falls Due to   Impaired mobility;Impaired balance/gait;History of fall(s) No Fall Risks No Fall Risks  Fall risk Follow up    Falls evaluation completed Falls evaluation completed     Information is confidential and restricted. Go to Review Flowsheets to unlock data.   Functional Status Survey:    Vitals:   01/18/22 1034  BP: 138/70  Pulse: 77  Resp: 17  Temp: 97.9 F (36.6 C)  SpO2: (!) 17%  Weight: 83 lb 11.2 oz (38 kg)  Height: '5\' 4"'$  (1.626 m)   Body mass index is 14.37 kg/m. Physical Exam Vitals and nursing note reviewed.  Constitutional:      Appearance: Normal appearance.  HENT:     Head: Normocephalic.     Nose: Nose normal.     Mouth/Throat:     Mouth: Mucous membranes are moist.  Eyes:     General:        Right eye: No discharge.        Left eye: No discharge.  Cardiovascular:     Rate and Rhythm: Normal rate.     Heart sounds: No murmur heard. Pulmonary:     Effort: Pulmonary effort is normal.     Breath sounds: No rales.  Abdominal:     General: Bowel sounds are normal.     Palpations: Abdomen is  soft.     Tenderness: There is no abdominal tenderness.  Musculoskeletal:     Cervical back: Normal range of motion and neck supple.     Right lower leg: No edema.     Left lower leg: No edema.     Comments: Right hip s/p ORIF 09/08/20, healed.   Skin:    General: Skin is warm and dry.  Neurological:     General: No focal deficit present.     Mental Status: She is alert and oriented to person, place, and time. Mental status is at baseline.     Motor: No  weakness.     Coordination: Coordination normal.     Gait: Gait abnormal.     Comments: Ambulates with walker.   Psychiatric:        Mood and Affect: Mood normal.        Behavior: Behavior normal.     Comments: Confused.      Labs reviewed: Recent Labs    02/25/21 1533 06/01/21 0000 06/30/21 1605 07/22/21 0000  NA 139 140 137 138  K 4.5 4.2 4.4 3.9  CL 101 101 98 101  CO2 31 30* 31 32*  GLUCOSE 89  --  91  --   BUN '19 17 23 17  '$ CREATININE 0.74 0.6 0.68 0.7  CALCIUM 9.9 9.2 10.1 8.8   Recent Labs    02/25/21 1533 06/01/21 0000 06/30/21 1605 07/22/21 0000  AST '21 23 25 21  '$ ALT '13 17 20 16  '$ ALKPHOS 61 67 62  --   BILITOT 0.4  --  0.4  --   PROT 7.3  --  7.6  --   ALBUMIN 4.1 4.0 4.3 3.7   Recent Labs    06/01/21 0000 06/30/21 1605 07/22/21 0000  WBC 5.5 6.5 4.6  NEUTROABS 3,031.00 4.1  --   HGB 13.8 13.3 12.5  HCT 43 40.8 38  MCV  --  92.8  --   PLT 180 199.0 206   Lab Results  Component Value Date   TSH 2.25 06/30/2021   Lab Results  Component Value Date   HGBA1C 5.5 01/06/2021   Lab Results  Component Value Date   CHOL 182 01/06/2021   HDL 58 01/06/2021   LDLCALC 117 (H) 01/06/2021   LDLDIRECT 135.4 12/26/2008   TRIG 37 01/06/2021   CHOLHDL 3.1 01/06/2021    Significant Diagnostic Results in last 30 days:  No results found.  Assessment/Plan HTN (hypertension)  Blood pressure is controlled, off Amlodipine. Bun/creat 17/0.7 07/22/21  Slow transit constipation Stable, takes Senokot S, MiraLax.   Alzheimer disease ([place])  TSH 2.25, Vit B12 >1504 06/30/21,  SNF [facility] for supportive care. MMSE 27/30 04/08/21  Vitamin B12 deficiency  Vit B12 level >1504 06/30/21, takes Vit B12  Vitamin D deficiency takes Vit d daily, Vit D 56.33 06/30/21  Allergic rhinitis takes Zyrtec.  Hyperlipidemia diet, LDL 117 01/06/21  Depression with suicidal ideation hx of suicidal attempt/ideations, anxious at times,  on Prozac, Zyprexa, Depakote, Mirtazapine  made her AMS  Low back pain  stable, T11, T12, L1 compression fx, s/p T12 Kyphoplasty, takes Tylenol, Gabapentin  Failure to thrive in adult  Weight loss/adult failure to thrive, gradual weight loss, didn't tolerate Mirtazapine.  Thrombocytopenia ([place]) plt 206 07/22/21  Blood loss anemia Iron 58, Hgb 13.3 06/30/21  Osteoporosis never received Evenity, taking Vit D, Ca. pending repeated DEXA, may consider Prolia.      Family/ staff Communication: plan of care reviewed  with the patient and charge nurse.   Labs/tests ordered:  none  Time spend 35 minutes.

## 2022-01-18 NOTE — Assessment & Plan Note (Signed)
Blood pressure is controlled, off Amlodipine. Bun/creat 17/0.7 07/22/21

## 2022-01-19 DIAGNOSIS — R2681 Unsteadiness on feet: Secondary | ICD-10-CM | POA: Diagnosis not present

## 2022-01-19 DIAGNOSIS — G309 Alzheimer's disease, unspecified: Secondary | ICD-10-CM | POA: Diagnosis not present

## 2022-01-19 DIAGNOSIS — R41841 Cognitive communication deficit: Secondary | ICD-10-CM | POA: Diagnosis not present

## 2022-01-19 DIAGNOSIS — N3946 Mixed incontinence: Secondary | ICD-10-CM | POA: Diagnosis not present

## 2022-01-21 DIAGNOSIS — N3946 Mixed incontinence: Secondary | ICD-10-CM | POA: Diagnosis not present

## 2022-01-21 DIAGNOSIS — R41841 Cognitive communication deficit: Secondary | ICD-10-CM | POA: Diagnosis not present

## 2022-01-21 DIAGNOSIS — G309 Alzheimer's disease, unspecified: Secondary | ICD-10-CM | POA: Diagnosis not present

## 2022-01-21 DIAGNOSIS — R2681 Unsteadiness on feet: Secondary | ICD-10-CM | POA: Diagnosis not present

## 2022-01-24 DIAGNOSIS — G309 Alzheimer's disease, unspecified: Secondary | ICD-10-CM | POA: Diagnosis not present

## 2022-01-24 DIAGNOSIS — N3946 Mixed incontinence: Secondary | ICD-10-CM | POA: Diagnosis not present

## 2022-01-24 DIAGNOSIS — R2681 Unsteadiness on feet: Secondary | ICD-10-CM | POA: Diagnosis not present

## 2022-01-24 DIAGNOSIS — R41841 Cognitive communication deficit: Secondary | ICD-10-CM | POA: Diagnosis not present

## 2022-01-25 ENCOUNTER — Encounter: Payer: Self-pay | Admitting: Nurse Practitioner

## 2022-01-25 DIAGNOSIS — R2681 Unsteadiness on feet: Secondary | ICD-10-CM | POA: Diagnosis not present

## 2022-01-25 DIAGNOSIS — N3946 Mixed incontinence: Secondary | ICD-10-CM | POA: Diagnosis not present

## 2022-01-25 DIAGNOSIS — R41841 Cognitive communication deficit: Secondary | ICD-10-CM | POA: Diagnosis not present

## 2022-01-25 DIAGNOSIS — G309 Alzheimer's disease, unspecified: Secondary | ICD-10-CM | POA: Diagnosis not present

## 2022-01-26 DIAGNOSIS — G309 Alzheimer's disease, unspecified: Secondary | ICD-10-CM | POA: Diagnosis not present

## 2022-01-26 DIAGNOSIS — R2681 Unsteadiness on feet: Secondary | ICD-10-CM | POA: Diagnosis not present

## 2022-01-26 DIAGNOSIS — N3946 Mixed incontinence: Secondary | ICD-10-CM | POA: Diagnosis not present

## 2022-01-26 DIAGNOSIS — R41841 Cognitive communication deficit: Secondary | ICD-10-CM | POA: Diagnosis not present

## 2022-01-26 NOTE — Progress Notes (Signed)
This encounter was created in error - please disregard.

## 2022-01-27 DIAGNOSIS — N3946 Mixed incontinence: Secondary | ICD-10-CM | POA: Diagnosis not present

## 2022-01-27 DIAGNOSIS — R2681 Unsteadiness on feet: Secondary | ICD-10-CM | POA: Diagnosis not present

## 2022-01-27 DIAGNOSIS — R41841 Cognitive communication deficit: Secondary | ICD-10-CM | POA: Diagnosis not present

## 2022-01-27 DIAGNOSIS — G309 Alzheimer's disease, unspecified: Secondary | ICD-10-CM | POA: Diagnosis not present

## 2022-01-28 DIAGNOSIS — F411 Generalized anxiety disorder: Secondary | ICD-10-CM | POA: Diagnosis not present

## 2022-01-28 DIAGNOSIS — F332 Major depressive disorder, recurrent severe without psychotic features: Secondary | ICD-10-CM | POA: Diagnosis not present

## 2022-01-28 DIAGNOSIS — G4700 Insomnia, unspecified: Secondary | ICD-10-CM | POA: Diagnosis not present

## 2022-01-28 DIAGNOSIS — F03A3 Unspecified dementia, mild, with mood disturbance: Secondary | ICD-10-CM | POA: Diagnosis not present

## 2022-02-01 DIAGNOSIS — R41841 Cognitive communication deficit: Secondary | ICD-10-CM | POA: Diagnosis not present

## 2022-02-01 DIAGNOSIS — G309 Alzheimer's disease, unspecified: Secondary | ICD-10-CM | POA: Diagnosis not present

## 2022-02-01 DIAGNOSIS — N3946 Mixed incontinence: Secondary | ICD-10-CM | POA: Diagnosis not present

## 2022-02-01 DIAGNOSIS — R2681 Unsteadiness on feet: Secondary | ICD-10-CM | POA: Diagnosis not present

## 2022-02-03 DIAGNOSIS — N3946 Mixed incontinence: Secondary | ICD-10-CM | POA: Diagnosis not present

## 2022-02-03 DIAGNOSIS — G309 Alzheimer's disease, unspecified: Secondary | ICD-10-CM | POA: Diagnosis not present

## 2022-02-03 DIAGNOSIS — R2681 Unsteadiness on feet: Secondary | ICD-10-CM | POA: Diagnosis not present

## 2022-02-03 DIAGNOSIS — R41841 Cognitive communication deficit: Secondary | ICD-10-CM | POA: Diagnosis not present

## 2022-02-04 ENCOUNTER — Encounter: Payer: Self-pay | Admitting: Nurse Practitioner

## 2022-02-05 DIAGNOSIS — R2681 Unsteadiness on feet: Secondary | ICD-10-CM | POA: Diagnosis not present

## 2022-02-05 DIAGNOSIS — R41841 Cognitive communication deficit: Secondary | ICD-10-CM | POA: Diagnosis not present

## 2022-02-05 DIAGNOSIS — G309 Alzheimer's disease, unspecified: Secondary | ICD-10-CM | POA: Diagnosis not present

## 2022-02-05 DIAGNOSIS — N3946 Mixed incontinence: Secondary | ICD-10-CM | POA: Diagnosis not present

## 2022-02-07 DIAGNOSIS — M6281 Muscle weakness (generalized): Secondary | ICD-10-CM | POA: Diagnosis not present

## 2022-02-07 DIAGNOSIS — N3946 Mixed incontinence: Secondary | ICD-10-CM | POA: Diagnosis not present

## 2022-02-07 DIAGNOSIS — R2681 Unsteadiness on feet: Secondary | ICD-10-CM | POA: Diagnosis not present

## 2022-02-07 DIAGNOSIS — R41841 Cognitive communication deficit: Secondary | ICD-10-CM | POA: Diagnosis not present

## 2022-02-08 DIAGNOSIS — M6281 Muscle weakness (generalized): Secondary | ICD-10-CM | POA: Diagnosis not present

## 2022-02-08 DIAGNOSIS — R41841 Cognitive communication deficit: Secondary | ICD-10-CM | POA: Diagnosis not present

## 2022-02-08 DIAGNOSIS — R2681 Unsteadiness on feet: Secondary | ICD-10-CM | POA: Diagnosis not present

## 2022-02-08 DIAGNOSIS — N3946 Mixed incontinence: Secondary | ICD-10-CM | POA: Diagnosis not present

## 2022-02-09 DIAGNOSIS — M6281 Muscle weakness (generalized): Secondary | ICD-10-CM | POA: Diagnosis not present

## 2022-02-09 DIAGNOSIS — R2681 Unsteadiness on feet: Secondary | ICD-10-CM | POA: Diagnosis not present

## 2022-02-09 DIAGNOSIS — R41841 Cognitive communication deficit: Secondary | ICD-10-CM | POA: Diagnosis not present

## 2022-02-09 DIAGNOSIS — N3946 Mixed incontinence: Secondary | ICD-10-CM | POA: Diagnosis not present

## 2022-02-10 DIAGNOSIS — N3946 Mixed incontinence: Secondary | ICD-10-CM | POA: Diagnosis not present

## 2022-02-10 DIAGNOSIS — R2681 Unsteadiness on feet: Secondary | ICD-10-CM | POA: Diagnosis not present

## 2022-02-10 DIAGNOSIS — R41841 Cognitive communication deficit: Secondary | ICD-10-CM | POA: Diagnosis not present

## 2022-02-10 DIAGNOSIS — M6281 Muscle weakness (generalized): Secondary | ICD-10-CM | POA: Diagnosis not present

## 2022-02-13 DIAGNOSIS — M6281 Muscle weakness (generalized): Secondary | ICD-10-CM | POA: Diagnosis not present

## 2022-02-13 DIAGNOSIS — R41841 Cognitive communication deficit: Secondary | ICD-10-CM | POA: Diagnosis not present

## 2022-02-13 DIAGNOSIS — R2681 Unsteadiness on feet: Secondary | ICD-10-CM | POA: Diagnosis not present

## 2022-02-13 DIAGNOSIS — N3946 Mixed incontinence: Secondary | ICD-10-CM | POA: Diagnosis not present

## 2022-02-14 DIAGNOSIS — N3946 Mixed incontinence: Secondary | ICD-10-CM | POA: Diagnosis not present

## 2022-02-14 DIAGNOSIS — R2681 Unsteadiness on feet: Secondary | ICD-10-CM | POA: Diagnosis not present

## 2022-02-14 DIAGNOSIS — M6281 Muscle weakness (generalized): Secondary | ICD-10-CM | POA: Diagnosis not present

## 2022-02-14 DIAGNOSIS — R41841 Cognitive communication deficit: Secondary | ICD-10-CM | POA: Diagnosis not present

## 2022-02-15 DIAGNOSIS — R2681 Unsteadiness on feet: Secondary | ICD-10-CM | POA: Diagnosis not present

## 2022-02-15 DIAGNOSIS — M6281 Muscle weakness (generalized): Secondary | ICD-10-CM | POA: Diagnosis not present

## 2022-02-15 DIAGNOSIS — R41841 Cognitive communication deficit: Secondary | ICD-10-CM | POA: Diagnosis not present

## 2022-02-15 DIAGNOSIS — N3946 Mixed incontinence: Secondary | ICD-10-CM | POA: Diagnosis not present

## 2022-02-16 DIAGNOSIS — M6281 Muscle weakness (generalized): Secondary | ICD-10-CM | POA: Diagnosis not present

## 2022-02-16 DIAGNOSIS — R41841 Cognitive communication deficit: Secondary | ICD-10-CM | POA: Diagnosis not present

## 2022-02-16 DIAGNOSIS — R2681 Unsteadiness on feet: Secondary | ICD-10-CM | POA: Diagnosis not present

## 2022-02-16 DIAGNOSIS — N3946 Mixed incontinence: Secondary | ICD-10-CM | POA: Diagnosis not present

## 2022-02-17 DIAGNOSIS — R2681 Unsteadiness on feet: Secondary | ICD-10-CM | POA: Diagnosis not present

## 2022-02-17 DIAGNOSIS — M6281 Muscle weakness (generalized): Secondary | ICD-10-CM | POA: Diagnosis not present

## 2022-02-17 DIAGNOSIS — N3946 Mixed incontinence: Secondary | ICD-10-CM | POA: Diagnosis not present

## 2022-02-17 DIAGNOSIS — R41841 Cognitive communication deficit: Secondary | ICD-10-CM | POA: Diagnosis not present

## 2022-02-22 DIAGNOSIS — N3946 Mixed incontinence: Secondary | ICD-10-CM | POA: Diagnosis not present

## 2022-02-22 DIAGNOSIS — M6281 Muscle weakness (generalized): Secondary | ICD-10-CM | POA: Diagnosis not present

## 2022-02-22 DIAGNOSIS — R2681 Unsteadiness on feet: Secondary | ICD-10-CM | POA: Diagnosis not present

## 2022-02-22 DIAGNOSIS — R41841 Cognitive communication deficit: Secondary | ICD-10-CM | POA: Diagnosis not present

## 2022-02-23 DIAGNOSIS — R41841 Cognitive communication deficit: Secondary | ICD-10-CM | POA: Diagnosis not present

## 2022-02-23 DIAGNOSIS — N3946 Mixed incontinence: Secondary | ICD-10-CM | POA: Diagnosis not present

## 2022-02-23 DIAGNOSIS — M6281 Muscle weakness (generalized): Secondary | ICD-10-CM | POA: Diagnosis not present

## 2022-02-23 DIAGNOSIS — R2681 Unsteadiness on feet: Secondary | ICD-10-CM | POA: Diagnosis not present

## 2022-02-24 DIAGNOSIS — R41841 Cognitive communication deficit: Secondary | ICD-10-CM | POA: Diagnosis not present

## 2022-02-24 DIAGNOSIS — M6281 Muscle weakness (generalized): Secondary | ICD-10-CM | POA: Diagnosis not present

## 2022-02-24 DIAGNOSIS — N3946 Mixed incontinence: Secondary | ICD-10-CM | POA: Diagnosis not present

## 2022-02-24 DIAGNOSIS — R2681 Unsteadiness on feet: Secondary | ICD-10-CM | POA: Diagnosis not present

## 2022-02-25 ENCOUNTER — Non-Acute Institutional Stay (SKILLED_NURSING_FACILITY): Payer: Medicare Other | Admitting: Nurse Practitioner

## 2022-02-25 ENCOUNTER — Encounter: Payer: Self-pay | Admitting: Nurse Practitioner

## 2022-02-25 DIAGNOSIS — E785 Hyperlipidemia, unspecified: Secondary | ICD-10-CM

## 2022-02-25 DIAGNOSIS — F32A Depression, unspecified: Secondary | ICD-10-CM

## 2022-02-25 DIAGNOSIS — E538 Deficiency of other specified B group vitamins: Secondary | ICD-10-CM | POA: Diagnosis not present

## 2022-02-25 DIAGNOSIS — D500 Iron deficiency anemia secondary to blood loss (chronic): Secondary | ICD-10-CM | POA: Diagnosis not present

## 2022-02-25 DIAGNOSIS — K5901 Slow transit constipation: Secondary | ICD-10-CM

## 2022-02-25 DIAGNOSIS — F0280 Dementia in other diseases classified elsewhere without behavioral disturbance: Secondary | ICD-10-CM | POA: Diagnosis not present

## 2022-02-25 DIAGNOSIS — J3089 Other allergic rhinitis: Secondary | ICD-10-CM | POA: Diagnosis not present

## 2022-02-25 DIAGNOSIS — M810 Age-related osteoporosis without current pathological fracture: Secondary | ICD-10-CM | POA: Diagnosis not present

## 2022-02-25 DIAGNOSIS — R45851 Suicidal ideations: Secondary | ICD-10-CM

## 2022-02-25 DIAGNOSIS — I10 Essential (primary) hypertension: Secondary | ICD-10-CM | POA: Diagnosis not present

## 2022-02-25 DIAGNOSIS — G309 Alzheimer's disease, unspecified: Secondary | ICD-10-CM | POA: Diagnosis not present

## 2022-02-25 DIAGNOSIS — E559 Vitamin D deficiency, unspecified: Secondary | ICD-10-CM

## 2022-02-25 NOTE — Progress Notes (Signed)
Location:  [place] Room Number: 30-A Place of Service:  SNF (31) Provider:  ManXie Maui Ahart,NP  Shaneece Stockburger X, NP  Patient Care Team: Shabree Tebbetts X, NP as PCP - General (Internal Medicine)  Extended Emergency Contact Information Primary Emergency Contact: Idalee, [place] Address: [address]          [place], [place] [address] [place] Phone: [phone] Relation: Spouse Secondary Emergency Contact: [facility] Phone: [phone] Relation: Daughter  Code Status:  Full Code Goals of care: Advanced Directive information    02/25/2022    8:43 AM  Advanced Directives  Does Patient Have a Medical Advance Directive? Yes  Type of Paramedic of [place];Living will  Does patient want to make changes to medical advance directive? No - Patient declined  Copy of [place] in Chart? Yes - validated most recent copy scanned in chart (See row information)     Chief Complaint  Patient presents with   Routine    HPI:  Pt is a 82 y.o. female seen today for medical management of chronic diseases.    OP, never received Evenity, taking Vit D, Ca. pending repeated DEXA, may consider Prolia.              Anemia, Iron 58, Hgb 13.3 06/30/21             HTN, off Amlodipine. Bun/creat 17/0.7 07/22/21             Constipation, takes Senokot S, MiraLax.              Alzheimer's Dementia, TSH 2.25, Vit B12 >1504 06/30/21,  SNF [facility] for supportive care. MMSE 27/30 04/08/21             Vit B12 deficiency, Vit B12 level >1504 06/30/21, takes Vit B12             Vitamin D deficiency, takes Vit d daily, Vit D 56.33 06/30/21             Allergic rhinitis, takes Zyrtec.             Hyperlipidemia, diet, LDL 117 01/06/21             Depression, hx of suicidal attempt/ideations, anxious at times,  on Prozac, Zyprexa, Depakote, Mirtazapine made her AMS             Chronic back pain, stable, T11, T12, L1 compression fx, s/p  T12 Kyphoplasty, takes Tylenol, Gabapentin             Weight loss/adult failure to thrive, gradual weight loss, didn't tolerate Mirtazapine.             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   Depression    years ago   Headache    sinus headaches   Lipoma of arm    Right   Medical history non-contributory    Osteoporosis    SVD (spontaneous vaginal delivery)    x 2   Varicose vein    Vitamin D deficiency    Past Surgical History:  Procedure Laterality Date   ANTERIOR APPROACH HEMI HIP ARTHROPLASTY Right 09/04/2020   Procedure: ANTERIOR APPROACH HEMI HIP ARTHROPLASTY;  Surgeon: Rod Can, MD;  Location: [facility];  Service: Orthopedics;  Laterality: Right;   APPENDECTOMY  [place]     x2 left and right arm   HYSTEROSCOPY WITH D & C N/A 07/15/2013   Procedure: DILATATION AND CURETTAGE /HYSTEROSCOPY;  Surgeon: Lovenia Kim, MD;  Location: [facility];  Service: Gynecology;  Laterality: N/A;   KYPHOPLASTY Bilateral 05/04/2020   Procedure: KYPHOPLASTY THORACIC TWELVE;  Surgeon: Vallarie Mare, MD;  Location: [facility];  Service: Neurosurgery;  Laterality: Bilateral;   MANDIBLE SURGERY  1954   WISDOM TOOTH EXTRACTION      Allergies  Allergen Reactions   Actonel [Risedronate] Other (See Comments)    "Allergic," per MAR   Alendronate Sodium Other (See Comments)    Leg cramps and "Allergic," per MAR   Fosamax [Alendronate]    Remeron [Mirtazapine]    Risedronate Sodium Other (See Comments)    Caused the patient to be achy and is "Allergic," per MAR   Tramadol Other (See Comments)    Caused the patient to feel badly    Keflex [Cephalexin] Other (See Comments)    Possible rash    Outpatient Encounter Medications as of 02/25/2022  Medication Sig   acetaminophen (TYLENOL) 325 MG tablet Take 650 mg by mouth in the  morning and at bedtime.   acetaminophen (TYLENOL) 500 MG tablet Take 1,000 mg by mouth every 8 (eight) hours as needed for moderate pain, mild pain, headache or fever.   ASPIRIN 81 PO Take 1 tablet by mouth daily.   Calcium Carbonate (CALCIUM-CARB 600 PO) Take 1 tablet by mouth 2 (two) times daily.   Camphor-Menthol-Methyl Sal 3.02-13-08 % PTCH Apply 1 patch topically daily as needed.   cetirizine (ZYRTEC ALLERGY) 10 MG tablet Take 1 tablet (10 mg total) by mouth daily.   Cholecalciferol (VITAMIN D3) 2000 units capsule Take 1 capsule (2,000 Units total) by mouth daily.   Cobalamin Combinations (VITAMIN B12-FOLIC ACID PO) Place 1 lozenge under the tongue in the morning.   divalproex (DEPAKOTE SPRINKLE) 125 MG capsule Take 125 mg by mouth 2 (two) times daily.   docusate sodium (COLACE) 100 MG capsule Take 100 mg by mouth at bedtime.   FLUoxetine (PROZAC) 20 MG capsule Take 1 capsule (20 mg total) by mouth at bedtime.   gabapentin (NEURONTIN) 100 MG capsule Take 1 capsule (100 mg total) by mouth 3 (three) times daily.   KETOCONAZOLE, TOPICAL, (NIZORAL A-D) 1 % SHAM Apply 1 application. topically once a week. Monday   Liniments (BLUE-EMU SUPER STRENGTH) CREA Apply 4 oz topically. Apply thin layer to the back.   melatonin 5 MG TABS Take 5 mg by mouth at bedtime.   Multiple Vitamins-Minerals (PRESERVISION AREDS 2 PO) Take 1 capsule by mouth 2 (two) times daily.   OLANZapine (ZYPREXA) 7.5 MG tablet Take 7.5 mg by mouth at bedtime.   polyethylene glycol (MIRALAX / GLYCOLAX) 17 g packet Take 17 g by mouth daily.   senna-docusate (SENOKOT-S) 8.6-50 MG tablet Take 2 tablets by mouth daily.   sodium phosphate (FLEET) 7-19 GM/118ML ENEM Place 1 enema rectally daily as needed for severe constipation or mild constipation.   No facility-administered encounter medications on file as of 02/25/2022.    Review of Systems  Constitutional:  Negative for activity change, appetite change and fever.  HENT:  Positive  for hearing loss. Negative for congestion and voice change.   Eyes:  Negative for visual disturbance.  Respiratory:  Negative for cough and shortness of breath.   Cardiovascular:  Negative for leg swelling.  Gastrointestinal:  Negative for abdominal pain and constipation.  Genitourinary:  Negative for dysuria and urgency.  Musculoskeletal:  Positive for arthralgias, back pain and gait problem.  Skin:  Negative for color change.  Neurological:  Negative for speech difficulty, weakness and headaches.       Dementia.   Psychiatric/Behavioral:  Positive for dysphoric mood and sleep disturbance. Negative for confusion and hallucinations. The patient is not nervous/anxious.     Immunization History  Administered Date(s) Administered   DT (Pediatric) 04/10/2002   Fluad Quad(high Dose 65+) 10/28/2019   Influenza Split 03/08/2011, 11/17/2011   Influenza Whole 01/09/2007, 11/05/2008, 11/04/2009   Influenza, High Dose Seasonal PF 12/07/2015, 09/29/2016, 11/09/2017, 09/27/2018, 10/28/2019   Influenza,trivalent, recombinat, inj, PF 03/08/2011, 11/17/2011   Influenza-Unspecified 10/30/2012, 11/29/2013, 12/02/2014, 11/25/2020   Moderna Covid-19 Vaccine Bivalent Booster 85yr & up 12/09/2021   Moderna Sars-Covid-2 Vaccination 02/08/2019, 03/11/2019   PFIZER(Purple Top)SARS-COV-2 Vaccination 10/09/2019   PPD Test 12/22/2015, 01/05/2016   Pneumococcal Conjugate-13 07/17/2014   Pneumococcal Polysaccharide-23 08/15/2005, 09/25/2015   Tetanus 12/24/2012   Unspecified SARS-COV-2 Vaccination 02/11/2019, 12/17/2019, 10/27/2020   Zoster Recombinat (Shingrix) 06/07/2019, 09/06/2019   Zoster, Live 12/05/2013   Pertinent  Health Maintenance Due  Topic Date Due   INFLUENZA VACCINE  09/07/2021   DEXA SCAN  Completed      01/12/2021   10:01 PM 01/13/2021    8:14 AM 06/30/2021    3:06 PM 11/09/2021   11:37 AM 01/18/2022   10:20 AM  Fall Risk  Falls in the past year?   0 0 0  Was there an injury with Fall?    0 0 0  Fall Risk Category Calculator   0 0 0  Fall Risk Category (Retired)   Low Low Low  (RETIRED) Patient Fall Risk Level   High fall risk Low fall risk Low fall risk  Patient at Risk for Falls Due to   Impaired mobility;Impaired balance/gait;History of fall(s) No Fall Risks No Fall Risks  Fall risk Follow up    Falls evaluation completed Falls evaluation completed     Information is confidential and restricted. Go to Review Flowsheets to unlock data.   Functional Status Survey:    Vitals:   02/25/22 0833  BP: 120/60  Pulse: 68  Resp: 16  Temp: 97.7 F (36.5 C)  SpO2: 95%  Weight: 83 lb 9.6 oz (37.9 kg)  Height: '5\' 4"'$  (1.626 m)   Body mass index is 14.35 kg/m. Physical Exam Vitals and nursing note reviewed.  Constitutional:      Appearance: Normal appearance.  HENT:     Head: Normocephalic.     Nose: Nose normal.     Mouth/Throat:     Mouth: Mucous membranes are moist.  Eyes:     General:        Right eye: No discharge.        Left eye: No discharge.  Cardiovascular:     Rate and Rhythm: Normal rate.     Heart sounds: No murmur heard. Pulmonary:     Effort: Pulmonary effort is normal.     Breath sounds: No rales.  Abdominal:     General: Bowel sounds are normal.     Palpations: Abdomen is soft.     Tenderness: There is no abdominal tenderness.  Musculoskeletal:     Cervical back: Normal range of motion and neck supple.     Right lower leg: No edema.     Left lower leg:  No edema.     Comments: Right hip s/p ORIF 09/08/20, healed.   Skin:    General: Skin is warm and dry.  Neurological:     General: No focal deficit present.     Mental Status: She is alert and oriented to person, place, and time. Mental status is at baseline.     Motor: No weakness.     Coordination: Coordination normal.     Gait: Gait abnormal.     Comments: Ambulates with walker.   Psychiatric:        Mood and Affect: Mood normal.        Behavior: Behavior normal.     Comments:  Confused.      Labs reviewed: Recent Labs    06/01/21 0000 06/30/21 1605 07/22/21 0000  NA 140 137 138  K 4.2 4.4 3.9  CL 101 98 101  CO2 30* 31 32*  GLUCOSE  --  91  --   BUN '17 23 17  '$ CREATININE 0.6 0.68 0.7  CALCIUM 9.2 10.1 8.8   Recent Labs    06/01/21 0000 06/30/21 1605 07/22/21 0000  AST '23 25 21  '$ ALT '17 20 16  '$ ALKPHOS 67 62  --   BILITOT  --  0.4  --   PROT  --  7.6  --   ALBUMIN 4.0 4.3 3.7   Recent Labs    06/01/21 0000 06/30/21 1605 07/22/21 0000  WBC 5.5 6.5 4.6  NEUTROABS 3,031.00 4.1  --   HGB 13.8 13.3 12.5  HCT 43 40.8 38  MCV  --  92.8  --   PLT 180 199.0 206   Lab Results  Component Value Date   TSH 2.25 06/30/2021   Lab Results  Component Value Date   HGBA1C 5.5 01/06/2021   Lab Results  Component Value Date   CHOL 182 01/06/2021   HDL 58 01/06/2021   LDLCALC 117 (H) 01/06/2021   LDLDIRECT 135.4 12/26/2008   TRIG 37 01/06/2021   CHOLHDL 3.1 01/06/2021    Significant Diagnostic Results in last 30 days:  No results found.  Assessment/Plan Osteoporosis never received Evenity, taking Vit D, Ca. pending repeated DEXA, may consider Prolia.   Blood loss anemia Iron 58, Hgb 13.3 06/30/21  HTN (hypertension) Controlled blood pressure, off Amlodipine. Bun/creat 17/0.7 07/22/21  Slow transit constipation Stable,  takes Senokot S, MiraLax.   Alzheimer disease ([place])  TSH 2.25, Vit B12 >1504 06/30/21,  SNF [facility] for supportive care. MMSE 27/30 04/08/21  Vitamin B12 deficiency  Vit B12 level >1504 06/30/21, takes Vit B12  Vitamin D deficiency  takes Vit d daily, Vit D 56.33 06/30/21  Allergic rhinitis , takes Zyrtec.  Hyperlipidemia diet, LDL 117 01/06/21  Depression with suicidal ideation  hx of suicidal attempt/ideations, anxious at times,  on Prozac, Zyprexa, Depakote, Mirtazapine made her AMS  Low back pain stable, T11, T12, L1 compression fx, s/p T12 Kyphoplasty, takes Tylenol, Gabapentin, ambulates with walker.      Family/ staff Communication: plan of care reviewed with the patient and charge nurse.   Labs/tests ordered:  none  Time spend 35 minutes.

## 2022-02-25 NOTE — Assessment & Plan Note (Signed)
Iron 58, Hgb 13.3 06/30/21

## 2022-02-25 NOTE — Assessment & Plan Note (Signed)
takes Vit d daily, Vit D 56.33 06/30/21

## 2022-02-25 NOTE — Assessment & Plan Note (Signed)
TSH 2.25, Vit B12 >1504 06/30/21,  SNF [facility] for supportive care. MMSE 27/30 04/08/21

## 2022-02-25 NOTE — Assessment & Plan Note (Signed)
Controlled blood pressure, off Amlodipine. Bun/creat 17/0.7 07/22/21

## 2022-02-25 NOTE — Assessment & Plan Note (Signed)
,   takes Zyrtec.

## 2022-02-25 NOTE — Assessment & Plan Note (Signed)
Stable, takes Senokot S, MiraLax. 

## 2022-02-25 NOTE — Assessment & Plan Note (Signed)
Vit B12 level >1504 06/30/21, takes Vit B12

## 2022-02-25 NOTE — Assessment & Plan Note (Signed)
never received Evenity, taking Vit D, Ca. pending repeated DEXA, may consider Prolia.

## 2022-02-25 NOTE — Assessment & Plan Note (Signed)
hx of suicidal attempt/ideations, anxious at times,  on Prozac, Zyprexa, Depakote, Mirtazapine made her AMS

## 2022-02-25 NOTE — Assessment & Plan Note (Signed)
stable, T11, T12, L1 compression fx, s/p T12 Kyphoplasty, takes Tylenol, Gabapentin, ambulates with walker.

## 2022-02-25 NOTE — Assessment & Plan Note (Signed)
diet, LDL 117 01/06/21

## 2022-02-28 ENCOUNTER — Non-Acute Institutional Stay (SKILLED_NURSING_FACILITY): Payer: Medicare Other | Admitting: Nurse Practitioner

## 2022-02-28 ENCOUNTER — Encounter: Payer: Self-pay | Admitting: Nurse Practitioner

## 2022-02-28 DIAGNOSIS — M6281 Muscle weakness (generalized): Secondary | ICD-10-CM | POA: Diagnosis not present

## 2022-02-28 DIAGNOSIS — R41841 Cognitive communication deficit: Secondary | ICD-10-CM | POA: Diagnosis not present

## 2022-02-28 DIAGNOSIS — Z0000 Encounter for general adult medical examination without abnormal findings: Secondary | ICD-10-CM | POA: Diagnosis not present

## 2022-02-28 DIAGNOSIS — R2681 Unsteadiness on feet: Secondary | ICD-10-CM | POA: Diagnosis not present

## 2022-02-28 DIAGNOSIS — N3946 Mixed incontinence: Secondary | ICD-10-CM | POA: Diagnosis not present

## 2022-02-28 NOTE — Progress Notes (Signed)
Subjective:   Tanya Walker is a 82 y.o. female who presents for Medicare Annual (Subsequent) preventive examination @ [facility].        Objective:    Today's Vitals   02/28/22 1247  BP: 120/60  Pulse: 68  Resp: 16  Temp: 97.7 F (36.5 C)  SpO2: 95%  Weight: 83 lb 9.6 oz (37.9 kg)  Height: '5\' 4"'$  (1.626 m)   Body mass index is 14.35 kg/m.     02/28/2022   12:50 PM 02/25/2022    8:43 AM 01/25/2022   11:05 AM 01/18/2022   10:20 AM 11/09/2021   11:37 AM 10/12/2021    9:24 AM 09/28/2021    2:24 PM  Advanced Directives  Does Patient Have a Medical Advance Directive? Yes Yes No;Yes No;Yes Yes Yes Yes  Type of Paramedic of [place];Living will [place];Living will Healthcare Power of [place];Living will [place];Living will [place];Living will  Does patient want to make changes to medical advance directive? No - Patient declined No - Patient declined No - Patient declined  No - Patient declined No - Patient declined No - Patient declined  Copy of [place] in Chart? Yes - validated most recent copy scanned in chart (See row information) Yes - validated most recent copy scanned in chart (See row information) Yes - validated most recent copy scanned in chart (See row information) Yes - validated most recent copy scanned in chart (See row information) Yes - validated most recent copy scanned in chart (See row information) Yes - validated most recent copy scanned in chart (See row information) Yes - validated most recent copy scanned in chart (See row information)  Would patient like information on creating a medical advance directive?   No - Patient declined No - Patient declined       Current Medications (verified) Outpatient Encounter Medications as of 02/28/2022  Medication Sig   acetaminophen (TYLENOL)  325 MG tablet Take 650 mg by mouth in the morning and at bedtime.   acetaminophen (TYLENOL) 500 MG tablet Take 1,000 mg by mouth every 8 (eight) hours as needed for moderate pain, mild pain, headache or fever.   ASPIRIN 81 PO Take 1 tablet by mouth daily.   Calcium Carbonate (CALCIUM-CARB 600 PO) Take 1 tablet by mouth 2 (two) times daily.   Camphor-Menthol-Methyl Sal 3.02-13-08 % PTCH Apply 1 patch topically daily as needed.   cetirizine (ZYRTEC ALLERGY) 10 MG tablet Take 1 tablet (10 mg total) by mouth daily.   Cholecalciferol (VITAMIN D3) 2000 units capsule Take 1 capsule (2,000 Units total) by mouth daily.   Cobalamin Combinations (VITAMIN B12-FOLIC ACID PO) Place 1 lozenge under the tongue in the morning.   divalproex (DEPAKOTE SPRINKLE) 125 MG capsule Take 125 mg by mouth 2 (two) times daily.   docusate sodium (COLACE) 100 MG capsule Take 100 mg by mouth at bedtime.   FLUoxetine (PROZAC) 20 MG capsule Take 1 capsule (20 mg total) by mouth at bedtime.   gabapentin (NEURONTIN) 100 MG capsule Take 1 capsule (100 mg total) by mouth 3 (three) times daily.   KETOCONAZOLE, TOPICAL, (NIZORAL A-D) 1 % SHAM Apply 1 application. topically once a week. Monday   Liniments (BLUE-EMU SUPER STRENGTH) CREA Apply 4 oz topically. Apply thin layer to the back.   melatonin 5 MG TABS Take 5 mg by  mouth at bedtime.   Multiple Vitamins-Minerals (PRESERVISION AREDS 2 PO) Take 1 capsule by mouth 2 (two) times daily.   OLANZapine (ZYPREXA) 7.5 MG tablet Take 7.5 mg by mouth at bedtime.   polyethylene glycol (MIRALAX / GLYCOLAX) 17 g packet Take 17 g by mouth daily.   senna-docusate (SENOKOT-S) 8.6-50 MG tablet Take 2 tablets by mouth daily.   sodium phosphate (FLEET) 7-19 GM/118ML ENEM Place 1 enema rectally daily as needed for severe constipation or mild constipation.   No facility-administered encounter medications on file as of 02/28/2022.    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   Depression    years ago   Headache    sinus headaches   Lipoma of arm    Right   Medical history non-contributory    Osteoporosis    SVD (spontaneous vaginal delivery)    x 2   Varicose vein    Vitamin D deficiency    Past Surgical History:  Procedure Laterality Date   ANTERIOR APPROACH HEMI HIP ARTHROPLASTY Right 09/04/2020   Procedure: ANTERIOR APPROACH HEMI HIP ARTHROPLASTY;  Surgeon: Rod Can, MD;  Location: [facility];  Service: Orthopedics;  Laterality: Right;   [place]   COLONOSCOPY     GANGLION CYST EXCISION     x2 left and right arm   HYSTEROSCOPY WITH D & C N/A 07/15/2013   Procedure: DILATATION AND CURETTAGE /HYSTEROSCOPY;  Surgeon: Lovenia Kim, MD;  Location: [facility];  Service: Gynecology;  Laterality: N/A;   KYPHOPLASTY Bilateral 05/04/2020   Procedure: KYPHOPLASTY THORACIC TWELVE;  Surgeon: Vallarie Mare, MD;  Location: [facility];  Service: Neurosurgery;  Laterality: Bilateral;   MANDIBLE SURGERY  1954   WISDOM TOOTH EXTRACTION     Family History  Problem Relation Age of Onset   Hypertension Mother    Heart disease Father    Cancer Father    Social History   Socioeconomic History   Marital status: Married    Spouse name: Not on file   Number of children: 1   Years of education: Not on file   Highest education level: Not on file  Occupational History   Occupation: retired  Tobacco Use   Smoking status: Never   Smokeless tobacco: Never  Vaping Use   Vaping Use: Never used  Substance and Sexual Activity   Alcohol use: No   Drug use: No   Sexual activity: Yes    Birth control/protection: Post-menopausal  Other Topics Concern   Not on file  Social History Narrative   GI in [place] for colonoscopy    Dr Ronita Hipps - GYN      Denies Surgical history      Family history of varicose veins   Mother is 28      Regular exercise - NO   Social Determinants of Health   Financial Resource Strain: Low Risk  (10/10/2019)   Overall Financial Resource Strain (CARDIA)    Difficulty of Paying Living Expenses: Not hard at all  Food Insecurity: No Food Insecurity (10/10/2019)   Hunger Vital Sign    Worried About Running Out of Food in the Last Year: Never true    [place] in the Last Year: Never  true  Transportation Needs: No Transportation Needs (10/10/2019)   PRAPARE - Hydrologist (Medical): No    Lack of Transportation (Non-Medical): No  Physical Activity: Sufficiently Active (10/10/2019)   Exercise Vital Sign    Days of Exercise per Week: 5 days    Minutes of Exercise per Session: 30 min  Stress: Stress Concern Present (10/10/2019)   [place]    Feeling of Stress : Rather much  Social Connections: Moderately Integrated (10/10/2019)   Social Connection and Isolation Panel [NHANES]    Frequency of Communication with Friends and Family: More than three times a week    Frequency of Social Gatherings with Friends and Family: Never    Attends Religious Services: Never    Marine scientist or Organizations: Yes    Attends Archivist Meetings: Never    Marital Status: Married    Tobacco Counseling Counseling given: Not Answered   Clinical Intake:  Pre-visit preparation completed: Yes  Pain : No/denies pain     BMI - recorded: 14.35 Nutritional Status: BMI <19  Underweight Nutritional Risks: None Diabetes: No  How often do you need to have someone help you when you read instructions, pamphlets, or other written materials from your doctor or pharmacy?: 4 - Often What is the last grade level you completed in school?: college  Diabetic?no  Interpreter Needed?:  No  Information entered by :: Rayya Yagi Bretta Bang N{   Activities of Daily Living    04/01/2021    3:49 PM  In your present state of health, do you have any difficulty performing the following activities:  Hearing? 0  Vision? 0  Difficulty concentrating or making decisions? 1  Walking or climbing stairs? 1  Dressing or bathing? 1  Doing errands, shopping? 1  Preparing Food and eating ? N  Using the Toilet? N  In the past six months, have you accidently leaked urine? N  Do you have problems with loss of bowel control? N  Managing your Medications? Y  Managing your Finances? Y  Housekeeping or managing your Housekeeping? Y    Patient Care Team: Bralee Feldt X, NP as PCP - General (Internal Medicine)  Indicate any recent Medical Services you may have received from other than [facility] providers in the past year (date may be approximate).     Assessment:   This is a routine wellness examination for Samanth.  Hearing/Vision screen No results found.  Dietary issues and exercise activities discussed:     Goals Addressed             This Visit's Progress    Maintain Mobility and Function       Evidence-based guidance:  Acknowledge and validate impact of pain, loss of strength and potential disfigurement (hand osteoarthritis) on mental health and daily life, such as social isolation, anxiety, depression, impaired sexual relationship and   injury from falls.  Anticipate referral to physical or occupational therapy for assessment, therapeutic exercise and recommendation for adaptive equipment or assistive devices; encourage participation.  Assess impact on ability to perform activities of daily living, as well as engage in sports and leisure events or requirements of work or school.  Provide anticipatory guidance and reassurance about the benefit of exercise to maintain function; acknowledge and normalize fear that exercise may worsen symptoms.  Encourage regular exercise, at least 10  minutes at a time for 45 minutes per week; consider yoga, water exercise  and proprioceptive exercises; encourage use of wearable activity tracker to increase motivation and adherence.  Encourage maintenance or resumption of daily activities, including employment, as pain allows and with minimal exposure to trauma.  Assist patient to advocate for adaptations to the work environment.  Consider level of pain and function, gender, age, lifestyle, patient preference, quality of life, readiness and ?ocapacity to benefit? when recommending patients for orthopaedic surgery consultation.  Explore strategies, such as changes to medication regimen or activity that enables patient to anticipate and manage flare-ups that increase deconditioning and disability.  Explore patient preferences; encourage exposure to a broader range of activities that have been avoided for fear of experiencing pain.  Identify barriers to participation in therapy or exercise, such as pain with activity, anticipated or imagined pain.  Monitor postoperative joint replacement or any preexisting joint replacement for ongoing pain and loss of function; provide social support and encouragement throughout recovery.   Notes:        Depression Screen    06/30/2021    3:06 PM 10/10/2019    3:21 PM 10/08/2018   10:32 AM 10/02/2017   10:17 AM 09/29/2016   11:46 AM 07/17/2014   11:06 AM  PHQ 2/9 Scores  PHQ - 2 Score 0 '1 1 1 '$ 0 0  PHQ- 9 Score 0  '2 2 1     '$ Fall Risk    01/18/2022   10:20 AM 11/09/2021   11:37 AM 06/30/2021    3:06 PM 10/10/2019    3:27 PM 10/08/2018   10:32 AM  Fall Risk   Falls in the past year? 0 0 0 0 0  Number falls in past yr: 0 0 0 0 0  Injury with Fall? 0 0 0 0 0  Risk for fall due to : No Fall Risks No Fall Risks Impaired mobility;Impaired balance/gait;History of fall(s) No Fall Risks   Follow up Falls evaluation completed Falls evaluation completed  Falls evaluation completed     [place]:  Any stairs in or around the home? Yes  If so, are there any without handrails? No  Home free of loose throw rugs in walkways, pet beds, electrical cords, etc? Yes  Adequate lighting in your home to reduce risk of falls? Yes   ASSISTIVE DEVICES UTILIZED TO PREVENT FALLS:  Life alert? No  Use of a cane, walker or w/c? Yes  Grab bars in the bathroom? Yes  Shower chair or bench in shower? Yes  Elevated toilet seat or a handicapped toilet? Yes   TIMED UP AND GO:  Was the test performed? Yes .  Length of time to ambulate 10 feet: 5 sec.   Gait steady and fast with assistive device  Cognitive Function:    04/13/2021   11:04 AM 10/02/2017   10:16 AM  MMSE - Mini Mental State Exam  Not completed:  Refused  Orientation to time 3   Orientation to Place 5   Registration 3   Attention/ Calculation 5   Recall 3   Language- name 2 objects 2   Language- repeat 1   Language- follow 3 step command 3   Language- read & follow direction 1   Write a sentence 1   Copy design 0   Total score 27         10/10/2019    3:29 PM 10/08/2018   10:43 AM  6CIT Screen  What Year? 0 points 0 points  What month? 0 points 0  points  What time? 3 points 0 points  Count back from 20 0 points 0 points  Months in reverse 0 points 0 points  Repeat phrase 0 points 0 points  Total Score 3 points 0 points    Immunizations Immunization History  Administered Date(s) Administered   DT (Pediatric) 04/10/2002   Fluad Quad(high Dose 65+) 10/28/2019   Influenza Split 03/08/2011, 11/17/2011   Influenza Whole 01/09/2007, 11/05/2008, 11/04/2009   Influenza, High Dose Seasonal PF 12/07/2015, 09/29/2016, 11/09/2017, 09/27/2018, 10/28/2019   Influenza,trivalent, recombinat, inj, PF 03/08/2011, 11/17/2011   Influenza-Unspecified 10/30/2012, 11/29/2013, 12/02/2014, 11/25/2020   Moderna Covid-19 Vaccine Bivalent Booster 64yr & up 12/09/2021   Moderna Sars-Covid-2 Vaccination 02/08/2019,  03/11/2019   PFIZER(Purple Top)SARS-COV-2 Vaccination 10/09/2019   PPD Test 12/22/2015, 01/05/2016   Pneumococcal Conjugate-13 07/17/2014   Pneumococcal Polysaccharide-23 08/15/2005, 09/25/2015   Tetanus 12/24/2012   Unspecified SARS-COV-2 Vaccination 02/11/2019, 12/17/2019, 10/27/2020   Zoster Recombinat (Shingrix) 06/07/2019, 09/06/2019   Zoster, Live 12/05/2013    TDAP status: Up to date  Flu Vaccine status: Up to date  Pneumococcal vaccine status: Up to date  Covid-19 vaccine status: Information provided on how to obtain vaccines.   Qualifies for Shingles Vaccine? Yes   Zostavax completed Yes   Shingrix Completed?: Yes  Screening Tests Health Maintenance  Topic Date Due   INFLUENZA VACCINE  09/07/2021   COVID-19 Vaccine (8 - 2023-24 season) 02/03/2022   DTaP/Tdap/Td (3 - Tdap) 12/25/2022   Medicare Annual Wellness (AWV)  03/01/2023   Pneumonia Vaccine 82 Years old  Completed   DEXA SCAN  Completed   Zoster Vaccines- Shingrix  Completed   HPV VACCINES  Aged Out    Health Maintenance  Health Maintenance Due  Topic Date Due   INFLUENZA VACCINE  09/07/2021   COVID-19 Vaccine (8 - 2023-24 season) 02/03/2022    Colorectal cancer screening: No longer required.   Mammogram status: No longer required due to aged out.  Bone Density status: Ordered 02/28/22. Pt provided with contact info and advised to call to schedule appt.  Lung Cancer Screening: (Low Dose CT Chest recommended if Age 84-80years, 30 pack-year currently smoking OR have quit w/in 15years.) does not qualify.    Additional Screening:  Hepatitis C Screening: does not qualify  Vision Screening: Recommended annual ophthalmology exams for early detection of glaucoma and other disorders of the eye. Is the patient up to date with their annual eye exam?  No  Who is the provider or what is the name of the office in which the patient attends annual eye exams? [facility] will provide.  If pt is not established with  a provider, would they like to be referred to a provider to establish care? No .   Dental Screening: Recommended annual dental exams for proper oral hygiene  Community Resource Referral / Chronic Care Management: CRR required this visit?  No   CCM required this visit?  No      Plan:    Pending MMSE Due DEXA, Eye Exam.  I have personally reviewed and noted the following in the patient's chart:   Medical and social history Use of alcohol, tobacco or illicit drugs  Current medications and supplements including opioid prescriptions. Patient is not currently taking opioid prescriptions. Functional ability and status Nutritional status Physical activity Advanced directives List of other physicians Hospitalizations, surgeries, and ER visits in previous 12 months Vitals Screenings to include cognitive, depression, and falls Referrals and appointments  In addition, I have reviewed and discussed with  patient certain preventive protocols, quality metrics, and best practice recommendations. A written personalized care plan for preventive services as well as general preventive health recommendations were provided to patient.     Cheryll Keisler X Cedrik Heindl, NP   02/28/2022

## 2022-03-01 DIAGNOSIS — R41841 Cognitive communication deficit: Secondary | ICD-10-CM | POA: Diagnosis not present

## 2022-03-01 DIAGNOSIS — R2681 Unsteadiness on feet: Secondary | ICD-10-CM | POA: Diagnosis not present

## 2022-03-01 DIAGNOSIS — M6281 Muscle weakness (generalized): Secondary | ICD-10-CM | POA: Diagnosis not present

## 2022-03-01 DIAGNOSIS — N3946 Mixed incontinence: Secondary | ICD-10-CM | POA: Diagnosis not present

## 2022-03-03 DIAGNOSIS — M6281 Muscle weakness (generalized): Secondary | ICD-10-CM | POA: Diagnosis not present

## 2022-03-03 DIAGNOSIS — R2681 Unsteadiness on feet: Secondary | ICD-10-CM | POA: Diagnosis not present

## 2022-03-03 DIAGNOSIS — R41841 Cognitive communication deficit: Secondary | ICD-10-CM | POA: Diagnosis not present

## 2022-03-03 DIAGNOSIS — N3946 Mixed incontinence: Secondary | ICD-10-CM | POA: Diagnosis not present

## 2022-03-04 DIAGNOSIS — N3946 Mixed incontinence: Secondary | ICD-10-CM | POA: Diagnosis not present

## 2022-03-04 DIAGNOSIS — M6281 Muscle weakness (generalized): Secondary | ICD-10-CM | POA: Diagnosis not present

## 2022-03-04 DIAGNOSIS — R2681 Unsteadiness on feet: Secondary | ICD-10-CM | POA: Diagnosis not present

## 2022-03-04 DIAGNOSIS — R41841 Cognitive communication deficit: Secondary | ICD-10-CM | POA: Diagnosis not present

## 2022-03-07 DIAGNOSIS — N3946 Mixed incontinence: Secondary | ICD-10-CM | POA: Diagnosis not present

## 2022-03-07 DIAGNOSIS — R2681 Unsteadiness on feet: Secondary | ICD-10-CM | POA: Diagnosis not present

## 2022-03-07 DIAGNOSIS — M6281 Muscle weakness (generalized): Secondary | ICD-10-CM | POA: Diagnosis not present

## 2022-03-07 DIAGNOSIS — R41841 Cognitive communication deficit: Secondary | ICD-10-CM | POA: Diagnosis not present

## 2022-03-07 DIAGNOSIS — G4700 Insomnia, unspecified: Secondary | ICD-10-CM | POA: Diagnosis not present

## 2022-03-07 DIAGNOSIS — F332 Major depressive disorder, recurrent severe without psychotic features: Secondary | ICD-10-CM | POA: Diagnosis not present

## 2022-03-07 DIAGNOSIS — F411 Generalized anxiety disorder: Secondary | ICD-10-CM | POA: Diagnosis not present

## 2022-03-07 DIAGNOSIS — F03A3 Unspecified dementia, mild, with mood disturbance: Secondary | ICD-10-CM | POA: Diagnosis not present

## 2022-03-08 DIAGNOSIS — N3946 Mixed incontinence: Secondary | ICD-10-CM | POA: Diagnosis not present

## 2022-03-08 DIAGNOSIS — R41841 Cognitive communication deficit: Secondary | ICD-10-CM | POA: Diagnosis not present

## 2022-03-08 DIAGNOSIS — R2681 Unsteadiness on feet: Secondary | ICD-10-CM | POA: Diagnosis not present

## 2022-03-08 DIAGNOSIS — M6281 Muscle weakness (generalized): Secondary | ICD-10-CM | POA: Diagnosis not present

## 2022-03-10 DIAGNOSIS — M6281 Muscle weakness (generalized): Secondary | ICD-10-CM | POA: Diagnosis not present

## 2022-03-10 DIAGNOSIS — N3946 Mixed incontinence: Secondary | ICD-10-CM | POA: Diagnosis not present

## 2022-03-10 DIAGNOSIS — R2681 Unsteadiness on feet: Secondary | ICD-10-CM | POA: Diagnosis not present

## 2022-03-11 DIAGNOSIS — N3946 Mixed incontinence: Secondary | ICD-10-CM | POA: Diagnosis not present

## 2022-03-11 DIAGNOSIS — R2681 Unsteadiness on feet: Secondary | ICD-10-CM | POA: Diagnosis not present

## 2022-03-11 DIAGNOSIS — M6281 Muscle weakness (generalized): Secondary | ICD-10-CM | POA: Diagnosis not present

## 2022-03-15 DIAGNOSIS — M6281 Muscle weakness (generalized): Secondary | ICD-10-CM | POA: Diagnosis not present

## 2022-03-15 DIAGNOSIS — N3946 Mixed incontinence: Secondary | ICD-10-CM | POA: Diagnosis not present

## 2022-03-15 DIAGNOSIS — R2681 Unsteadiness on feet: Secondary | ICD-10-CM | POA: Diagnosis not present

## 2022-03-16 DIAGNOSIS — N3946 Mixed incontinence: Secondary | ICD-10-CM | POA: Diagnosis not present

## 2022-03-16 DIAGNOSIS — R2681 Unsteadiness on feet: Secondary | ICD-10-CM | POA: Diagnosis not present

## 2022-03-16 DIAGNOSIS — M6281 Muscle weakness (generalized): Secondary | ICD-10-CM | POA: Diagnosis not present

## 2022-03-18 DIAGNOSIS — M6281 Muscle weakness (generalized): Secondary | ICD-10-CM | POA: Diagnosis not present

## 2022-03-18 DIAGNOSIS — R2681 Unsteadiness on feet: Secondary | ICD-10-CM | POA: Diagnosis not present

## 2022-03-18 DIAGNOSIS — N3946 Mixed incontinence: Secondary | ICD-10-CM | POA: Diagnosis not present

## 2022-03-22 DIAGNOSIS — M6281 Muscle weakness (generalized): Secondary | ICD-10-CM | POA: Diagnosis not present

## 2022-03-22 DIAGNOSIS — N3946 Mixed incontinence: Secondary | ICD-10-CM | POA: Diagnosis not present

## 2022-03-22 DIAGNOSIS — R2681 Unsteadiness on feet: Secondary | ICD-10-CM | POA: Diagnosis not present

## 2022-03-23 DIAGNOSIS — N3946 Mixed incontinence: Secondary | ICD-10-CM | POA: Diagnosis not present

## 2022-03-23 DIAGNOSIS — R2681 Unsteadiness on feet: Secondary | ICD-10-CM | POA: Diagnosis not present

## 2022-03-23 DIAGNOSIS — M6281 Muscle weakness (generalized): Secondary | ICD-10-CM | POA: Diagnosis not present

## 2022-03-24 DIAGNOSIS — M6281 Muscle weakness (generalized): Secondary | ICD-10-CM | POA: Diagnosis not present

## 2022-03-24 DIAGNOSIS — N3946 Mixed incontinence: Secondary | ICD-10-CM | POA: Diagnosis not present

## 2022-03-24 DIAGNOSIS — R2681 Unsteadiness on feet: Secondary | ICD-10-CM | POA: Diagnosis not present

## 2022-03-25 DIAGNOSIS — M6281 Muscle weakness (generalized): Secondary | ICD-10-CM | POA: Diagnosis not present

## 2022-03-25 DIAGNOSIS — R2681 Unsteadiness on feet: Secondary | ICD-10-CM | POA: Diagnosis not present

## 2022-03-25 DIAGNOSIS — N3946 Mixed incontinence: Secondary | ICD-10-CM | POA: Diagnosis not present

## 2022-03-28 DIAGNOSIS — R2681 Unsteadiness on feet: Secondary | ICD-10-CM | POA: Diagnosis not present

## 2022-03-28 DIAGNOSIS — M6281 Muscle weakness (generalized): Secondary | ICD-10-CM | POA: Diagnosis not present

## 2022-03-28 DIAGNOSIS — N3946 Mixed incontinence: Secondary | ICD-10-CM | POA: Diagnosis not present

## 2022-03-30 DIAGNOSIS — N3946 Mixed incontinence: Secondary | ICD-10-CM | POA: Diagnosis not present

## 2022-03-30 DIAGNOSIS — R2681 Unsteadiness on feet: Secondary | ICD-10-CM | POA: Diagnosis not present

## 2022-03-30 DIAGNOSIS — M6281 Muscle weakness (generalized): Secondary | ICD-10-CM | POA: Diagnosis not present

## 2022-03-31 DIAGNOSIS — M6281 Muscle weakness (generalized): Secondary | ICD-10-CM | POA: Diagnosis not present

## 2022-03-31 DIAGNOSIS — R2681 Unsteadiness on feet: Secondary | ICD-10-CM | POA: Diagnosis not present

## 2022-03-31 DIAGNOSIS — N3946 Mixed incontinence: Secondary | ICD-10-CM | POA: Diagnosis not present

## 2022-04-01 DIAGNOSIS — G4700 Insomnia, unspecified: Secondary | ICD-10-CM | POA: Diagnosis not present

## 2022-04-01 DIAGNOSIS — F03A3 Unspecified dementia, mild, with mood disturbance: Secondary | ICD-10-CM | POA: Diagnosis not present

## 2022-04-01 DIAGNOSIS — N3946 Mixed incontinence: Secondary | ICD-10-CM | POA: Diagnosis not present

## 2022-04-01 DIAGNOSIS — F332 Major depressive disorder, recurrent severe without psychotic features: Secondary | ICD-10-CM | POA: Diagnosis not present

## 2022-04-01 DIAGNOSIS — F411 Generalized anxiety disorder: Secondary | ICD-10-CM | POA: Diagnosis not present

## 2022-04-01 DIAGNOSIS — M6281 Muscle weakness (generalized): Secondary | ICD-10-CM | POA: Diagnosis not present

## 2022-04-01 DIAGNOSIS — R2681 Unsteadiness on feet: Secondary | ICD-10-CM | POA: Diagnosis not present

## 2022-04-04 DIAGNOSIS — M6281 Muscle weakness (generalized): Secondary | ICD-10-CM | POA: Diagnosis not present

## 2022-04-04 DIAGNOSIS — R2681 Unsteadiness on feet: Secondary | ICD-10-CM | POA: Diagnosis not present

## 2022-04-04 DIAGNOSIS — N3946 Mixed incontinence: Secondary | ICD-10-CM | POA: Diagnosis not present

## 2022-04-05 ENCOUNTER — Non-Acute Institutional Stay (SKILLED_NURSING_FACILITY): Payer: Medicare Other | Admitting: Family Medicine

## 2022-04-05 DIAGNOSIS — F0280 Dementia in other diseases classified elsewhere without behavioral disturbance: Secondary | ICD-10-CM | POA: Diagnosis not present

## 2022-04-05 DIAGNOSIS — F4323 Adjustment disorder with mixed anxiety and depressed mood: Secondary | ICD-10-CM | POA: Diagnosis not present

## 2022-04-05 DIAGNOSIS — F32A Depression, unspecified: Secondary | ICD-10-CM

## 2022-04-05 DIAGNOSIS — G309 Alzheimer's disease, unspecified: Secondary | ICD-10-CM | POA: Diagnosis not present

## 2022-04-05 DIAGNOSIS — R443 Hallucinations, unspecified: Secondary | ICD-10-CM

## 2022-04-05 DIAGNOSIS — M810 Age-related osteoporosis without current pathological fracture: Secondary | ICD-10-CM

## 2022-04-05 DIAGNOSIS — R45851 Suicidal ideations: Secondary | ICD-10-CM | POA: Diagnosis not present

## 2022-04-05 DIAGNOSIS — R627 Adult failure to thrive: Secondary | ICD-10-CM

## 2022-04-05 DIAGNOSIS — I10 Essential (primary) hypertension: Secondary | ICD-10-CM | POA: Diagnosis not present

## 2022-04-05 NOTE — Progress Notes (Signed)
Provider:  Alain Honey, MD Location:      Place of Service:     PCP: Mast, Man X, NP Patient Care Team: Mast, Man X, NP as PCP - General (Internal Medicine)  Extended Emergency Contact Information Primary Emergency Contact: Emslee, Collens Address: [address]          [place], [place] [address] [place] Phone: [phone] Relation: Spouse Secondary Emergency Contact: [facility] Phone: [phone] Relation: Daughter  Code Status:  Goals of Care: Advanced Directive information    02/28/2022   12:50 PM  Advanced Directives  Does Patient Have a Medical Advance Directive? Yes  Type of Paramedic of [place];Living will  Does patient want to make changes to medical advance directive? No - Patient declined  Copy of [place] in Chart? Yes - validated most recent copy scanned in chart (See row information)      No chief complaint on file.   HPI: Patient is a 82 y.o. female seen today for medical management of chronic problems including Alzheimer's dementia, with MMSE of 27 over 31-year ago, depression, chronic back pain, and osteoporosis.  Both patient and husband live on the same hall here at [facility].  She ambulates the halls often with her walker. As I encountered her in her room today she was filling out her menu for next week and writing and preferences.  She is on no medicines for dementia but received supportive care here at the facility. She has expressed suicidal ideations in the past and is at times anxious.  Currently takes Zyprexa, Depakote, and Prozac.  She does not speak of suicidal thoughts today.  She endorses sleeping okay.  Appetite is okay her weight has fluctuated from 87-83 over the past 10 months.  She does have osteoporosis likely due in large part to her frail stature.  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   Depression    years ago   Headache    sinus headaches   Lipoma of arm    Right   Medical history non-contributory    Osteoporosis    SVD (spontaneous vaginal delivery)    x 2   Varicose vein    Vitamin D deficiency    Past Surgical History:  Procedure Laterality Date   ANTERIOR APPROACH HEMI HIP ARTHROPLASTY Right 09/04/2020   Procedure: ANTERIOR APPROACH HEMI HIP ARTHROPLASTY;  Surgeon: Rod Can, MD;  Location: [facility];  Service: Orthopedics;  Laterality: Right;   [place]   COLONOSCOPY     GANGLION CYST EXCISION     x2 left and right arm   HYSTEROSCOPY WITH D & C N/A 07/15/2013   Procedure: DILATATION AND CURETTAGE /HYSTEROSCOPY;  Surgeon: Lovenia Kim, MD;  Location: [facility];  Service: Gynecology;  Laterality: N/A;   KYPHOPLASTY Bilateral 05/04/2020   Procedure: KYPHOPLASTY THORACIC TWELVE;  Surgeon: Vallarie Mare, MD;  Location: [facility];  Service: Neurosurgery;  Laterality: Bilateral;   MANDIBLE SURGERY  1954   WISDOM TOOTH EXTRACTION      reports that she has never smoked. She has never used smokeless tobacco. She reports that she does not drink alcohol and does not use drugs. Social History   Socioeconomic History   Marital status: Married    Spouse  name: Not on file   Number of children: 1   Years of education: Not on file   Highest education level: Not on file  Occupational History   Occupation: retired  Tobacco Use   Smoking status: Never   Smokeless tobacco: Never  Vaping Use   Vaping Use: Never used  Substance and Sexual Activity   Alcohol use: No   Drug use: No   Sexual activity: Yes    Birth control/protection: Post-menopausal  Other Topics Concern   Not on file  Social History Narrative   GI in [place] for colonoscopy   Dr Ronita Hipps - GYN      Denies Surgical history      Family history of varicose veins   Mother is 54      Regular exercise - NO   Social Determinants of Health    Financial Resource Strain: Low Risk  (10/10/2019)   Overall Financial Resource Strain (CARDIA)    Difficulty of Paying Living Expenses: Not hard at all  Food Insecurity: No Food Insecurity (10/10/2019)   Hunger Vital Sign    Worried About Running Out of Food in the Last Year: Never true    [place] in the Last Year: Never true  Transportation Needs: No Transportation Needs (10/10/2019)   PRAPARE - Hydrologist (Medical): No    Lack of Transportation (Non-Medical): No  Physical Activity: Sufficiently Active (10/10/2019)   Exercise Vital Sign    Days of Exercise per Week: 5 days    Minutes of Exercise per Session: 30 min  Stress: Stress Concern Present (10/10/2019)   [place]    Feeling of Stress : Rather much  Social Connections: Moderately Integrated (10/10/2019)   Social Connection and Isolation Panel [NHANES]    Frequency of Communication with Friends and Family: More than three times a week    Frequency of Social Gatherings with Friends and Family: Never    Attends Religious Services: Never    Marine scientist or Organizations: Yes    Attends Archivist Meetings: Never    Marital Status: Married  Human resources officer Violence: Not At Risk (10/10/2019)   Humiliation, Afraid, Rape, and Kick questionnaire    Fear of Current or Ex-Partner: No    Emotionally Abused: No    Physically Abused: No    Sexually Abused: No    Functional Status Survey:    Family History  Problem Relation Age of Onset   Hypertension Mother    Heart disease Father    Cancer Father     Health Maintenance  Topic Date Due   COVID-19 Vaccine (8 - 2023-24 season) 02/03/2022   INFLUENZA VACCINE  05/08/2022 (Originally 09/07/2021)   DTaP/Tdap/Td (3 - Tdap) 12/25/2022   Medicare Annual Wellness (AWV)  03/01/2023   Pneumonia Vaccine 62+ Years old  Completed   DEXA SCAN  Completed   Zoster Vaccines-  Shingrix  Completed   HPV VACCINES  Aged Out    Allergies  Allergen Reactions   Actonel [Risedronate] Other (See Comments)    "Allergic," per MAR   Alendronate Sodium Other (See Comments)    Leg cramps and "Allergic," per MAR   Fosamax [Alendronate]    Remeron [Mirtazapine]    Risedronate Sodium Other (See Comments)    Caused the patient to be achy and is "Allergic," per MAR   Tramadol Other (See Comments)    Caused the patient to  feel badly    Keflex [Cephalexin] Other (See Comments)    Possible rash    Outpatient Encounter Medications as of 04/05/2022  Medication Sig   acetaminophen (TYLENOL) 325 MG tablet Take 650 mg by mouth in the morning and at bedtime.   acetaminophen (TYLENOL) 500 MG tablet Take 1,000 mg by mouth every 8 (eight) hours as needed for moderate pain, mild pain, headache or fever.   ASPIRIN 81 PO Take 1 tablet by mouth daily.   Calcium Carbonate (CALCIUM-CARB 600 PO) Take 1 tablet by mouth 2 (two) times daily.   Camphor-Menthol-Methyl Sal 3.02-13-08 % PTCH Apply 1 patch topically daily as needed.   cetirizine (ZYRTEC ALLERGY) 10 MG tablet Take 1 tablet (10 mg total) by mouth daily.   Cholecalciferol (VITAMIN D3) 2000 units capsule Take 1 capsule (2,000 Units total) by mouth daily.   Cobalamin Combinations (VITAMIN B12-FOLIC ACID PO) Place 1 lozenge under the tongue in the morning.   divalproex (DEPAKOTE SPRINKLE) 125 MG capsule Take 125 mg by mouth 2 (two) times daily.   docusate sodium (COLACE) 100 MG capsule Take 100 mg by mouth at bedtime.   FLUoxetine (PROZAC) 20 MG capsule Take 1 capsule (20 mg total) by mouth at bedtime.   gabapentin (NEURONTIN) 100 MG capsule Take 1 capsule (100 mg total) by mouth 3 (three) times daily.   KETOCONAZOLE, TOPICAL, (NIZORAL A-D) 1 % SHAM Apply 1 application. topically once a week. Monday   Liniments (BLUE-EMU SUPER STRENGTH) CREA Apply 4 oz topically. Apply thin layer to the back.   melatonin 5 MG TABS Take 5 mg by mouth at  bedtime.   Multiple Vitamins-Minerals (PRESERVISION AREDS 2 PO) Take 1 capsule by mouth 2 (two) times daily.   OLANZapine (ZYPREXA) 7.5 MG tablet Take 7.5 mg by mouth at bedtime.   polyethylene glycol (MIRALAX / GLYCOLAX) 17 g packet Take 17 g by mouth daily.   senna-docusate (SENOKOT-S) 8.6-50 MG tablet Take 2 tablets by mouth daily.   sodium phosphate (FLEET) 7-19 GM/118ML ENEM Place 1 enema rectally daily as needed for severe constipation or mild constipation.   No facility-administered encounter medications on file as of 04/05/2022.    Review of Systems  Constitutional: Negative.   Respiratory: Negative.    Cardiovascular: Negative.   Gastrointestinal: Negative.   Musculoskeletal:  Positive for back pain.  Psychiatric/Behavioral:  Positive for confusion.   All other systems reviewed and are negative.   There were no vitals filed for this visit. There is no height or weight on file to calculate BMI. Physical Exam Vitals and nursing note reviewed.  Constitutional:      Appearance: Normal appearance.  HENT:     Head: Normocephalic.     Mouth/Throat:     Mouth: Mucous membranes are moist.     Pharynx: Oropharynx is clear.  Eyes:     Extraocular Movements: Extraocular movements intact.     Conjunctiva/sclera: Conjunctivae normal.  Cardiovascular:     Rate and Rhythm: Normal rate and regular rhythm.  Pulmonary:     Effort: Pulmonary effort is normal.     Breath sounds: Normal breath sounds.  Abdominal:     General: Bowel sounds are normal.     Palpations: Abdomen is soft.  Musculoskeletal:     Cervical back: Normal range of motion.     Comments: Ambulates with walker  Neurological:     General: No focal deficit present.     Mental Status: She is alert.     Comments:  Oriented to name and place  Psychiatric:        Mood and Affect: Mood normal.        Behavior: Behavior normal.     Labs reviewed: Basic Metabolic Panel: Recent Labs    06/01/21 0000 06/30/21 1605  07/22/21 0000  NA 140 137 138  K 4.2 4.4 3.9  CL 101 98 101  CO2 30* 31 32*  GLUCOSE  --  91  --   BUN '17 23 17  '$ CREATININE 0.6 0.68 0.7  CALCIUM 9.2 10.1 8.8   Liver Function Tests: Recent Labs    06/01/21 0000 06/30/21 1605 07/22/21 0000  AST '23 25 21  '$ ALT '17 20 16  '$ ALKPHOS 67 62  --   BILITOT  --  0.4  --   PROT  --  7.6  --   ALBUMIN 4.0 4.3 3.7   No results for input(s): "LIPASE", "AMYLASE" in the last 8760 hours. No results for input(s): "AMMONIA" in the last 8760 hours. CBC: Recent Labs    06/01/21 0000 06/30/21 1605 07/22/21 0000  WBC 5.5 6.5 4.6  NEUTROABS 3,031.00 4.1  --   HGB 13.8 13.3 12.5  HCT 43 40.8 38  MCV  --  92.8  --   PLT 180 199.0 206   Cardiac Enzymes: No results for input(s): "CKTOTAL", "CKMB", "CKMBINDEX", "TROPONINI" in the last 8760 hours. BNP: Invalid input(s): "POCBNP" Lab Results  Component Value Date   HGBA1C 5.5 01/06/2021   Lab Results  Component Value Date   TSH 2.25 06/30/2021   Lab Results  Component Value Date   VITAMINB12 1,892 12/10/2021   No results found for: "FOLATE" Lab Results  Component Value Date   IRON 58 06/30/2021   TIBC 313 06/30/2021   FERRITIN 72 06/30/2021    Imaging and Procedures obtained prior to SNF admission: No results found.  Assessment/Plan 1. Adjustment disorder with mixed anxiety and depressed mood Patient needs all her CNS type meds by history.  We are frequently invited to decrease doses but have not felt that is a appropriate or in her best interest  2. Alzheimer disease ([place]) Relative to many of her peers her dementia is mild.  Would expect some aggressive decline over time if this is truly Alzheimer's versus vascular dementia  3. Depression with suicidal ideation Continue with Prozac Depakote and Zyprexa  4. Failure to thrive in adult Appetite is fair and weight has fluctuated only 4 pounds over the past two thirds of year  5. 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  Feel the antipsychotic and antidepressants are indicated  6. Hypertension, unspecified type Blood pressure today is 115/40, antihypertensive  7. Age-related osteoporosis without current pathological fracture The on mineral supplements.  Has declined any other medicines    Family/ staff Communication:   Labs/tests ordered:  Lillette Boxer. [place], [place] [address] [place], [place] Office [phone]

## 2022-04-06 DIAGNOSIS — R2681 Unsteadiness on feet: Secondary | ICD-10-CM | POA: Diagnosis not present

## 2022-04-06 DIAGNOSIS — N3946 Mixed incontinence: Secondary | ICD-10-CM | POA: Diagnosis not present

## 2022-04-06 DIAGNOSIS — M6281 Muscle weakness (generalized): Secondary | ICD-10-CM | POA: Diagnosis not present

## 2022-04-07 DIAGNOSIS — R2681 Unsteadiness on feet: Secondary | ICD-10-CM | POA: Diagnosis not present

## 2022-04-07 DIAGNOSIS — N3946 Mixed incontinence: Secondary | ICD-10-CM | POA: Diagnosis not present

## 2022-04-07 DIAGNOSIS — M6281 Muscle weakness (generalized): Secondary | ICD-10-CM | POA: Diagnosis not present

## 2022-04-09 DIAGNOSIS — M6281 Muscle weakness (generalized): Secondary | ICD-10-CM | POA: Diagnosis not present

## 2022-04-09 DIAGNOSIS — N3946 Mixed incontinence: Secondary | ICD-10-CM | POA: Diagnosis not present

## 2022-04-11 DIAGNOSIS — N3946 Mixed incontinence: Secondary | ICD-10-CM | POA: Diagnosis not present

## 2022-04-11 DIAGNOSIS — M6281 Muscle weakness (generalized): Secondary | ICD-10-CM | POA: Diagnosis not present

## 2022-04-12 DIAGNOSIS — N3946 Mixed incontinence: Secondary | ICD-10-CM | POA: Diagnosis not present

## 2022-04-12 DIAGNOSIS — M6281 Muscle weakness (generalized): Secondary | ICD-10-CM | POA: Diagnosis not present

## 2022-04-13 DIAGNOSIS — M6281 Muscle weakness (generalized): Secondary | ICD-10-CM | POA: Diagnosis not present

## 2022-04-13 DIAGNOSIS — N3946 Mixed incontinence: Secondary | ICD-10-CM | POA: Diagnosis not present

## 2022-04-14 DIAGNOSIS — N3946 Mixed incontinence: Secondary | ICD-10-CM | POA: Diagnosis not present

## 2022-04-14 DIAGNOSIS — M6281 Muscle weakness (generalized): Secondary | ICD-10-CM | POA: Diagnosis not present

## 2022-04-18 DIAGNOSIS — M6281 Muscle weakness (generalized): Secondary | ICD-10-CM | POA: Diagnosis not present

## 2022-04-18 DIAGNOSIS — N3946 Mixed incontinence: Secondary | ICD-10-CM | POA: Diagnosis not present

## 2022-04-19 ENCOUNTER — Other Ambulatory Visit: Payer: Self-pay | Admitting: Nurse Practitioner

## 2022-04-19 DIAGNOSIS — L821 Other seborrheic keratosis: Secondary | ICD-10-CM | POA: Diagnosis not present

## 2022-04-19 DIAGNOSIS — M6281 Muscle weakness (generalized): Secondary | ICD-10-CM | POA: Diagnosis not present

## 2022-04-19 DIAGNOSIS — L570 Actinic keratosis: Secondary | ICD-10-CM | POA: Diagnosis not present

## 2022-04-19 DIAGNOSIS — N3946 Mixed incontinence: Secondary | ICD-10-CM | POA: Diagnosis not present

## 2022-04-20 DIAGNOSIS — N3946 Mixed incontinence: Secondary | ICD-10-CM | POA: Diagnosis not present

## 2022-04-20 DIAGNOSIS — M6281 Muscle weakness (generalized): Secondary | ICD-10-CM | POA: Diagnosis not present

## 2022-04-21 DIAGNOSIS — N3946 Mixed incontinence: Secondary | ICD-10-CM | POA: Diagnosis not present

## 2022-04-21 DIAGNOSIS — M6281 Muscle weakness (generalized): Secondary | ICD-10-CM | POA: Diagnosis not present

## 2022-04-26 DIAGNOSIS — N3946 Mixed incontinence: Secondary | ICD-10-CM | POA: Diagnosis not present

## 2022-04-26 DIAGNOSIS — M6281 Muscle weakness (generalized): Secondary | ICD-10-CM | POA: Diagnosis not present

## 2022-04-29 ENCOUNTER — Non-Acute Institutional Stay (SKILLED_NURSING_FACILITY): Payer: Medicare Other | Admitting: Nurse Practitioner

## 2022-04-29 ENCOUNTER — Encounter: Payer: Self-pay | Admitting: Nurse Practitioner

## 2022-04-29 DIAGNOSIS — I10 Essential (primary) hypertension: Secondary | ICD-10-CM

## 2022-04-29 DIAGNOSIS — R45851 Suicidal ideations: Secondary | ICD-10-CM

## 2022-04-29 DIAGNOSIS — M810 Age-related osteoporosis without current pathological fracture: Secondary | ICD-10-CM

## 2022-04-29 DIAGNOSIS — M545 Low back pain, unspecified: Secondary | ICD-10-CM | POA: Diagnosis not present

## 2022-04-29 DIAGNOSIS — R627 Adult failure to thrive: Secondary | ICD-10-CM

## 2022-04-29 DIAGNOSIS — K5901 Slow transit constipation: Secondary | ICD-10-CM

## 2022-04-29 DIAGNOSIS — F0390 Unspecified dementia without behavioral disturbance: Secondary | ICD-10-CM | POA: Diagnosis not present

## 2022-04-29 DIAGNOSIS — F32A Depression, unspecified: Secondary | ICD-10-CM

## 2022-04-29 NOTE — Assessment & Plan Note (Signed)
Blood pressure is controlled, off Amlodipine. Bun/creat 17/0.7 07/22/21 

## 2022-04-29 NOTE — Assessment & Plan Note (Signed)
Stable,  takes Senokot S, MiraLax. 

## 2022-04-29 NOTE — Assessment & Plan Note (Signed)
hx of suicidal attempt/ideations, anxious at times,  on Prozac, Zyprexa, Depakote, Mirtazapine made her AMS 

## 2022-04-29 NOTE — Assessment & Plan Note (Signed)
TSH 2.25, Vit B12 >1504 06/30/21,  SNF [facility] for supportive care. MMSE 27/30 04/08/21 

## 2022-04-29 NOTE — Assessment & Plan Note (Signed)
Weight loss/adult failure to thrive, stable, low body weight,  didn't tolerate Mirtazapine.

## 2022-04-29 NOTE — Progress Notes (Signed)
Location:   [facility] Room Number: 30-A Place of Service:  SNF (31) Provider:  Demetris Capell, NP    Patient Care Team: Angely Dietz X, NP as PCP - General (Internal Medicine)  Extended Emergency Contact Information Primary Emergency Contact: Deshonna, Marcano Address: [address]          [place], [place] [address] [place] Phone: [phone] Relation: Spouse Secondary Emergency Contact: [facility] Phone: [phone] Relation: Daughter  Code Status:  FULL CODE Goals of care: Advanced Directive information    04/29/2022    9:05 AM  Advanced Directives  Does Patient Have a Medical Advance Directive? Yes  Type of Paramedic of [place];Living will  Does patient want to make changes to medical advance directive? No - Patient declined  Copy of [place] in Chart? Yes - validated most recent copy scanned in chart (See row information)     Chief Complaint  Patient presents with   Medical Management of Chronic Issues    Routine Visit.    Immunizations    Discuss the need for [employer].    HPI:  Pt is a 82 y.o. female seen today for medical management of chronic diseases.     OP, never received Evenity, taking Vit D, Ca. DEXA 10/06/21 T score -1.935,  may consider Prolia.              Anemia, Iron 58, Hgb 13.3 06/30/21             HTN, off Amlodipine. Bun/creat 17/0.7 07/22/21             Constipation, takes Senokot S, MiraLax.              Dementia, TSH 2.25, Vit B12 >1504 06/30/21,  SNF [facility] for supportive care. MMSE 27/30 04/08/21             Vit B12 deficiency, Vit B12 level >1504 06/30/21, takes Vit B12             Vitamin D deficiency, takes Vit d daily, Vit D 56.33 06/30/21             Allergic rhinitis, takes Zyrtec.             Hyperlipidemia, diet, LDL 117 01/06/21             Depression, hx of suicidal attempt/ideations, anxious at times,  on Prozac, Zyprexa, Depakote,  Mirtazapine made her AMS             Chronic back pain, stable, T11, T12, L1 compression fx, s/p T12 Kyphoplasty, takes Tylenol, Gabapentin, ambulates with walker.              Weight loss/adult failure to thrive, stable, low body weight,  didn't tolerate Mirtazapine.             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   Depression    years ago   Headache    sinus headaches   Lipoma of arm    Right   Medical history non-contributory    Osteoporosis    SVD (spontaneous vaginal delivery)    x 2   Varicose vein    Vitamin D deficiency    Past Surgical History:  Procedure Laterality Date   ANTERIOR APPROACH  HEMI HIP ARTHROPLASTY Right 09/04/2020   Procedure: ANTERIOR APPROACH HEMI HIP ARTHROPLASTY;  Surgeon: Rod Can, MD;  Location: [facility];  Service: Orthopedics;  Laterality: Right;   [place]   COLONOSCOPY     GANGLION CYST EXCISION     x2 left and right arm   HYSTEROSCOPY WITH D & C N/A 07/15/2013   Procedure: DILATATION AND CURETTAGE /HYSTEROSCOPY;  Surgeon: Lovenia Kim, MD;  Location: [facility];  Service: Gynecology;  Laterality: N/A;   KYPHOPLASTY Bilateral 05/04/2020   Procedure: KYPHOPLASTY THORACIC TWELVE;  Surgeon: Vallarie Mare, MD;  Location: [facility];  Service: Neurosurgery;  Laterality: Bilateral;   MANDIBLE SURGERY  1954   WISDOM TOOTH EXTRACTION      Allergies  Allergen Reactions   Actonel [Risedronate] Other (See Comments)    "Allergic," per MAR   Alendronate Sodium Other (See Comments)    Leg cramps and "Allergic," per MAR   Bisphosphonates    Fosamax [Alendronate]    Remeron [Mirtazapine]    Risedronate Sodium Other (See Comments)    Caused the patient to be achy and is "Allergic," per MAR   Tramadol Other (See Comments)    Caused the patient to feel badly    Keflex [Cephalexin] Other (See Comments)    Possible  rash    Allergies as of 04/29/2022       Reactions   Actonel [risedronate] Other (See Comments)   "Allergic," per MAR   Alendronate Sodium Other (See Comments)   Leg cramps and "Allergic," per MAR   Bisphosphonates    Fosamax [alendronate]    Remeron [mirtazapine]    Risedronate Sodium Other (See Comments)   Caused the patient to be achy and is "Allergic," per MAR   Tramadol Other (See Comments)   Caused the patient to feel badly    Keflex [cephalexin] Other (See Comments)   Possible rash        Medication List        Accurate as of April 29, 2022 12:48 PM. If you have any questions, ask your nurse or doctor.          STOP taking these medications    Camphor-Menthol-Methyl Sal 3.02-13-08 % Ptch Stopped by: Quincy Prisco X Hlee Fringer, NP   sodium phosphate 7-19 GM/118ML Enem Stopped by: Lucca Ballo X Deicy Rusk, NP       TAKE these medications    acetaminophen 325 MG tablet Commonly known as: TYLENOL Take 650 mg by mouth in the morning and at bedtime.   ASPIRIN 81 PO Take 1 tablet by mouth daily.   Blue-Emu Super Strength Crea Apply 4 oz topically. Apply thin layer to the back.   CALCIUM-CARB 600 PO Take 1 tablet by mouth 2 (two) times daily.   cetirizine 10 MG tablet Commonly known as: ZyrTEC Allergy Take 1 tablet (10 mg total) by mouth daily.   divalproex 125 MG capsule Commonly known as: DEPAKOTE SPRINKLE Take 125 mg by mouth 2 (two) times daily.   docusate sodium 100 MG capsule Commonly known as: COLACE Take 100 mg by mouth at bedtime.   FLUoxetine 20 MG capsule Commonly known as: PROZAC Take 1 capsule (20 mg total) by mouth at bedtime.   gabapentin 100 MG capsule Commonly known as: NEURONTIN Take 1 capsule (100 mg total) by mouth 3 (three) times daily.   melatonin 5 MG Tabs Take 5 mg by mouth at bedtime.   Nizoral A-D 1 % Sham Generic drug: KETOCONAZOLE (TOPICAL)  Apply 1 application. topically once a week. Monday   OLANZapine 7.5 MG tablet Commonly known as:  ZYPREXA Take 7.5 mg by mouth at bedtime.   polyethylene glycol 17 g packet Commonly known as: MIRALAX / GLYCOLAX Take 17 g by mouth daily.   PRESERVISION AREDS 2 PO Take 1 capsule by mouth 2 (two) times daily.   senna-docusate 8.6-50 MG tablet Commonly known as: Senokot-S Take 2 tablets by mouth daily.   VITAMIN B12-FOLIC ACID PO Place 1 lozenge under the tongue in the morning.   Vitamin D3 50 MCG (2000 UT) Caps Generic drug: Cholecalciferol Take 1 capsule (2,000 Units total) by mouth daily.        Review of Systems  Constitutional:  Negative for activity change, fever and unexpected weight change.  HENT:  Positive for hearing loss. Negative for congestion and voice change.   Eyes:  Negative for visual disturbance.  Respiratory:  Negative for cough and shortness of breath.   Cardiovascular:  Negative for leg swelling.  Gastrointestinal:  Negative for abdominal pain and constipation.  Genitourinary:  Negative for dysuria and urgency.  Musculoskeletal:  Positive for arthralgias, back pain and gait problem.  Skin:  Negative for color change.  Neurological:  Negative for speech difficulty, weakness and headaches.       Dementia.   Psychiatric/Behavioral:  Positive for sleep disturbance. Negative for confusion and hallucinations. The patient is not nervous/anxious.     Immunization History  Administered Date(s) Administered   DT (Pediatric) 04/10/2002   Fluad Quad(high Dose 65+) 10/28/2019   Influenza Split 03/08/2011, 11/17/2011   Influenza Whole 01/09/2007, 11/05/2008, 11/04/2009   Influenza, High Dose Seasonal PF 12/07/2015, 09/29/2016, 11/09/2017, 09/27/2018, 10/28/2019   Influenza,trivalent, recombinat, inj, PF 03/08/2011, 11/17/2011   Influenza-Unspecified 10/30/2012, 11/29/2013, 12/02/2014, 11/25/2020   Moderna Covid-19 Vaccine Bivalent Booster 77yrs & up 12/09/2021   Moderna Sars-Covid-2 Vaccination 02/08/2019, 03/11/2019   PFIZER(Purple Top)SARS-COV-2  Vaccination 10/09/2019   PPD Test 12/22/2015, 01/05/2016   Pneumococcal Conjugate-13 07/17/2014   Pneumococcal Polysaccharide-23 08/15/2005, 09/25/2015   Tetanus 12/24/2012   Unspecified SARS-COV-2 Vaccination 02/11/2019, 12/17/2019, 10/27/2020   Zoster Recombinat (Shingrix) 06/07/2019, 09/06/2019   Zoster, Live 12/05/2013   Pertinent  Health Maintenance Due  Topic Date Due   INFLUENZA VACCINE  05/08/2022 (Originally 09/07/2021)   DEXA SCAN  Completed      01/12/2021   10:01 PM 01/13/2021    8:14 AM 06/30/2021    3:06 PM 11/09/2021   11:37 AM 01/18/2022   10:20 AM  Fall Risk  Falls in the past year?   0 0 0  Was there an injury with Fall?   0 0 0  Fall Risk Category Calculator   0 0 0  Fall Risk Category (Retired)   Low Low Low  (RETIRED) Patient Fall Risk Level   High fall risk Low fall risk Low fall risk  Patient at Risk for Falls Due to   Impaired mobility;Impaired balance/gait;History of fall(s) No Fall Risks No Fall Risks  Fall risk Follow up    Falls evaluation completed Falls evaluation completed     Information is confidential and restricted. Go to Review Flowsheets to unlock data.   Functional Status Survey:    Vitals:   04/29/22 0901  BP: 132/60  Pulse: 68  Resp: 20  Temp: (!) 97.4 F (36.3 C)  SpO2: 94%  Weight: 87 lb 3.2 oz (39.6 kg)  Height: 5\' 4"  (1.626 m)   Body mass index is 14.97 kg/m. Physical Exam Vitals and  nursing note reviewed.  Constitutional:      Appearance: Normal appearance.  HENT:     Head: Normocephalic.     Nose: Nose normal.     Mouth/Throat:     Mouth: Mucous membranes are moist.  Eyes:     General:        Right eye: No discharge.        Left eye: No discharge.  Cardiovascular:     Rate and Rhythm: Normal rate.     Heart sounds: No murmur heard. Pulmonary:     Effort: Pulmonary effort is normal.     Breath sounds: No rales.  Abdominal:     General: Bowel sounds are normal.     Palpations: Abdomen is soft.     Tenderness:  There is no abdominal tenderness.  Musculoskeletal:     Cervical back: Normal range of motion and neck supple.     Right lower leg: No edema.     Left lower leg: No edema.     Comments: Right hip s/p ORIF 09/08/20, healed.   Skin:    General: Skin is warm and dry.  Neurological:     General: No focal deficit present.     Mental Status: She is alert and oriented to person, place, and time. Mental status is at baseline.     Motor: No weakness.     Coordination: Coordination normal.     Gait: Gait abnormal.     Comments: Ambulates with walker.   Psychiatric:        Mood and Affect: Mood normal.        Behavior: Behavior normal.     Comments: Confused.      Labs reviewed: Recent Labs    06/01/21 0000 06/30/21 1605 07/22/21 0000  NA 140 137 138  K 4.2 4.4 3.9  CL 101 98 101  CO2 30* 31 32*  GLUCOSE  --  91  --   BUN 17 23 17   CREATININE 0.6 0.68 0.7  CALCIUM 9.2 10.1 8.8   Recent Labs    06/01/21 0000 06/30/21 1605 07/22/21 0000  AST 23 25 21   ALT 17 20 16   ALKPHOS 67 62  --   BILITOT  --  0.4  --   PROT  --  7.6  --   ALBUMIN 4.0 4.3 3.7   Recent Labs    06/01/21 0000 06/30/21 1605 07/22/21 0000  WBC 5.5 6.5 4.6  NEUTROABS 3,031.00 4.1  --   HGB 13.8 13.3 12.5  HCT 43 40.8 38  MCV  --  92.8  --   PLT 180 199.0 206   Lab Results  Component Value Date   TSH 2.25 06/30/2021   Lab Results  Component Value Date   HGBA1C 5.5 01/06/2021   Lab Results  Component Value Date   CHOL 182 01/06/2021   HDL 58 01/06/2021   LDLCALC 117 (H) 01/06/2021   LDLDIRECT 135.4 12/26/2008   TRIG 37 01/06/2021   CHOLHDL 3.1 01/06/2021    Significant Diagnostic Results in last 30 days:  No results found.  Assessment/Plan Osteoporosis never received Evenity, taking Vit D, Ca. DEXA 10/06/21 T score -1.935,  may consider Prolia.   HTN (hypertension) Blood pressure is controlled, off Amlodipine. Bun/creat 17/0.7 07/22/21  Slow transit constipation Stable,  takes  Senokot S, MiraLax.   Senile dementia ([place]) TSH 2.25, Vit B12 >1504 06/30/21,  SNF [facility] for supportive care. MMSE 27/30 04/08/21  Depression with suicidal ideation hx of  suicidal attempt/ideations, anxious at times,  on Prozac, Zyprexa, Depakote, Mirtazapine made her AMS  Low back pain  stable, T11, T12, L1 compression fx, s/p T12 Kyphoplasty, takes Tylenol, Gabapentin, ambulates with walker.      Family/ staff Communication: plan of care reviewed with the patient and charge nurse.   Labs/tests ordered:  none  Time spend 35 minutes.

## 2022-04-29 NOTE — Assessment & Plan Note (Signed)
stable, T11, T12, L1 compression fx, s/p T12 Kyphoplasty, takes Tylenol, Gabapentin, ambulates with walker.  

## 2022-04-29 NOTE — Assessment & Plan Note (Signed)
never received Evenity, taking Vit D, Ca. DEXA 10/06/21 T score -1.935,  may consider Prolia.

## 2022-05-03 DIAGNOSIS — N3946 Mixed incontinence: Secondary | ICD-10-CM | POA: Diagnosis not present

## 2022-05-03 DIAGNOSIS — M6281 Muscle weakness (generalized): Secondary | ICD-10-CM | POA: Diagnosis not present

## 2022-05-05 ENCOUNTER — Other Ambulatory Visit (HOSPITAL_COMMUNITY): Payer: Self-pay | Admitting: Neurosurgery

## 2022-05-05 DIAGNOSIS — S22080A Wedge compression fracture of T11-T12 vertebra, initial encounter for closed fracture: Secondary | ICD-10-CM | POA: Diagnosis not present

## 2022-05-05 DIAGNOSIS — S32010A Wedge compression fracture of first lumbar vertebra, initial encounter for closed fracture: Secondary | ICD-10-CM | POA: Diagnosis not present

## 2022-05-05 DIAGNOSIS — Z681 Body mass index (BMI) 19 or less, adult: Secondary | ICD-10-CM | POA: Diagnosis not present

## 2022-05-10 DIAGNOSIS — M6281 Muscle weakness (generalized): Secondary | ICD-10-CM | POA: Diagnosis not present

## 2022-05-10 DIAGNOSIS — N3946 Mixed incontinence: Secondary | ICD-10-CM | POA: Diagnosis not present

## 2022-05-13 ENCOUNTER — Non-Acute Institutional Stay (SKILLED_NURSING_FACILITY): Payer: Medicare Other | Admitting: Nurse Practitioner

## 2022-05-13 ENCOUNTER — Encounter: Payer: Self-pay | Admitting: Nurse Practitioner

## 2022-05-13 DIAGNOSIS — F32A Depression, unspecified: Secondary | ICD-10-CM | POA: Diagnosis not present

## 2022-05-13 DIAGNOSIS — F0390 Unspecified dementia without behavioral disturbance: Secondary | ICD-10-CM

## 2022-05-13 DIAGNOSIS — J3089 Other allergic rhinitis: Secondary | ICD-10-CM

## 2022-05-13 DIAGNOSIS — M150 Primary generalized (osteo)arthritis: Secondary | ICD-10-CM

## 2022-05-13 DIAGNOSIS — M159 Polyosteoarthritis, unspecified: Secondary | ICD-10-CM

## 2022-05-13 DIAGNOSIS — I10 Essential (primary) hypertension: Secondary | ICD-10-CM

## 2022-05-13 DIAGNOSIS — E538 Deficiency of other specified B group vitamins: Secondary | ICD-10-CM

## 2022-05-13 DIAGNOSIS — R627 Adult failure to thrive: Secondary | ICD-10-CM | POA: Diagnosis not present

## 2022-05-13 DIAGNOSIS — K5901 Slow transit constipation: Secondary | ICD-10-CM | POA: Diagnosis not present

## 2022-05-13 DIAGNOSIS — E559 Vitamin D deficiency, unspecified: Secondary | ICD-10-CM | POA: Diagnosis not present

## 2022-05-13 DIAGNOSIS — D500 Iron deficiency anemia secondary to blood loss (chronic): Secondary | ICD-10-CM | POA: Diagnosis not present

## 2022-05-13 DIAGNOSIS — E785 Hyperlipidemia, unspecified: Secondary | ICD-10-CM

## 2022-05-13 DIAGNOSIS — R45851 Suicidal ideations: Secondary | ICD-10-CM

## 2022-05-13 DIAGNOSIS — D696 Thrombocytopenia, unspecified: Secondary | ICD-10-CM

## 2022-05-13 DIAGNOSIS — M810 Age-related osteoporosis without current pathological fracture: Secondary | ICD-10-CM

## 2022-05-13 NOTE — Assessment & Plan Note (Signed)
stable, T11, T12, L1 compression fx, s/p T12 Kyphoplasty, takes Tylenol, Gabapentin, ambulates with walker.  

## 2022-05-13 NOTE — Assessment & Plan Note (Signed)
Iron 58, Hgb 13.3 06/30/21 

## 2022-05-13 NOTE — Assessment & Plan Note (Signed)
Stable, takes Zyrtec.  

## 2022-05-13 NOTE — Assessment & Plan Note (Signed)
stable, low body weight,  didn't tolerate Mirtazapine.

## 2022-05-13 NOTE — Assessment & Plan Note (Signed)
Vit B12 level >1504 06/30/21, takes Vit B12 

## 2022-05-13 NOTE — Assessment & Plan Note (Signed)
diet, LDL 117 01/06/21 

## 2022-05-13 NOTE — Assessment & Plan Note (Signed)
Blood pressure is controlled, off Amlodipine. Bun/creat 17/0.7 07/22/21 

## 2022-05-13 NOTE — Assessment & Plan Note (Signed)
never received Evenity, taking Vit D, Ca. DEXA 10/06/21 T score -1.935,  may consider Prolia.  

## 2022-05-13 NOTE — Assessment & Plan Note (Signed)
takes Vit d daily, Vit D 56.33 06/30/21 

## 2022-05-13 NOTE — Assessment & Plan Note (Signed)
plt 206 07/22/21 

## 2022-05-13 NOTE — Assessment & Plan Note (Signed)
TSH 2.25, Vit B12 >1504 06/30/21,  SNF [facility] for supportive care. MMSE 27/30 04/08/21 

## 2022-05-13 NOTE — Progress Notes (Signed)
[facility]   [facility] Room Number: 30-A Place of Service:  SNF (31) Provider: Velora Horstman X, NP    Patient Care Team: Sheliah Fiorillo X, NP as PCP - General (Internal Medicine)  Extended Emergency Contact Information Primary Emergency Contact: Tamela OddiKirk,[name] Address: [address]          [place], [place] [address] [place] Home Phone: [phone] Relation: Spouse Secondary Emergency Contact: [facility][name] Mobile Phone: [phone] Relation: Daughter  Code Status: FULL CODE  Goals of care: Advanced Directive information    05/13/2022    8:16 AM  Advanced Directives  Does Patient Have a Medical Advance Directive? Yes  Type of Estate agentAdvance Directive Healthcare Power of [place];Living will  Does patient want to make changes to medical advance directive? No - Patient declined  Copy of Healthcare Power of Attorney in Chart? Yes - validated most recent copy scanned in chart (See row information)     Chief Complaint  Patient presents with   Medical Management of Chronic Issues    Routine Visit.   Immunizations    Discuss the need for [employer] Specialty ChemicalsCovid Booster.    HPI:  Pt is a 82 y.o. female seen today for medical management of chronic diseases.     OP, never received Evenity, taking Vit D, Ca. DEXA 10/06/21 T score -1.935,  may consider Prolia.              Anemia, Iron 58, Hgb 13.3 06/30/21             HTN, off Amlodipine. Bun/creat 17/0.7 07/22/21             Constipation, takes Senokot S, MiraLax.              Dementia, TSH 2.25, Vit B12 >1504 06/30/21,  SNF [facility] for supportive care. MMSE 27/30 04/08/21             Vit B12 deficiency, Vit B12 level >1504 06/30/21, takes Vit B12             Vitamin D deficiency, takes Vit d daily, Vit D 56.33 06/30/21             Allergic rhinitis, takes Zyrtec.             Hyperlipidemia, diet, LDL 117 01/06/21             Depression, hx of suicidal attempt/ideations, anxious at times,  on Prozac, Zyprexa, Depakote.  Mirtazapine made her AMS             Chronic back pain, stable, T11, T12, L1 compression fx, s/p T12 Kyphoplasty, takes Tylenol, Gabapentin, ambulates with walker.              Weight loss/adult failure to thrive, stable, low body weight,  didn't tolerate Mirtazapine.             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   Depression    years ago   Headache    sinus headaches   Lipoma of arm    Right   Medical history non-contributory    Osteoporosis    SVD (spontaneous vaginal delivery)    x 2   Varicose vein    Vitamin D deficiency    Past Surgical History:  Procedure Laterality Date   ANTERIOR APPROACH HEMI  HIP ARTHROPLASTY Right 09/04/2020   Procedure: ANTERIOR APPROACH HEMI HIP ARTHROPLASTY;  Surgeon: Samson Frederic, MD;  Location: [facility];  Service: Orthopedics;  Laterality: Right;   APPENDECTOMY  1947   CHOLECYSTECTOMY  1988   COLONOSCOPY     GANGLION CYST EXCISION     x2 left and right arm   HYSTEROSCOPY WITH D & C N/A 07/15/2013   Procedure: DILATATION AND CURETTAGE /HYSTEROSCOPY;  Surgeon: Lenoard Aden, MD;  Location: [facility];  Service: Gynecology;  Laterality: N/A;   KYPHOPLASTY Bilateral 05/04/2020   Procedure: KYPHOPLASTY THORACIC TWELVE;  Surgeon: Bedelia Person, MD;  Location: [facility];  Service: Neurosurgery;  Laterality: Bilateral;   MANDIBLE SURGERY  1954   WISDOM TOOTH EXTRACTION      Allergies  Allergen Reactions   Actonel [Risedronate] Other (See Comments)    "Allergic," per MAR   Alendronate Sodium Other (See Comments)    Leg cramps and "Allergic," per MAR   Bisphosphonates    Fosamax [Alendronate]    Remeron [Mirtazapine]    Risedronate Sodium Other (See Comments)    Caused the patient to be achy and is "Allergic," per MAR   Tramadol Other (See Comments)    Caused the patient to feel badly    Keflex [Cephalexin] Other (See Comments)    Possible rash     Allergies as of 05/13/2022       Reactions   Actonel [risedronate] Other (See Comments)   "Allergic," per MAR   Alendronate Sodium Other (See Comments)   Leg cramps and "Allergic," per MAR   Bisphosphonates    Fosamax [alendronate]    Remeron [mirtazapine]    Risedronate Sodium Other (See Comments)   Caused the patient to be achy and is "Allergic," per MAR   Tramadol Other (See Comments)   Caused the patient to feel badly    Keflex [cephalexin] Other (See Comments)   Possible rash        Medication List        Accurate as of May 13, 2022 12:05 PM. If you have any questions, ask your nurse or doctor.          acetaminophen 325 MG tablet Commonly known as: TYLENOL Take 650 mg by mouth in the morning and at bedtime.   ASPIRIN 81 PO Take 1 tablet by mouth daily.   Blue-Emu Super Strength Crea Apply 4 oz topically. Apply thin layer to the back.   CALCIUM-CARB 600 PO Take 1 tablet by mouth 2 (two) times daily.   cetirizine 10 MG tablet Commonly known as: ZyrTEC Allergy Take 1 tablet (10 mg total) by mouth daily.   divalproex 125 MG capsule Commonly known as: DEPAKOTE SPRINKLE Take 125 mg by mouth 2 (two) times daily.   docusate sodium 100 MG capsule Commonly known as: COLACE Take 100 mg by mouth at bedtime.   FLUoxetine 20 MG capsule Commonly known as: PROZAC Take 1 capsule (20 mg total) by mouth at bedtime.   gabapentin 100 MG capsule Commonly known as: NEURONTIN Take 1 capsule (100 mg total) by mouth 3 (three) times daily.   LORazepam 0.5 MG tablet Commonly known as: ATIVAN Take 0.5 mg by mouth daily. AS NEEDED   melatonin 5 MG Tabs Take 5 mg by mouth at bedtime.   Nizoral A-D 1 % Sham Generic drug: KETOCONAZOLE (TOPICAL) Apply 1 application. topically once a week. Monday   OLANZapine 7.5 MG tablet Commonly known as: ZYPREXA Take 7.5 mg by mouth at bedtime.  polyethylene glycol 17 g packet Commonly known as: MIRALAX / GLYCOLAX Take 17  g by mouth daily.   PRESERVISION AREDS 2 PO Take 1 capsule by mouth 2 (two) times daily.   senna-docusate 8.6-50 MG tablet Commonly known as: Senokot-S Take 2 tablets by mouth daily.   VITAMIN B12-FOLIC ACID PO Place 1 lozenge under the tongue in the morning.   Vitamin D3 50 MCG (2000 UT) Caps Generic drug: Cholecalciferol Take 1 capsule (2,000 Units total) by mouth daily.        Review of Systems  Constitutional:  Negative for activity change, fever and unexpected weight change.  HENT:  Positive for hearing loss. Negative for congestion and voice change.   Eyes:  Negative for visual disturbance.  Respiratory:  Negative for cough and shortness of breath.   Cardiovascular:  Negative for leg swelling.  Gastrointestinal:  Negative for abdominal pain and constipation.  Genitourinary:  Negative for dysuria and urgency.  Musculoskeletal:  Positive for arthralgias, back pain and gait problem.  Skin:  Negative for color change.  Neurological:  Negative for speech difficulty, weakness and headaches.       Dementia.   Psychiatric/Behavioral:  Positive for sleep disturbance. Negative for confusion and hallucinations. The patient is nervous/anxious.     Immunization History  Administered Date(s) Administered   DT (Pediatric) 04/10/2002   Fluad Quad(high Dose 65+) 10/28/2019   Influenza Split 03/08/2011, 11/17/2011   Influenza Whole 01/09/2007, 11/05/2008, 11/04/2009   Influenza, High Dose Seasonal PF 12/07/2015, 09/29/2016, 11/09/2017, 09/27/2018, 10/28/2019   Influenza,trivalent, recombinat, inj, PF 03/08/2011, 11/17/2011   Influenza-Unspecified 10/30/2012, 11/29/2013, 12/02/2014, 11/25/2020   Moderna Covid-19 Vaccine Bivalent Booster 89yrs & up 12/09/2021   Moderna Sars-Covid-2 Vaccination 02/08/2019, 03/11/2019   PFIZER(Purple Top)SARS-COV-2 Vaccination 10/09/2019   PPD Test 12/22/2015, 01/05/2016   Pneumococcal Conjugate-13 07/17/2014   Pneumococcal Polysaccharide-23  08/15/2005, 09/25/2015   Tetanus 12/24/2012   Unspecified SARS-COV-2 Vaccination 02/11/2019, 12/17/2019, 10/27/2020   Zoster Recombinat (Shingrix) 06/07/2019, 09/06/2019   Zoster, Live 12/05/2013   Pertinent  Health Maintenance Due  Topic Date Due   INFLUENZA VACCINE  09/08/2022   DEXA SCAN  Completed      01/12/2021   10:01 PM 01/13/2021    8:14 AM 06/30/2021    3:06 PM 11/09/2021   11:37 AM 01/18/2022   10:20 AM  Fall Risk  Falls in the past year?   0 0 0  Was there an injury with Fall?   0 0 0  Fall Risk Category Calculator   0 0 0  Fall Risk Category (Retired)   Low Low Low  (RETIRED) Patient Fall Risk Level   High fall risk Low fall risk Low fall risk  Patient at Risk for Falls Due to   Impaired mobility;Impaired balance/gait;History of fall(s) No Fall Risks No Fall Risks  Fall risk Follow up    Falls evaluation completed Falls evaluation completed     Information is confidential and restricted. Go to Review Flowsheets to unlock data.   Functional Status Survey:    Vitals:   05/13/22 0806  BP: (!) 156/67  Pulse: 70  Resp: 18  Temp: (!) 96.7 F (35.9 C)  SpO2: 94%  Weight: 87 lb 3.2 oz (39.6 kg)  Height: 5\' 4"  (1.626 m)   Body mass index is 14.97 kg/m. Physical Exam Vitals and nursing note reviewed.  Constitutional:      Appearance: Normal appearance.  HENT:     Head: Normocephalic.     Nose: Nose normal.  Mouth/Throat:     Mouth: Mucous membranes are moist.  Eyes:     General:        Right eye: No discharge.        Left eye: No discharge.  Cardiovascular:     Rate and Rhythm: Normal rate.     Heart sounds: No murmur heard. Pulmonary:     Effort: Pulmonary effort is normal.     Breath sounds: No rales.  Abdominal:     General: Bowel sounds are normal.     Palpations: Abdomen is soft.     Tenderness: There is no abdominal tenderness.  Musculoskeletal:     Cervical back: Normal range of motion and neck supple.     Right lower leg: No edema.      Left lower leg: No edema.     Comments: Right hip s/p ORIF 09/08/20, healed.   Skin:    General: Skin is warm and dry.  Neurological:     General: No focal deficit present.     Mental Status: She is alert and oriented to person, place, and time. Mental status is at baseline.     Motor: No weakness.     Coordination: Coordination normal.     Gait: Gait abnormal.     Comments: Ambulates with walker.   Psychiatric:        Mood and Affect: Mood normal.        Behavior: Behavior normal.     Comments: Confused, appears anxious, in hurry      Labs reviewed: Recent Labs    06/01/21 0000 06/30/21 1605 07/22/21 0000  NA 140 137 138  K 4.2 4.4 3.9  CL 101 98 101  CO2 30* 31 32*  GLUCOSE  --  91  --   BUN 17 23 17   CREATININE 0.6 0.68 0.7  CALCIUM 9.2 10.1 8.8   Recent Labs    06/01/21 0000 06/30/21 1605 07/22/21 0000  AST 23 25 21   ALT 17 20 16   ALKPHOS 67 62  --   BILITOT  --  0.4  --   PROT  --  7.6  --   ALBUMIN 4.0 4.3 3.7   Recent Labs    06/01/21 0000 06/30/21 1605 07/22/21 0000  WBC 5.5 6.5 4.6  NEUTROABS 3,031.00 4.1  --   HGB 13.8 13.3 12.5  HCT 43 40.8 38  MCV  --  92.8  --   PLT 180 199.0 206   Lab Results  Component Value Date   TSH 2.25 06/30/2021   Lab Results  Component Value Date   HGBA1C 5.5 01/06/2021   Lab Results  Component Value Date   CHOL 182 01/06/2021   HDL 58 01/06/2021   LDLCALC 117 (H) 01/06/2021   LDLDIRECT 135.4 12/26/2008   TRIG 37 01/06/2021   CHOLHDL 3.1 01/06/2021    Significant Diagnostic Results in last 30 days:  No results found.  Assessment/Plan Depression with suicidal ideation hx of suicidal attempt/ideations, anxious at times,  on Prozac, Zyprexa, Depakote. Mirtazapine made her AMS, will try prn Lorazepam 0.25mg  qd x 14 days, update CBC/diff, CMP/eGFR, TSH, lipid panel.   Osteoarthritis, multiple sites stable, T11, T12, L1 compression fx, s/p T12 Kyphoplasty, takes Tylenol, Gabapentin, ambulates with  walker.   Failure to thrive in adult  stable, low body weight,  didn't tolerate Mirtazapine.  Thrombocytopenia (HCC)  plt 206 07/22/21  Hyperlipidemia diet, LDL 117 01/06/21  Allergic rhinitis Stable,  takes Zyrtec.  Vitamin D deficiency  takes Vit d daily, Vit D 56.33 06/30/21  Vitamin B12 deficiency  Vit B12 level >1504 06/30/21, takes Vit B12  Senile dementia (HCC) TSH 2.25, Vit B12 >1504 06/30/21,  SNF [facility] for supportive care. MMSE 27/30 04/08/21  Slow transit constipation Stable,  takes Senokot S, MiraLax.   HTN (hypertension) Blood pressure is controlled, off Amlodipine. Bun/creat 17/0.7 07/22/21  Blood loss anemia Iron 58, Hgb 13.3 06/30/21  Osteoporosis  never received Evenity, taking Vit D, Ca. DEXA 10/06/21 T score -1.935,  may consider Prolia.      Family/ staff Communication: plan of care reviewed with the patient and charge nurse.   Labs/tests ordered:  CBC/diff, CMP/eGFR, TSH, lipid panel   Time spend 35 minutes

## 2022-05-13 NOTE — Assessment & Plan Note (Signed)
hx of suicidal attempt/ideations, anxious at times,  on Prozac, Zyprexa, Depakote. Mirtazapine made her AMS, will try prn Lorazepam 0.25mg  qd x 14 days, update CBC/diff, CMP/eGFR, TSH, lipid panel.

## 2022-05-13 NOTE — Assessment & Plan Note (Signed)
Stable,  takes Senokot S, MiraLax. 

## 2022-05-17 DIAGNOSIS — M6281 Muscle weakness (generalized): Secondary | ICD-10-CM | POA: Diagnosis not present

## 2022-05-17 DIAGNOSIS — E559 Vitamin D deficiency, unspecified: Secondary | ICD-10-CM | POA: Diagnosis not present

## 2022-05-17 DIAGNOSIS — E785 Hyperlipidemia, unspecified: Secondary | ICD-10-CM | POA: Diagnosis not present

## 2022-05-17 DIAGNOSIS — I10 Essential (primary) hypertension: Secondary | ICD-10-CM | POA: Diagnosis not present

## 2022-05-17 DIAGNOSIS — N3946 Mixed incontinence: Secondary | ICD-10-CM | POA: Diagnosis not present

## 2022-05-17 LAB — BASIC METABOLIC PANEL
BUN: 19 (ref 4–21)
CO2: 31 — AB (ref 13–22)
Chloride: 101 (ref 99–108)
Creatinine: 0.7 (ref 0.5–1.1)
Glucose: 75
Potassium: 4.4 mEq/L (ref 3.5–5.1)
Sodium: 138 (ref 137–147)

## 2022-05-17 LAB — LIPID PANEL
Cholesterol: 204 — AB (ref 0–200)
HDL: 67 (ref 35–70)
LDL Cholesterol: 122
Triglycerides: 55 (ref 40–160)

## 2022-05-17 LAB — CBC AND DIFFERENTIAL
HCT: 39 (ref 36–46)
Hemoglobin: 12.6 (ref 12.0–16.0)
Neutrophils Absolute: 2627
Platelets: 173 10*3/uL (ref 150–400)
WBC: 5.1

## 2022-05-17 LAB — COMPREHENSIVE METABOLIC PANEL
Albumin: 3.7 (ref 3.5–5.0)
Calcium: 9.1 (ref 8.7–10.7)
Globulin: 2.5

## 2022-05-17 LAB — HEPATIC FUNCTION PANEL
ALT: 15 U/L (ref 7–35)
AST: 19 (ref 13–35)
Alkaline Phosphatase: 46 (ref 25–125)
Bilirubin, Total: 0.4

## 2022-05-17 LAB — TSH: TSH: 1.63 (ref 0.41–5.90)

## 2022-05-17 LAB — CBC: RBC: 4.06 (ref 3.87–5.11)

## 2022-05-20 ENCOUNTER — Ambulatory Visit (HOSPITAL_COMMUNITY)
Admission: RE | Admit: 2022-05-20 | Discharge: 2022-05-20 | Disposition: A | Discharge status: Home / Self Care | Payer: Medicare Other | Source: Ambulatory Visit | Attending: Neurosurgery | Admitting: Neurosurgery

## 2022-05-20 DIAGNOSIS — M810 Age-related osteoporosis without current pathological fracture: Secondary | ICD-10-CM | POA: Diagnosis not present

## 2022-05-20 DIAGNOSIS — S22080A Wedge compression fracture of T11-T12 vertebra, initial encounter for closed fracture: Secondary | ICD-10-CM | POA: Diagnosis not present

## 2022-05-20 DIAGNOSIS — M4316 Spondylolisthesis, lumbar region: Secondary | ICD-10-CM | POA: Diagnosis not present

## 2022-05-20 DIAGNOSIS — M545 Low back pain, unspecified: Secondary | ICD-10-CM | POA: Diagnosis not present

## 2022-05-20 DIAGNOSIS — M47816 Spondylosis without myelopathy or radiculopathy, lumbar region: Secondary | ICD-10-CM | POA: Diagnosis not present

## 2022-05-24 DIAGNOSIS — M6281 Muscle weakness (generalized): Secondary | ICD-10-CM | POA: Diagnosis not present

## 2022-05-24 DIAGNOSIS — N3946 Mixed incontinence: Secondary | ICD-10-CM | POA: Diagnosis not present

## 2022-05-31 DIAGNOSIS — N3946 Mixed incontinence: Secondary | ICD-10-CM | POA: Diagnosis not present

## 2022-05-31 DIAGNOSIS — M6281 Muscle weakness (generalized): Secondary | ICD-10-CM | POA: Diagnosis not present

## 2022-06-01 DIAGNOSIS — Z681 Body mass index (BMI) 19 or less, adult: Secondary | ICD-10-CM | POA: Diagnosis not present

## 2022-06-01 DIAGNOSIS — M47816 Spondylosis without myelopathy or radiculopathy, lumbar region: Secondary | ICD-10-CM | POA: Diagnosis not present

## 2022-06-03 DIAGNOSIS — G4700 Insomnia, unspecified: Secondary | ICD-10-CM | POA: Diagnosis not present

## 2022-06-03 DIAGNOSIS — F03A3 Unspecified dementia, mild, with mood disturbance: Secondary | ICD-10-CM | POA: Diagnosis not present

## 2022-06-03 DIAGNOSIS — F332 Major depressive disorder, recurrent severe without psychotic features: Secondary | ICD-10-CM | POA: Diagnosis not present

## 2022-06-03 DIAGNOSIS — F411 Generalized anxiety disorder: Secondary | ICD-10-CM | POA: Diagnosis not present

## 2022-06-07 DIAGNOSIS — M6281 Muscle weakness (generalized): Secondary | ICD-10-CM | POA: Diagnosis not present

## 2022-06-07 DIAGNOSIS — N3946 Mixed incontinence: Secondary | ICD-10-CM | POA: Diagnosis not present

## 2022-06-14 DIAGNOSIS — N3946 Mixed incontinence: Secondary | ICD-10-CM | POA: Diagnosis not present

## 2022-06-14 DIAGNOSIS — R41841 Cognitive communication deficit: Secondary | ICD-10-CM | POA: Diagnosis not present

## 2022-06-20 DIAGNOSIS — M47816 Spondylosis without myelopathy or radiculopathy, lumbar region: Secondary | ICD-10-CM | POA: Diagnosis not present

## 2022-06-23 DIAGNOSIS — N3946 Mixed incontinence: Secondary | ICD-10-CM | POA: Diagnosis not present

## 2022-06-23 DIAGNOSIS — R41841 Cognitive communication deficit: Secondary | ICD-10-CM | POA: Diagnosis not present

## 2022-06-28 ENCOUNTER — Non-Acute Institutional Stay (SKILLED_NURSING_FACILITY): Payer: Medicare Other | Admitting: Family Medicine

## 2022-06-28 DIAGNOSIS — R41841 Cognitive communication deficit: Secondary | ICD-10-CM | POA: Diagnosis not present

## 2022-06-28 DIAGNOSIS — M545 Low back pain, unspecified: Secondary | ICD-10-CM

## 2022-06-28 DIAGNOSIS — N3946 Mixed incontinence: Secondary | ICD-10-CM | POA: Diagnosis not present

## 2022-06-28 DIAGNOSIS — R45851 Suicidal ideations: Secondary | ICD-10-CM

## 2022-06-28 DIAGNOSIS — F332 Major depressive disorder, recurrent severe without psychotic features: Secondary | ICD-10-CM

## 2022-06-28 DIAGNOSIS — S72001A Fracture of unspecified part of neck of right femur, initial encounter for closed fracture: Secondary | ICD-10-CM

## 2022-06-28 DIAGNOSIS — F4323 Adjustment disorder with mixed anxiety and depressed mood: Secondary | ICD-10-CM

## 2022-06-28 DIAGNOSIS — I10 Essential (primary) hypertension: Secondary | ICD-10-CM

## 2022-06-28 NOTE — Progress Notes (Addendum)
Provider:  Jacalyn Lefevre, MD Location:      Place of Service:     PCP: Tanya Walker, Tanya X, NP Patient Care Team: Tanya Walker, Tanya X, NP as PCP - General (Internal Medicine)  Extended Emergency Contact Information Primary Emergency Contact: Tanya Walker, [name] Address: [address]          [place], [place] [address] [place] Home Phone: [phone] Relation: Spouse Secondary Emergency Contact: Tanya Walker Phone: [phone] Relation: Daughter  Code Status:  Goals of Care: Advanced Directive information    05/13/2022    8:16 AM  Advanced Directives  Does Patient Have a Medical Advance Directive? Yes  Type of Estate agent of [place];Living will  Does patient want to make changes to medical advance directive? No - Patient declined  Copy of Healthcare Power of Attorney in Chart? Yes - validated most recent copy scanned in chart (See row information)      No chief complaint on file.   HPI: Patient is a 82 y.o. female seen today for medical management of chronic problems including depression, dementia, anemia, hypertension, chronic back pain.  Frequently see patient ambulating around the halls with her walker.  Today found her in bed at 11 AM.  She reports no new complaints.  She is said to have dementia but she was able to tell me the day what she ate for supper last night and makes lots of symptoms when questioned otherwise. She has a history of anemia but most recent hemoglobin was 13.  Also a history of hypertension but she is off antihypertensives. History of depression but I do note that she is on Prozac which is probably not the best antihypertensive when anxiety is present For her chronic back pain she has had kyphoplasty has compression fractures at T11-12 and S1.  She takes Tylenol and gabapentin.  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   Depression    years ago   Headache    sinus headaches   Lipoma of arm    Right   Medical history non-contributory    Osteoporosis    SVD (spontaneous vaginal delivery)    Walker 2   Varicose vein    Vitamin D deficiency    Past Surgical History:  Procedure Laterality Date   ANTERIOR APPROACH HEMI HIP ARTHROPLASTY Right 09/04/2020   Procedure: ANTERIOR APPROACH HEMI HIP ARTHROPLASTY;  Surgeon: Tanya Frederic, MD;  Location: [facility];  Service: Orthopedics;  Laterality: Right;   APPENDECTOMY  1947   CHOLECYSTECTOMY  1988   COLONOSCOPY     GANGLION CYST EXCISION     x2 left and right arm   HYSTEROSCOPY WITH D & C N/A 07/15/2013   Procedure: DILATATION AND CURETTAGE /HYSTEROSCOPY;  Surgeon: Tanya Aden, MD;  Location: [facility];  Service: Gynecology;  Laterality: N/A;   KYPHOPLASTY Bilateral 05/04/2020   Procedure: KYPHOPLASTY THORACIC TWELVE;  Surgeon: Tanya Person, MD;  Location: [facility];  Service: Neurosurgery;  Laterality: Bilateral;   MANDIBLE SURGERY  1954   WISDOM TOOTH EXTRACTION      reports that she has never smoked. She has never used smokeless tobacco. She reports that she does not drink alcohol and does not use drugs. Social History   Socioeconomic History   Marital status: Married  Spouse name: Not on file   Number of children: 1   Years of education: Not on file   Highest education level: Not on file  Occupational History   Occupation: retired  Tobacco Use   Smoking status: Never   Smokeless tobacco: Never  Vaping Use   Vaping Use: Never used  Substance and Sexual Activity   Alcohol use: No   Drug use: No   Sexual activity: Yes    Birth control/protection: Post-menopausal  Other Topics Concern   Not on file  Social History Narrative   GI in [place] for colonoscopy   Dr Tanya Walker - GYN      Denies Surgical history      Family history of varicose veins   Mother is 70      Regular exercise - NO   Social  Determinants of Health   Financial Resource Strain: Low Risk  (10/10/2019)   Overall Financial Resource Strain (CARDIA)    Difficulty of Paying Living Expenses: Not hard at all  Food Insecurity: No Food Insecurity (10/10/2019)   Hunger Vital Sign    Worried About Running Out of Food in the Last Year: Never true    Ran Out of Food in the Last Year: Never true  Transportation Needs: No Transportation Needs (10/10/2019)   PRAPARE - Administrator, Civil Service (Medical): No    Lack of Transportation (Non-Medical): No  Physical Activity: Sufficiently Active (10/10/2019)   Exercise Vital Sign    Days of Exercise per Week: 5 days    Minutes of Exercise per Session: 30 min  Stress: Stress Concern Present (10/10/2019)   [employer] of Occupational Health - Occupational Stress Questionnaire    Feeling of Stress : Rather much  Social Connections: Moderately Integrated (10/10/2019)   Social Connection and Isolation Panel [NHANES]    Frequency of Communication with Friends and Family: More than three times a week    Frequency of Social Gatherings with Friends and Family: Never    Attends Religious Services: Never    Database administrator or Organizations: Yes    Attends Banker Meetings: Never    Marital Status: Married  Catering manager Violence: Not At Risk (10/10/2019)   Humiliation, Afraid, Rape, and Kick questionnaire    Fear of Current or Ex-Partner: No    Emotionally Abused: No    Physically Abused: No    Sexually Abused: No    Functional Status Survey:    Family History  Problem Relation Age of Onset   Hypertension Mother    Heart disease Father    Cancer Father     Health Maintenance  Topic Date Due   COVID-19 Vaccine (8 - 2023-24 season) 02/03/2022   INFLUENZA VACCINE  09/08/2022   DTaP/Tdap/Td (3 - Tdap) 12/25/2022   Medicare Annual Wellness (AWV)  03/01/2023   Pneumonia Vaccine 15+ Years old  Completed   DEXA SCAN  Completed   Zoster Vaccines-  Shingrix  Completed   HPV VACCINES  Aged Out    Allergies  Allergen Reactions   Actonel [Risedronate] Other (See Comments)    "Allergic," per MAR   Alendronate Sodium Other (See Comments)    Leg cramps and "Allergic," per MAR   Bisphosphonates    Fosamax [Alendronate]    Remeron [Mirtazapine]    Risedronate Sodium Other (See Comments)    Caused the patient to be achy and is "Allergic," per MAR   Tramadol Other (See Comments)    Caused  the patient to feel badly    Keflex [Cephalexin] Other (See Comments)    Possible rash    Outpatient Encounter Medications as of 06/28/2022  Medication Sig   acetaminophen (TYLENOL) 325 MG tablet Take 650 mg by mouth in the morning and at bedtime.   ASPIRIN 81 PO Take 1 tablet by mouth daily.   Calcium Carbonate (CALCIUM-CARB 600 PO) Take 1 tablet by mouth 2 (two) times daily.   cetirizine (ZYRTEC ALLERGY) 10 MG tablet Take 1 tablet (10 mg total) by mouth daily.   Cholecalciferol (VITAMIN D3) 2000 units capsule Take 1 capsule (2,000 Units total) by mouth daily.   Cobalamin Combinations (VITAMIN B12-FOLIC ACID PO) Place 1 lozenge under the tongue in the morning.   divalproex (DEPAKOTE SPRINKLE) 125 MG capsule Take 125 mg by mouth 2 (two) times daily.   docusate sodium (COLACE) 100 MG capsule Take 100 mg by mouth at bedtime.   FLUoxetine (PROZAC) 20 MG capsule Take 1 capsule (20 mg total) by mouth at bedtime.   gabapentin (NEURONTIN) 100 MG capsule Take 1 capsule (100 mg total) by mouth 3 (three) times daily.   KETOCONAZOLE, TOPICAL, (NIZORAL A-D) 1 % SHAM Apply 1 application. topically once a week. Monday   Liniments (BLUE-EMU SUPER STRENGTH) CREA Apply 4 oz topically. Apply thin layer to the back.   LORazepam (ATIVAN) 0.5 MG tablet Take 0.5 mg by mouth daily. AS NEEDED   melatonin 5 MG TABS Take 5 mg by mouth at bedtime.   Multiple Vitamins-Minerals (PRESERVISION AREDS 2 PO) Take 1 capsule by mouth 2 (two) times daily.   OLANZapine (ZYPREXA) 7.5 MG  tablet Take 7.5 mg by mouth at bedtime.   polyethylene glycol (MIRALAX / GLYCOLAX) 17 g packet Take 17 g by mouth daily.   senna-docusate (SENOKOT-S) 8.6-50 MG tablet Take 2 tablets by mouth daily.   No facility-administered encounter medications on file as of 06/28/2022.    Review of Systems  Constitutional: Negative.   HENT: Negative.    Respiratory: Negative.    Cardiovascular: Negative.   Genitourinary: Negative.   Musculoskeletal:  Positive for gait problem.  Neurological:  Positive for dizziness.  Hematological: Negative.   Psychiatric/Behavioral:  Positive for hallucinations.     There were no vitals filed for this visit. There is no height or weight on file to calculate BMI. Physical Exam Vitals and nursing note reviewed.  Constitutional:      Appearance: Normal appearance.  HENT:     Head: Normocephalic.     Nose: Nose normal.     Mouth/Throat:     Mouth: Mucous membranes are moist.     Pharynx: Oropharynx is clear.  Eyes:     Extraocular Movements: Extraocular movements intact.     Conjunctiva/sclera: Conjunctivae normal.     Pupils: Pupils are equal, round, and reactive to light.  Cardiovascular:     Rate and Rhythm: Normal rate.  Pulmonary:     Effort: Pulmonary effort is normal.  Abdominal:     General: Abdomen is flat.  Musculoskeletal:     Cervical back: Normal range of motion.  Skin:    General: Skin is warm and dry.  Neurological:     Mental Status: She is oriented to Walker, place, and time.  Psychiatric:        Mood and Affect: Mood normal.        Behavior: Behavior normal.     Labs reviewed: Basic Metabolic Panel: Recent Labs    06/30/21 1605 07/22/21 0000  NA 137 138  K 4.4 3.9  CL 98 101  CO2 31 32*  GLUCOSE 91  --   BUN 23 17  CREATININE 0.68 0.7  CALCIUM 10.1 8.8   Liver Function Tests: Recent Labs    06/30/21 1605 07/22/21 0000  AST 25 21  ALT 20 16  ALKPHOS 62  --   BILITOT 0.4  --   PROT 7.6  --   ALBUMIN 4.3 3.7    No results for input(s): "LIPASE", "AMYLASE" in the last 8760 hours. No results for input(s): "AMMONIA" in the last 8760 hours. CBC: Recent Labs    06/30/21 1605 07/22/21 0000  WBC 6.5 4.6  NEUTROABS 4.1  --   HGB 13.3 12.5  HCT 40.8 38  MCV 92.8  --   PLT 199.0 206   Cardiac Enzymes: No results for input(s): "CKTOTAL", "CKMB", "CKMBINDEX", "TROPONINI" in the last 8760 hours. BNP: Invalid input(s): "POCBNP" Lab Results  Component Value Date   HGBA1C 5.5 01/06/2021   Lab Results  Component Value Date   TSH 2.25 06/30/2021   Lab Results  Component Value Date   VITAMINB12 1,892 12/10/2021   No results found for: "FOLATE" Lab Results  Component Value Date   IRON 58 06/30/2021   TIBC 313 06/30/2021   FERRITIN 72 06/30/2021    Imaging and Procedures obtained prior to SNF admission: MR LUMBAR SPINE WO CONTRAST  Result Date: 05/23/2022 CLINICAL DATA:  Chronic back pain, osteoporosis EXAM: MRI LUMBAR SPINE WITHOUT CONTRAST TECHNIQUE: Multiplanar, multisequence MR imaging of the lumbar spine was performed. No intravenous contrast was administered. COMPARISON:  06/02/2020 FINDINGS: Segmentation:  Standard. Alignment:  3 mm retrolisthesis of L3 on L4. Vertebrae: No acute fracture, evidence of discitis, or aggressive bone lesion. Chronic T11 vertebral body compression fracture with approximately 60% anterior height loss and minimal residual marrow edema along the anterior-most aspect which may be related to disc disease. Chronic T12 vertebral body compression fracture with evidence of prior augmentation. Chronic L1 vertebral body compression fracture with approximately 60% height loss. Conus medullaris and cauda equina: Conus extends to the L1-2 level. Conus and cauda equina appear normal. Paraspinal and other soft tissues: No acute paraspinal abnormality. Disc levels: Disc spaces: Degenerative disease with disc height loss at L2-3, L3-4, and L5-S1. disc desiccation at L4-5 and L1-2.  T11-12: Mild broad-based disc bulge. Mild bilateral facet arthropathy. Mild right foraminal stenosis. No left foraminal stenosis. No spinal stenosis. T12-L1: No disc protrusion. Mild bilateral facet arthropathy. No foraminal stenosis. L1-L2: Minimal broad-based disc bulge. Mild broad-based scratch them mild bilateral facet arthropathy. Mild spinal stenosis. No foraminal stenosis. L2-L3: Broad-based disc bulge. Mild bilateral facet arthropathy. Mild spinal stenosis. No foraminal stenosis. L3-L4: Broad-based disc bulge. Moderate bilateral facet arthropathy with a small right facet effusion. Bilateral subarticular recess stenosis. Mild spinal stenosis. Mild bilateral foraminal stenosis. L4-L5: Mild broad-based disc bulge. Mild bilateral facet arthropathy. Bilateral lateral recess stenosis. Mild spinal stenosis. No foraminal stenosis. L5-S1: Mild broad-based disc bulge. Mild bilateral facet arthropathy. No significant foraminal stenosis. No spinal stenosis. IMPRESSION: 1. No acute osseous injury of the lumbar spine. 2. Lumbar spine spondylosis as described above. 3. Chronic T11 vertebral body compression fracture with approximately 60% anterior height loss and minimal residual marrow edema along the anterior-most aspect which may be related to disc disease. Chronic T12 vertebral body compression fracture with evidence of prior augmentation. Chronic L1 vertebral body compression fracture with approximately 60% height loss. Electronically Signed   By: Dillard Cannon.D.  On: 05/23/2022 12:31    Assessment/Plan  1. Adjustment disorder with mixed anxiety and depressed mood  Medications include Prozac Zyprexa Depakote had attempted lower getting her off Zyprexa without success.  I would like to try different antidepressant and Prozac given her anxiety   3. Depression with suicidal ideation See above will try sertraline  4. Hypertension, unspecified type Off medicines.  Blood pressure is on the low side  5.  Acute bilateral low back pain without sciatica Multiple back issues including kyphoplasty with compression fractures.  Ambulates with walker  6. Severe episode of recurrent major depressive disorder, without psychotic features (HCC) History of suicide attempt in mediations.  Definitely has continued need for antidepressant    Family/ staff Communication:   Labs/tests ordered:  Bertram Millard. Hyacinth Meeker, MD [facility] [address] [place], [place] [address] Office [phone]

## 2022-06-29 DIAGNOSIS — I10 Essential (primary) hypertension: Secondary | ICD-10-CM | POA: Diagnosis not present

## 2022-06-29 DIAGNOSIS — R627 Adult failure to thrive: Secondary | ICD-10-CM | POA: Diagnosis not present

## 2022-06-30 DIAGNOSIS — M47816 Spondylosis without myelopathy or radiculopathy, lumbar region: Secondary | ICD-10-CM | POA: Diagnosis not present

## 2022-07-06 DIAGNOSIS — R41841 Cognitive communication deficit: Secondary | ICD-10-CM | POA: Diagnosis not present

## 2022-07-06 DIAGNOSIS — N3946 Mixed incontinence: Secondary | ICD-10-CM | POA: Diagnosis not present

## 2022-07-08 DIAGNOSIS — F411 Generalized anxiety disorder: Secondary | ICD-10-CM | POA: Diagnosis not present

## 2022-07-08 DIAGNOSIS — F03A3 Unspecified dementia, mild, with mood disturbance: Secondary | ICD-10-CM | POA: Diagnosis not present

## 2022-07-08 DIAGNOSIS — G4700 Insomnia, unspecified: Secondary | ICD-10-CM | POA: Diagnosis not present

## 2022-07-08 DIAGNOSIS — F332 Major depressive disorder, recurrent severe without psychotic features: Secondary | ICD-10-CM | POA: Diagnosis not present

## 2022-07-12 DIAGNOSIS — N3946 Mixed incontinence: Secondary | ICD-10-CM | POA: Diagnosis not present

## 2022-07-12 DIAGNOSIS — M6281 Muscle weakness (generalized): Secondary | ICD-10-CM | POA: Diagnosis not present

## 2022-07-18 ENCOUNTER — Non-Acute Institutional Stay (SKILLED_NURSING_FACILITY): Payer: Medicare Other | Admitting: Nurse Practitioner

## 2022-07-18 ENCOUNTER — Encounter: Payer: Self-pay | Admitting: Nurse Practitioner

## 2022-07-18 DIAGNOSIS — I10 Essential (primary) hypertension: Secondary | ICD-10-CM | POA: Diagnosis not present

## 2022-07-18 DIAGNOSIS — E785 Hyperlipidemia, unspecified: Secondary | ICD-10-CM | POA: Diagnosis not present

## 2022-07-18 DIAGNOSIS — E559 Vitamin D deficiency, unspecified: Secondary | ICD-10-CM | POA: Diagnosis not present

## 2022-07-18 DIAGNOSIS — K5901 Slow transit constipation: Secondary | ICD-10-CM

## 2022-07-18 DIAGNOSIS — R45851 Suicidal ideations: Secondary | ICD-10-CM | POA: Diagnosis not present

## 2022-07-18 DIAGNOSIS — F32A Depression, unspecified: Secondary | ICD-10-CM

## 2022-07-18 DIAGNOSIS — D500 Iron deficiency anemia secondary to blood loss (chronic): Secondary | ICD-10-CM | POA: Diagnosis not present

## 2022-07-18 DIAGNOSIS — M159 Polyosteoarthritis, unspecified: Secondary | ICD-10-CM | POA: Diagnosis not present

## 2022-07-18 DIAGNOSIS — F0390 Unspecified dementia without behavioral disturbance: Secondary | ICD-10-CM

## 2022-07-18 DIAGNOSIS — E538 Deficiency of other specified B group vitamins: Secondary | ICD-10-CM | POA: Diagnosis not present

## 2022-07-18 NOTE — Assessment & Plan Note (Signed)
Iron 58, Hgb 13.3 06/30/21, update CBC/diff, on ASA, Depakote too.

## 2022-07-18 NOTE — Assessment & Plan Note (Signed)
Vit B12 level >1504 06/30/21, takes Vit B12 

## 2022-07-18 NOTE — Assessment & Plan Note (Signed)
diet, LDL 117 01/06/21 

## 2022-07-18 NOTE — Assessment & Plan Note (Signed)
takes Vit d daily, Vit D 56.33 06/30/21 

## 2022-07-18 NOTE — Progress Notes (Signed)
Location:   SNF [facility] Room Number: 30 Place of Service:  SNF (31) Provider: Arna Snipe Jaquilla Woodroof NP  Peighton Mehra X, NP  Patient Care Team: Breniyah Romm X, NP as PCP - General (Internal Medicine)  Extended Emergency Contact Information Primary Emergency Contact: Roizy, Dexheimer Address: [address]          [place], [place] [address] [place] Home Phone: [phone] Relation: Spouse Secondary Emergency Contact: [facility] Phone: [phone] Relation: Daughter  Code Status: DNR Goals of care: Advanced Directive information    05/13/2022    8:16 AM  Advanced Directives  Does Patient Have a Medical Advance Directive? Yes  Type of Estate agent of [place];Living will  Does patient want to make changes to medical advance directive? No - Patient declined  Copy of Healthcare Power of Attorney in Chart? Yes - validated most recent copy scanned in chart (See row information)     Chief Complaint  Patient presents with   Medical Management of Chronic Issues    HPI:  Pt is a 82 y.o. female seen today for managing chronic medical conditions.    OP, never received Evenity, taking Vit D, Ca. DEXA 10/06/21 T score -1.935,  may consider Prolia.              Anemia, Iron 58, Hgb 13.3 06/30/21             HTN, off Amlodipine. Bun/creat 17/0.7 07/22/21             Constipation, takes Senokot S, MiraLax.              Dementia, TSH 2.25, Vit B12 >1504 06/30/21,  SNF [facility] for supportive care. MMSE 27/30 04/08/21             Vit B12 deficiency, Vit B12 level >1504 06/30/21, takes Vit B12             Vitamin D deficiency, takes Vit d daily, Vit D 56.33 06/30/21             Allergic rhinitis, takes Zyrtec.             Hyperlipidemia, diet, LDL 117 01/06/21             Depression, hx of suicidal attempt/ideations, anxious at times,  on Prozac, Zyprexa, Depakote. Mirtazapine made her AMS             Chronic back pain, stable, T11, T12, L1 compression fx, s/p  T12 Kyphoplasty, takes Tylenol, Gabapentin, ambulates with walker, followed by [facility].              Weight loss/adult failure to thrive, stable, low body weight,  didn't tolerate Mirtazapine.             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   Depression    years ago   Headache    sinus headaches   Lipoma of arm    Right   Medical history non-contributory    Osteoporosis    SVD (spontaneous vaginal delivery)    x 2   Varicose vein    Vitamin D deficiency    Past Surgical History:  Procedure Laterality Date   ANTERIOR APPROACH HEMI HIP ARTHROPLASTY Right 09/04/2020   Procedure: ANTERIOR APPROACH HEMI HIP ARTHROPLASTY;  Surgeon:  Samson Frederic, MD;  Location: [facility];  Service: Orthopedics;  Laterality: Right;   APPENDECTOMY  1947   CHOLECYSTECTOMY  1988   COLONOSCOPY     GANGLION CYST EXCISION     x2 left and right arm   HYSTEROSCOPY WITH D & C N/A 07/15/2013   Procedure: DILATATION AND CURETTAGE /HYSTEROSCOPY;  Surgeon: Lenoard Aden, MD;  Location: [facility];  Service: Gynecology;  Laterality: N/A;   KYPHOPLASTY Bilateral 05/04/2020   Procedure: KYPHOPLASTY THORACIC TWELVE;  Surgeon: Bedelia Person, MD;  Location: [facility] OR;  Service: Neurosurgery;  Laterality: Bilateral;   MANDIBLE SURGERY  1954   WISDOM TOOTH EXTRACTION      Allergies  Allergen Reactions   Actonel [Risedronate] Other (See Comments)    "Allergic," per MAR   Alendronate Sodium Other (See Comments)    Leg cramps and "Allergic," per MAR   Bisphosphonates    Fosamax [Alendronate]    Remeron [Mirtazapine]    Risedronate Sodium Other (See Comments)    Caused the patient to be achy and is "Allergic," per MAR   Tramadol Other (See Comments)    Caused the patient to feel badly    Keflex [Cephalexin] Other (See Comments)    Possible rash    Allergies as of 07/18/2022        Reactions   Actonel [risedronate] Other (See Comments)   "Allergic," per MAR   Alendronate Sodium Other (See Comments)   Leg cramps and "Allergic," per MAR   Bisphosphonates    Fosamax [alendronate]    Remeron [mirtazapine]    Risedronate Sodium Other (See Comments)   Caused the patient to be achy and is "Allergic," per MAR   Tramadol Other (See Comments)   Caused the patient to feel badly    Keflex [cephalexin] Other (See Comments)   Possible rash        Medication List        Accurate as of July 18, 2022  1:31 PM. If you have any questions, ask your nurse or doctor.          acetaminophen 325 MG tablet Commonly known as: TYLENOL Take 650 mg by mouth in the morning and at bedtime.   ASPIRIN 81 PO Take 1 tablet by mouth daily.   Blue-Emu Super Strength Crea Apply 4 oz topically. Apply thin layer to the back.   CALCIUM-CARB 600 PO Take 1 tablet by mouth 2 (two) times daily.   cetirizine 10 MG tablet Commonly known as: ZyrTEC Allergy Take 1 tablet (10 mg total) by mouth daily.   divalproex 125 MG capsule Commonly known as: DEPAKOTE SPRINKLE Take 125 mg by mouth 2 (two) times daily.   docusate sodium 100 MG capsule Commonly known as: COLACE Take 100 mg by mouth at bedtime.   FLUoxetine 20 MG capsule Commonly known as: PROZAC Take 1 capsule (20 mg total) by mouth at bedtime.   gabapentin 100 MG capsule Commonly known as: NEURONTIN Take 1 capsule (100 mg total) by mouth 3 (three) times daily.   LORazepam 0.5 MG tablet Commonly known as: ATIVAN Take 0.5 mg by mouth daily. AS NEEDED   melatonin 5 MG Tabs Take 5 mg by mouth at bedtime.   Nizoral A-D 1 % Sham Generic drug: KETOCONAZOLE (TOPICAL) Apply 1 application. topically once a week. Monday   OLANZapine 7.5 MG tablet Commonly known as: ZYPREXA Take 7.5 mg by mouth at bedtime.   polyethylene glycol 17 g packet Commonly known as: MIRALAX / GLYCOLAX Take  17 g by mouth daily.   PRESERVISION  AREDS 2 PO Take 1 capsule by mouth 2 (two) times daily.   senna-docusate 8.6-50 MG tablet Commonly known as: Senokot-S Take 2 tablets by mouth daily.   VITAMIN B12-FOLIC ACID PO Place 1 lozenge under the tongue in the morning.   Vitamin D3 50 MCG (2000 UT) capsule Take 1 capsule (2,000 Units total) by mouth daily.        Review of Systems  Constitutional:  Negative for activity change, fever and unexpected weight change.  HENT:  Positive for hearing loss. Negative for congestion and voice change.   Eyes:  Negative for visual disturbance.  Respiratory:  Negative for cough and shortness of breath.   Cardiovascular:  Negative for leg swelling.  Gastrointestinal:  Negative for abdominal pain and constipation.  Genitourinary:  Negative for dysuria and urgency.  Musculoskeletal:  Positive for arthralgias, back pain and gait problem.  Skin:  Negative for color change.  Neurological:  Negative for speech difficulty, weakness and headaches.       Dementia.   Psychiatric/Behavioral:  Positive for sleep disturbance. Negative for confusion and hallucinations. The patient is nervous/anxious.     Immunization History  Administered Date(s) Administered   DT (Pediatric) 04/10/2002   Fluad Quad(high Dose 65+) 10/28/2019   Influenza Split 03/08/2011, 11/17/2011   Influenza Whole 01/09/2007, 11/05/2008, 11/04/2009   Influenza, High Dose Seasonal PF 12/07/2015, 09/29/2016, 11/09/2017, 09/27/2018, 10/28/2019   Influenza,trivalent, recombinat, inj, PF 03/08/2011, 11/17/2011   Influenza-Unspecified 10/30/2012, 11/29/2013, 12/02/2014, 11/25/2020   Moderna Covid-19 Vaccine Bivalent Booster 62yrs & up 12/09/2021   Moderna Sars-Covid-2 Vaccination 02/08/2019, 03/11/2019   PFIZER(Purple Top)SARS-COV-2 Vaccination 10/09/2019   PPD Test 12/22/2015, 01/05/2016   Pneumococcal Conjugate-13 07/17/2014   Pneumococcal Polysaccharide-23 08/15/2005, 09/25/2015   Tetanus 12/24/2012   Unspecified SARS-COV-2  Vaccination 02/11/2019, 12/17/2019, 10/27/2020   Zoster Recombinat (Shingrix) 06/07/2019, 09/06/2019   Zoster, Live 12/05/2013   Pertinent  Health Maintenance Due  Topic Date Due   INFLUENZA VACCINE  09/08/2022   DEXA SCAN  Completed      01/12/2021   10:01 PM 01/13/2021    8:14 AM 06/30/2021    3:06 PM 11/09/2021   11:37 AM 01/18/2022   10:20 AM  Fall Risk  Falls in the past year?   0 0 0  Was there an injury with Fall?   0 0 0  Fall Risk Category Calculator   0 0 0  Fall Risk Category (Retired)   Low Low Low  (RETIRED) Patient Fall Risk Level   High fall risk Low fall risk Low fall risk  Patient at Risk for Falls Due to   Impaired mobility;Impaired balance/gait;History of fall(s) No Fall Risks No Fall Risks  Fall risk Follow up    Falls evaluation completed Falls evaluation completed     Information is confidential and restricted. Go to Review Flowsheets to unlock data.   Functional Status Survey:    Vitals:   07/18/22 1309  BP: 123/78  Pulse: 69  Resp: 18  Temp: 97.8 F (36.6 C)  SpO2: 98%  Weight: 86 lb 9.6 oz (39.3 kg)   Body mass index is 14.86 kg/m. Physical Exam Vitals and nursing note reviewed.  Constitutional:      Appearance: Normal appearance.  HENT:     Head: Normocephalic.     Nose: Nose normal.     Mouth/Throat:     Mouth: Mucous membranes are moist.  Eyes:     General:  Right eye: No discharge.        Left eye: No discharge.  Cardiovascular:     Rate and Rhythm: Normal rate.     Heart sounds: No murmur heard. Pulmonary:     Effort: Pulmonary effort is normal.     Breath sounds: No rales.  Abdominal:     General: Bowel sounds are normal.     Palpations: Abdomen is soft.     Tenderness: There is no abdominal tenderness.  Musculoskeletal:     Cervical back: Normal range of motion and neck supple.     Right lower leg: No edema.     Left lower leg: No edema.     Comments: Right hip s/p ORIF 09/08/20, healed.   Skin:    General: Skin is  warm and dry.  Neurological:     General: No focal deficit present.     Mental Status: She is alert and oriented to person, place, and time. Mental status is at baseline.     Motor: No weakness.     Coordination: Coordination normal.     Gait: Gait abnormal.     Comments: Ambulates with walker.   Psychiatric:        Mood and Affect: Mood normal.        Behavior: Behavior normal.     Comments: Confused, appears anxious, in hurry      Labs reviewed: Recent Labs    07/22/21 0000  NA 138  K 3.9  CL 101  CO2 32*  BUN 17  CREATININE 0.7  CALCIUM 8.8   Recent Labs    07/22/21 0000  AST 21  ALT 16  ALBUMIN 3.7   Recent Labs    07/22/21 0000  WBC 4.6  HGB 12.5  HCT 38  PLT 206   Lab Results  Component Value Date   TSH 2.25 06/30/2021   Lab Results  Component Value Date   HGBA1C 5.5 01/06/2021   Lab Results  Component Value Date   CHOL 182 01/06/2021   HDL 58 01/06/2021   LDLCALC 117 (H) 01/06/2021   LDLDIRECT 135.4 12/26/2008   TRIG 37 01/06/2021   CHOLHDL 3.1 01/06/2021    Significant Diagnostic Results in last 30 days:  No results found.  Assessment/Plan: Blood loss anemia Iron 58, Hgb 13.3 06/30/21, update CBC/diff, on ASA, Depakote too.   HTN (hypertension) Blood pressure is controlled,  off Amlodipine. Bun/creat 17/0.7 07/22/21, update CMP/eGFR  Slow transit constipation Stable,  takes Senokot S, MiraLax.   Senile dementia (HCC) TSH 2.25, Vit B12 >1504 06/30/21,  SNF [facility] for supportive care. MMSE 27/30 04/08/21, update TSH, lipids, Vit D  Vitamin B12 deficiency  Vit B12 level >1504 06/30/21, takes Vit B12  Vitamin D deficiency  takes Vit d daily, Vit D 56.33 06/30/21  Hyperlipidemia diet, LDL 117 01/06/21  Depression with suicidal ideation Stable,  hx of suicidal attempt/ideations, anxious at times,  on Prozac, Zyprexa, Depakote. Mirtazapine made her AMS  Osteoarthritis, multiple sites stable, T11, T12, L1 compression fx, s/p T12  Kyphoplasty, takes Tylenol, Gabapentin, ambulates with walker, followed by [facility].     Family/ staff Communication: plan of care reviewed with the patient and charge nurse.   Labs/tests ordered: CBC/diff, CMP/eGFR, TSH, lipids, Vit D  Time spend 25 minutes.

## 2022-07-18 NOTE — Assessment & Plan Note (Signed)
TSH 2.25, Vit B12 >1504 06/30/21,  SNF [facility] for supportive care. MMSE 27/30 04/08/21, update TSH, lipids, Vit D

## 2022-07-18 NOTE — Assessment & Plan Note (Signed)
Blood pressure is controlled,  off Amlodipine. Bun/creat 17/0.7 07/22/21, update CMP/eGFR

## 2022-07-18 NOTE — Assessment & Plan Note (Signed)
stable, T11, T12, L1 compression fx, s/p T12 Kyphoplasty, takes Tylenol, Gabapentin, ambulates with walker, followed by [facility].

## 2022-07-18 NOTE — Assessment & Plan Note (Signed)
Stable,  takes Senokot S, MiraLax. 

## 2022-07-18 NOTE — Assessment & Plan Note (Signed)
Stable,  hx of suicidal attempt/ideations, anxious at times,  on Prozac, Zyprexa, Depakote. Mirtazapine made her AMS

## 2022-07-19 DIAGNOSIS — N3946 Mixed incontinence: Secondary | ICD-10-CM | POA: Diagnosis not present

## 2022-07-19 DIAGNOSIS — M6281 Muscle weakness (generalized): Secondary | ICD-10-CM | POA: Diagnosis not present

## 2022-07-21 DIAGNOSIS — E559 Vitamin D deficiency, unspecified: Secondary | ICD-10-CM | POA: Diagnosis not present

## 2022-07-21 DIAGNOSIS — E785 Hyperlipidemia, unspecified: Secondary | ICD-10-CM | POA: Diagnosis not present

## 2022-07-21 LAB — HEPATIC FUNCTION PANEL
ALT: 15 U/L (ref 7–35)
AST: 19 (ref 13–35)
Alkaline Phosphatase: 53 (ref 25–125)

## 2022-07-21 LAB — LIPID PANEL
Cholesterol: 221 — AB (ref 0–200)
HDL: 76 — AB (ref 35–70)
LDL Cholesterol: 129
Triglycerides: 70 (ref 40–160)

## 2022-07-21 LAB — BASIC METABOLIC PANEL
BUN: 23 — AB (ref 4–21)
CO2: 32 — AB (ref 13–22)
Chloride: 100 (ref 99–108)
Creatinine: 0.7 (ref 0.5–1.1)
Glucose: 89
Potassium: 3.6 mEq/L (ref 3.5–5.1)
Sodium: 141 (ref 137–147)

## 2022-07-21 LAB — COMPREHENSIVE METABOLIC PANEL
Albumin: 4 (ref 3.5–5.0)
Calcium: 9.3 (ref 8.7–10.7)
Globulin: 2.5

## 2022-07-21 LAB — CBC AND DIFFERENTIAL
HCT: 41 (ref 36–46)
Hemoglobin: 13.1 (ref 12.0–16.0)
Neutrophils Absolute: 2889
Platelets: 178 10*3/uL (ref 150–400)
WBC: 5.3

## 2022-07-21 LAB — TSH: TSH: 1.93 (ref 0.41–5.90)

## 2022-07-21 LAB — CBC: RBC: 4.2 (ref 3.87–5.11)

## 2022-07-26 DIAGNOSIS — N3946 Mixed incontinence: Secondary | ICD-10-CM | POA: Diagnosis not present

## 2022-07-26 DIAGNOSIS — M6281 Muscle weakness (generalized): Secondary | ICD-10-CM | POA: Diagnosis not present

## 2022-07-29 DIAGNOSIS — F03A3 Unspecified dementia, mild, with mood disturbance: Secondary | ICD-10-CM | POA: Diagnosis not present

## 2022-07-29 DIAGNOSIS — F332 Major depressive disorder, recurrent severe without psychotic features: Secondary | ICD-10-CM | POA: Diagnosis not present

## 2022-07-29 DIAGNOSIS — F411 Generalized anxiety disorder: Secondary | ICD-10-CM | POA: Diagnosis not present

## 2022-07-29 DIAGNOSIS — G4700 Insomnia, unspecified: Secondary | ICD-10-CM | POA: Diagnosis not present

## 2022-08-01 ENCOUNTER — Non-Acute Institutional Stay (SKILLED_NURSING_FACILITY): Payer: Medicare Other | Admitting: Nurse Practitioner

## 2022-08-01 ENCOUNTER — Encounter: Payer: Self-pay | Admitting: Nurse Practitioner

## 2022-08-01 DIAGNOSIS — F0390 Unspecified dementia without behavioral disturbance: Secondary | ICD-10-CM | POA: Diagnosis not present

## 2022-08-01 DIAGNOSIS — I10 Essential (primary) hypertension: Secondary | ICD-10-CM

## 2022-08-01 DIAGNOSIS — F332 Major depressive disorder, recurrent severe without psychotic features: Secondary | ICD-10-CM | POA: Diagnosis not present

## 2022-08-01 DIAGNOSIS — M159 Polyosteoarthritis, unspecified: Secondary | ICD-10-CM

## 2022-08-01 DIAGNOSIS — D500 Iron deficiency anemia secondary to blood loss (chronic): Secondary | ICD-10-CM | POA: Diagnosis not present

## 2022-08-01 DIAGNOSIS — E538 Deficiency of other specified B group vitamins: Secondary | ICD-10-CM

## 2022-08-01 DIAGNOSIS — M150 Primary generalized (osteo)arthritis: Secondary | ICD-10-CM

## 2022-08-01 DIAGNOSIS — E559 Vitamin D deficiency, unspecified: Secondary | ICD-10-CM | POA: Diagnosis not present

## 2022-08-01 DIAGNOSIS — M810 Age-related osteoporosis without current pathological fracture: Secondary | ICD-10-CM | POA: Diagnosis not present

## 2022-08-01 DIAGNOSIS — J3089 Other allergic rhinitis: Secondary | ICD-10-CM

## 2022-08-01 DIAGNOSIS — K5901 Slow transit constipation: Secondary | ICD-10-CM

## 2022-08-01 DIAGNOSIS — R350 Frequency of micturition: Secondary | ICD-10-CM | POA: Diagnosis not present

## 2022-08-01 NOTE — Progress Notes (Unsigned)
Location:   [facility]NF [facility] Room Number: 30 Place of Service:  SNF (31) Provider: Arna Snipe Slayden Mennenga NP  Emiliana Blaize X, NP  Patient Care Team: Arisa Congleton X, NP as PCP - General (Internal Medicine)  Extended Emergency Contact Information Primary Emergency Contact: Marayah, Higdon Address: [address]          [place], [place] [address] [place] Home Phone: [phone] Relation: Spouse Secondary Emergency Contact: [facility] Phone: [phone] Relation: Daughter  Code Status: DNR Goals of care: Advanced Directive information    08/01/2022    2:57 PM  Advanced Directives  Does Patient Have a Medical Advance Directive? Yes  Type of Estate agent of [place];Living will  Does patient want to make changes to medical advance directive? No - Patient declined  Copy of Healthcare Power of Attorney in Chart? Yes - validated most recent copy scanned in chart (See row information)     Chief Complaint  Patient presents with   Acute Visit    Urinary frequency    HPI:  Pt is a 82 y.o. female seen today for an acute visit for reported urinary frequency, the patient stated its not new, denied dysuria, urinary urgency or incontinency, denied abd or lower back pain, afebrile, the patient denied UA C/S or medication to control urinary frequency for now. She denied the thought of having a bladder infection.  OP, never received Evenity, taking Vit D, Ca. DEXA 10/06/21 T score -1.935,  may consider Prolia.              Anemia, Iron 58, Hgb 13.1 07/21/22             HTN, off Amlodipine. Bun/creat 23/0.67 07/21/22             Constipation, takes Senokot S, MiraLax.              Dementia, TSH 2.25, Vit B12 >1504 06/30/21,  SNF [facility] for supportive care. MMSE 27/30 04/08/21             Vit B12 deficiency, Vit B12 level >1504 06/30/21, takes Vit B12             Vitamin D deficiency, takes Vit d daily, Vit D 52 07/21/22             Allergic rhinitis, takes Zyrtec.              Hyperlipidemia, diet, LDL 129 07/21/22             Depression, hx of suicidal attempt/ideations, anxious at times,  on Prozac, Zyprexa, Depakote. Mirtazapine made her AMS, TSH 1.93 07/21/22             Chronic back pain, stable, T11, T12, L1 compression fx, s/p T12 Kyphoplasty, takes Tylenol, Gabapentin, ambulates with walker, followed by [facility].              Weight loss/adult failure to thrive, stable, low body weight,  didn't tolerate Mirtazapine.             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   Depression    years ago   Headache    sinus headaches   Lipoma of arm    Right   Medical history non-contributory    Osteoporosis  SVD (spontaneous vaginal delivery)    x 2   Varicose vein    Vitamin D deficiency    Past Surgical History:  Procedure Laterality Date   ANTERIOR APPROACH HEMI HIP ARTHROPLASTY Right 09/04/2020   Procedure: ANTERIOR APPROACH HEMI HIP ARTHROPLASTY;  Surgeon: Samson Frederic, MD;  Location: [facility];  Service: Orthopedics;  Laterality: Right;   APPENDECTOMY  1947   CHOLECYSTECTOMY  1988   COLONOSCOPY     GANGLION CYST EXCISION     x2 left and right arm   HYSTEROSCOPY WITH D & C N/A 07/15/2013   Procedure: DILATATION AND CURETTAGE /HYSTEROSCOPY;  Surgeon: Lenoard Aden, MD;  Location: [facility];  Service: Gynecology;  Laterality: N/A;   KYPHOPLASTY Bilateral 05/04/2020   Procedure: KYPHOPLASTY THORACIC TWELVE;  Surgeon: Bedelia Person, MD;  Location: [facility] OR;  Service: Neurosurgery;  Laterality: Bilateral;   MANDIBLE SURGERY  1954   WISDOM TOOTH EXTRACTION      Allergies  Allergen Reactions   Actonel [Risedronate] Other (See Comments)    "Allergic," per MAR   Alendronate Sodium Other (See Comments)    Leg cramps and "Allergic," per MAR   Bisphosphonates    Fosamax [Alendronate]    Remeron [Mirtazapine]     Risedronate Sodium Other (See Comments)    Caused the patient to be achy and is "Allergic," per MAR   Tramadol Other (See Comments)    Caused the patient to feel badly    Keflex [Cephalexin] Other (See Comments)    Possible rash    Allergies as of 08/01/2022       Reactions   Actonel [risedronate] Other (See Comments)   "Allergic," per MAR   Alendronate Sodium Other (See Comments)   Leg cramps and "Allergic," per MAR   Bisphosphonates    Fosamax [alendronate]    Remeron [mirtazapine]    Risedronate Sodium Other (See Comments)   Caused the patient to be achy and is "Allergic," per MAR   Tramadol Other (See Comments)   Caused the patient to feel badly    Keflex [cephalexin] Other (See Comments)   Possible rash        Medication List        Accurate as of August 01, 2022 11:59 PM. If you have any questions, ask your nurse or doctor.          acetaminophen 325 MG tablet Commonly known as: TYLENOL Take 650 mg by mouth in the morning and at bedtime.   ASPIRIN 81 PO Take 1 tablet by mouth daily.   Blue-Emu Super Strength Crea Apply 4 oz topically. Apply thin layer to the back.   CALCIUM-CARB 600 PO Take 1 tablet by mouth 2 (two) times daily.   cetirizine 10 MG tablet Commonly known as: ZyrTEC Allergy Take 1 tablet (10 mg total) by mouth daily.   divalproex 125 MG capsule Commonly known as: DEPAKOTE SPRINKLE Take 125 mg by mouth 2 (two) times daily.   docusate sodium 100 MG capsule Commonly known as: COLACE Take 100 mg by mouth at bedtime.   FLUoxetine 20 MG capsule Commonly known as: PROZAC Take 1 capsule (20 mg total) by mouth at bedtime.   gabapentin 100 MG capsule Commonly known as: NEURONTIN Take 1 capsule (100 mg total) by mouth 3 (three) times daily.   LORazepam 0.5 MG tablet Commonly known as: ATIVAN Take 0.5 mg by mouth daily. AS NEEDED   melatonin 5 MG Tabs Take 5 mg by mouth at bedtime.   Nizoral  A-D 1 % Sham Generic drug:  KETOCONAZOLE (TOPICAL) Apply 1 application. topically once a week. Monday   OLANZapine 7.5 MG tablet Commonly known as: ZYPREXA Take 7.5 mg by mouth at bedtime.   polyethylene glycol 17 g packet Commonly known as: MIRALAX / GLYCOLAX Take 17 g by mouth daily.   PRESERVISION AREDS 2 PO Take 1 capsule by mouth 2 (two) times daily.   senna-docusate 8.6-50 MG tablet Commonly known as: Senokot-S Take 2 tablets by mouth daily.   VITAMIN B12-FOLIC ACID PO Place 1 lozenge under the tongue in the morning.   Vitamin D3 50 MCG (2000 UT) capsule Take 1 capsule (2,000 Units total) by mouth daily.        Review of Systems  Constitutional:  Negative for activity change, fever and unexpected weight change.  HENT:  Positive for hearing loss. Negative for congestion and voice change.   Eyes:  Negative for visual disturbance.  Respiratory:  Negative for cough and shortness of breath.   Cardiovascular:  Negative for leg swelling.  Gastrointestinal:  Negative for abdominal pain and constipation.  Genitourinary:  Positive for frequency. Negative for difficulty urinating, dysuria and urgency.  Musculoskeletal:  Positive for arthralgias, back pain and gait problem.  Skin:  Negative for color change.  Neurological:  Negative for speech difficulty, weakness and headaches.       Dementia.   Psychiatric/Behavioral:  Positive for sleep disturbance. Negative for confusion and hallucinations. The patient is nervous/anxious.     Immunization History  Administered Date(s) Administered   DT (Pediatric) 04/10/2002   Fluad Quad(high Dose 65+) 10/28/2019   Influenza Split 03/08/2011, 11/17/2011   Influenza Whole 01/09/2007, 11/05/2008, 11/04/2009   Influenza, High Dose Seasonal PF 12/07/2015, 09/29/2016, 11/09/2017, 09/27/2018, 10/28/2019   Influenza,trivalent, recombinat, inj, PF 03/08/2011, 11/17/2011   Influenza-Unspecified 10/30/2012, 11/29/2013, 12/02/2014, 11/25/2020   Moderna Covid-19 Vaccine  Bivalent Booster 37yrs & up 12/09/2021   Moderna Sars-Covid-2 Vaccination 02/08/2019, 03/11/2019   PFIZER(Purple Top)SARS-COV-2 Vaccination 10/09/2019   PPD Test 12/22/2015, 01/05/2016   Pneumococcal Conjugate-13 07/17/2014   Pneumococcal Polysaccharide-23 08/15/2005, 09/25/2015   Tetanus 12/24/2012   Unspecified SARS-COV-2 Vaccination 02/11/2019, 12/17/2019, 10/27/2020   Zoster Recombinat (Shingrix) 06/07/2019, 09/06/2019   Zoster, Live 12/05/2013   Pertinent  Health Maintenance Due  Topic Date Due   INFLUENZA VACCINE  09/08/2022   DEXA SCAN  Completed      01/12/2021   10:01 PM 01/13/2021    8:14 AM 06/30/2021    3:06 PM 11/09/2021   11:37 AM 01/18/2022   10:20 AM  Fall Risk  Falls in the past year?   0 0 0  Was there an injury with Fall?   0 0 0  Fall Risk Category Calculator   0 0 0  Fall Risk Category (Retired)   Low Low Low  (RETIRED) Patient Fall Risk Level   High fall risk Low fall risk Low fall risk  Patient at Risk for Falls Due to   Impaired mobility;Impaired balance/gait;History of fall(s) No Fall Risks No Fall Risks  Fall risk Follow up    Falls evaluation completed Falls evaluation completed     Information is confidential and restricted. Go to Review Flowsheets to unlock data.   Functional Status Survey:    Vitals:   08/01/22 1455  BP: 114/66  Pulse: 68  Resp: 18  Temp: 97.8 F (36.6 C)  SpO2: 98%  Weight: 86 lb 9.6 oz (39.3 kg)  Height: 5\' 4"  (1.626 m)   Body mass index is  14.86 kg/m. Physical Exam Vitals and nursing note reviewed.  Constitutional:      Appearance: Normal appearance.  HENT:     Head: Normocephalic.     Nose: Nose normal.     Mouth/Throat:     Mouth: Mucous membranes are moist.  Eyes:     General:        Right eye: No discharge.        Left eye: No discharge.  Cardiovascular:     Rate and Rhythm: Normal rate.     Heart sounds: No murmur heard. Pulmonary:     Effort: Pulmonary effort is normal.     Breath sounds: No rales.   Abdominal:     General: Bowel sounds are normal. There is no distension.     Palpations: Abdomen is soft.     Tenderness: There is no abdominal tenderness. There is no right CVA tenderness, left CVA tenderness, guarding or rebound.  Musculoskeletal:     Cervical back: Normal range of motion and neck supple.     Right lower leg: No edema.     Left lower leg: No edema.     Comments: Right hip s/p ORIF 09/08/20, healed.   Skin:    General: Skin is warm and dry.  Neurological:     General: No focal deficit present.     Mental Status: She is alert and oriented to person, place, and time. Mental status is at baseline.     Motor: No weakness.     Coordination: Coordination normal.     Gait: Gait abnormal.     Comments: Ambulates with walker.   Psychiatric:        Mood and Affect: Mood normal.        Behavior: Behavior normal.     Comments: Confused, appears anxious, in hurry      Labs reviewed: No results for input(s): "NA", "K", "CL", "CO2", "GLUCOSE", "BUN", "CREATININE", "CALCIUM", "MG", "PHOS" in the last 8760 hours. No results for input(s): "AST", "ALT", "ALKPHOS", "BILITOT", "PROT", "ALBUMIN" in the last 8760 hours. No results for input(s): "WBC", "NEUTROABS", "HGB", "HCT", "MCV", "PLT" in the last 8760 hours. Lab Results  Component Value Date   TSH 2.25 06/30/2021   Lab Results  Component Value Date   HGBA1C 5.5 01/06/2021   Lab Results  Component Value Date   CHOL 182 01/06/2021   HDL 58 01/06/2021   LDLCALC 117 (H) 01/06/2021   LDLDIRECT 135.4 12/26/2008   TRIG 37 01/06/2021   CHOLHDL 3.1 01/06/2021    Significant Diagnostic Results in last 30 days:  No results found.  Assessment/Plan: Urinary frequency  reported urinary frequency, the patient stated its not new, denied dysuria, urinary urgency or incontinency, denied abd or lower back pain, afebrile, the patient denied UA C/S or medication to control urinary frequency for now. She denied the thought of having a  bladder infection. Observe.   Osteoporosis never received Evenity, taking Vit D, Ca. DEXA 10/06/21 T score -1.935,  may consider Prolia.   Blood loss anemia Hgb 13.1 07/21/22  HTN (hypertension) Blood pressure is controlled, off Amlodipine. Bun/creat 23/0.67 07/21/22  Slow transit constipation  takes Senokot S, MiraLax.   Senile dementia (HCC) TSH 2.25, Vit B12 >1504 06/30/21,  SNF [facility] for supportive care. MMSE 27/30 04/08/21  Vitamin B12 deficiency Vit B12 level >1504 06/30/21, takes Vit B12  Vitamin D deficiency takes Vit d daily, Vit D 52 07/21/22  Allergic rhinitis  takes Zyrtec.  MDD (major depressive disorder),  recurrent episode, severe (HCC) hx of suicidal attempt/ideations, anxious at times,  on Prozac, Zyprexa, Depakote. Mirtazapine made her AMS, TSH 1.93 07/21/22  Osteoarthritis, multiple sites hronic back pain, stable, T11, T12, L1 compression fx, s/p T12 Kyphoplasty, takes Tylenol, Gabapentin, ambulates with walker, followed by [facility].     Family/ staff Communication: plan of care reviewed with the patient and charge nurse.   Labs/tests ordered:  none  Time spend 35 minutes.

## 2022-08-02 ENCOUNTER — Encounter: Payer: Self-pay | Admitting: Nurse Practitioner

## 2022-08-02 DIAGNOSIS — R350 Frequency of micturition: Secondary | ICD-10-CM | POA: Insufficient documentation

## 2022-08-02 NOTE — Assessment & Plan Note (Addendum)
Blood pressure is controlled, off Amlodipine. Bun/creat 23/0.67 07/21/22

## 2022-08-02 NOTE — Assessment & Plan Note (Signed)
takes Senokot S, MiraLax 

## 2022-08-02 NOTE — Assessment & Plan Note (Signed)
hronic back pain, stable, T11, T12, L1 compression fx, s/p T12 Kyphoplasty, takes Tylenol, Gabapentin, ambulates with walker, followed by [facility].

## 2022-08-02 NOTE — Assessment & Plan Note (Signed)
takes Vit d daily, Vit D 52 07/21/22

## 2022-08-02 NOTE — Assessment & Plan Note (Signed)
hx of suicidal attempt/ideations, anxious at times,  on Prozac, Zyprexa, Depakote. Mirtazapine made her AMS, TSH 1.93 07/21/22

## 2022-08-02 NOTE — Assessment & Plan Note (Signed)
TSH 2.25, Vit B12 >1504 06/30/21,  SNF [facility] for supportive care. MMSE 27/30 04/08/21 

## 2022-08-02 NOTE — Assessment & Plan Note (Addendum)
Hgb 13.1 07/21/22

## 2022-08-02 NOTE — Assessment & Plan Note (Signed)
reported urinary frequency, the patient stated its not new, denied dysuria, urinary urgency or incontinency, denied abd or lower back pain, afebrile, the patient denied UA C/S or medication to control urinary frequency for now. She denied the thought of having a bladder infection. Observe.

## 2022-08-02 NOTE — Assessment & Plan Note (Signed)
takes Zyrtec. 

## 2022-08-02 NOTE — Assessment & Plan Note (Signed)
never received Evenity, taking Vit D, Ca. DEXA 10/06/21 T score -1.935,  may consider Prolia.  

## 2022-08-02 NOTE — Assessment & Plan Note (Signed)
Vit B12 level >1504 06/30/21, takes Vit B12 

## 2022-08-04 DIAGNOSIS — N3946 Mixed incontinence: Secondary | ICD-10-CM | POA: Diagnosis not present

## 2022-08-04 DIAGNOSIS — M6281 Muscle weakness (generalized): Secondary | ICD-10-CM | POA: Diagnosis not present

## 2022-08-11 DIAGNOSIS — R41841 Cognitive communication deficit: Secondary | ICD-10-CM | POA: Diagnosis not present

## 2022-08-11 DIAGNOSIS — R278 Other lack of coordination: Secondary | ICD-10-CM | POA: Diagnosis not present

## 2022-08-17 DIAGNOSIS — R41841 Cognitive communication deficit: Secondary | ICD-10-CM | POA: Diagnosis not present

## 2022-08-17 DIAGNOSIS — R278 Other lack of coordination: Secondary | ICD-10-CM | POA: Diagnosis not present

## 2022-08-18 DIAGNOSIS — R278 Other lack of coordination: Secondary | ICD-10-CM | POA: Diagnosis not present

## 2022-08-18 DIAGNOSIS — R41841 Cognitive communication deficit: Secondary | ICD-10-CM | POA: Diagnosis not present

## 2022-08-23 DIAGNOSIS — R278 Other lack of coordination: Secondary | ICD-10-CM | POA: Diagnosis not present

## 2022-08-23 DIAGNOSIS — R41841 Cognitive communication deficit: Secondary | ICD-10-CM | POA: Diagnosis not present

## 2022-08-29 DIAGNOSIS — R41841 Cognitive communication deficit: Secondary | ICD-10-CM | POA: Diagnosis not present

## 2022-08-29 DIAGNOSIS — R278 Other lack of coordination: Secondary | ICD-10-CM | POA: Diagnosis not present

## 2022-08-31 DIAGNOSIS — R278 Other lack of coordination: Secondary | ICD-10-CM | POA: Diagnosis not present

## 2022-08-31 DIAGNOSIS — R41841 Cognitive communication deficit: Secondary | ICD-10-CM | POA: Diagnosis not present

## 2022-09-01 DIAGNOSIS — F332 Major depressive disorder, recurrent severe without psychotic features: Secondary | ICD-10-CM | POA: Diagnosis not present

## 2022-09-01 DIAGNOSIS — G4700 Insomnia, unspecified: Secondary | ICD-10-CM | POA: Diagnosis not present

## 2022-09-01 DIAGNOSIS — F411 Generalized anxiety disorder: Secondary | ICD-10-CM | POA: Diagnosis not present

## 2022-09-01 DIAGNOSIS — F03A3 Unspecified dementia, mild, with mood disturbance: Secondary | ICD-10-CM | POA: Diagnosis not present

## 2022-09-02 ENCOUNTER — Encounter: Payer: Self-pay | Admitting: Nurse Practitioner

## 2022-09-02 ENCOUNTER — Non-Acute Institutional Stay (SKILLED_NURSING_FACILITY): Payer: Medicare Other | Admitting: Nurse Practitioner

## 2022-09-02 DIAGNOSIS — D696 Thrombocytopenia, unspecified: Secondary | ICD-10-CM

## 2022-09-02 DIAGNOSIS — E559 Vitamin D deficiency, unspecified: Secondary | ICD-10-CM | POA: Diagnosis not present

## 2022-09-02 DIAGNOSIS — E538 Deficiency of other specified B group vitamins: Secondary | ICD-10-CM | POA: Diagnosis not present

## 2022-09-02 DIAGNOSIS — K5901 Slow transit constipation: Secondary | ICD-10-CM | POA: Diagnosis not present

## 2022-09-02 DIAGNOSIS — I10 Essential (primary) hypertension: Secondary | ICD-10-CM | POA: Diagnosis not present

## 2022-09-02 DIAGNOSIS — F32A Depression, unspecified: Secondary | ICD-10-CM | POA: Diagnosis not present

## 2022-09-02 DIAGNOSIS — J3089 Other allergic rhinitis: Secondary | ICD-10-CM | POA: Diagnosis not present

## 2022-09-02 DIAGNOSIS — R41841 Cognitive communication deficit: Secondary | ICD-10-CM | POA: Diagnosis not present

## 2022-09-02 DIAGNOSIS — R278 Other lack of coordination: Secondary | ICD-10-CM | POA: Diagnosis not present

## 2022-09-02 DIAGNOSIS — F0390 Unspecified dementia without behavioral disturbance: Secondary | ICD-10-CM | POA: Diagnosis not present

## 2022-09-02 DIAGNOSIS — M545 Low back pain, unspecified: Secondary | ICD-10-CM

## 2022-09-02 DIAGNOSIS — R627 Adult failure to thrive: Secondary | ICD-10-CM | POA: Diagnosis not present

## 2022-09-02 DIAGNOSIS — E785 Hyperlipidemia, unspecified: Secondary | ICD-10-CM | POA: Diagnosis not present

## 2022-09-02 DIAGNOSIS — R45851 Suicidal ideations: Secondary | ICD-10-CM

## 2022-09-02 DIAGNOSIS — R350 Frequency of micturition: Secondary | ICD-10-CM

## 2022-09-02 DIAGNOSIS — D500 Iron deficiency anemia secondary to blood loss (chronic): Secondary | ICD-10-CM

## 2022-09-02 DIAGNOSIS — M810 Age-related osteoporosis without current pathological fracture: Secondary | ICD-10-CM

## 2022-09-02 NOTE — Assessment & Plan Note (Signed)
stable, T11, T12, L1 compression fx, s/p T12 Kyphoplasty, takes Tylenol, Gabapentin, ambulates with walker, followed by [facility].

## 2022-09-02 NOTE — Assessment & Plan Note (Signed)
takes Vit d daily, Vit D 52 07/21/22

## 2022-09-02 NOTE — Assessment & Plan Note (Signed)
Weight loss/adult failure to thrive, stable, low body weight,  didn't tolerate Mirtazapine.

## 2022-09-02 NOTE — Assessment & Plan Note (Signed)
Blood pressure is controlled, off Amlodipine. Bun/creat 23/0.67 07/21/22

## 2022-09-02 NOTE — Assessment & Plan Note (Signed)
Stable, no s/s of bleeding, plt 178 07/21/22

## 2022-09-02 NOTE — Assessment & Plan Note (Signed)
Vit B12 level >1504 06/30/21, takes Vit B12 

## 2022-09-02 NOTE — Assessment & Plan Note (Signed)
never received Evenity, taking Vit D, Ca. DEXA 10/06/21 T score -1.935,  may consider Prolia if HPOA/pt desires.

## 2022-09-02 NOTE — Assessment & Plan Note (Signed)
hx of suicidal attempt/ideations, anxious at times,  on Prozac, Zyprexa, Depakote. Mirtazapine made her AMS, TSH 1.93 07/21/22

## 2022-09-02 NOTE — Assessment & Plan Note (Signed)
Stable,  takes Senokot S, MiraLax. 

## 2022-09-02 NOTE — Assessment & Plan Note (Signed)
Stable,  takes Zyrtec. 

## 2022-09-02 NOTE — Progress Notes (Signed)
Location:   [facility] Room Number: 30-A Place of Service:  SNF (31) Provider:  Judson Tsan, NP   Patient Care Team: Jaidence Geisler X, NP as PCP - General (Internal Medicine)  Extended Emergency Contact Information Primary Emergency Contact: Lilley, Fleege Address: [address]          [place], [place] [address] [place] Home Phone: [phone] Relation: Spouse Secondary Emergency Contact: [facility] Phone: [phone] Relation: Daughter  Code Status: FULL CODE  Goals of care: Advanced Directive information    09/02/2022    9:10 AM  Advanced Directives  Does Patient Have a Medical Advance Directive? Yes  Type of Estate agent of [place];Living will  Does patient want to make changes to medical advance directive? No - Patient declined  Copy of Healthcare Power of Attorney in Chart? Yes - validated most recent copy scanned in chart (See row information)     Chief Complaint  Patient presents with   Medical Management of Chronic Issues    Routine Visit.    Immunizations    Discuss the need for [employer].     HPI:  Pt is a 82 y.o. female seen today for medical management of chronic diseases.    Urinary frequency, chronic, declined medication tx OP, never received Evenity, taking Vit D, Ca. DEXA 10/06/21 T score -1.935,  may consider Prolia if HPOA/pt desires.              Anemia, Iron 58, Hgb 13.1 07/21/22             HTN, off Amlodipine. Bun/creat 23/0.67 07/21/22             Constipation, takes Senokot S, MiraLax.              Dementia, TSH 2.25, Vit B12 >1504 06/30/21,  SNF [facility] for supportive care. MMSE 27/30 04/08/21             Vit B12 deficiency, Vit B12 level >1504 06/30/21, takes Vit B12             Vitamin D deficiency, takes Vit d daily, Vit D 52 07/21/22             Allergic rhinitis, takes Zyrtec.             Hyperlipidemia, diet, LDL 129 07/21/22             Depression, hx of suicidal  attempt/ideations, anxious at times,  on Prozac, Zyprexa, Depakote. Mirtazapine made her AMS, TSH 1.93 07/21/22             Chronic back pain, stable, T11, T12, L1 compression fx, s/p T12 Kyphoplasty, takes Tylenol, Gabapentin, ambulates with walker, followed by [facility].              Weight loss/adult failure to thrive, stable, low body weight,  didn't tolerate Mirtazapine.             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   Depression    years ago   Headache    sinus headaches   Lipoma of arm    Right   Medical history non-contributory    Osteoporosis    SVD (spontaneous vaginal delivery)    x 2   Varicose vein  Vitamin D deficiency    Past Surgical History:  Procedure Laterality Date   ANTERIOR APPROACH HEMI HIP ARTHROPLASTY Right 09/04/2020   Procedure: ANTERIOR APPROACH HEMI HIP ARTHROPLASTY;  Surgeon: Samson Frederic, MD;  Location: [facility];  Service: Orthopedics;  Laterality: Right;   APPENDECTOMY  1947   CHOLECYSTECTOMY  1988   COLONOSCOPY     GANGLION CYST EXCISION     x2 left and right arm   HYSTEROSCOPY WITH D & C N/A 07/15/2013   Procedure: DILATATION AND CURETTAGE /HYSTEROSCOPY;  Surgeon: Lenoard Aden, MD;  Location: [facility];  Service: Gynecology;  Laterality: N/A;   KYPHOPLASTY Bilateral 05/04/2020   Procedure: KYPHOPLASTY THORACIC TWELVE;  Surgeon: Bedelia Person, MD;  Location: [facility] OR;  Service: Neurosurgery;  Laterality: Bilateral;   MANDIBLE SURGERY  1954   WISDOM TOOTH EXTRACTION      Allergies  Allergen Reactions   Actonel [Risedronate] Other (See Comments)    "Allergic," per MAR   Alendronate Sodium Other (See Comments)    Leg cramps and "Allergic," per MAR   Bisphosphonates    Fosamax [Alendronate]    Remeron [Mirtazapine]    Risedronate Sodium Other (See Comments)    Caused the patient to be achy and is  "Allergic," per MAR   Tramadol Other (See Comments)    Caused the patient to feel badly    Keflex [Cephalexin] Other (See Comments)    Possible rash    Allergies as of 09/02/2022       Reactions   Actonel [risedronate] Other (See Comments)   "Allergic," per MAR   Alendronate Sodium Other (See Comments)   Leg cramps and "Allergic," per MAR   Bisphosphonates    Fosamax [alendronate]    Remeron [mirtazapine]    Risedronate Sodium Other (See Comments)   Caused the patient to be achy and is "Allergic," per MAR   Tramadol Other (See Comments)   Caused the patient to feel badly    Keflex [cephalexin] Other (See Comments)   Possible rash        Medication List        Accurate as of September 02, 2022 12:48 PM. If you have any questions, ask your nurse or doctor.          STOP taking these medications    Blue-Emu Super Strength Crea Stopped by: Charliene Inoue X Anum Palecek   FLUoxetine 20 MG capsule Commonly known as: PROZAC Stopped by: Jerney Baksh X Yona Kosek   LORazepam 0.5 MG tablet Commonly known as: ATIVAN Stopped by: Makenah Karas X Jazz Rogala       TAKE these medications    acetaminophen 325 MG tablet Commonly known as: TYLENOL Take 650 mg by mouth in the morning and at bedtime.   ASPIRIN 81 PO Take 1 tablet by mouth daily.   B-12 + FOLIC ACID PO Take 1 tablet by mouth as directed. Every 2 days   CALCIUM 600 PO Take 1 tablet by mouth in the morning and at bedtime.   cetirizine 10 MG tablet Commonly known as: ZyrTEC Allergy Take 1 tablet (10 mg total) by mouth daily.   divalproex 125 MG capsule Commonly known as: DEPAKOTE SPRINKLE Take 125 mg by mouth 2 (two) times daily.   docusate sodium 100 MG capsule Commonly known as: COLACE Take 100 mg by mouth at bedtime.   gabapentin 100 MG capsule Commonly known as: NEURONTIN Take 1 capsule (100 mg total) by mouth 3 (three) times daily.   melatonin 5 MG Tabs Take 5 mg  by mouth at bedtime.   Nizoral A-D 1 % Sham Generic drug: KETOCONAZOLE  (TOPICAL) Apply 1 application. topically once a week. Monday   OLANZapine 7.5 MG tablet Commonly known as: ZYPREXA Take 7.5 mg by mouth at bedtime.   polyethylene glycol 17 g packet Commonly known as: MIRALAX / GLYCOLAX Take 17 g by mouth daily.   PRESERVISION AREDS 2 PO Take 1 capsule by mouth 2 (two) times daily.   senna-docusate 8.6-50 MG tablet Commonly known as: Senokot-S Take 2 tablets by mouth daily.   sertraline 50 MG tablet Commonly known as: ZOLOFT Take 50 mg by mouth daily.   Vitamin D3 50 MCG (2000 UT) capsule Take 1 capsule (2,000 Units total) by mouth daily.        Review of Systems  Constitutional:  Negative for activity change, fever and unexpected weight change.  HENT:  Positive for hearing loss. Negative for congestion and voice change.   Eyes:  Negative for visual disturbance.  Respiratory:  Negative for cough and shortness of breath.   Cardiovascular:  Negative for leg swelling.  Gastrointestinal:  Negative for abdominal pain and constipation.  Genitourinary:  Positive for frequency. Negative for difficulty urinating, dysuria and urgency.  Musculoskeletal:  Positive for arthralgias, back pain and gait problem.  Skin:  Negative for color change.  Neurological:  Negative for speech difficulty, weakness and headaches.       Dementia.   Psychiatric/Behavioral:  Positive for sleep disturbance. Negative for confusion and hallucinations. The patient is nervous/anxious.     Immunization History  Administered Date(s) Administered   DT (Pediatric) 04/10/2002   Fluad Quad(high Dose 65+) 10/28/2019   Influenza Split 03/08/2011, 11/17/2011   Influenza Whole 01/09/2007, 11/05/2008, 11/04/2009   Influenza, High Dose Seasonal PF 12/07/2015, 09/29/2016, 11/09/2017, 09/27/2018, 10/28/2019   Influenza,trivalent, recombinat, inj, PF 03/08/2011, 11/17/2011   Influenza-Unspecified 10/30/2012, 11/29/2013, 12/02/2014, 11/25/2020   Moderna Covid-19 Vaccine Bivalent  Booster 38yrs & up 12/09/2021   Moderna Sars-Covid-2 Vaccination 02/08/2019, 03/11/2019   PFIZER(Purple Top)SARS-COV-2 Vaccination 10/09/2019   PPD Test 12/22/2015, 01/05/2016   Pneumococcal Conjugate-13 07/17/2014   Pneumococcal Polysaccharide-23 08/15/2005, 09/25/2015   Tetanus 12/24/2012   Unspecified SARS-COV-2 Vaccination 02/11/2019, 12/17/2019, 10/27/2020   Zoster Recombinant(Shingrix) 06/07/2019, 09/06/2019   Zoster, Live 12/05/2013   Pertinent  Health Maintenance Due  Topic Date Due   INFLUENZA VACCINE  09/08/2022   DEXA SCAN  Completed      01/12/2021   10:01 PM 01/13/2021    8:14 AM 06/30/2021    3:06 PM 11/09/2021   11:37 AM 01/18/2022   10:20 AM  Fall Risk  Falls in the past year?   0 0 0  Was there an injury with Fall?   0 0 0  Fall Risk Category Calculator   0 0 0  Fall Risk Category (Retired)   Low Low Low  (RETIRED) Patient Fall Risk Level   High fall risk Low fall risk Low fall risk  Patient at Risk for Falls Due to   Impaired mobility;Impaired balance/gait;History of fall(s) No Fall Risks No Fall Risks  Fall risk Follow up    Falls evaluation completed Falls evaluation completed     Information is confidential and restricted. Go to Review Flowsheets to unlock data.   Functional Status Survey:    Vitals:   09/02/22 0856  BP: 120/65  Pulse: 68  Resp: 17  Temp: (!) 95.1 F (35.1 C)  SpO2: 96%  Weight: 87 lb 4.8 oz (39.6 kg)  Height: 5\' 4"  (  1.626 m)   Body mass index is 14.99 kg/m. Physical Exam Vitals and nursing note reviewed.  Constitutional:      Appearance: Normal appearance.  HENT:     Head: Normocephalic.     Nose: Nose normal.     Mouth/Throat:     Mouth: Mucous membranes are moist.  Eyes:     General:        Right eye: No discharge.        Left eye: No discharge.  Cardiovascular:     Rate and Rhythm: Normal rate.     Heart sounds: No murmur heard. Pulmonary:     Effort: Pulmonary effort is normal.     Breath sounds: No rales.   Abdominal:     General: Bowel sounds are normal.     Palpations: Abdomen is soft.     Tenderness: There is no abdominal tenderness.  Musculoskeletal:     Cervical back: Normal range of motion and neck supple.     Right lower leg: No edema.     Left lower leg: No edema.     Comments: Right hip s/p ORIF 09/08/20, healed.   Skin:    General: Skin is warm and dry.  Neurological:     General: No focal deficit present.     Mental Status: She is alert and oriented to person, place, and time. Mental status is at baseline.     Motor: No weakness.     Coordination: Coordination normal.     Gait: Gait abnormal.     Comments: Ambulates with walker.   Psychiatric:        Mood and Affect: Mood normal.        Behavior: Behavior normal.     Comments: Confused, appears anxious, in hurry, at her baseline     Labs reviewed: Recent Labs    05/17/22 0000 07/21/22 0000  NA 138 141  K 4.4 3.6  CL 101 100  CO2 31* 32*  BUN 19 23*  CREATININE 0.7 0.7  CALCIUM 9.1 9.3   Recent Labs    05/17/22 0000 07/21/22 0000  AST 19 19  ALT 15 15  ALKPHOS 46 53  ALBUMIN 3.7 4.0   Recent Labs    05/17/22 0000 07/21/22 0000  WBC 5.1 5.3  NEUTROABS 2,627.00 2,889.00  HGB 12.6 13.1  HCT 39 41  PLT 173 178   Lab Results  Component Value Date   TSH 1.93 07/21/2022   Lab Results  Component Value Date   HGBA1C 5.5 01/06/2021   Lab Results  Component Value Date   CHOL 221 (A) 07/21/2022   HDL 76 (A) 07/21/2022   LDLCALC 129 07/21/2022   LDLDIRECT 135.4 12/26/2008   TRIG 70 07/21/2022   CHOLHDL 3.1 01/06/2021    Significant Diagnostic Results in last 30 days:  No results found.  Assessment/Plan Blood loss anemia  Iron 58, Hgb 13.1 07/21/22  HTN (hypertension) Blood pressure is controlled, off Amlodipine. Bun/creat 23/0.67 07/21/22  Slow transit constipation Stable,  takes Senokot S, MiraLax.   Senile dementia (HCC) No behavioral issues, high functional in SNF [facility], TSH 2.25, Vit  B12 >1504 06/30/21,  SNF [facility] for supportive care. MMSE 27/30 04/08/21  Vitamin B12 deficiency Vit B12 level >1504 06/30/21, takes Vit B12  Vitamin D deficiency  takes Vit d daily, Vit D 52 07/21/22  Allergic rhinitis Stable,  takes Zyrtec.  Hyperlipidemia Desires diet control, LDL 129 07/21/22  Depression with suicidal ideation hx of suicidal attempt/ideations,  anxious at times,  on Prozac, Zyprexa, Depakote. Mirtazapine made her AMS, TSH 1.93 07/21/22  Low back pain  stable, T11, T12, L1 compression fx, s/p T12 Kyphoplasty, takes Tylenol, Gabapentin, ambulates with walker, followed by [facility].   Failure to thrive in adult Weight loss/adult failure to thrive, stable, low body weight,  didn't tolerate Mirtazapine  Thrombocytopenia (HCC) Stable, no s/s of bleeding, plt 178 07/21/22  Osteoporosis never received Evenity, taking Vit D, Ca. DEXA 10/06/21 T score -1.935,  may consider Prolia if HPOA/pt desires.      Family/ staff Communication: plan of care reviewed with the patient and charge nurse.   Labs/tests ordered:  none  Time spend 35 minutes.

## 2022-09-02 NOTE — Assessment & Plan Note (Signed)
Desires diet control, LDL 129 07/21/22

## 2022-09-02 NOTE — Assessment & Plan Note (Signed)
chronic, declined medication tx

## 2022-09-02 NOTE — Assessment & Plan Note (Signed)
Iron 58, Hgb 13.1 07/21/22

## 2022-09-02 NOTE — Assessment & Plan Note (Signed)
No behavioral issues, high functional in SNF [facility], TSH 2.25, Vit B12 >1504 06/30/21,  SNF [facility] for supportive care. MMSE 27/30 04/08/21

## 2022-09-06 DIAGNOSIS — R41841 Cognitive communication deficit: Secondary | ICD-10-CM | POA: Diagnosis not present

## 2022-09-06 DIAGNOSIS — R278 Other lack of coordination: Secondary | ICD-10-CM | POA: Diagnosis not present

## 2022-09-07 ENCOUNTER — Encounter: Payer: Self-pay | Admitting: Adult Health

## 2022-09-07 ENCOUNTER — Non-Acute Institutional Stay: Payer: Self-pay | Admitting: Adult Health

## 2022-09-07 DIAGNOSIS — U071 COVID-19: Secondary | ICD-10-CM | POA: Diagnosis not present

## 2022-09-07 MED ORDER — NIRMATRELVIR/RITONAVIR (PAXLOVID)TABLET
3.0000 | ORAL_TABLET | Freq: Two times a day (BID) | ORAL | 0 refills | Status: AC
Start: 2022-09-07 — End: 2022-09-12

## 2022-09-07 NOTE — Progress Notes (Signed)
Location:  [facility] Room Number: 30 A Place of Service:  SNF (31) Provider:  Kenard Gower, DNP, FNP-BC  Patient Care Team: Mast, Man X, NP as PCP - General (Internal Medicine)  Extended Emergency Contact Information Primary Emergency Contact: Symiah, [place] Address: [address]          [place], [place] [address] [place] Home Phone: [phone] Relation: Spouse Secondary Emergency Contact: [facility] Phone: [phone] Relation: Daughter  Code Status:   Full Code  Goals of care: Advanced Directive information    09/07/2022    1:49 PM  Advanced Directives  Does Patient Have a Medical Advance Directive? Yes  Type of Estate agent of [place];Living will  Does patient want to make changes to medical advance directive? No - Patient declined  Copy of Healthcare Power of Attorney in Chart? Yes - validated most recent copy scanned in chart (See row information)     Chief Complaint  Patient presents with   Acute Visit    Positive COVID    HPI:  Pt is a 82 y.o. female seen today for an acute visit due to testing positive for COVID-19. She is a resident of [facility]. She was tested for COVID-19 due to her husband testing positive for COVID-19. She denies cough, shortness of breath, chills nor sore throat.    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   Depression    years ago   Headache    sinus headaches   Lipoma of arm    Right   Medical history non-contributory    Osteoporosis    SVD (spontaneous vaginal delivery)    x 2   Varicose vein    Vitamin D deficiency    Past Surgical History:  Procedure Laterality Date   ANTERIOR APPROACH HEMI HIP ARTHROPLASTY Right 09/04/2020   Procedure: ANTERIOR APPROACH HEMI HIP ARTHROPLASTY;  Surgeon: Samson Frederic, MD;  Location: [facility];  Service: Orthopedics;   Laterality: Right;   APPENDECTOMY  1947   CHOLECYSTECTOMY  1988   COLONOSCOPY     GANGLION CYST EXCISION     x2 left and right arm   HYSTEROSCOPY WITH D & C N/A 07/15/2013   Procedure: DILATATION AND CURETTAGE /HYSTEROSCOPY;  Surgeon: Lenoard Aden, MD;  Location: [facility];  Service: Gynecology;  Laterality: N/A;   KYPHOPLASTY Bilateral 05/04/2020   Procedure: KYPHOPLASTY THORACIC TWELVE;  Surgeon: Bedelia Person, MD;  Location: [facility] OR;  Service: Neurosurgery;  Laterality: Bilateral;   MANDIBLE SURGERY  1954   WISDOM TOOTH EXTRACTION      Allergies  Allergen Reactions   Actonel [Risedronate] Other (See Comments)    "Allergic," per MAR   Alendronate Sodium Other (See Comments)    Leg cramps and "Allergic," per MAR   Bisphosphonates    Fosamax [Alendronate]    Remeron [Mirtazapine]    Risedronate Sodium Other (See Comments)    Caused the patient to be achy and is "Allergic," per MAR   Tramadol Other (See Comments)    Caused the patient to feel badly    Keflex [Cephalexin] Other (See Comments)    Possible rash    Outpatient Encounter Medications as of 09/07/2022  Medication Sig   acetaminophen (TYLENOL) 325 MG tablet Take 650 mg by mouth in the morning and at bedtime.   ASPIRIN  81 PO Take 1 tablet by mouth daily.   Calcium Carbonate (CALCIUM 600 PO) Take 1 tablet by mouth in the morning and at bedtime.   cetirizine (ZYRTEC ALLERGY) 10 MG tablet Take 1 tablet (10 mg total) by mouth daily.   Cholecalciferol (VITAMIN D3) 2000 units capsule Take 1 capsule (2,000 Units total) by mouth daily.   Cobalamin Combinations (B-12 + FOLIC ACID PO) Take 1 tablet by mouth as directed. Every 2 days   divalproex (DEPAKOTE SPRINKLE) 125 MG capsule Take 125 mg by mouth 2 (two) times daily.   docusate sodium (COLACE) 100 MG capsule Take 100 mg by mouth at bedtime.   gabapentin (NEURONTIN) 100 MG capsule Take 1 capsule (100 mg total) by mouth 3 (three) times daily.   KETOCONAZOLE, TOPICAL, (NIZORAL  A-D) 1 % SHAM Apply 1 application. topically once a week. Monday   melatonin 5 MG TABS Take 5 mg by mouth at bedtime.   Multiple Vitamins-Minerals (PRESERVISION AREDS 2 PO) Take 1 capsule by mouth 2 (two) times daily.   nirmatrelvir/ritonavir (PAXLOVID) 20 x 150 MG & 10 x 100MG  TABS Take 3 tablets by mouth 2 (two) times daily for 5 days. (Take nirmatrelvir 150 mg two tablets twice daily for 5 days and ritonavir 100 mg one tablet twice daily for 5 days) Patient GFR is 87, 07/21/22   OLANZapine (ZYPREXA) 7.5 MG tablet Take 7.5 mg by mouth at bedtime.   polyethylene glycol (MIRALAX / GLYCOLAX) 17 g packet Take 17 g by mouth daily.   senna-docusate (SENOKOT-S) 8.6-50 MG tablet Take 2 tablets by mouth daily.   sertraline (ZOLOFT) 50 MG tablet Take 50 mg by mouth daily.   No facility-administered encounter medications on file as of 09/07/2022.    Review of Systems  Constitutional:  Negative for appetite change, chills, fatigue and fever.  HENT:  Negative for congestion, hearing loss, rhinorrhea and sore throat.   Eyes: Negative.   Respiratory:  Negative for cough, shortness of breath and wheezing.   Cardiovascular:  Negative for chest pain, palpitations and leg swelling.  Gastrointestinal:  Negative for abdominal pain, constipation, diarrhea, nausea and vomiting.  Genitourinary:  Negative for dysuria.  Musculoskeletal:  Negative for arthralgias, back pain and myalgias.  Skin:  Negative for color change, rash and wound.  Neurological:  Negative for dizziness, weakness and headaches.  Psychiatric/Behavioral:  Negative for behavioral problems. The patient is not nervous/anxious.       Immunization History  Administered Date(s) Administered   DT (Pediatric) 04/10/2002   Fluad Quad(high Dose 65+) 10/28/2019   Influenza Split 03/08/2011, 11/17/2011   Influenza Whole 01/09/2007, 11/05/2008, 11/04/2009   Influenza, High Dose Seasonal PF 12/07/2015, 09/29/2016, 11/09/2017, 09/27/2018, 10/28/2019    Influenza,trivalent, recombinat, inj, PF 03/08/2011, 11/17/2011   Influenza-Unspecified 10/30/2012, 11/29/2013, 12/02/2014, 11/25/2020   Moderna Covid-19 Vaccine Bivalent Booster 40yrs & up 12/09/2021   Moderna Sars-Covid-2 Vaccination 02/08/2019, 03/11/2019   PFIZER(Purple Top)SARS-COV-2 Vaccination 10/09/2019   PPD Test 12/22/2015, 01/05/2016   Pneumococcal Conjugate-13 07/17/2014   Pneumococcal Polysaccharide-23 08/15/2005, 09/25/2015   Tetanus 12/24/2012   Unspecified SARS-COV-2 Vaccination 02/11/2019, 12/17/2019, 10/27/2020   Zoster Recombinant(Shingrix) 06/07/2019, 09/06/2019   Zoster, Live 12/05/2013   Pertinent  Health Maintenance Due  Topic Date Due   INFLUENZA VACCINE  09/08/2022   DEXA SCAN  Completed      01/12/2021   10:01 PM 01/13/2021    8:14 AM 06/30/2021    3:06 PM 11/09/2021   11:37 AM 01/18/2022   10:20 AM  Fall Risk  Falls in the past year?   0 0 0  Was there an injury with Fall?   0 0 0  Fall Risk Category Calculator   0 0 0  Fall Risk Category (Retired)   Low Low Low  (RETIRED) Patient Fall Risk Level   High fall risk Low fall risk Low fall risk  Patient at Risk for Falls Due to   Impaired mobility;Impaired balance/gait;History of fall(s) No Fall Risks No Fall Risks  Fall risk Follow up    Falls evaluation completed Falls evaluation completed     Information is confidential and restricted. Go to Review Flowsheets to unlock data.     Vitals:   09/07/22 1334  BP: 105/62  Pulse: 85  Resp: 17  Temp: (!) 95.1 F (35.1 C)  SpO2: 96%  Weight: 87 lb 4.8 oz (39.6 kg)  Height: 5\' 4"  (1.626 m)   Body mass index is 14.99 kg/m.  Physical Exam HENT:     Head: Normocephalic and atraumatic.     Nose: Nose normal.     Mouth/Throat:     Mouth: Mucous membranes are moist.  Eyes:     Conjunctiva/sclera: Conjunctivae normal.  Cardiovascular:     Rate and Rhythm: Normal rate and regular rhythm.  Pulmonary:     Effort: Pulmonary effort is normal.     Breath  sounds: Normal breath sounds.  Abdominal:     General: Bowel sounds are normal.     Palpations: Abdomen is soft.  Musculoskeletal:        General: Normal range of motion.     Cervical back: Normal range of motion.  Skin:    General: Skin is warm and dry.  Neurological:     General: No focal deficit present.     Mental Status: She is alert and oriented to person, place, and time.  Psychiatric:        Mood and Affect: Mood normal.        Behavior: Behavior normal.        Thought Content: Thought content normal.        Judgment: Judgment normal.       Labs reviewed: Recent Labs    05/17/22 0000 07/21/22 0000  NA 138 141  K 4.4 3.6  CL 101 100  CO2 31* 32*  BUN 19 23*  CREATININE 0.7 0.7  CALCIUM 9.1 9.3   Recent Labs    05/17/22 0000 07/21/22 0000  AST 19 19  ALT 15 15  ALKPHOS 46 53  ALBUMIN 3.7 4.0   Recent Labs    05/17/22 0000 07/21/22 0000  WBC 5.1 5.3  NEUTROABS 2,627.00 2,889.00  HGB 12.6 13.1  HCT 39 41  PLT 173 178   Lab Results  Component Value Date   TSH 1.93 07/21/2022   Lab Results  Component Value Date   HGBA1C 5.5 01/06/2021   Lab Results  Component Value Date   CHOL 221 (A) 07/21/2022   HDL 76 (A) 07/21/2022   LDLCALC 129 07/21/2022   LDLDIRECT 135.4 12/26/2008   TRIG 70 07/21/2022   CHOLHDL 3.1 01/06/2021    Significant Diagnostic Results in last 30 days:  No results found.  Assessment/Plan  1. COVID-19 - nirmatrelvir/ritonavir (PAXLOVID) 20 x 150 MG & 10 x 100MG  TABS; Take 3 tablets by mouth 2 (two) times daily for 5 days. (Take nirmatrelvir 150 mg two tablets twice daily for 5 days and ritonavir 100 mg one tablet twice daily for 5 days) Patient GFR  is 49, 07/21/22  Dispense: 30 tablet; Refill: 0 -  will need to be on droplet precautions X 10 days    Family/ staff Communication: Discussed plan of care with resident and charge nurse.  Labs/tests ordered:  None    Kenard Gower, DNP, MSN, FNP-BC [facility] [phone] (Monday-Friday 8:00 a.m. - 5:00 p.m.) [phone] (after hours)

## 2022-09-08 DIAGNOSIS — R41841 Cognitive communication deficit: Secondary | ICD-10-CM | POA: Diagnosis not present

## 2022-09-08 DIAGNOSIS — R278 Other lack of coordination: Secondary | ICD-10-CM | POA: Diagnosis not present

## 2022-09-16 ENCOUNTER — Encounter: Payer: Self-pay | Admitting: Family Medicine

## 2022-09-19 ENCOUNTER — Encounter: Payer: Self-pay | Admitting: Family Medicine

## 2022-09-19 DIAGNOSIS — R41841 Cognitive communication deficit: Secondary | ICD-10-CM | POA: Diagnosis not present

## 2022-09-19 DIAGNOSIS — R278 Other lack of coordination: Secondary | ICD-10-CM | POA: Diagnosis not present

## 2022-09-20 ENCOUNTER — Non-Acute Institutional Stay (SKILLED_NURSING_FACILITY): Payer: Medicare Other | Admitting: Family Medicine

## 2022-09-20 DIAGNOSIS — F32A Depression, unspecified: Secondary | ICD-10-CM

## 2022-09-20 DIAGNOSIS — R45851 Suicidal ideations: Secondary | ICD-10-CM | POA: Diagnosis not present

## 2022-09-20 DIAGNOSIS — M810 Age-related osteoporosis without current pathological fracture: Secondary | ICD-10-CM | POA: Diagnosis not present

## 2022-09-20 DIAGNOSIS — M545 Low back pain, unspecified: Secondary | ICD-10-CM

## 2022-09-20 DIAGNOSIS — I10 Essential (primary) hypertension: Secondary | ICD-10-CM

## 2022-09-20 DIAGNOSIS — F4323 Adjustment disorder with mixed anxiety and depressed mood: Secondary | ICD-10-CM

## 2022-09-20 DIAGNOSIS — R443 Hallucinations, unspecified: Secondary | ICD-10-CM

## 2022-09-20 NOTE — Progress Notes (Signed)
Provider:  Jacalyn Lefevre, MD Location:      Place of Service:     PCP: Mast, Man X, NP Patient Care Team: Mast, Man X, NP as PCP - General (Internal Medicine)  Extended Emergency Contact Information Primary Emergency Contact: Quinlyn, Delao Address: [address]          [place], [place] [address] [place] Home Phone: [phone] Relation: Spouse Secondary Emergency Contact: [facility] Phone: [phone] Relation: Daughter  Code Status:  Goals of Care: Advanced Directive information    09/07/2022    1:49 PM  Advanced Directives  Does Patient Have a Medical Advance Directive? Yes  Type of Estate agent of [place];Living will  Does patient want to make changes to medical advance directive? No - Patient declined  Copy of Healthcare Power of Attorney in Chart? Yes - validated most recent copy scanned in chart (See row information)      No chief complaint on file.   HPI: Patient is a 82 y.o. female seen today for medical management of chronic problems including osteoporosis, hypertension, depression, chronic back pain, and adult failure to thrive. Since last visit there have been no new issues.  Patient endorses sleeping well good appetite.  She is a very small stature woman.  She did not tolerate mirtazapine for weight loss. There is a history of depression and anxiety.  She is on Prozac Zyprexa Depakote. She has chronic back pain with compression fractures and history of kyphoplasty.  She ambulates well using walker and is followed by neurosurgery.  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   Depression    years ago   Headache    sinus headaches   Lipoma of arm    Right   Medical history non-contributory    Osteoporosis    SVD (spontaneous vaginal delivery)    x 2   Varicose vein    Vitamin D deficiency    Past Surgical History:  Procedure Laterality  Date   ANTERIOR APPROACH HEMI HIP ARTHROPLASTY Right 09/04/2020   Procedure: ANTERIOR APPROACH HEMI HIP ARTHROPLASTY;  Surgeon: Samson Frederic, MD;  Location: [facility];  Service: Orthopedics;  Laterality: Right;   APPENDECTOMY  1947   CHOLECYSTECTOMY  1988   COLONOSCOPY     GANGLION CYST EXCISION     x2 left and right arm   HYSTEROSCOPY WITH D & C N/A 07/15/2013   Procedure: DILATATION AND CURETTAGE /HYSTEROSCOPY;  Surgeon: Lenoard Aden, MD;  Location: [facility];  Service: Gynecology;  Laterality: N/A;   KYPHOPLASTY Bilateral 05/04/2020   Procedure: KYPHOPLASTY THORACIC TWELVE;  Surgeon: Bedelia Person, MD;  Location: [facility];  Service: Neurosurgery;  Laterality: Bilateral;   MANDIBLE SURGERY  1954   WISDOM TOOTH EXTRACTION      reports that she has never smoked. She has never used smokeless tobacco. She reports that she does not drink alcohol and does not use drugs. Social History   Socioeconomic History   Marital status: Married    Spouse name: Not on file   Number of children: 1   Years of education: Not on file   Highest education level: Not on file  Occupational History   Occupation: retired  Tobacco Use   Smoking status: Never   Smokeless tobacco: Never  Vaping Use   Vaping status: Never Used  Substance and Sexual Activity  Alcohol use: No   Drug use: No   Sexual activity: Yes    Birth control/protection: Post-menopausal  Other Topics Concern   Not on file  Social History Narrative   GI in [place] for colonoscopy   Dr Billy Coast - GYN      Denies Surgical history      Family history of varicose veins   Mother is 41      Regular exercise - NO   Social Determinants of Health   Financial Resource Strain: Low Risk  (10/10/2019)   Overall Financial Resource Strain (CARDIA)    Difficulty of Paying Living Expenses: Not hard at all  Food Insecurity: No Food Insecurity (07/28/2020)   Received from [facility], [facility]   Hunger Vital Sign    Worried About Running Out  of Food in the Last Year: Never true    Ran Out of Food in the Last Year: Never true  Transportation Needs: No Transportation Needs (10/10/2019)   PRAPARE - Administrator, Civil Service (Medical): No    Lack of Transportation (Non-Medical): No  Physical Activity: Sufficiently Active (10/10/2019)   Exercise Vital Sign    Days of Exercise per Week: 5 days    Minutes of Exercise per Session: 30 min  Stress: Stress Concern Present (10/10/2019)   [employer] of Occupational Health - Occupational Stress Questionnaire    Feeling of Stress : Rather much  Social Connections: Unknown (06/22/2021)   Received from [facility], [facility]   Social Network    Social Network: Not on file  Intimate Partner Violence: Unknown (05/14/2021)   [facility], [facility]   HITS    Physically Hurt: Not on file    Insult or Talk Down To: Not on file    Threaten Physical Harm: Not on file    Scream or Curse: Not on file    Functional Status Survey:    Family History  Problem Relation Age of Onset   Hypertension Mother    Heart disease Father    Cancer Father     Health Maintenance  Topic Date Due   COVID-19 Vaccine (8 - 2023-24 season) 02/03/2022   INFLUENZA VACCINE  09/08/2022   DTaP/Tdap/Td (3 - Tdap) 12/25/2022   Medicare Annual Wellness (AWV)  03/01/2023   Pneumonia Vaccine 10+ Years old  Completed   DEXA SCAN  Completed   Zoster Vaccines- Shingrix  Completed   HPV VACCINES  Aged Out    Allergies  Allergen Reactions   Actonel [Risedronate] Other (See Comments)    "Allergic," per MAR   Alendronate Sodium Other (See Comments)    Leg cramps and "Allergic," per MAR   Bisphosphonates    Fosamax [Alendronate]    Remeron [Mirtazapine]    Risedronate Sodium Other (See Comments)    Caused the patient to be achy and is "Allergic," per MAR   Tramadol Other (See Comments)    Caused the patient to feel badly    Keflex [Cephalexin] Other (See Comments)     Possible rash    Outpatient Encounter Medications as of 09/20/2022  Medication Sig   acetaminophen (TYLENOL) 325 MG tablet Take 650 mg by mouth in the morning and at bedtime.   ASPIRIN 81 PO Take 1 tablet by mouth daily.   Calcium Carbonate (CALCIUM 600 PO) Take 1 tablet by mouth in the morning and at bedtime.   cetirizine (ZYRTEC ALLERGY) 10 MG tablet Take 1 tablet (10 mg total) by mouth  daily.   Cholecalciferol (VITAMIN D3) 2000 units capsule Take 1 capsule (2,000 Units total) by mouth daily.   Cobalamin Combinations (B-12 + FOLIC ACID PO) Take 1 tablet by mouth as directed. Every 2 days   divalproex (DEPAKOTE SPRINKLE) 125 MG capsule Take 125 mg by mouth 2 (two) times daily.   docusate sodium (COLACE) 100 MG capsule Take 100 mg by mouth at bedtime.   gabapentin (NEURONTIN) 100 MG capsule Take 1 capsule (100 mg total) by mouth 3 (three) times daily.   KETOCONAZOLE, TOPICAL, (NIZORAL A-D) 1 % SHAM Apply 1 application. topically once a week. Monday   melatonin 5 MG TABS Take 5 mg by mouth at bedtime.   Multiple Vitamins-Minerals (PRESERVISION AREDS 2 PO) Take 1 capsule by mouth 2 (two) times daily.   OLANZapine (ZYPREXA) 7.5 MG tablet Take 7.5 mg by mouth at bedtime.   polyethylene glycol (MIRALAX / GLYCOLAX) 17 g packet Take 17 g by mouth daily.   senna-docusate (SENOKOT-S) 8.6-50 MG tablet Take 2 tablets by mouth daily.   sertraline (ZOLOFT) 50 MG tablet Take 50 mg by mouth daily.   No facility-administered encounter medications on file as of 09/20/2022.    Review of Systems  Constitutional: Negative.   HENT: Negative.    Respiratory: Negative.    Cardiovascular: Negative.   Gastrointestinal: Negative.   Musculoskeletal:  Positive for back pain.  Neurological: Negative.   Psychiatric/Behavioral:  The patient is nervous/anxious.   All other systems reviewed and are negative.   There were no vitals filed for this visit. There is no height or weight on file to calculate  BMI. Physical Exam Vitals and nursing note reviewed.  Constitutional:      Appearance: Normal appearance.  HENT:     Mouth/Throat:     Mouth: Mucous membranes are moist.     Pharynx: Oropharynx is clear.  Eyes:     Extraocular Movements: Extraocular movements intact.     Pupils: Pupils are equal, round, and reactive to light.  Cardiovascular:     Rate and Rhythm: Normal rate and regular rhythm.  Pulmonary:     Effort: Pulmonary effort is normal.  Abdominal:     General: Bowel sounds are normal.     Palpations: Abdomen is soft.  Neurological:     General: No focal deficit present.     Mental Status: She is alert and oriented to person, place, and time.  Psychiatric:        Mood and Affect: Mood normal.        Behavior: Behavior normal.     Labs reviewed: Basic Metabolic Panel: Recent Labs    05/17/22 0000 07/21/22 0000  NA 138 141  K 4.4 3.6  CL 101 100  CO2 31* 32*  BUN 19 23*  CREATININE 0.7 0.7  CALCIUM 9.1 9.3   Liver Function Tests: Recent Labs    05/17/22 0000 07/21/22 0000  AST 19 19  ALT 15 15  ALKPHOS 46 53  ALBUMIN 3.7 4.0   No results for input(s): "LIPASE", "AMYLASE" in the last 8760 hours. No results for input(s): "AMMONIA" in the last 8760 hours. CBC: Recent Labs    05/17/22 0000 07/21/22 0000  WBC 5.1 5.3  NEUTROABS 2,627.00 2,889.00  HGB 12.6 13.1  HCT 39 41  PLT 173 178   Cardiac Enzymes: No results for input(s): "CKTOTAL", "CKMB", "CKMBINDEX", "TROPONINI" in the last 8760 hours. BNP: Invalid input(s): "POCBNP" Lab Results  Component Value Date   HGBA1C 5.5 01/06/2021  Lab Results  Component Value Date   TSH 1.93 07/21/2022   Lab Results  Component Value Date   VITAMINB12 1,892 12/10/2021   No results found for: "FOLATE" Lab Results  Component Value Date   IRON 58 06/30/2021   TIBC 313 06/30/2021   FERRITIN 72 06/30/2021    Imaging and Procedures obtained prior to SNF admission: MR LUMBAR SPINE WO  CONTRAST  Result Date: 05/23/2022 CLINICAL DATA:  Chronic back pain, osteoporosis EXAM: MRI LUMBAR SPINE WITHOUT CONTRAST TECHNIQUE: Multiplanar, multisequence MR imaging of the lumbar spine was performed. No intravenous contrast was administered. COMPARISON:  06/02/2020 FINDINGS: Segmentation:  Standard. Alignment:  3 mm retrolisthesis of L3 on L4. Vertebrae: No acute fracture, evidence of discitis, or aggressive bone lesion. Chronic T11 vertebral body compression fracture with approximately 60% anterior height loss and minimal residual marrow edema along the anterior-most aspect which may be related to disc disease. Chronic T12 vertebral body compression fracture with evidence of prior augmentation. Chronic L1 vertebral body compression fracture with approximately 60% height loss. Conus medullaris and cauda equina: Conus extends to the L1-2 level. Conus and cauda equina appear normal. Paraspinal and other soft tissues: No acute paraspinal abnormality. Disc levels: Disc spaces: Degenerative disease with disc height loss at L2-3, L3-4, and L5-S1. disc desiccation at L4-5 and L1-2. T11-12: Mild broad-based disc bulge. Mild bilateral facet arthropathy. Mild right foraminal stenosis. No left foraminal stenosis. No spinal stenosis. T12-L1: No disc protrusion. Mild bilateral facet arthropathy. No foraminal stenosis. L1-L2: Minimal broad-based disc bulge. Mild broad-based scratch them mild bilateral facet arthropathy. Mild spinal stenosis. No foraminal stenosis. L2-L3: Broad-based disc bulge. Mild bilateral facet arthropathy. Mild spinal stenosis. No foraminal stenosis. L3-L4: Broad-based disc bulge. Moderate bilateral facet arthropathy with a small right facet effusion. Bilateral subarticular recess stenosis. Mild spinal stenosis. Mild bilateral foraminal stenosis. L4-L5: Mild broad-based disc bulge. Mild bilateral facet arthropathy. Bilateral lateral recess stenosis. Mild spinal stenosis. No foraminal stenosis. L5-S1:  Mild broad-based disc bulge. Mild bilateral facet arthropathy. No significant foraminal stenosis. No spinal stenosis. IMPRESSION: 1. No acute osseous injury of the lumbar spine. 2. Lumbar spine spondylosis as described above. 3. Chronic T11 vertebral body compression fracture with approximately 60% anterior height loss and minimal residual marrow edema along the anterior-most aspect which may be related to disc disease. Chronic T12 vertebral body compression fracture with evidence of prior augmentation. Chronic L1 vertebral body compression fracture with approximately 60% height loss. Electronically Signed   By: Elige Ko M.D.   On: 05/23/2022 12:31    Assessment/Plan 1. Adjustment disorder with mixed anxiety and depressed mood Symptoms are managed with combination Depakote sertraline, Zyprexa  2. Depression with suicidal ideation See above    4. Hypertension, unspecified type Patient is off antihypertensive medicines  5. Acute bilateral low back pain without sciatica Needs occasional tramadol.  Also uses gabapentin regularly  6. Age-related osteoporosis without current pathological fracture Patient is not on bisphosphonate.  On calcium supplement    Family/ staff Communication:   Labs/tests ordered:  Bertram Millard. Hyacinth Meeker, MD [facility] [address] [place], [place] [address] Office [phone]

## 2022-09-21 DIAGNOSIS — R41841 Cognitive communication deficit: Secondary | ICD-10-CM | POA: Diagnosis not present

## 2022-09-21 DIAGNOSIS — R278 Other lack of coordination: Secondary | ICD-10-CM | POA: Diagnosis not present

## 2022-09-26 DIAGNOSIS — R278 Other lack of coordination: Secondary | ICD-10-CM | POA: Diagnosis not present

## 2022-09-26 DIAGNOSIS — R41841 Cognitive communication deficit: Secondary | ICD-10-CM | POA: Diagnosis not present

## 2022-09-27 DIAGNOSIS — R278 Other lack of coordination: Secondary | ICD-10-CM | POA: Diagnosis not present

## 2022-09-27 DIAGNOSIS — R41841 Cognitive communication deficit: Secondary | ICD-10-CM | POA: Diagnosis not present

## 2022-09-28 DIAGNOSIS — R41841 Cognitive communication deficit: Secondary | ICD-10-CM | POA: Diagnosis not present

## 2022-09-28 DIAGNOSIS — R278 Other lack of coordination: Secondary | ICD-10-CM | POA: Diagnosis not present

## 2022-09-29 DIAGNOSIS — J9811 Atelectasis: Secondary | ICD-10-CM | POA: Diagnosis not present

## 2022-09-29 DIAGNOSIS — J8410 Pulmonary fibrosis, unspecified: Secondary | ICD-10-CM | POA: Diagnosis not present

## 2022-09-30 DIAGNOSIS — R278 Other lack of coordination: Secondary | ICD-10-CM | POA: Diagnosis not present

## 2022-09-30 DIAGNOSIS — R41841 Cognitive communication deficit: Secondary | ICD-10-CM | POA: Diagnosis not present

## 2022-10-02 IMAGING — DX DG LUMBAR SPINE COMPLETE 4+V
5 series · 5 of 5 positions shown · non-contrast
Comparison: None.

CLINICAL DATA: Right greater than left low back pain with lower
extremity symptoms.

EXAM:
LUMBAR SPINE - COMPLETE 4+ VIEW

[l-spine ap]
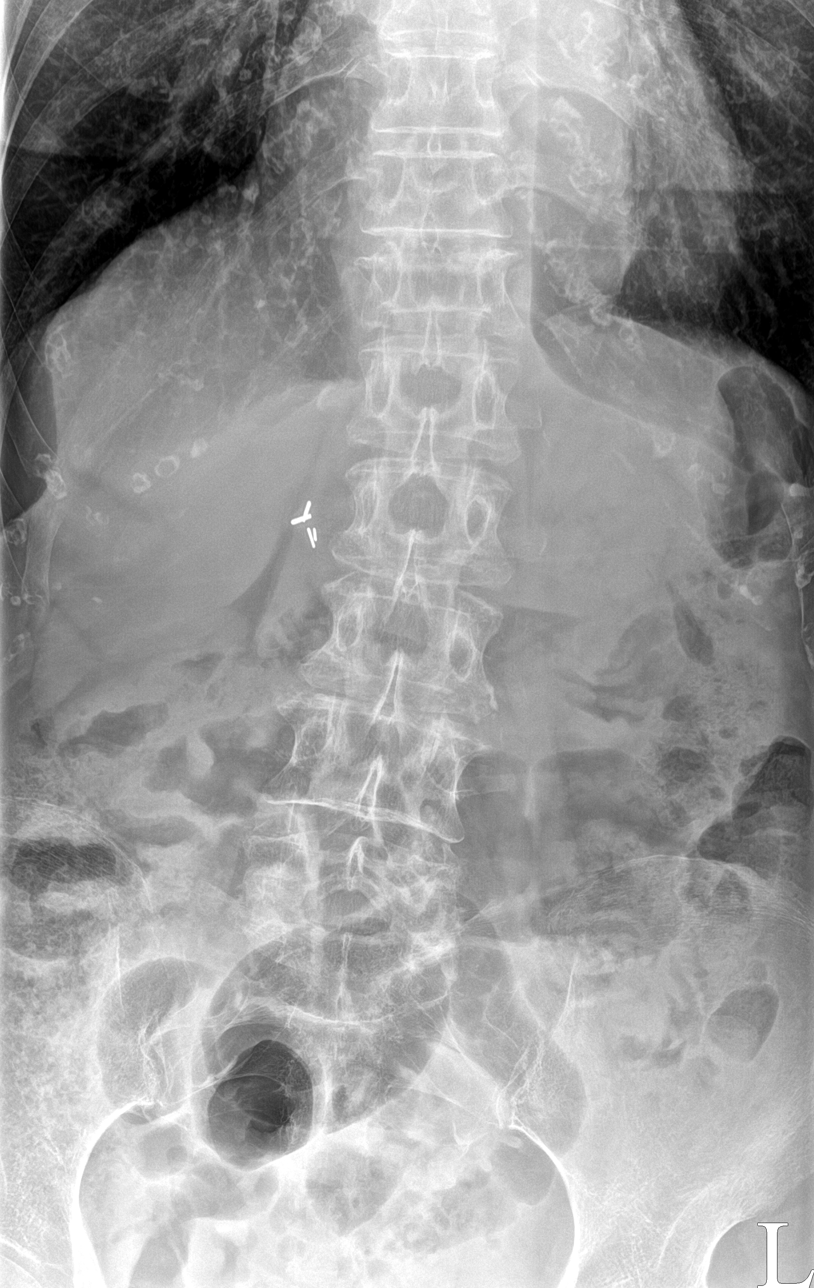

[l-spine obl (1 of 2)]
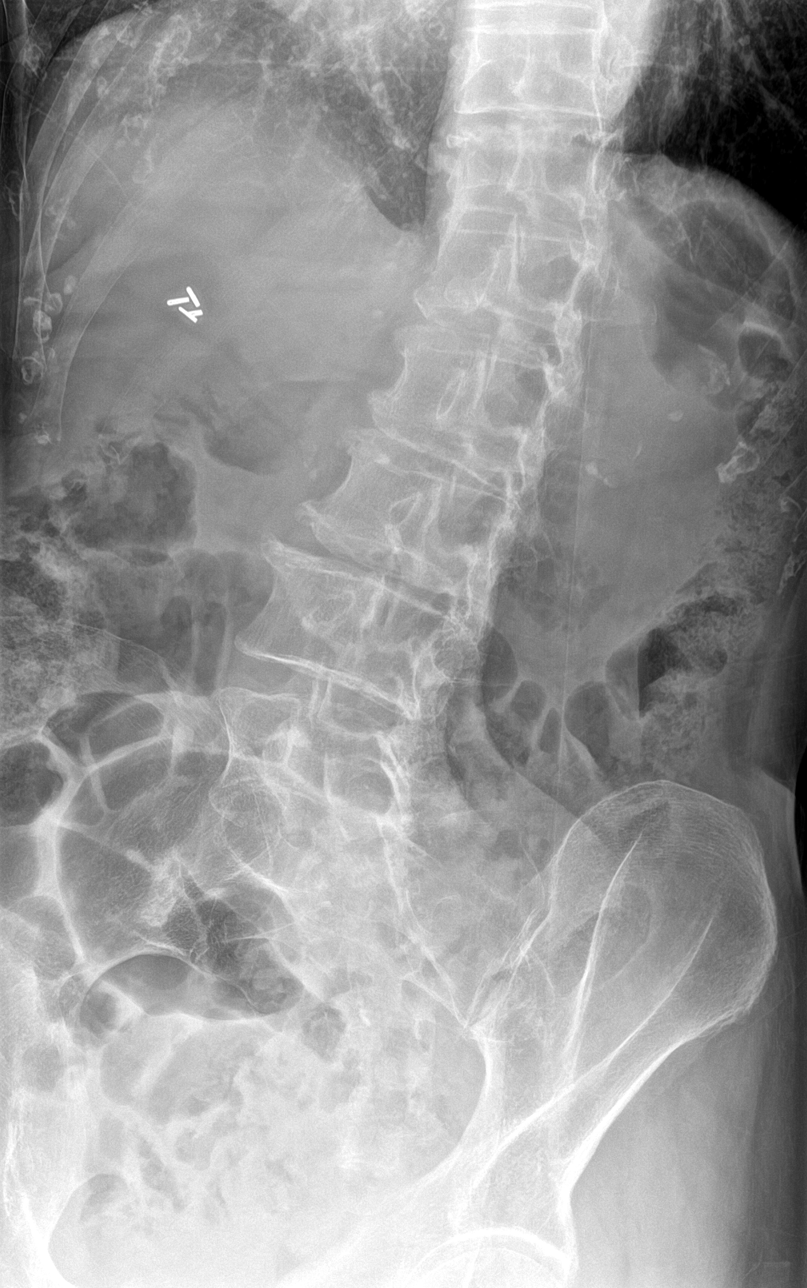

[l-spine obl (2 of 2)]
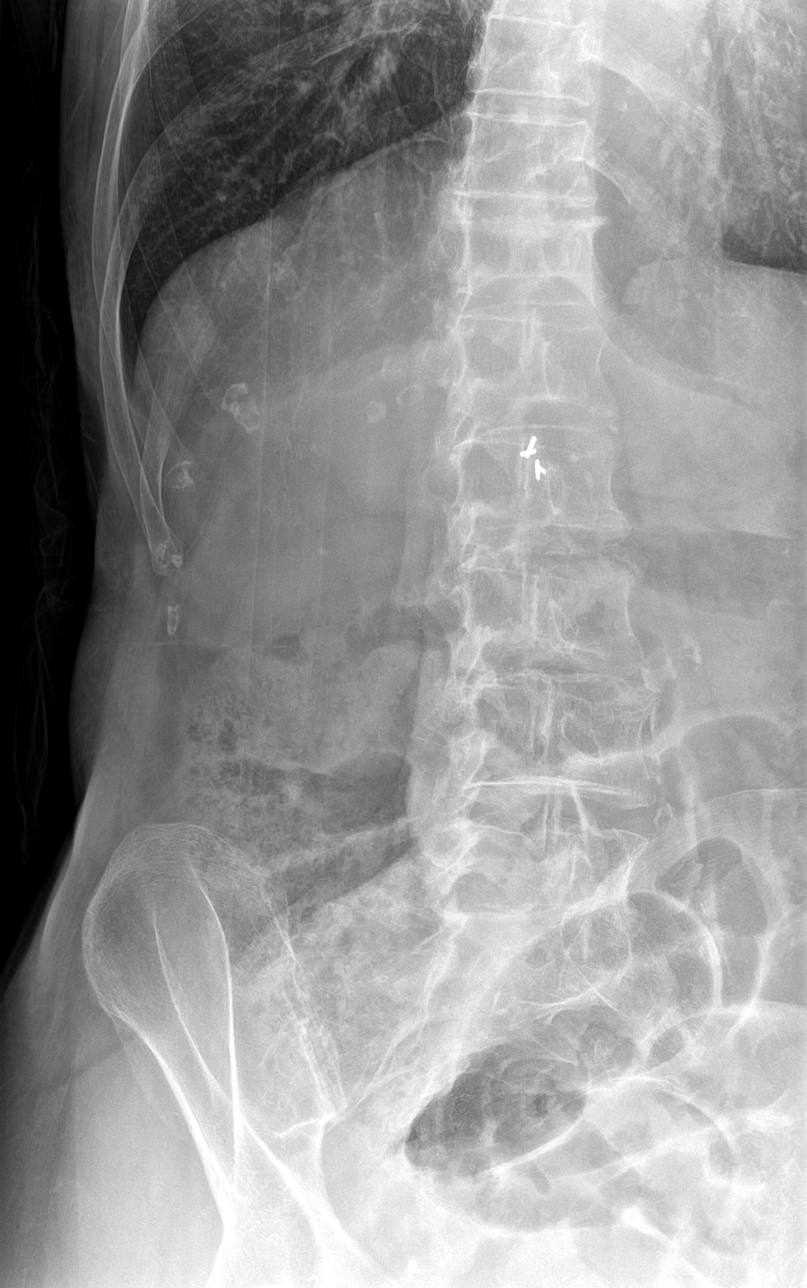

[l-spine lateral]
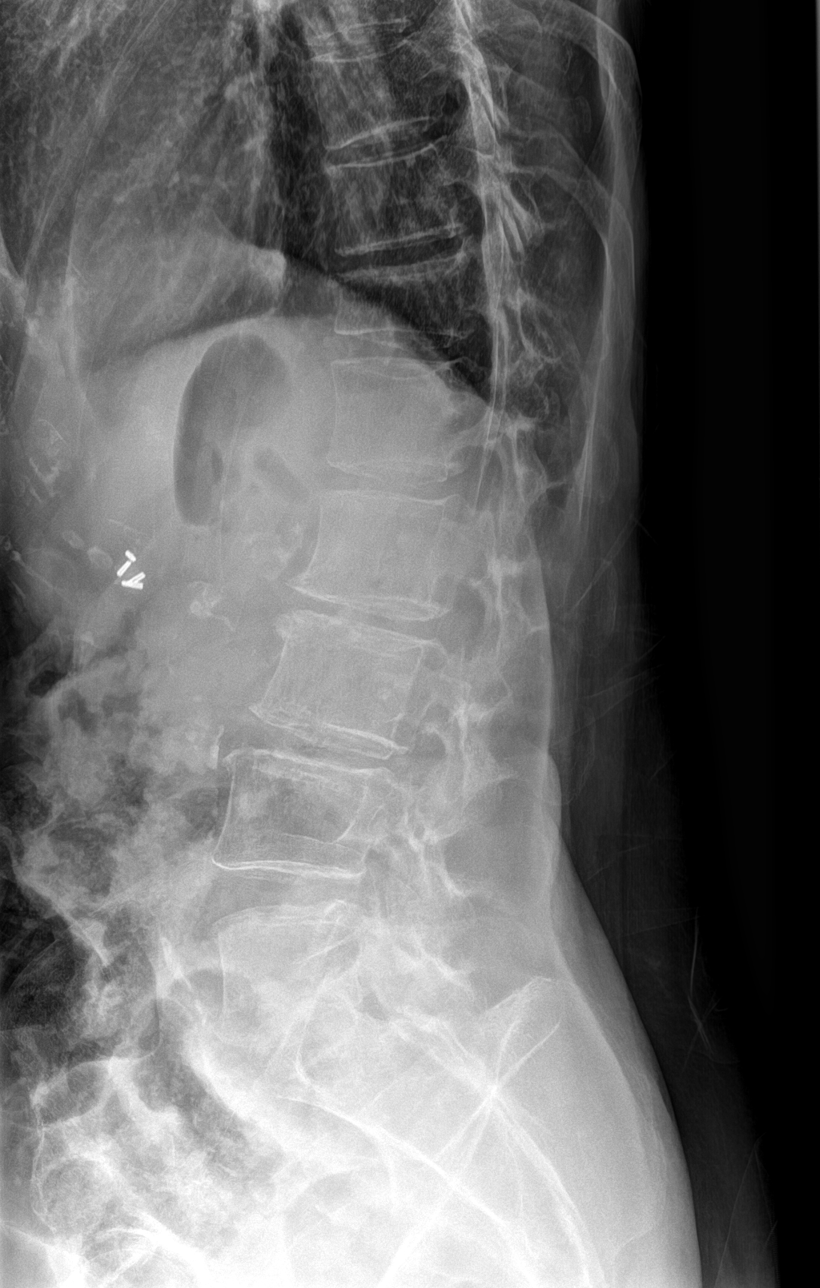

[l-spine spot]
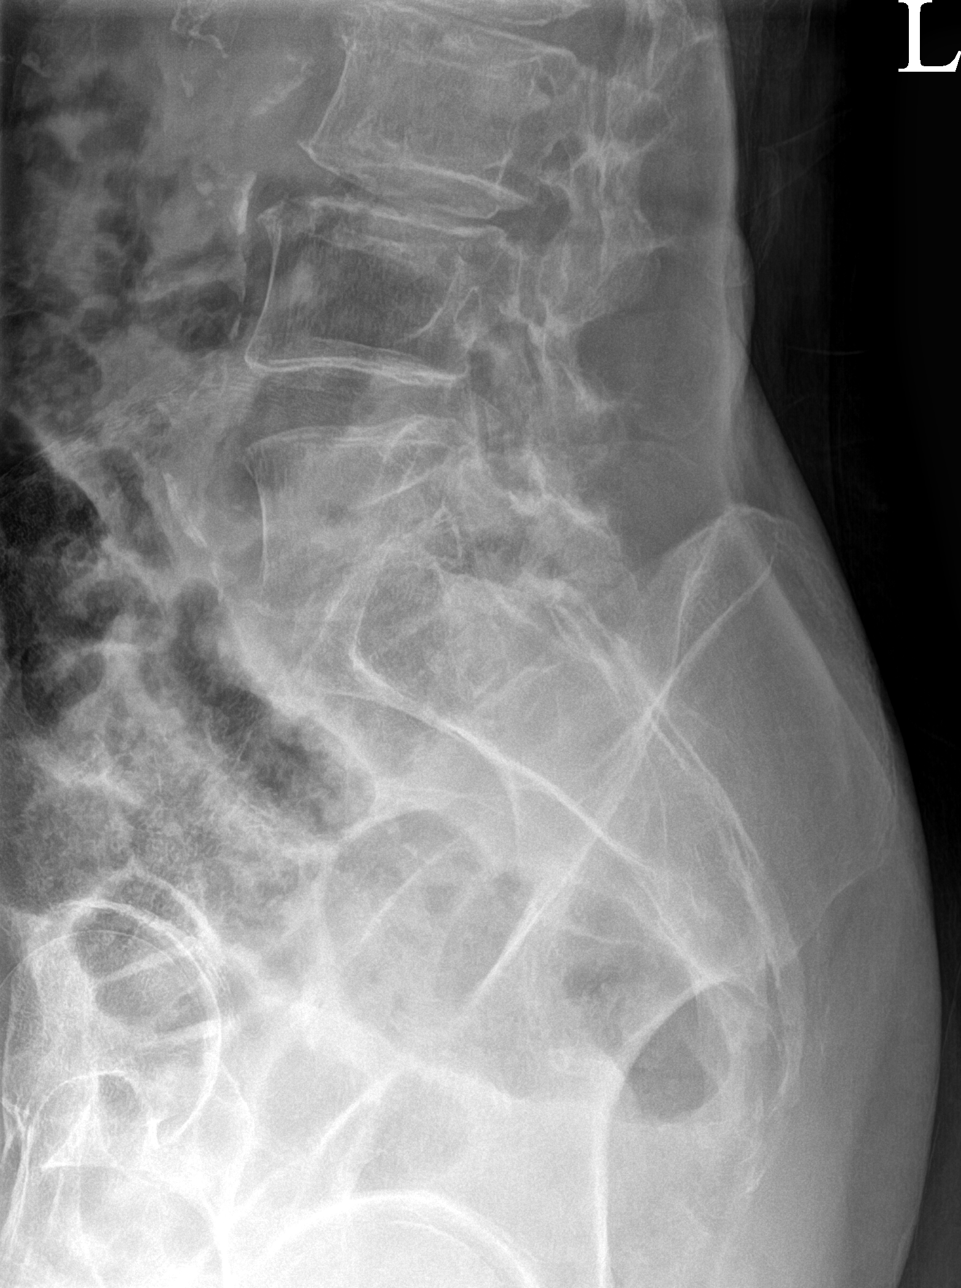

[5 of 5 positions shown; findings below may reference images not displayed]

FINDINGS: T12 demonstrates approximately 30% loss of height with mild anterior
wedging. No other evidence of fracture. No malalignment. Mild
multilevel degenerative disc disease with small anterior
osteophytes. Probable lower lumbar facet degenerative changes.
Calcified abdominal aortic atherosclerotic change.
IMPRESSION: 1. T12 demonstrates approximately 30% loss of height with mild
anterior wedging. This is an age-indeterminate finding but may be
nonacute. Recommend clinical correlation.
2. Degenerative changes as above.

## 2022-10-04 DIAGNOSIS — R278 Other lack of coordination: Secondary | ICD-10-CM | POA: Diagnosis not present

## 2022-10-04 DIAGNOSIS — R41841 Cognitive communication deficit: Secondary | ICD-10-CM | POA: Diagnosis not present

## 2022-10-05 ENCOUNTER — Non-Acute Institutional Stay (SKILLED_NURSING_FACILITY): Payer: Medicare Other | Admitting: Nurse Practitioner

## 2022-10-05 ENCOUNTER — Encounter: Payer: Self-pay | Admitting: Nurse Practitioner

## 2022-10-05 DIAGNOSIS — R45851 Suicidal ideations: Secondary | ICD-10-CM | POA: Diagnosis not present

## 2022-10-05 DIAGNOSIS — F0390 Unspecified dementia without behavioral disturbance: Secondary | ICD-10-CM | POA: Diagnosis not present

## 2022-10-05 DIAGNOSIS — R41841 Cognitive communication deficit: Secondary | ICD-10-CM | POA: Diagnosis not present

## 2022-10-05 DIAGNOSIS — F32A Depression, unspecified: Secondary | ICD-10-CM

## 2022-10-05 DIAGNOSIS — R627 Adult failure to thrive: Secondary | ICD-10-CM

## 2022-10-05 DIAGNOSIS — M545 Low back pain, unspecified: Secondary | ICD-10-CM

## 2022-10-05 DIAGNOSIS — R278 Other lack of coordination: Secondary | ICD-10-CM | POA: Diagnosis not present

## 2022-10-05 NOTE — Assessment & Plan Note (Signed)
TSH 2.25, Vit B12 >1504 06/30/21,  SNF [facility] for supportive care. MMSE 27/30 04/08/21 

## 2022-10-05 NOTE — Assessment & Plan Note (Signed)
stable, T11, T12, L1 compression fx, s/p T12 Kyphoplasty, takes Tylenol, Gabapentin, ambulates with walker, followed by [facility].

## 2022-10-05 NOTE — Progress Notes (Unsigned)
Location:   SNF [facility] Room Number: 30 Place of Service:  SNF (31) Provider: Arna Snipe Shoichi Mielke NP  Tanya Walker X, NP  Patient Care Team: Tanya Walker X, NP as PCP - General (Internal Medicine)  Extended Emergency Contact Information Primary Emergency Contact: Tanya, Walker Address: [address]          [place], [place] [address] [place] Home Phone: [phone] Relation: Spouse Secondary Emergency Contact: [facility] Phone: [phone] Relation: Daughter  Code Status:  DNR Goals of care: Advanced Directive information    09/07/2022    1:49 PM  Advanced Directives  Does Patient Have a Medical Advance Directive? Yes  Type of Estate agent of [name];Living will  Does patient want to make changes to medical advance directive? No - Patient declined  Copy of Healthcare Power of Attorney in Chart? Yes - validated most recent copy scanned in chart (See row information)     Chief Complaint  Patient presents with  . Medical Management of Chronic Issues    HPI:  Pt is a 82 y.o. female seen today for medical management of chronic diseases.    Urinary frequency, chronic, declined medication tx OP, never received Evenity, taking Vit D, Ca. DEXA 10/06/21 T score -1.935,  may consider Prolia if HPOA/pt desires.              Anemia, Iron 58, Hgb 13.1 07/21/22             HTN, off Amlodipine. Bun/creat 23/0.67 07/21/22             Constipation, takes Senokot S, MiraLax.              Dementia, TSH 2.25, Vit B12 >1504 06/30/21,  SNF [facility] for supportive care. MMSE 27/30 04/08/21             Vit B12 deficiency, Vit B12 level >1504 06/30/21, takes Vit B12             Vitamin D deficiency, takes Vit d daily, Vit D 52 07/21/22             Allergic rhinitis, takes Zyrtec.             Hyperlipidemia, diet, LDL 129 07/21/22             Depression, hx of suicidal attempt/ideations, anxious at times,  on Prozac, Zyprexa, Depakote. Mirtazapine made her AMS,  TSH 1.93 07/21/22             Chronic back pain, stable, T11, T12, L1 compression fx, s/p T12 Kyphoplasty, takes Tylenol, Gabapentin, ambulates with walker, followed by [facility].              Weight loss/adult failure to thrive, stable, low body weight,  didn't tolerate Mirtazapine.             Thrombocytopenia, plt 178 07/21/22    Past Medical History:  Diagnosis Date  . Anxiety    no meds  . 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 Depression    years ago  . Headache    sinus headaches  . Lipoma of arm    Right  . Medical history non-contributory   . Osteoporosis   . SVD (spontaneous vaginal delivery)    Walker 2  . Varicose vein   . Vitamin D deficiency    Past Surgical History:  Procedure Laterality Date  . ANTERIOR  APPROACH HEMI HIP ARTHROPLASTY Right 09/04/2020   Procedure: ANTERIOR APPROACH HEMI HIP ARTHROPLASTY;  Surgeon: Samson Frederic, MD;  Location: [facility];  Service: Orthopedics;  Laterality: Right;  . APPENDECTOMY  1947  . CHOLECYSTECTOMY  1988  . COLONOSCOPY    . GANGLION CYST EXCISION     x2 left and right arm  . HYSTEROSCOPY WITH D & C N/A 07/15/2013   Procedure: DILATATION AND CURETTAGE /HYSTEROSCOPY;  Surgeon: Lenoard Aden, MD;  Location: [facility];  Service: Gynecology;  Laterality: N/A;  . KYPHOPLASTY Bilateral 05/04/2020   Procedure: KYPHOPLASTY THORACIC TWELVE;  Surgeon: Bedelia Person, MD;  Location: [facility] OR;  Service: Neurosurgery;  Laterality: Bilateral;  . MANDIBLE SURGERY  1954  . WISDOM TOOTH EXTRACTION      Allergies  Allergen Reactions  . Actonel [Risedronate] Other (See Comments)    "Allergic," per MAR  . Alendronate Sodium Other (See Comments)    Leg cramps and "Allergic," per MAR  . Bisphosphonates   . Fosamax [Alendronate]   . Remeron [Mirtazapine]   . Risedronate Sodium Other (See Comments)    Caused the patient to be achy and is "Allergic," per MAR  . Tramadol Other (See Comments)    Caused the  patient to feel badly   . Keflex [Cephalexin] Other (See Comments)    Possible rash    Allergies as of 10/05/2022       Reactions   Actonel [risedronate] Other (See Comments)   "Allergic," per MAR   Alendronate Sodium Other (See Comments)   Leg cramps and "Allergic," per MAR   Bisphosphonates    Fosamax [alendronate]    Remeron [mirtazapine]    Risedronate Sodium Other (See Comments)   Caused the patient to be achy and is "Allergic," per MAR   Tramadol Other (See Comments)   Caused the patient to feel badly    Keflex [cephalexin] Other (See Comments)   Possible rash        Medication List        Accurate as of October 05, 2022  1:14 PM. If you have any questions, ask your nurse or doctor.          acetaminophen 325 MG tablet Commonly known as: TYLENOL Take 650 mg by mouth in the morning and at bedtime.   ASPIRIN 81 PO Take 1 tablet by mouth daily.   B-12 + FOLIC ACID PO Take 1 tablet by mouth as directed. Every 2 days   CALCIUM 600 PO Take 1 tablet by mouth in the morning and at bedtime.   cetirizine 10 MG tablet Commonly known as: ZyrTEC Allergy Take 1 tablet (10 mg total) by mouth daily.   divalproex 125 MG capsule Commonly known as: DEPAKOTE SPRINKLE Take 125 mg by mouth 2 (two) times daily.   docusate sodium 100 MG capsule Commonly known as: COLACE Take 100 mg by mouth at bedtime.   gabapentin 100 MG capsule Commonly known as: NEURONTIN Take 1 capsule (100 mg total) by mouth 3 (three) times daily.   melatonin 5 MG Tabs Take 5 mg by mouth at bedtime.   Nizoral A-D 1 % Sham Generic drug: KETOCONAZOLE (TOPICAL) Apply 1 application. topically once a week. Monday   OLANZapine 7.5 MG tablet Commonly known as: ZYPREXA Take 7.5 mg by mouth at bedtime.   polyethylene glycol 17 g packet Commonly known as: MIRALAX / GLYCOLAX Take 17 g by mouth daily.   PRESERVISION AREDS 2 PO Take 1 capsule by mouth 2 (two) times  daily.   senna-docusate 8.6-50  MG tablet Commonly known as: Senokot-S Take 2 tablets by mouth daily.   sertraline 50 MG tablet Commonly known as: ZOLOFT Take 50 mg by mouth daily.   Vitamin D3 50 MCG (2000 UT) capsule Take 1 capsule (2,000 Units total) by mouth daily.        Review of Systems  Constitutional:  Negative for activity change, fever and unexpected weight change.  HENT:  Positive for hearing loss. Negative for congestion and voice change.   Eyes:  Negative for visual disturbance.  Respiratory:  Negative for cough and shortness of breath.   Cardiovascular:  Negative for leg swelling.  Gastrointestinal:  Negative for abdominal pain and constipation.  Genitourinary:  Positive for frequency. Negative for difficulty urinating, dysuria and urgency.  Musculoskeletal:  Positive for arthralgias, back pain and gait problem.  Skin:  Negative for color change.  Neurological:  Negative for speech difficulty, weakness and headaches.       Dementia.   Psychiatric/Behavioral:  Positive for sleep disturbance. Negative for confusion and hallucinations. The patient is nervous/anxious.     Immunization History  Administered Date(s) Administered  . DT (Pediatric) 04/10/2002  . Fluad Quad(high Dose 65+) 10/28/2019  . Influenza Split 03/08/2011, 11/17/2011  . Influenza Whole 01/09/2007, 11/05/2008, 11/04/2009  . Influenza, High Dose Seasonal PF 12/07/2015, 09/29/2016, 11/09/2017, 09/27/2018, 10/28/2019  . Influenza,trivalent, recombinat, inj, PF 03/08/2011, 11/17/2011  . Influenza-Unspecified 10/30/2012, 11/29/2013, 12/02/2014, 11/25/2020  . Moderna Covid-19 Vaccine Bivalent Booster 41yrs & up 12/09/2021  . Moderna Sars-Covid-2 Vaccination 02/08/2019, 03/11/2019  . PFIZER(Purple Top)SARS-COV-2 Vaccination 10/09/2019  . PPD Test 12/22/2015, 01/05/2016  . Pneumococcal Conjugate-13 07/17/2014  . Pneumococcal Polysaccharide-23 08/15/2005, 09/25/2015  . Tetanus 12/24/2012  . Unspecified SARS-COV-2 Vaccination  02/11/2019, 12/17/2019, 10/27/2020  . Zoster Recombinant(Shingrix) 06/07/2019, 09/06/2019  . Zoster, Live 12/05/2013   Pertinent  Health Maintenance Due  Topic Date Due  . INFLUENZA VACCINE  09/08/2022  . DEXA SCAN  Completed      01/12/2021   10:01 PM 01/13/2021    8:14 AM 06/30/2021    3:06 PM 11/09/2021   11:37 AM 01/18/2022   10:20 AM  Fall Risk  Falls in the past year?   0 0 0  Was there an injury with Fall?   0 0 0  Fall Risk Category Calculator   0 0 0  Fall Risk Category (Retired)   Low Low Low  (RETIRED) Patient Fall Risk Level   High fall risk Low fall risk Low fall risk  Patient at Risk for Falls Due to   Impaired mobility;Impaired balance/gait;History of fall(s) No Fall Risks No Fall Risks  Fall risk Follow up    Falls evaluation completed Falls evaluation completed     Information is confidential and restricted. Go to Review Flowsheets to unlock data.   Functional Status Survey:    Vitals:   10/05/22 1311  BP: 122/76  Pulse: 78  Resp: 17  Temp: (!) 97.4 F (36.3 C)  SpO2: 98%  Weight: 87 lb 4.8 oz (39.6 kg)   Body mass index is 14.99 kg/m. Physical Exam Vitals and nursing note reviewed.  Constitutional:      Appearance: Normal appearance.  HENT:     Head: Normocephalic.     Nose: Nose normal.     Mouth/Throat:     Mouth: Mucous membranes are moist.  Eyes:     General:        Right eye: No discharge.  Left eye: No discharge.  Cardiovascular:     Rate and Rhythm: Normal rate.     Heart sounds: No murmur heard. Pulmonary:     Effort: Pulmonary effort is normal.     Breath sounds: No rales.  Abdominal:     General: Bowel sounds are normal.     Palpations: Abdomen is soft.     Tenderness: There is no abdominal tenderness.  Musculoskeletal:     Cervical back: Normal range of motion and neck supple.     Right lower leg: No edema.     Left lower leg: No edema.     Comments: Right hip s/p ORIF 09/08/20, healed.   Skin:    General: Skin is  warm and dry.  Neurological:     General: No focal deficit present.     Mental Status: She is alert and oriented to person, place, and time. Mental status is at baseline.     Motor: No weakness.     Coordination: Coordination normal.     Gait: Gait abnormal.     Comments: Ambulates with walker.   Psychiatric:        Mood and Affect: Mood normal.        Behavior: Behavior normal.     Comments: Confused, appears anxious, in hurry, at her baseline    Labs reviewed: Recent Labs    05/17/22 0000 07/21/22 0000  NA 138 141  K 4.4 3.6  CL 101 100  CO2 31* 32*  BUN 19 23*  CREATININE 0.7 0.7  CALCIUM 9.1 9.3   Recent Labs    05/17/22 0000 07/21/22 0000  AST 19 19  ALT 15 15  ALKPHOS 46 53  ALBUMIN 3.7 4.0   Recent Labs    05/17/22 0000 07/21/22 0000  WBC 5.1 5.3  NEUTROABS 2,627.00 2,889.00  HGB 12.6 13.1  HCT 39 41  PLT 173 178   Lab Results  Component Value Date   TSH 1.93 07/21/2022   Lab Results  Component Value Date   HGBA1C 5.5 01/06/2021   Lab Results  Component Value Date   CHOL 221 (A) 07/21/2022   HDL 76 (A) 07/21/2022   LDLCALC 129 07/21/2022   LDLDIRECT 135.4 12/26/2008   TRIG 70 07/21/2022   CHOLHDL 3.1 01/06/2021    Significant Diagnostic Results in last 30 days:  No results found.  Assessment/Plan  No problem-specific Assessment & Plan notes found for this encounter.   Family/ staff Communication: plan of care reviewed with the patient and charge nurse.   Labs/tests ordered:  none  Time spend 35 minutes.

## 2022-10-05 NOTE — Assessment & Plan Note (Signed)
hx of suicidal attempt/ideations, anxious at times,  on Prozac, Zyprexa, Depakote. Mirtazapine made her AMS, TSH 1.93 07/21/22

## 2022-10-05 NOTE — Assessment & Plan Note (Signed)
stable, low body weight,  didn't tolerate Mirtazapine.

## 2022-10-07 DIAGNOSIS — R278 Other lack of coordination: Secondary | ICD-10-CM | POA: Diagnosis not present

## 2022-10-07 DIAGNOSIS — R41841 Cognitive communication deficit: Secondary | ICD-10-CM | POA: Diagnosis not present

## 2022-10-10 DIAGNOSIS — R278 Other lack of coordination: Secondary | ICD-10-CM | POA: Diagnosis not present

## 2022-10-10 DIAGNOSIS — R41841 Cognitive communication deficit: Secondary | ICD-10-CM | POA: Diagnosis not present

## 2022-10-11 DIAGNOSIS — R41841 Cognitive communication deficit: Secondary | ICD-10-CM | POA: Diagnosis not present

## 2022-10-11 DIAGNOSIS — R278 Other lack of coordination: Secondary | ICD-10-CM | POA: Diagnosis not present

## 2022-10-12 DIAGNOSIS — R41841 Cognitive communication deficit: Secondary | ICD-10-CM | POA: Diagnosis not present

## 2022-10-12 DIAGNOSIS — R278 Other lack of coordination: Secondary | ICD-10-CM | POA: Diagnosis not present

## 2022-10-13 DIAGNOSIS — R278 Other lack of coordination: Secondary | ICD-10-CM | POA: Diagnosis not present

## 2022-10-13 DIAGNOSIS — R41841 Cognitive communication deficit: Secondary | ICD-10-CM | POA: Diagnosis not present

## 2022-10-14 DIAGNOSIS — R278 Other lack of coordination: Secondary | ICD-10-CM | POA: Diagnosis not present

## 2022-10-14 DIAGNOSIS — R41841 Cognitive communication deficit: Secondary | ICD-10-CM | POA: Diagnosis not present

## 2022-10-17 DIAGNOSIS — R278 Other lack of coordination: Secondary | ICD-10-CM | POA: Diagnosis not present

## 2022-10-17 DIAGNOSIS — R41841 Cognitive communication deficit: Secondary | ICD-10-CM | POA: Diagnosis not present

## 2022-10-19 DIAGNOSIS — R41841 Cognitive communication deficit: Secondary | ICD-10-CM | POA: Diagnosis not present

## 2022-10-19 DIAGNOSIS — R278 Other lack of coordination: Secondary | ICD-10-CM | POA: Diagnosis not present

## 2022-10-20 DIAGNOSIS — R278 Other lack of coordination: Secondary | ICD-10-CM | POA: Diagnosis not present

## 2022-10-20 DIAGNOSIS — R41841 Cognitive communication deficit: Secondary | ICD-10-CM | POA: Diagnosis not present

## 2022-10-21 ENCOUNTER — Non-Acute Institutional Stay (SKILLED_NURSING_FACILITY): Payer: Self-pay | Admitting: Nurse Practitioner

## 2022-10-21 ENCOUNTER — Encounter: Payer: Self-pay | Admitting: Nurse Practitioner

## 2022-10-21 DIAGNOSIS — F32A Depression, unspecified: Secondary | ICD-10-CM | POA: Diagnosis not present

## 2022-10-21 DIAGNOSIS — M810 Age-related osteoporosis without current pathological fracture: Secondary | ICD-10-CM

## 2022-10-21 DIAGNOSIS — J3089 Other allergic rhinitis: Secondary | ICD-10-CM

## 2022-10-21 DIAGNOSIS — I10 Essential (primary) hypertension: Secondary | ICD-10-CM

## 2022-10-21 DIAGNOSIS — M545 Low back pain, unspecified: Secondary | ICD-10-CM

## 2022-10-21 DIAGNOSIS — E785 Hyperlipidemia, unspecified: Secondary | ICD-10-CM

## 2022-10-21 DIAGNOSIS — R634 Abnormal weight loss: Secondary | ICD-10-CM

## 2022-10-21 DIAGNOSIS — D500 Iron deficiency anemia secondary to blood loss (chronic): Secondary | ICD-10-CM

## 2022-10-21 DIAGNOSIS — E538 Deficiency of other specified B group vitamins: Secondary | ICD-10-CM | POA: Diagnosis not present

## 2022-10-21 DIAGNOSIS — K5901 Slow transit constipation: Secondary | ICD-10-CM

## 2022-10-21 DIAGNOSIS — F0390 Unspecified dementia without behavioral disturbance: Secondary | ICD-10-CM | POA: Diagnosis not present

## 2022-10-21 DIAGNOSIS — E559 Vitamin D deficiency, unspecified: Secondary | ICD-10-CM | POA: Diagnosis not present

## 2022-10-21 DIAGNOSIS — D696 Thrombocytopenia, unspecified: Secondary | ICD-10-CM

## 2022-10-21 DIAGNOSIS — R350 Frequency of micturition: Secondary | ICD-10-CM

## 2022-10-21 DIAGNOSIS — R45851 Suicidal ideations: Secondary | ICD-10-CM

## 2022-10-21 NOTE — Progress Notes (Unsigned)
Location:   SNF [facility] Room Number: 30 Place of Service:  SNF (31)  Provider: Chipper Oman NP  PCP: Frances Ambrosino X, NP Patient Care Team: Anaisha Mago X, NP as PCP - General (Internal Medicine)  Extended Emergency Contact Information Primary Emergency Contact: Copelynn, Chiodi Address: [address]          [place], [place] [address] [place] Home Phone: [phone] Relation: Spouse Secondary Emergency Contact: [facility] Phone: [phone] Relation: Daughter  Code Status: DNR Goals of care:  Advanced Directive information    09/07/2022    1:49 PM  Advanced Directives  Does Patient Have a Medical Advance Directive? Yes  Type of Estate agent of [place];Living will  Does patient want to make changes to medical advance directive? No - Patient declined  Copy of Healthcare Power of Attorney in Chart? Yes - validated most recent copy scanned in chart (See row information)     Allergies  Allergen Reactions  . Actonel [Risedronate] Other (See Comments)    "Allergic," per MAR  . Alendronate Sodium Other (See Comments)    Leg cramps and "Allergic," per MAR  . Bisphosphonates   . Fosamax [Alendronate]   . Remeron [Mirtazapine]   . Risedronate Sodium Other (See Comments)    Caused the patient to be achy and is "Allergic," per MAR  . Tramadol Other (See Comments)    Caused the patient to feel badly   . Keflex [Cephalexin] Other (See Comments)    Possible rash    Chief Complaint  Patient presents with  . Acute Visit    Discharge to a SNF [place].     HPI:  82 y.o. female with medical history significant of urinary frequency, OP, anemia, HTN, constipation, dementia, Vit B12, Vit D, allergic rhinitis, HLD, depression, chronic back pain, weight loss, Thrombocytopenia admitted to SNF [facility] following hospitalization for suicidal ideation.   The patient has stabilized emotionally and physically, she needs SNF for continuation of  care.   Urinary frequency, chronic, declined medication tx OP, never received Evenity, taking Vit D, Ca. DEXA 10/06/21 T score -1.935,  may consider Prolia if HPOA/pt desires.              Anemia, Iron 58, Hgb 13.1 07/21/22             HTN, off Amlodipine. Bun/creat 23/0.67 07/21/22             Constipation, takes Senokot S, MiraLax.              Dementia, TSH 2.25, Vit B12 >1504 06/30/21,  SNF [facility] for supportive care. MMSE 27/30 04/08/21             Vit B12 deficiency, Vit B12 level >1504 06/30/21, takes Vit B12             Vitamin D deficiency, takes Vit d daily, Vit D 52 07/21/22             Allergic rhinitis, takes Zyrtec.             Hyperlipidemia, diet, LDL 129 07/21/22             Depression, hx of suicidal attempt/ideations, anxious at times,  on Prozac, Zyprexa, Depakote. Mirtazapine made her AMS, TSH 1.93 07/21/22             Chronic back pain, stable, T11, T12, L1 compression fx, s/p T12 Kyphoplasty, takes Tylenol, Gabapentin, ambulates with walker, followed by [facility].              Weight loss/adult failure to thrive, stable, low body weight,  didn't tolerate Mirtazapine.             Thrombocytopenia, plt 178 07/21/22    Past Medical History:  Diagnosis Date  . Anxiety    no meds  . 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 Depression    years ago  . Headache    sinus headaches  . Lipoma of arm    Right  . Medical history non-contributory   . Osteoporosis   . SVD (spontaneous vaginal delivery)    x 2  . Varicose vein   . Vitamin D deficiency     Past Surgical History:  Procedure Laterality Date  . ANTERIOR APPROACH HEMI HIP ARTHROPLASTY Right 09/04/2020   Procedure: ANTERIOR APPROACH HEMI HIP ARTHROPLASTY;  Surgeon: Samson Frederic, MD;  Location: [facility] ORS;  Service: Orthopedics;  Laterality: Right;  . APPENDECTOMY  1947  . CHOLECYSTECTOMY  1988  . COLONOSCOPY    . GANGLION CYST EXCISION     x2 left and right arm  . HYSTEROSCOPY WITH  D & C N/A 07/15/2013   Procedure: DILATATION AND CURETTAGE /HYSTEROSCOPY;  Surgeon: Lenoard Aden, MD;  Location: [facility];  Service: Gynecology;  Laterality: N/A;  . KYPHOPLASTY Bilateral 05/04/2020   Procedure: KYPHOPLASTY THORACIC TWELVE;  Surgeon: Bedelia Person, MD;  Location: [facility] OR;  Service: Neurosurgery;  Laterality: Bilateral;  . MANDIBLE SURGERY  1954  . WISDOM TOOTH EXTRACTION        reports that she has never smoked. She has never used smokeless tobacco. She reports that she does not drink alcohol and does not use drugs. Social History   Socioeconomic History  . Marital status: Married    Spouse name: Not on file  . Number of children: 1  . Years of education: Not on file  . Highest education level: Not on file  Occupational History  . Occupation: retired  Tobacco Use  . Smoking status: Never  . Smokeless tobacco: Never  Vaping Use  . Vaping status: Never Used  Substance and Sexual Activity  . Alcohol use: No  . Drug use: No  . Sexual activity: Yes    Birth control/protection: Post-menopausal  Other Topics Concern  . Not on file  Social History Narrative   GI in [place] for colonoscopy   Dr Billy [place] - GYN      Denies Surgical history      Family history of varicose veins   Mother is 40      Regular exercise - NO   Social Determinants of Health   Financial Resource Strain: Low Risk  (10/10/2019)   Overall Financial Resource Strain (CARDIA)   . Difficulty of Paying Living Expenses: Not hard at all  Food Insecurity: No Food Insecurity (07/28/2020)   Received from [facility]   Hunger Vital Sign   . Worried About Programme researcher, broadcasting/film/video in the Last Year: Never true   . Ran Out of Food in the Last Year: Never true  Transportation Needs: No Transportation Needs (10/10/2019)   PRAPARE - Transportation   . Lack of Transportation (Medical): No   . Lack of Transportation (Non-Medical): No  Physical Activity: Sufficiently Active (10/10/2019)   Exercise Vital  Sign   . Days of Exercise per Week: 5 days   . Minutes of Exercise per  Session: 30 min  Stress: Stress Concern Present (10/10/2019)   [employer] of Occupational Health - Occupational Stress Questionnaire   . Feeling of Stress : Rather much  Social Connections: Unknown (06/22/2021)   Received [facility], [facility]   Social Network   . Social Network: Not on file  Intimate Partner Violence: Unknown (05/14/2021)   Received from [facility], [facility]   HITS   . Physically Hurt: Not on file   . Insult or Talk Down To: Not on file   . Threaten Physical Harm: Not on file   . Scream or Curse: Not on file   Functional Status Survey:    Allergies  Allergen Reactions  . Actonel [Risedronate] Other (See Comments)    "Allergic," per MAR  . Alendronate Sodium Other (See Comments)    Leg cramps and "Allergic," per MAR  . Bisphosphonates   . Fosamax [Alendronate]   . Remeron [Mirtazapine]   . Risedronate Sodium Other (See Comments)    Caused the patient to be achy and is "Allergic," per MAR  . Tramadol Other (See Comments)    Caused the patient to feel badly   . Keflex [Cephalexin] Other (See Comments)    Possible rash    Pertinent  Health Maintenance Due  Topic Date Due  . INFLUENZA VACCINE  09/08/2022  . DEXA SCAN  Completed    Medications: Allergies as of 10/21/2022       Reactions   Actonel [risedronate] Other (See Comments)   "Allergic," per MAR   Alendronate Sodium Other (See Comments)   Leg cramps and "Allergic," per MAR   Bisphosphonates    Fosamax [alendronate]    Remeron [mirtazapine]    Risedronate Sodium Other (See Comments)   Caused the patient to be achy and is "Allergic," per MAR   Tramadol Other (See Comments)   Caused the patient to feel badly    Keflex [cephalexin] Other (See Comments)   Possible rash        Medication List        Accurate as of October 21, 2022 11:59 PM. If you have any questions, ask your nurse or doctor.           acetaminophen 325 MG tablet Commonly known as: TYLENOL Take 650 mg by mouth in the morning and at bedtime.   ASPIRIN 81 PO Take 1 tablet by mouth daily.   B-12 + FOLIC ACID PO Take 1 tablet by mouth as directed. Every 2 days   CALCIUM 600 PO Take 1 tablet by mouth in the morning and at bedtime.   cetirizine 10 MG tablet Commonly known as: ZyrTEC Allergy Take 1 tablet (10 mg total) by mouth daily.   divalproex 125 MG capsule Commonly known as: DEPAKOTE SPRINKLE Take 125 mg by mouth 2 (two) times daily.   docusate sodium 100 MG capsule Commonly known as: COLACE Take 100 mg by mouth at bedtime.   gabapentin 100 MG capsule Commonly known as: NEURONTIN Take 1 capsule (100 mg total) by mouth 3 (three) times daily.   melatonin 5 MG Tabs Take 5 mg by mouth at bedtime.   Nizoral A-D 1 % Sham Generic drug: KETOCONAZOLE (TOPICAL) Apply 1 application. topically once a week. Monday   OLANZapine 7.5 MG tablet Commonly known as: ZYPREXA Take 7.5 mg by mouth at bedtime.   polyethylene glycol 17 g packet Commonly known as: MIRALAX / GLYCOLAX Take 17 g by mouth daily.   PRESERVISION AREDS 2 PO Take  1 capsule by mouth 2 (two) times daily.   senna-docusate 8.6-50 MG tablet Commonly known as: Senokot-S Take 2 tablets by mouth daily.   sertraline 50 MG tablet Commonly known as: ZOLOFT Take 50 mg by mouth daily.   Vitamin D3 50 MCG (2000 UT) capsule Take 1 capsule (2,000 Units total) by mouth daily.        Review of Systems  Constitutional:  Negative for activity change, fever and unexpected weight change.  HENT:  Positive for hearing loss. Negative for congestion and voice change.   Eyes:  Negative for visual disturbance.  Respiratory:  Negative for cough and shortness of breath.   Cardiovascular:  Negative for leg swelling.  Gastrointestinal:  Negative for abdominal pain and constipation.  Genitourinary:  Positive for frequency. Negative for difficulty  urinating, dysuria and urgency.  Musculoskeletal:  Positive for arthralgias, back pain and gait problem.  Skin:  Negative for color change.  Neurological:  Negative for speech difficulty, weakness and headaches.       Dementia.   Psychiatric/Behavioral:  Positive for sleep disturbance. Negative for confusion and hallucinations. The patient is nervous/anxious.     Vitals:   10/21/22 1651  BP: 136/70  Pulse: 68  Resp: 16  Temp: (!) 97 F (36.1 C)  SpO2: 97%  Weight: 89 lb 9.6 oz (40.6 kg)   Body mass index is 15.38 kg/m. Physical Exam Vitals and nursing note reviewed.  Constitutional:      Appearance: Normal appearance.  HENT:     Head: Normocephalic.     Nose: Nose normal.     Mouth/Throat:     Mouth: Mucous membranes are moist.  Eyes:     General:        Right eye: No discharge.        Left eye: No discharge.  Cardiovascular:     Rate and Rhythm: Normal rate.     Heart sounds: No murmur heard. Pulmonary:     Effort: Pulmonary effort is normal.     Breath sounds: No rales.  Abdominal:     General: Bowel sounds are normal.     Palpations: Abdomen is soft.     Tenderness: There is no abdominal tenderness.  Musculoskeletal:     Cervical back: Normal range of motion and neck supple.     Right lower leg: No edema.     Left lower leg: No edema.     Comments: Right hip s/p ORIF 09/08/20, healed.   Skin:    General: Skin is warm and dry.  Neurological:     General: No focal deficit present.     Mental Status: She is alert and oriented to person, place, and time. Mental status is at baseline.     Motor: No weakness.     Coordination: Coordination normal.     Gait: Gait abnormal.     Comments: Ambulates with walker.   Psychiatric:        Mood and Affect: Mood normal.        Behavior: Behavior normal.     Comments: Confused, appears anxious, in hurry, at her baseline    Labs reviewed: Basic Metabolic Panel: Recent Labs    05/17/22 0000 07/21/22 0000  NA 138 141   K 4.4 3.6  CL 101 100  CO2 31* 32*  BUN 19 23*  CREATININE 0.7 0.7  CALCIUM 9.1 9.3   Liver Function Tests: Recent Labs    05/17/22 0000 07/21/22 0000  AST 19 19  ALT 15 15  ALKPHOS 46 53  ALBUMIN 3.7 4.0   No results for input(s): "LIPASE", "AMYLASE" in the last 8760 hours. No results for input(s): "AMMONIA" in the last 8760 hours. CBC: Recent Labs    05/17/22 0000 07/21/22 0000  WBC 5.1 5.3  NEUTROABS 2,627.00 2,889.00  HGB 12.6 13.1  HCT 39 41  PLT 173 178   Cardiac Enzymes: No results for input(s): "CKTOTAL", "CKMB", "CKMBINDEX", "TROPONINI" in the last 8760 hours. BNP: Invalid input(s): "POCBNP" CBG: No results for input(s): "GLUCAP" in the last 8760 hours.  Procedures and Imaging Studies During Stay: No results found.  Assessment/Plan:   Urinary frequency chronic, declined medication tx  Osteoporosis never received Evenity, taking Vit D, Ca. DEXA 10/06/21 T score -1.935,  may consider Prolia if HPOA/pt desires.  Blood loss anemia Iron 58, Hgb 13.1 07/21/22  HTN (hypertension) off Amlodipine. Bun/creat 23/0.67 07/21/22, blood pressure is controlled.   Slow transit constipation Stable,  takes Senokot S, MiraLax.   Senile dementia (HCC) TSH 2.25, Vit B12 >1504 06/30/21,  SNF [facility] for supportive care. MMSE 27/30 04/08/21  Vitamin B12 deficiency Vit B12 level >1504 06/30/21, takes Vit B12  Vitamin D deficiency  takes Vit d daily, Vit D 52 07/21/22  Allergic rhinitis  takes Zyrtec.  Hyperlipidemia  diet, LDL 129 07/21/22  Depression with suicidal ideation  hx of suicidal attempt/ideations, anxious at times,  on Prozac, Zyprexa, Depakote. Mirtazapine made her AMS, TSH 1.93 07/21/22  Low back pain  stable, T11, T12, L1 compression fx, s/p T12 Kyphoplasty, takes Tylenol, Gabapentin, ambulates with walker, followed by [facility].   Weight loss, abnormal  stable, low body weight,  didn't tolerate  Mirtazapine.  Thrombocytopenia (HCC) plt 178 07/21/22   Patient is being discharged with the following home health services:    Patient is being discharged with the following durable medical equipment:    Patient has been advised to f/u with their PCP in 1-2 weeks to bring them up to date on their rehab stay.  Social services at facility was responsible for arranging this appointment.  Pt was provided with a 30 day supply of prescriptions for medications and refills must be obtained from their PCP.  For controlled substances, a more limited supply may be provided adequate until PCP appointment only.  Future labs/tests needed:  prn

## 2022-10-21 NOTE — Assessment & Plan Note (Signed)
chronic, declined medication tx

## 2022-10-21 NOTE — Assessment & Plan Note (Signed)
stable, low body weight,  didn't tolerate Mirtazapine.

## 2022-10-21 NOTE — Assessment & Plan Note (Signed)
takes Zyrtec.

## 2022-10-21 NOTE — Assessment & Plan Note (Signed)
Vit B12 level >1504 06/30/21, takes Vit B12

## 2022-10-21 NOTE — Assessment & Plan Note (Signed)
Stable,  takes Senokot S, MiraLax.

## 2022-10-21 NOTE — Assessment & Plan Note (Signed)
TSH 2.25, Vit B12 >1504 06/30/21,  SNF [facility] for supportive care. MMSE 27/30 04/08/21

## 2022-10-21 NOTE — Assessment & Plan Note (Signed)
never received Evenity, taking Vit D, Ca. DEXA 10/06/21 T score -1.935,  may consider Prolia if HPOA/pt desires.

## 2022-10-21 NOTE — Assessment & Plan Note (Signed)
Iron 58, Hgb 13.1 07/21/22

## 2022-10-21 NOTE — Assessment & Plan Note (Signed)
stable, T11, T12, L1 compression fx, s/p T12 Kyphoplasty, takes Tylenol, Gabapentin, ambulates with walker, followed by [facility].

## 2022-10-21 NOTE — Assessment & Plan Note (Signed)
takes Vit d daily, Vit D 52 07/21/22

## 2022-10-21 NOTE — Assessment & Plan Note (Signed)
diet, LDL 129 07/21/22

## 2022-10-21 NOTE — Assessment & Plan Note (Signed)
plt 178 07/21/22

## 2022-10-21 NOTE — Assessment & Plan Note (Signed)
off Amlodipine. Bun/creat 23/0.67 07/21/22, blood pressure is controlled.

## 2022-10-21 NOTE — Assessment & Plan Note (Signed)
hx of suicidal attempt/ideations, anxious at times,  on Prozac, Zyprexa, Depakote. Mirtazapine made her AMS, TSH 1.93 07/21/22

## 2022-10-22 IMAGING — CT CT T SPINE W/O CM
3 of 5 series · 9 of 33 positions shown, 10 images · non-contrast
Comparison: MR 04/07/2020

CLINICAL DATA: T12 fracture, pulled muscle 03/17/2020

EXAM:
CT THORACIC SPINE WITHOUT CONTRAST
TECHNIQUE: Multidetector CT images of the thoracic were obtained using the
standard protocol without intravenous contrast.

[Series 4: t-spine 2.00 br40 s3 · axial · 0.25mm/px · z∈[-975,-811]mm · 3 of 166 slices shown, 4 images]
[im 42/166  soft-tissue]
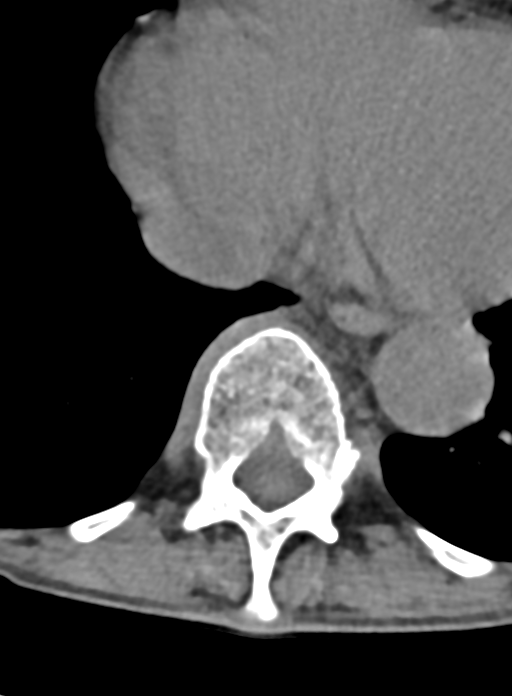
[im 42/166  bone]
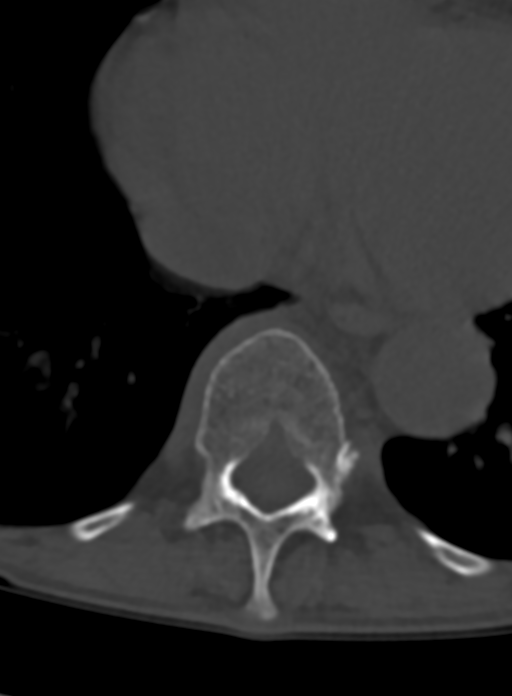
[im 83/166  bone]
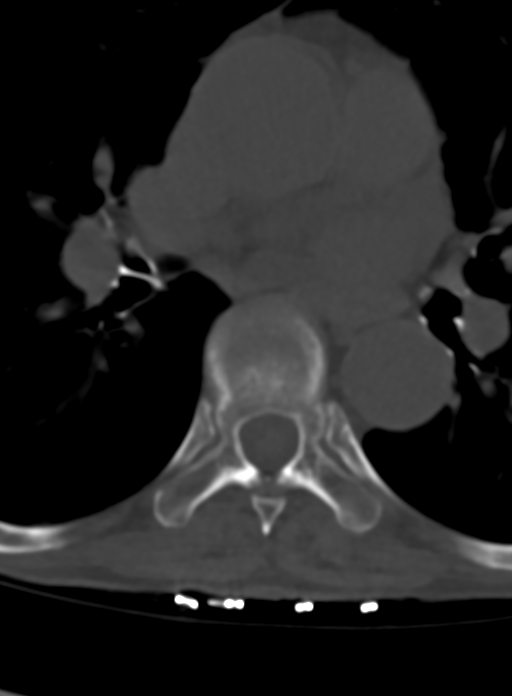
[im 124/166  bone]
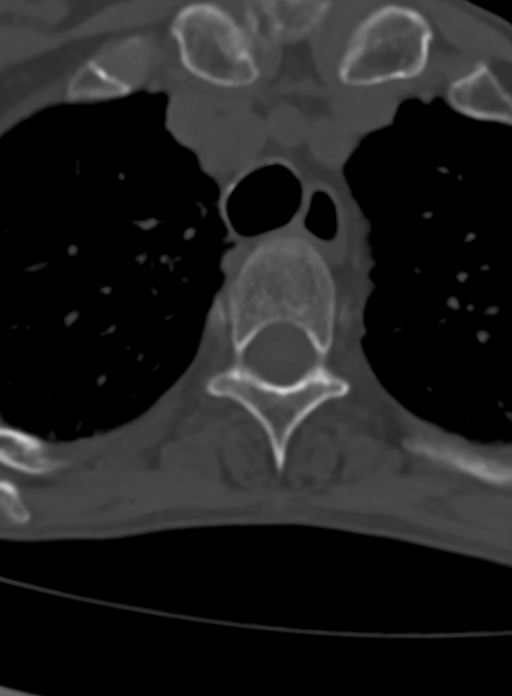

[Series 6: t-spine 2.00 br60 s3 · axial · 0.25mm/px · z∈[-975,-811]mm · 3 of 166 slices shown (1 of 2)]
[im 42/166  bone]
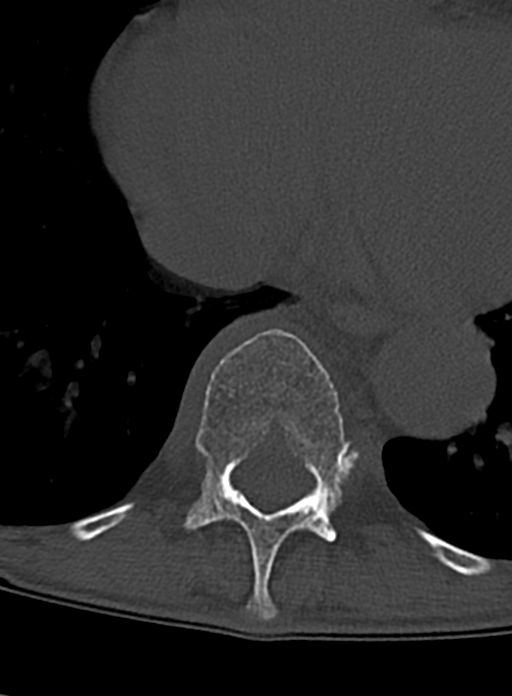
[im 83/166  bone]
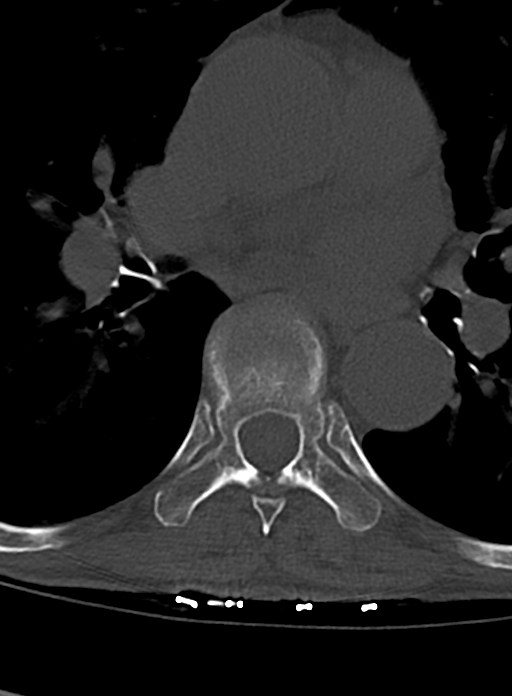
[im 124/166  bone]
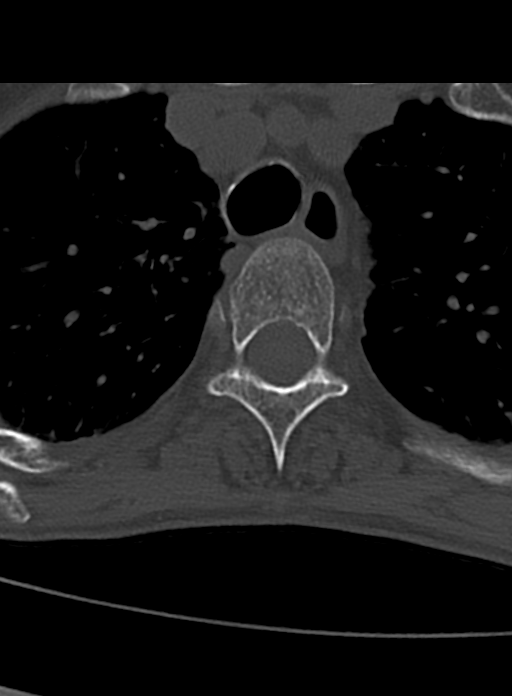

[Series 10: t-spine 2.00 br60 s3 · coronal · 0.25mm/px · 3 of 80 slices shown (2 of 2)]
[im 16/80  bone]
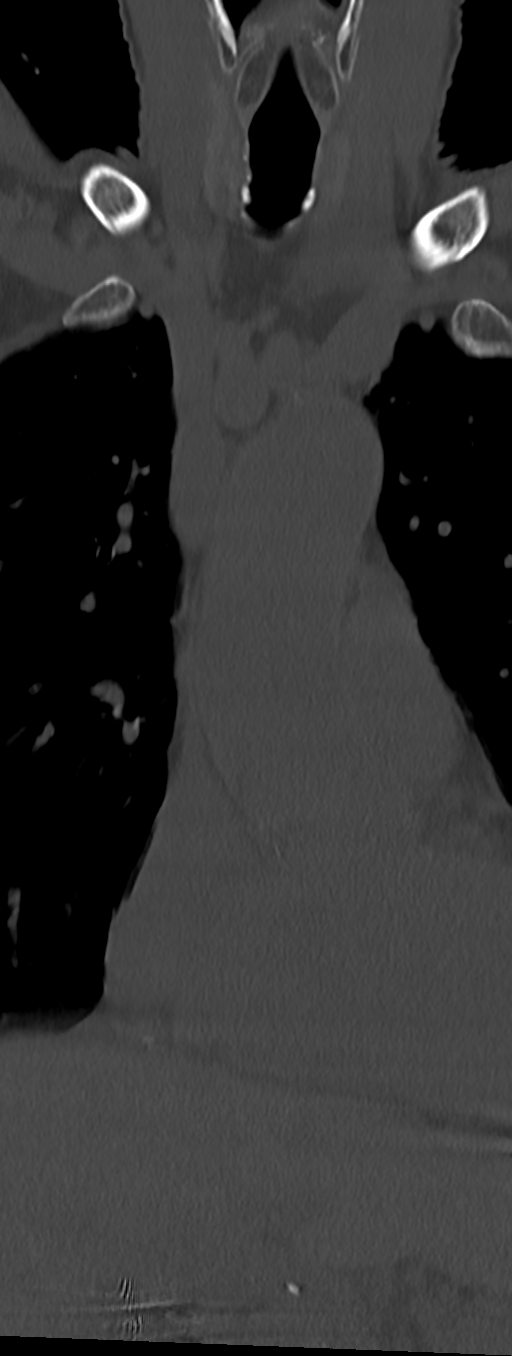
[im 32/80  bone]
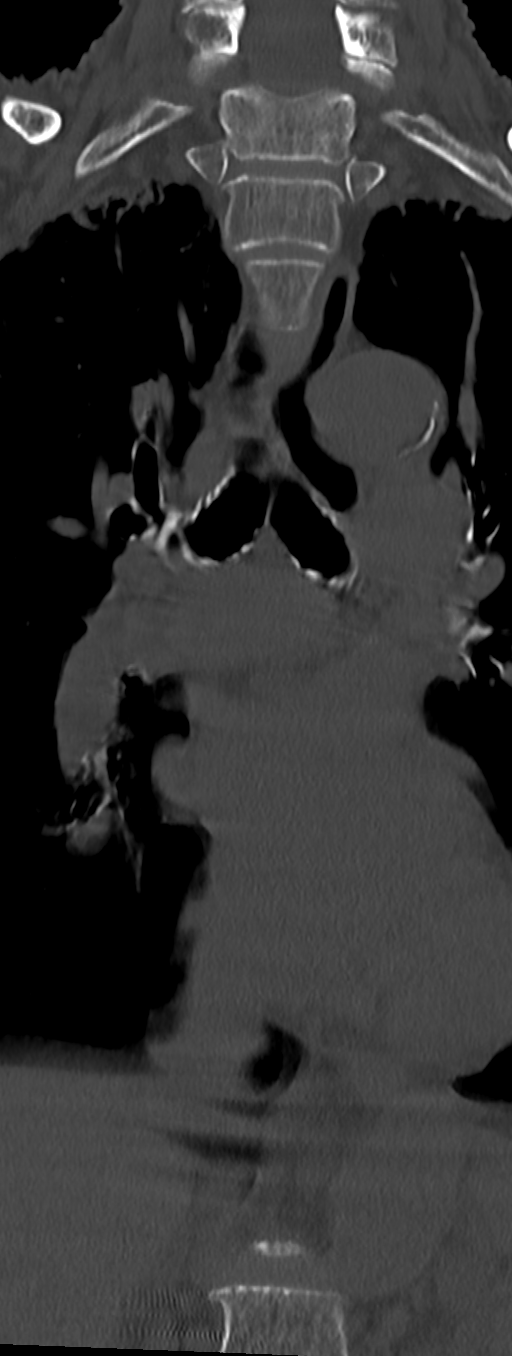
[im 48/80  bone]
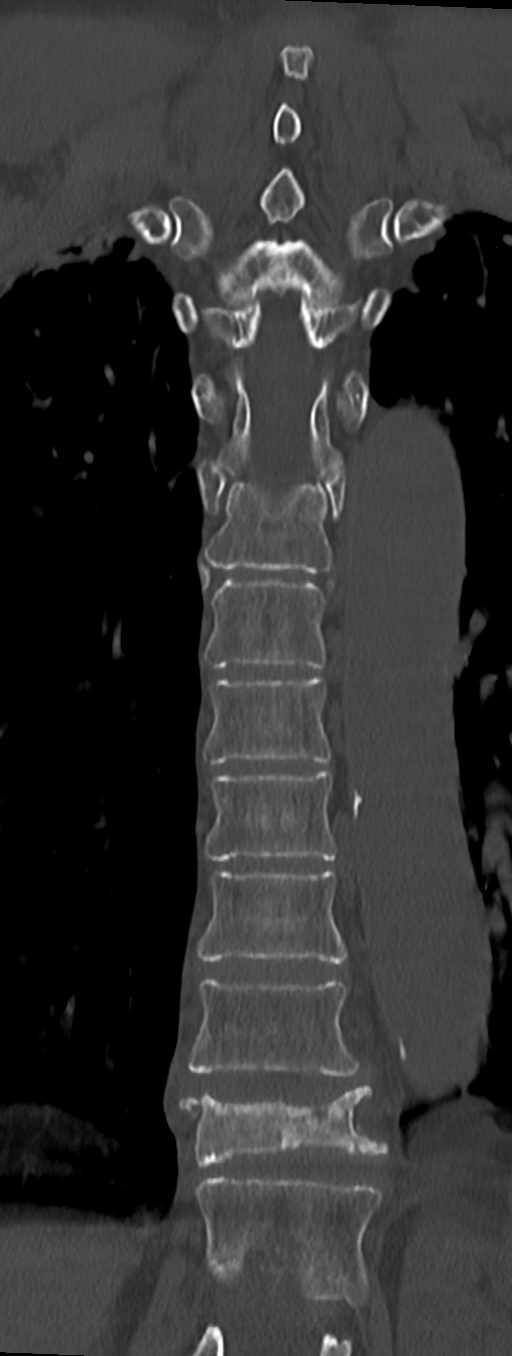

[9 of 33 positions shown; findings below may reference images not displayed]

FINDINGS: Alignment: 12 rib-bearing thoracic levels. Mild straightening of the
normal thoracic kyphosis in the midthoracic spine. There is
minimally widened though normally aligned facets at the T11-T12
articulations. Appearance is unchanged from comparison MR imaging.
No other abnormally widened, perched or jumped facets are seen.

Vertebrae: Redemonstration of an anterior wedging burst fracture
deformity the T12 level with fracture line extending predominantly
through superior endplate but with violation of the anterior,
posterior and inferior endplates as well. Maximal vertebral body
height loss of approximately 70% appears grossly unchanged from the
comparison MR imaging. Stable minimal posterior retropulsion
approximately 3 minutes mm with minimal bony narrowing of the canal
without demonstrable cord compression on comparison MRI imaging.

Paraspinal and other soft tissues: Minimal perivertebral soft tissue
thickening adjacent the T12 fracture compatible with a subacute
process. No other paravertebral fluid, swelling, gas or hemorrhage.

Included portions of the chest reveal some biapical
pleuroparenchymal scarring. Atherosclerotic calcification of the
thoracic aorta as well as some calcified hilar nodes which may
reflect sequela of prior granulomatous disease. Prior
cholecystectomy noted in the upper abdomen.

Disc levels: Minimal multilevel intervertebral disc height loss
without significant canal or foraminal impingement on a discogenic
or facet degenerative basis. Retropulsion of the superior endplate
by approximately 4 mm at the level of the T12 fracture results in
partial effacement of ventral thecal sac but without significant
resulting cord compression on the comparison MR imaging.
IMPRESSION: 1. Subacute burst fracture deformity of the T12 level with some
predominantly anterior wedging and vertebral body height loss of up
to 70%. Minimal posterior retropulsion of the superior endplate
resulting in effacement of the thecal sac but no cord compression on
the comparison MR imaging. Overall degree of height loss and
configuration is unchanged from comparison. Mild paravertebral
thickening compatible with a subacute process.
2. No other acute or conspicuous osseous abnormalities in the
thoracic spine.
3. Minimal degenerative change. No other significant canal or
foraminal impingement.

## 2022-10-27 DIAGNOSIS — F0390 Unspecified dementia without behavioral disturbance: Secondary | ICD-10-CM | POA: Diagnosis not present

## 2022-10-27 DIAGNOSIS — J309 Allergic rhinitis, unspecified: Secondary | ICD-10-CM | POA: Diagnosis not present

## 2022-10-27 DIAGNOSIS — F329 Major depressive disorder, single episode, unspecified: Secondary | ICD-10-CM | POA: Diagnosis not present

## 2022-10-27 DIAGNOSIS — F419 Anxiety disorder, unspecified: Secondary | ICD-10-CM | POA: Diagnosis not present

## 2022-11-03 DIAGNOSIS — D649 Anemia, unspecified: Secondary | ICD-10-CM | POA: Diagnosis not present

## 2022-11-03 DIAGNOSIS — I10 Essential (primary) hypertension: Secondary | ICD-10-CM | POA: Diagnosis not present

## 2022-11-03 DIAGNOSIS — F0390 Unspecified dementia without behavioral disturbance: Secondary | ICD-10-CM | POA: Diagnosis not present

## 2022-11-10 DIAGNOSIS — G309 Alzheimer's disease, unspecified: Secondary | ICD-10-CM | POA: Diagnosis not present

## 2022-11-10 DIAGNOSIS — M545 Low back pain, unspecified: Secondary | ICD-10-CM | POA: Diagnosis not present

## 2022-11-10 DIAGNOSIS — M6281 Muscle weakness (generalized): Secondary | ICD-10-CM | POA: Diagnosis not present

## 2022-11-10 DIAGNOSIS — R627 Adult failure to thrive: Secondary | ICD-10-CM | POA: Diagnosis not present

## 2022-11-10 DIAGNOSIS — R269 Unspecified abnormalities of gait and mobility: Secondary | ICD-10-CM | POA: Diagnosis not present

## 2022-11-16 DIAGNOSIS — M545 Low back pain, unspecified: Secondary | ICD-10-CM | POA: Diagnosis not present

## 2022-11-16 DIAGNOSIS — R627 Adult failure to thrive: Secondary | ICD-10-CM | POA: Diagnosis not present

## 2022-11-16 DIAGNOSIS — M6281 Muscle weakness (generalized): Secondary | ICD-10-CM | POA: Diagnosis not present

## 2022-11-16 DIAGNOSIS — R269 Unspecified abnormalities of gait and mobility: Secondary | ICD-10-CM | POA: Diagnosis not present

## 2022-11-16 DIAGNOSIS — G309 Alzheimer's disease, unspecified: Secondary | ICD-10-CM | POA: Diagnosis not present

## 2022-11-19 DIAGNOSIS — G309 Alzheimer's disease, unspecified: Secondary | ICD-10-CM | POA: Diagnosis not present

## 2022-11-19 DIAGNOSIS — M6281 Muscle weakness (generalized): Secondary | ICD-10-CM | POA: Diagnosis not present

## 2022-11-19 DIAGNOSIS — R269 Unspecified abnormalities of gait and mobility: Secondary | ICD-10-CM | POA: Diagnosis not present

## 2022-11-19 DIAGNOSIS — M545 Low back pain, unspecified: Secondary | ICD-10-CM | POA: Diagnosis not present

## 2022-11-19 DIAGNOSIS — R627 Adult failure to thrive: Secondary | ICD-10-CM | POA: Diagnosis not present

## 2022-11-22 DIAGNOSIS — M6281 Muscle weakness (generalized): Secondary | ICD-10-CM | POA: Diagnosis not present

## 2022-11-22 DIAGNOSIS — G309 Alzheimer's disease, unspecified: Secondary | ICD-10-CM | POA: Diagnosis not present

## 2022-11-22 DIAGNOSIS — M545 Low back pain, unspecified: Secondary | ICD-10-CM | POA: Diagnosis not present

## 2022-11-22 DIAGNOSIS — R627 Adult failure to thrive: Secondary | ICD-10-CM | POA: Diagnosis not present

## 2022-11-22 DIAGNOSIS — R269 Unspecified abnormalities of gait and mobility: Secondary | ICD-10-CM | POA: Diagnosis not present

## 2022-11-23 DIAGNOSIS — M6281 Muscle weakness (generalized): Secondary | ICD-10-CM | POA: Diagnosis not present

## 2022-11-23 DIAGNOSIS — R269 Unspecified abnormalities of gait and mobility: Secondary | ICD-10-CM | POA: Diagnosis not present

## 2022-11-23 DIAGNOSIS — M545 Low back pain, unspecified: Secondary | ICD-10-CM | POA: Diagnosis not present

## 2022-11-23 DIAGNOSIS — G309 Alzheimer's disease, unspecified: Secondary | ICD-10-CM | POA: Diagnosis not present

## 2022-11-23 DIAGNOSIS — R627 Adult failure to thrive: Secondary | ICD-10-CM | POA: Diagnosis not present

## 2022-11-26 DIAGNOSIS — M6281 Muscle weakness (generalized): Secondary | ICD-10-CM | POA: Diagnosis not present

## 2022-11-26 DIAGNOSIS — R627 Adult failure to thrive: Secondary | ICD-10-CM | POA: Diagnosis not present

## 2022-11-26 DIAGNOSIS — M545 Low back pain, unspecified: Secondary | ICD-10-CM | POA: Diagnosis not present

## 2022-11-26 DIAGNOSIS — R269 Unspecified abnormalities of gait and mobility: Secondary | ICD-10-CM | POA: Diagnosis not present

## 2022-11-26 DIAGNOSIS — G309 Alzheimer's disease, unspecified: Secondary | ICD-10-CM | POA: Diagnosis not present

## 2022-11-30 DIAGNOSIS — M6281 Muscle weakness (generalized): Secondary | ICD-10-CM | POA: Diagnosis not present

## 2022-11-30 DIAGNOSIS — M545 Low back pain, unspecified: Secondary | ICD-10-CM | POA: Diagnosis not present

## 2022-11-30 DIAGNOSIS — R269 Unspecified abnormalities of gait and mobility: Secondary | ICD-10-CM | POA: Diagnosis not present

## 2022-11-30 DIAGNOSIS — R627 Adult failure to thrive: Secondary | ICD-10-CM | POA: Diagnosis not present

## 2022-11-30 DIAGNOSIS — G309 Alzheimer's disease, unspecified: Secondary | ICD-10-CM | POA: Diagnosis not present

## 2022-12-03 DIAGNOSIS — M6281 Muscle weakness (generalized): Secondary | ICD-10-CM | POA: Diagnosis not present

## 2022-12-03 DIAGNOSIS — R627 Adult failure to thrive: Secondary | ICD-10-CM | POA: Diagnosis not present

## 2022-12-03 DIAGNOSIS — M545 Low back pain, unspecified: Secondary | ICD-10-CM | POA: Diagnosis not present

## 2022-12-03 DIAGNOSIS — R269 Unspecified abnormalities of gait and mobility: Secondary | ICD-10-CM | POA: Diagnosis not present

## 2022-12-03 DIAGNOSIS — G309 Alzheimer's disease, unspecified: Secondary | ICD-10-CM | POA: Diagnosis not present

## 2022-12-22 DIAGNOSIS — F419 Anxiety disorder, unspecified: Secondary | ICD-10-CM | POA: Diagnosis not present

## 2022-12-22 DIAGNOSIS — F329 Major depressive disorder, single episode, unspecified: Secondary | ICD-10-CM | POA: Diagnosis not present

## 2022-12-22 DIAGNOSIS — R350 Frequency of micturition: Secondary | ICD-10-CM | POA: Diagnosis not present

## 2022-12-22 DIAGNOSIS — F0392 Unspecified dementia, unspecified severity, with psychotic disturbance: Secondary | ICD-10-CM | POA: Diagnosis not present

## 2022-12-23 DIAGNOSIS — B351 Tinea unguium: Secondary | ICD-10-CM | POA: Diagnosis not present

## 2022-12-23 DIAGNOSIS — L84 Corns and callosities: Secondary | ICD-10-CM | POA: Diagnosis not present

## 2022-12-23 DIAGNOSIS — L603 Nail dystrophy: Secondary | ICD-10-CM | POA: Diagnosis not present

## 2022-12-23 DIAGNOSIS — I70203 Unspecified atherosclerosis of native arteries of extremities, bilateral legs: Secondary | ICD-10-CM | POA: Diagnosis not present

## 2023-01-11 DIAGNOSIS — M6281 Muscle weakness (generalized): Secondary | ICD-10-CM | POA: Diagnosis not present

## 2023-01-11 DIAGNOSIS — R2689 Other abnormalities of gait and mobility: Secondary | ICD-10-CM | POA: Diagnosis not present

## 2023-01-13 DIAGNOSIS — R2689 Other abnormalities of gait and mobility: Secondary | ICD-10-CM | POA: Diagnosis not present

## 2023-01-13 DIAGNOSIS — M6281 Muscle weakness (generalized): Secondary | ICD-10-CM | POA: Diagnosis not present

## 2023-01-18 DIAGNOSIS — M6281 Muscle weakness (generalized): Secondary | ICD-10-CM | POA: Diagnosis not present

## 2023-01-18 DIAGNOSIS — R2689 Other abnormalities of gait and mobility: Secondary | ICD-10-CM | POA: Diagnosis not present

## 2023-01-19 DIAGNOSIS — G309 Alzheimer's disease, unspecified: Secondary | ICD-10-CM | POA: Diagnosis not present

## 2023-01-19 DIAGNOSIS — I10 Essential (primary) hypertension: Secondary | ICD-10-CM | POA: Diagnosis not present

## 2023-01-19 DIAGNOSIS — F0280 Dementia in other diseases classified elsewhere without behavioral disturbance: Secondary | ICD-10-CM | POA: Diagnosis not present

## 2023-01-19 DIAGNOSIS — M6281 Muscle weakness (generalized): Secondary | ICD-10-CM | POA: Diagnosis not present

## 2023-01-19 DIAGNOSIS — Z79899 Other long term (current) drug therapy: Secondary | ICD-10-CM | POA: Diagnosis not present

## 2023-01-19 DIAGNOSIS — R2689 Other abnormalities of gait and mobility: Secondary | ICD-10-CM | POA: Diagnosis not present

## 2023-01-20 DIAGNOSIS — R2689 Other abnormalities of gait and mobility: Secondary | ICD-10-CM | POA: Diagnosis not present

## 2023-01-20 DIAGNOSIS — M6281 Muscle weakness (generalized): Secondary | ICD-10-CM | POA: Diagnosis not present

## 2023-01-24 DIAGNOSIS — M6281 Muscle weakness (generalized): Secondary | ICD-10-CM | POA: Diagnosis not present

## 2023-01-24 DIAGNOSIS — R2689 Other abnormalities of gait and mobility: Secondary | ICD-10-CM | POA: Diagnosis not present

## 2023-01-25 DIAGNOSIS — R2689 Other abnormalities of gait and mobility: Secondary | ICD-10-CM | POA: Diagnosis not present

## 2023-01-25 DIAGNOSIS — M6281 Muscle weakness (generalized): Secondary | ICD-10-CM | POA: Diagnosis not present

## 2023-01-28 DIAGNOSIS — M6281 Muscle weakness (generalized): Secondary | ICD-10-CM | POA: Diagnosis not present

## 2023-01-28 DIAGNOSIS — R2689 Other abnormalities of gait and mobility: Secondary | ICD-10-CM | POA: Diagnosis not present

## 2023-02-02 DIAGNOSIS — R2689 Other abnormalities of gait and mobility: Secondary | ICD-10-CM | POA: Diagnosis not present

## 2023-02-02 DIAGNOSIS — M6281 Muscle weakness (generalized): Secondary | ICD-10-CM | POA: Diagnosis not present

## 2023-02-04 DIAGNOSIS — R2689 Other abnormalities of gait and mobility: Secondary | ICD-10-CM | POA: Diagnosis not present

## 2023-02-04 DIAGNOSIS — M6281 Muscle weakness (generalized): Secondary | ICD-10-CM | POA: Diagnosis not present
